# Patient Record
Sex: Female | Born: 1980 | Race: Black or African American | Hispanic: No | Marital: Single | Smoking: Never smoker
Health system: Southern US, Community
[De-identification: ages and names within clinical notes are randomized; demographics above are authoritative.]

## PROBLEM LIST (undated history)

## (undated) DIAGNOSIS — R569 Unspecified convulsions: Secondary | ICD-10-CM

## (undated) DIAGNOSIS — B20 Human immunodeficiency virus [HIV] disease: Secondary | ICD-10-CM

## (undated) DIAGNOSIS — K611 Rectal abscess: Secondary | ICD-10-CM

## (undated) DIAGNOSIS — N1330 Unspecified hydronephrosis: Secondary | ICD-10-CM

## (undated) DIAGNOSIS — Z5189 Encounter for other specified aftercare: Secondary | ICD-10-CM

## (undated) HISTORY — PX: OTHER SURGICAL HISTORY: SHX169

## (undated) HISTORY — DX: Human immunodeficiency virus (HIV) disease: B20

## (undated) HISTORY — DX: Encounter for other specified aftercare: Z51.89

## (undated) HISTORY — DX: Unspecified hydronephrosis: N13.30

## (undated) HISTORY — DX: Rectal abscess: K61.1

## (undated) HISTORY — DX: Unspecified convulsions: R56.9

---

## 2019-04-04 ENCOUNTER — Emergency Department (HOSPITAL_COMMUNITY)
Admission: EM | Admit: 2019-04-04 | Discharge: 2019-04-05 | Disposition: A | Discharge status: Home / Self Care | Payer: No Typology Code available for payment source | Attending: Emergency Medicine | Admitting: Emergency Medicine

## 2019-04-04 ENCOUNTER — Encounter (HOSPITAL_COMMUNITY): Payer: Self-pay

## 2019-04-04 ENCOUNTER — Other Ambulatory Visit: Payer: Self-pay

## 2019-04-04 DIAGNOSIS — Y9389 Activity, other specified: Secondary | ICD-10-CM | POA: Insufficient documentation

## 2019-04-04 DIAGNOSIS — Y9289 Other specified places as the place of occurrence of the external cause: Secondary | ICD-10-CM | POA: Insufficient documentation

## 2019-04-04 DIAGNOSIS — S0502XA Injury of conjunctiva and corneal abrasion without foreign body, left eye, initial encounter: Secondary | ICD-10-CM | POA: Diagnosis not present

## 2019-04-04 DIAGNOSIS — W208XXA Other cause of strike by thrown, projected or falling object, initial encounter: Secondary | ICD-10-CM | POA: Insufficient documentation

## 2019-04-04 DIAGNOSIS — Y990 Civilian activity done for income or pay: Secondary | ICD-10-CM | POA: Diagnosis not present

## 2019-04-04 DIAGNOSIS — S0592XA Unspecified injury of left eye and orbit, initial encounter: Secondary | ICD-10-CM | POA: Diagnosis present

## 2019-04-04 NOTE — ED Triage Notes (Signed)
Pt complains of an eye injury at work, this is a workers comp case Pt states a box fell on her left eye

## 2019-04-05 MED ORDER — ERYTHROMYCIN 5 MG/GM OP OINT: TOPICAL_OINTMENT | OPHTHALMIC | 0 refills | Status: DC

## 2019-04-05 MED ORDER — TETRACAINE HCL 0.5 % OP SOLN
1.0000 [drp] | Freq: Once | OPHTHALMIC | Status: AC
  Administered 2019-04-05: 1 [drp] via OPHTHALMIC
  Filled 2019-04-05: qty 4

## 2019-04-05 MED ORDER — FLUORESCEIN SODIUM 1 MG OP STRP
1.0000 | ORAL_STRIP | Freq: Once | OPHTHALMIC | Status: AC
  Administered 2019-04-05: 1 via OPHTHALMIC
  Filled 2019-04-05: qty 1

## 2019-04-05 NOTE — Discharge Instructions (Signed)
Use the drops as prescribed. Follow up with Opthalmology in 3 days for reevaluation. Return for new or worsening symptoms.

## 2019-04-05 NOTE — ED Provider Notes (Signed)
[facility] DEPT Provider Note   CSN: [id number] Arrival date & time: 04/04/19  2318    History   Chief Complaint Eye injury  HPI Candace Jones is a 38 y.o. female with no significant past medical record who presents to evaluation of eye injury. Reached up at work and a 5 lb box fell onto her left head/eye.  Patient states since then she has had a scratchy sensation to her left eye.  Injury occurred on Monday, 4 days PTA.  She denies any decreased vision.  She has had some eye tearing.  She does not wear contacts.  She does wear occasional corrective lenses.  Denies sensation of curtain in eye, erythema, pruritic discharge, double vision, HA, facial droop, facial pain, neck pain, neck stiffness.  Denies additional aggravating or alleviating factors. Tdap up to date.  History obtained from patient and past medical records. [employer] [place] interpretor was used.     HPI  History reviewed. No pertinent past medical history.  There are no active problems to display for this patient.   History reviewed. No pertinent surgical history.   OB History   No obstetric history on file.      Home Medications    Prior to Admission medications   Medication Sig Start Date End Date Taking? Authorizing Provider  erythromycin ophthalmic ointment Place a 1/2 inch ribbon of ointment into the LEFT lower eyelid. 04/05/19   Henderly, Britni A, PA-C    Family History History reviewed. No pertinent family history.  Social History Social History   Tobacco Use  . Smoking status: Never Smoker  . Smokeless tobacco: Never Used  Substance Use Topics  . Alcohol use: Never    Frequency: Never  . Drug use: Never     Allergies   Patient has no known allergies.   Review of Systems Review of Systems  Constitutional: Negative.   HENT: Negative.   Eyes: Positive for pain and discharge.  Respiratory: Negative.   Cardiovascular: Negative.   Gastrointestinal:  Negative.   Genitourinary: Negative.   Musculoskeletal: Negative.   Skin: Negative.   Neurological: Negative.   All other systems reviewed and are negative.    Physical Exam Updated Vital Signs BP (!) 138/97   Pulse 69   Temp 98.8 F (37.1 C) (Oral)   Resp 18   Ht 5' 8.11" (1.73 m)   Wt 104.3 kg   LMP 03/26/2019   SpO2 98%   BMI 34.86 kg/m   Physical Exam Vitals signs and nursing note reviewed.  Constitutional:      General: She is not in acute distress.    Appearance: She is well-developed. She is not ill-appearing or toxic-appearing.  HENT:     Head: Normocephalic and atraumatic. No raccoon eyes, Battle's sign, contusion, right periorbital erythema or left periorbital erythema.     Jaw: There is normal jaw occlusion.     Comments: Facial bones nontender to palpation.  No facial crepitus.    Mouth/Throat:     Lips: Pink.     Pharynx: Oropharynx is clear. Uvula midline.  Eyes:     General: Lids are normal. Lids are everted, no foreign bodies appreciated. Vision grossly intact. No allergic shiner, visual field deficit or scleral icterus.       Right eye: No foreign body, discharge or hordeolum.        Left eye: No foreign body, discharge or hordeolum.     Intraocular pressure: Right eye pressure is 14 mmHg.  Left eye pressure is 14 mmHg.     Extraocular Movements: Extraocular movements intact.     Conjunctiva/sclera:     Right eye: Right conjunctiva is not injected. No chemosis, exudate or hemorrhage.    Left eye: Left conjunctiva is not injected. No chemosis, exudate or hemorrhage.    Pupils: Pupils are equal, round, and reactive to light.     Comments: Small, 3 mm corneal abrasion to the 5 o'clock position on iris. No overlying skin changes to orbits/face. No pain with EOM.  Neck:     Musculoskeletal: Normal range of motion.  Cardiovascular:     Rate and Rhythm: Normal rate.  Pulmonary:     Effort: No respiratory distress.  Abdominal:     General: There is no  distension.  Musculoskeletal: Normal range of motion.  Skin:    General: Skin is warm and dry.  Neurological:     Mental Status: She is alert.    ED Treatments / Results  Labs (all labs ordered are listed, but only abnormal results are displayed) Labs Reviewed - No data to display  EKG None  Radiology No results found.  Procedures Procedures (including critical care time)  Medications Ordered in ED Medications  tetracaine (PONTOCAINE) 0.5 % ophthalmic solution 1 drop (has no administration in time range)  fluorescein ophthalmic strip 1 strip (has no administration in time range)   Initial Impression / Assessment and Plan / ED Course  I have reviewed the triage vital signs and the nursing notes.  Pertinent labs & imaging results that were available during my care of the patient were reviewed by me and considered in my medical decision making (see chart for details).  38 year old presents for evaluation of eye injury which occurred 4 days PTA.  Vision intact.  Exam consistent with detached retina.  She does have a small corneal abrasion on PE. No facial bony pain or stepoffs. Do not think patient needs CT max face at this time.  Pt with corneal abrasion on PE. Tdap up to date. Eye irrigated w NS, no evidence of FB.  No change in vision, acuity equal bilaterally.  Pt is not a contact lens wearer.  Exam non-concerning for orbital cellulitis, hyphema, corneal ulcers. Patient will be discharged home with erythromycin.   Patient understands to follow up with ophthalmology, & to return to ER if new symptoms develop including change in vision, purulent drainage, or entrapment.  The patient has been appropriately medically screened and/or stabilized in the ED. I have low suspicion for any other emergent medical condition which would require further screening, evaluation or treatment in the ED or require inpatient management.  Patient is hemodynamically stable and in no acute distress.   Patient able to ambulate in department prior to ED.  Evaluation does not show acute pathology that would require ongoing or additional emergent interventions while in the emergency department or further inpatient treatment.  I have discussed the diagnosis with the patient and answered all questions.  Pain is been managed while in the emergency department and patient has no further complaints prior to discharge.  Patient is comfortable with plan discussed in room and is stable for discharge at this time.  I have discussed strict return precautions for returning to the emergency department.  Patient was encouraged to follow-up with PCP/specialist refer to at discharge.      Final Clinical Impressions(s) / ED Diagnoses   Final diagnoses:  Abrasion of left cornea, initial encounter  ED Discharge Orders         Ordered    erythromycin ophthalmic ointment     04/05/19 0117           Henderly, Britni A, PA-C 04/05/19 0121    Palumbo, April, MD 04/05/19 0139

## 2019-09-17 ENCOUNTER — Emergency Department (HOSPITAL_COMMUNITY): Payer: 59

## 2019-09-17 ENCOUNTER — Emergency Department (HOSPITAL_COMMUNITY)
Admission: EM | Admit: 2019-09-17 | Discharge: 2019-09-17 | Disposition: A | Discharge status: Home / Self Care | Payer: 59 | Attending: Emergency Medicine | Admitting: Emergency Medicine

## 2019-09-17 ENCOUNTER — Encounter (HOSPITAL_COMMUNITY): Payer: Self-pay

## 2019-09-17 ENCOUNTER — Other Ambulatory Visit: Payer: Self-pay

## 2019-09-17 DIAGNOSIS — M25571 Pain in right ankle and joints of right foot: Secondary | ICD-10-CM

## 2019-09-17 DIAGNOSIS — M25511 Pain in right shoulder: Secondary | ICD-10-CM | POA: Diagnosis present

## 2019-09-17 MED ORDER — METHOCARBAMOL 500 MG PO TABS: 500.0000 mg | ORAL_TABLET | Freq: Two times a day (BID) | ORAL | 0 refills | Status: DC

## 2019-09-17 MED ORDER — NAPROXEN 500 MG PO TABS: 500.0000 mg | ORAL_TABLET | Freq: Two times a day (BID) | ORAL | 0 refills | Status: DC

## 2019-09-17 NOTE — Discharge Instructions (Addendum)
Your shoulder xray did not show any abnormalities.   Your ankle xray was normal.  Your foot xray does show some likely chronic changes however they cannot fully rule out a new small fracture. This is not specifically where you are having pain and may just be an incidental finding from an old injury. I would recommend following up with orthopedist Dr. Lucia Gaskins for further evaluation if you continue to have pain in 2-3 weeks.   I have prescribed medication for you to take. Please take to any pharmacy to have it filled. I would not recommend driving while taking the Methocarbamol (Robaxin) as it can make you drowsy. You should take the Naproxen during the day and the Robaxin at nighttime to help you sleep. Your pain should begin to improve over the next couple of days.   Return to the ED for any worsening symptoms including worsening pain, inability to walk on your foot, or any other concerning symptoms.
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
## 2019-09-17 NOTE — ED Notes (Signed)
Pt verbalizes understanding of DC instructions. Pt belongings returned and is ambulatory out of ED.   Interpreter used. Pt placed in room with her other daughter whose visit  is still in process.
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
## 2019-09-17 NOTE — ED Notes (Signed)
Pt placed back in process awaiting ortho consult

## 2020-09-29 ENCOUNTER — Inpatient Hospital Stay (HOSPITAL_COMMUNITY)
Admission: EM | Admit: 2020-09-29 | Discharge: 2020-10-10 | DRG: 969 | Disposition: A | Discharge status: Home / Self Care | Payer: Medicaid Other | Attending: Internal Medicine | Admitting: Internal Medicine

## 2020-09-29 ENCOUNTER — Emergency Department (HOSPITAL_COMMUNITY): Payer: Medicaid Other

## 2020-09-29 ENCOUNTER — Encounter (HOSPITAL_COMMUNITY): Payer: Self-pay | Admitting: Internal Medicine

## 2020-09-29 ENCOUNTER — Observation Stay (HOSPITAL_COMMUNITY): Payer: Medicaid Other

## 2020-09-29 DIAGNOSIS — B589 Toxoplasmosis, unspecified: Principal | ICD-10-CM | POA: Diagnosis present

## 2020-09-29 DIAGNOSIS — R21 Rash and other nonspecific skin eruption: Secondary | ICD-10-CM | POA: Diagnosis present

## 2020-09-29 DIAGNOSIS — K644 Residual hemorrhoidal skin tags: Secondary | ICD-10-CM | POA: Diagnosis present

## 2020-09-29 DIAGNOSIS — G9389 Other specified disorders of brain: Secondary | ICD-10-CM

## 2020-09-29 DIAGNOSIS — G8191 Hemiplegia, unspecified affecting right dominant side: Secondary | ICD-10-CM | POA: Diagnosis present

## 2020-09-29 DIAGNOSIS — L299 Pruritus, unspecified: Secondary | ICD-10-CM | POA: Diagnosis present

## 2020-09-29 DIAGNOSIS — Z6830 Body mass index (BMI) 30.0-30.9, adult: Secondary | ICD-10-CM

## 2020-09-29 DIAGNOSIS — R29810 Facial weakness: Secondary | ICD-10-CM | POA: Diagnosis present

## 2020-09-29 DIAGNOSIS — R569 Unspecified convulsions: Secondary | ICD-10-CM

## 2020-09-29 DIAGNOSIS — Z21 Asymptomatic human immunodeficiency virus [HIV] infection status: Secondary | ICD-10-CM

## 2020-09-29 DIAGNOSIS — R102 Pelvic and perineal pain: Secondary | ICD-10-CM

## 2020-09-29 DIAGNOSIS — N920 Excessive and frequent menstruation with regular cycle: Secondary | ICD-10-CM | POA: Diagnosis present

## 2020-09-29 DIAGNOSIS — B582 Toxoplasma meningoencephalitis: Secondary | ICD-10-CM | POA: Diagnosis present

## 2020-09-29 DIAGNOSIS — W19XXXA Unspecified fall, initial encounter: Secondary | ICD-10-CM | POA: Diagnosis present

## 2020-09-29 DIAGNOSIS — Z791 Long term (current) use of non-steroidal anti-inflammatories (NSAID): Secondary | ICD-10-CM

## 2020-09-29 DIAGNOSIS — D496 Neoplasm of unspecified behavior of brain: Secondary | ICD-10-CM | POA: Diagnosis present

## 2020-09-29 DIAGNOSIS — Z79899 Other long term (current) drug therapy: Secondary | ICD-10-CM

## 2020-09-29 DIAGNOSIS — G9341 Metabolic encephalopathy: Secondary | ICD-10-CM | POA: Diagnosis present

## 2020-09-29 DIAGNOSIS — E669 Obesity, unspecified: Secondary | ICD-10-CM | POA: Diagnosis present

## 2020-09-29 DIAGNOSIS — R900 Intracranial space-occupying lesion found on diagnostic imaging of central nervous system: Secondary | ICD-10-CM

## 2020-09-29 DIAGNOSIS — Z20822 Contact with and (suspected) exposure to covid-19: Secondary | ICD-10-CM | POA: Diagnosis present

## 2020-09-29 DIAGNOSIS — Z5309 Procedure and treatment not carried out because of other contraindication: Secondary | ICD-10-CM

## 2020-09-29 DIAGNOSIS — K6289 Other specified diseases of anus and rectum: Secondary | ICD-10-CM

## 2020-09-29 DIAGNOSIS — B20 Human immunodeficiency virus [HIV] disease: Secondary | ICD-10-CM | POA: Diagnosis present

## 2020-09-29 DIAGNOSIS — K611 Rectal abscess: Secondary | ICD-10-CM

## 2020-09-29 DIAGNOSIS — B370 Candidal stomatitis: Secondary | ICD-10-CM | POA: Diagnosis present

## 2020-09-29 DIAGNOSIS — M25579 Pain in unspecified ankle and joints of unspecified foot: Secondary | ICD-10-CM

## 2020-09-29 DIAGNOSIS — R001 Bradycardia, unspecified: Secondary | ICD-10-CM | POA: Diagnosis present

## 2020-09-29 DIAGNOSIS — E876 Hypokalemia: Secondary | ICD-10-CM | POA: Diagnosis present

## 2020-09-29 DIAGNOSIS — N858 Other specified noninflammatory disorders of uterus: Secondary | ICD-10-CM

## 2020-09-29 DIAGNOSIS — G936 Cerebral edema: Secondary | ICD-10-CM | POA: Diagnosis present

## 2020-09-29 DIAGNOSIS — M25571 Pain in right ankle and joints of right foot: Secondary | ICD-10-CM | POA: Diagnosis present

## 2020-09-29 DIAGNOSIS — D259 Leiomyoma of uterus, unspecified: Secondary | ICD-10-CM | POA: Diagnosis present

## 2020-09-29 DIAGNOSIS — R471 Dysarthria and anarthria: Secondary | ICD-10-CM | POA: Diagnosis present

## 2020-09-29 DIAGNOSIS — D509 Iron deficiency anemia, unspecified: Secondary | ICD-10-CM | POA: Diagnosis present

## 2020-09-29 DIAGNOSIS — R4701 Aphasia: Secondary | ICD-10-CM | POA: Diagnosis present

## 2020-09-29 LAB — CBC
HCT: 24.3 % — ABNORMAL LOW (ref 36.0–46.0)
Hemoglobin: 6.6 g/dL — CL (ref 12.0–15.0)
MCH: 16.7 pg — ABNORMAL LOW (ref 26.0–34.0)
MCHC: 27.2 g/dL — ABNORMAL LOW (ref 30.0–36.0)
MCV: 61.5 fL — ABNORMAL LOW (ref 80.0–100.0)
Platelets: 213 10*3/uL (ref 150–400)
RBC: 3.95 MIL/uL (ref 3.87–5.11)
RDW: 23.7 % — ABNORMAL HIGH (ref 11.5–15.5)
WBC: 4.1 10*3/uL (ref 4.0–10.5)
nRBC: 0 % (ref 0.0–0.2)

## 2020-09-29 LAB — COMPREHENSIVE METABOLIC PANEL
ALT: 27 U/L (ref 0–44)
AST: 41 U/L (ref 15–41)
Albumin: 3.6 g/dL (ref 3.5–5.0)
Alkaline Phosphatase: 66 U/L (ref 38–126)
Anion gap: 9 (ref 5–15)
BUN: 10 mg/dL (ref 6–20)
CO2: 19 mmol/L — ABNORMAL LOW (ref 22–32)
Calcium: 8.7 mg/dL — ABNORMAL LOW (ref 8.9–10.3)
Chloride: 105 mmol/L (ref 98–111)
Creatinine, Ser: 0.57 mg/dL (ref 0.44–1.00)
GFR, Estimated: >60 mL/min (ref >60)
Glucose, Bld: 114 mg/dL — ABNORMAL HIGH (ref 70–99)
Potassium: 3.5 mmol/L (ref 3.5–5.1)
Sodium: 133 mmol/L — ABNORMAL LOW (ref 135–145)
Total Bilirubin: 0.6 mg/dL (ref 0.3–1.2)
Total Protein: 9.7 g/dL — ABNORMAL HIGH (ref 6.5–8.1)

## 2020-09-29 LAB — DIFFERENTIAL
Abs Immature Granulocytes: 0.01 10*3/uL (ref 0.00–0.07)
Basophils Absolute: 0 10*3/uL (ref 0.0–0.1)
Basophils Relative: 1 %
Eosinophils Absolute: 0.3 10*3/uL (ref 0.0–0.5)
Eosinophils Relative: 6 %
Immature Granulocytes: 0 %
Lymphocytes Relative: 40 %
Lymphs Abs: 1.6 10*3/uL (ref 0.7–4.0)
Monocytes Absolute: 0.4 10*3/uL (ref 0.1–1.0)
Monocytes Relative: 10 %
Neutro Abs: 1.8 10*3/uL (ref 1.7–7.7)
Neutrophils Relative %: 43 %

## 2020-09-29 LAB — I-STAT CHEM 8, ED
BUN: 10 mg/dL (ref 6–20)
Calcium, Ion: 1.07 mmol/L — ABNORMAL LOW (ref 1.15–1.40)
Chloride: 106 mmol/L (ref 98–111)
Creatinine, Ser: 0.5 mg/dL (ref 0.44–1.00)
Glucose, Bld: 111 mg/dL — ABNORMAL HIGH (ref 70–99)
HCT: 25 % — ABNORMAL LOW (ref 36.0–46.0)
Hemoglobin: 8.5 g/dL — ABNORMAL LOW (ref 12.0–15.0)
Potassium: 3.5 mmol/L (ref 3.5–5.1)
Sodium: 139 mmol/L (ref 135–145)
TCO2: 21 mmol/L — ABNORMAL LOW (ref 22–32)

## 2020-09-29 LAB — CBG MONITORING, ED
Glucose-Capillary: 107 mg/dL — ABNORMAL HIGH (ref 70–99)
Glucose-Capillary: 90 mg/dL (ref 70–99)

## 2020-09-29 LAB — I-STAT BETA HCG BLOOD, ED (MC, WL, AP ONLY): I-stat hCG, quantitative: <5 m[IU]/mL (ref <5)

## 2020-09-29 LAB — APTT: aPTT: 23 seconds — ABNORMAL LOW (ref 24–36)

## 2020-09-29 LAB — PREPARE RBC (CROSSMATCH)

## 2020-09-29 LAB — ABO/RH: ABO/RH(D): A POS

## 2020-09-29 LAB — PROTIME-INR
INR: 1.1 (ref 0.8–1.2)
Prothrombin Time: 14.3 seconds (ref 11.4–15.2)

## 2020-09-29 MED ORDER — ACETAMINOPHEN 650 MG RE SUPP: 650.0000 mg | Freq: Four times a day (QID) | RECTAL | Status: DC | PRN

## 2020-09-29 MED ORDER — IOHEXOL 350 MG/ML SOLN
100.0000 mL | Freq: Once | INTRAVENOUS | Status: AC | PRN
  Administered 2020-09-29: 100 mL via INTRAVENOUS

## 2020-09-29 MED ORDER — DEXAMETHASONE SODIUM PHOSPHATE 4 MG/ML IJ SOLN: 4.0000 mg | Freq: Four times a day (QID) | INTRAMUSCULAR | Status: DC

## 2020-09-29 MED ORDER — ACETAMINOPHEN 325 MG PO TABS: 650.0000 mg | ORAL_TABLET | Freq: Four times a day (QID) | ORAL | Status: DC | PRN

## 2020-09-29 MED ORDER — IOHEXOL 300 MG/ML  SOLN
70.0000 mL | Freq: Once | INTRAMUSCULAR | Status: AC | PRN
  Administered 2020-09-29: 70 mL via INTRAVENOUS

## 2020-09-29 MED ORDER — LEVETIRACETAM IN NACL 1500 MG/100ML IV SOLN
1500.0000 mg | Freq: Once | INTRAVENOUS | Status: AC
  Administered 2020-09-29: 1500 mg via INTRAVENOUS
  Filled 2020-09-29: qty 100

## 2020-09-29 MED ORDER — SODIUM CHLORIDE 0.9% FLUSH
3.0000 mL | Freq: Once | INTRAVENOUS | Status: AC
Start: 2020-09-29 — End: 2020-10-06
  Administered 2020-10-06: 3 mL via INTRAVENOUS

## 2020-09-29 MED ORDER — DEXAMETHASONE SODIUM PHOSPHATE 10 MG/ML IJ SOLN
6.0000 mg | Freq: Four times a day (QID) | INTRAMUSCULAR | Status: AC
  Administered 2020-09-29 – 2020-09-30 (×4): 6 mg via INTRAVENOUS
  Filled 2020-09-29 (×2): qty 0.6
  Filled 2020-09-29: qty 1
  Filled 2020-09-29: qty 0.6

## 2020-09-29 MED ORDER — LORAZEPAM 2 MG/ML IJ SOLN
INTRAMUSCULAR | Status: AC
  Administered 2020-09-29: 2 mg
  Filled 2020-09-29: qty 1

## 2020-09-29 MED ORDER — SODIUM CHLORIDE 0.9% IV SOLUTION: Freq: Once | INTRAVENOUS | Status: AC

## 2020-09-29 MED ORDER — SODIUM CHLORIDE 0.9 % IV SOLN
750.0000 mg | Freq: Two times a day (BID) | INTRAVENOUS | Status: DC
  Administered 2020-09-30 – 2020-10-04 (×10): 750 mg via INTRAVENOUS
  Filled 2020-09-29 (×15): qty 7.5

## 2020-09-29 NOTE — ED Notes (Signed)
Dr. Kakrakandy at bedside. 

## 2020-09-29 NOTE — ED Notes (Addendum)
Pt is still disoriented and unable to verbalize answers. Per neuro assessment pt is unable to speak, tell me her name, year, etc. Pt daughter at bedside. Is able to speak some english but is not able to make health care decisions. No eligible person at bedside to help pt consent. POA is en route to hospital but is 4 hours away. Pt taken to xray.

## 2020-09-29 NOTE — Consult Note (Addendum)
Neurology Consultation  Reason for Consult: Aphasia Referring Physician: Dr. Dina Rich  CC: Aphasia  History is obtained from: EMS, Patient daughter  HPI: Candace Jones is a 40 y.o. French-speaking female with an unknown medical history who presents for evaluation of aphasia and right arm weakness. Per patient's daughter, Candace Jones went to [employer] at 16:45 today at baseline mental and functional status and arrived home at 17:05 with aphasia and unsteady, rightward leaning gait, and she was confused not able to state where she was. Daughter endorses that Candace Jones had complained of a headache approximately 2 weeks ago but she has not heard any complaints since. Candace Jones daughter also states that when her mother speaks, the words just do not make sense as she attempts to interpret for medical staff and states that her mother does have blood pressure problems but is unsure whether her blood pressures are normally high or low.  Daughter states that the patient is very private about her medical issues and although they live in the same home she is unsure if she takes any other medications or has any other medical problems.  Daughter does note that the patient occasionally takes some medication for headache  LKW: 16:45 tpa given?: no, CT head with concern for tumor IR Thrombectomy? No, low suspicion for LVO, mass / tumor identified on CTH Modified Rankin Scale: 0-Completely asymptomatic and back to baseline post- stroke  ROS: Unable to obtain due to altered mental status.   No past medical history on file.  No family history on file.  Social History:   reports that she has never smoked. She has never used smokeless tobacco. She reports that she does not drink alcohol and does not use drugs.  Medications  Current Facility-Administered Medications:  .  sodium chloride flush (NS) 0.9 % injection 3 mL, 3 mL, Intravenous, Once, Horton, Kristie M, DO  Current Outpatient Medications:  .   erythromycin ophthalmic ointment, Place a 1/2 inch ribbon of ointment into the LEFT lower eyelid., Disp: 1 g, Rfl: 0 .  methocarbamol (ROBAXIN) 500 MG tablet, Take 1 tablet (500 mg total) by mouth 2 (two) times daily., Disp: 20 tablet, Rfl: 0 .  naproxen (NAPROSYN) 500 MG tablet, Take 1 tablet (500 mg total) by mouth 2 (two) times daily., Disp: 30 tablet, Rfl: 0  Exam: Current vital signs: BP 128/82   Pulse 87   Temp 99.1 F (37.3 C) (Oral)   Resp 18   SpO2 100%  Vital signs in last 24 hours: Temp:  [99.1 F (37.3 C)] 99.1 F (37.3 C) (04/18 1852) Pulse Rate:  [87] 87 (04/18 1852) Resp:  [18] 18 (04/18 1852) BP: (128)/(82) 128/82 (04/18 1852) SpO2:  [100 %] 100 % (04/18 1852)  GENERAL: Awake, alert, in no acute distress Psych: Appears anxious during examination, cooperative with assessment Head: Normocephalic and atraumatic without obvious abnormality EENT: No OP obstruction, normal conjunctiv LUNGS: Normal respiratory effort. Non-labored breathing CV: Regular rate and rhythm on cardiac monitor ABDOMEN: Soft, non-tender, non-distended Ext: warm, well perfused, no obvious abnormality  NEURO:  Mental Status: Awake and alert. Does not answer orientation questions. Per patient's daughter and interpreter, patient does not provide any usable speech outside of "ouch, everything hurts" once. She does not answer name, place, time, age, month, or situation. Per interpreter, language is severely aphasic but is not described as dysarthric. She is unable to name objects. No neglect noted Cranial Nerves:  II: PERRL 6 mm/brisk. Attends to stimuli in all  visual fields III, IV, VI: EOMI. Lid elevation symmetric and full.  V: Sensation is intact to light touch and symmetrical to face. (Patient nods yes to symmetric feeling) VII: Right sided mouth droop present  VIII: Hearing is intact to voice IX, X: Palate elevation is symmetric. Hypophonic.  XI: Normal sternocleidomastoid and trapezius muscle  strength XII: Tongue protrudes midline without fasciculations.   Motor: 5/5 strength is all muscle groups with constant coaching. All extremities with antigravity movement and without vertical drift. Tone is normal. Bulk is normal.  Sensation: Withdrawals equally to noxious stimuli throughout. Does not provide information about sensation to light touch.  Coordination: FTN intact bilaterally. HKS intact bilaterally (assessment requires constant coaching) No pronator drift.  DTRs: 2+ and symmetric biceps, 3+ symmetric patellae Plantars: Left: up going Right: downgoing Gait: deferred  NIHSS: 1a Level of Conscious.: 0 1b LOC Questions: 2 1c LOC Commands: 0 2 Best Gaze: 0 3 Visual: 0 4 Facial Palsy: 1 5a Motor Arm - left: 0 5b Motor Arm - Right: 0 6a Motor Leg - Left: 0 6b Motor Leg - Right: 0 7 Limb Ataxia: 0 8 Sensory: 0 9 Best Language: 0 10 Dysarthria: 2 11 Extinct. and Inatten.: 0 TOTAL: 5  Labs I have reviewed labs in epic and the results pertinent to this consultation are: CBC    Component Value Date/Time   WBC 4.1 09/29/2020 1814   RBC 3.95 09/29/2020 1814   HGB 8.5 (L) 09/29/2020 1822   HCT 25.0 (L) 09/29/2020 1822   PLT 213 09/29/2020 1814   MCV 61.5 (L) 09/29/2020 1814   MCH 16.7 (L) 09/29/2020 1814   MCHC 27.2 (L) 09/29/2020 1814   RDW 23.7 (H) 09/29/2020 1814   CMP     Component Value Date/Time   NA 139 09/29/2020 1822   K 3.5 09/29/2020 1822   CL 106 09/29/2020 1822   CO2 19 (L) 09/29/2020 1814   GLUCOSE 111 (H) 09/29/2020 1822   BUN 10 09/29/2020 1822   CREATININE 0.50 09/29/2020 1822   CALCIUM 8.7 (L) 09/29/2020 1814   PROT 9.7 (H) 09/29/2020 1814   ALBUMIN 3.6 09/29/2020 1814   AST 41 09/29/2020 1814   ALT 27 09/29/2020 1814   ALKPHOS 66 09/29/2020 1814   BILITOT 0.6 09/29/2020 1814   GFRNONAA >60 09/29/2020 1814   Imaging I have reviewed the images obtained: CT-scan of the brain: 1. Moderate amount of white matter edema in the left frontal  and temporal lobe with sparing of cortex. Pattern is most likely due to tumor. Possible metastatic disease or primary brain tumor. Recommend MRI brain without and with contrast  Assessment: 40 year old female who presents via EMS for acute onset of confusion, right facial droop, and aphasia. Initial imaging concerning for mass / tumor with edema in the left frontal and temporal lobes.  - Examination reveals persistent aphasia and right-sided mouth droop.  - CT with concern for metastatic or primary brain tumor. MRI brain with and without contrast pending for further evaluation.  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 Will obtain EEG to rule out seizures.   Recommendations: - MRI brain with and without contrast - Frequent neuro checks  - Recommend neurosurgery consult for management of brain tumor - Routine EEG; consider Keppra with evidence of seizure activity  -Patient received 1500 mg loading dose of Keppra after seizure activity in the ED  -Attending additionally ordered 750 mg twice daily - Neurology will follow up on EEG  Anibal Henderson, AGAC-NP [facility] Pager: [phone]  Attending Neurologist's note:  I personally saw this patient, gathering history, performing a full neurologic examination, reviewing relevant labs, personally reviewing relevant imaging including Head CT, and formulated the assessment and plan, adding the note above for completeness and clarity to accurately reflect my thoughts

## 2020-09-29 NOTE — ED Notes (Signed)
Pt appeared to by having a focal seizure that generalized. Dr Metta Clines at bedside. 2mg  of Ativan ordered and given, appeared to to have broken seizure. Pt is postictal at this time, suction readied at bedside, pt protecting airway at this time, O2 100 on RA. Pt placed on 1.5 L Beatrice for comfort. Danae Chen, oncoming RN at bedside as well.

## 2020-09-29 NOTE — ED Provider Notes (Signed)
[facility] EMERGENCY DEPARTMENT Provider Note   CSN: [id number] Arrival date & time: 09/29/20  1810  An emergency department physician performed an initial assessment on this suspected stroke Candace Jones at 1815.  History No chief complaint on file.   Candace Jones is a 40 y.o. female.  HPI  Candace Jones is a 40 year old female with no known medical history presenting with a chief complaint of right-sided facial droop, aphasia, left-sided body weakness earlier today that seems to be improving upon arrival.  However Candace Jones treated as a tier 1 code stroke.  Candace Jones at bedside provides most of the history states Candace Jones has otherwise been doing well until sudden onset 1 hour prior to arrival.  Candace Jones is a [place] only speaker and is confused on arrival here.  Most of the history is gained by the Candace Jones's Candace Jones via a [place] interpreter via the phone who is in route. The Candace Jones states that she has been complaining of weakness and fatigue over the past 2 weeks with sudden onset today of confusion.  She is otherwise healthy does not take any medicines on a normal day.  Candace Jones's Candace Jones is currently out of the country and unreachable.  History reviewed. No pertinent past medical history.  Candace Jones Active Problem List   Diagnosis Date Noted  . 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 Seizure ([place]) 09/29/2020  . Microcytic hypochromic anemia 09/29/2020    History reviewed. No pertinent surgical history.   OB History   No obstetric history on file.     Family History  Family history unknown: Yes    Social History   Tobacco Use  . Smoking status: Never Smoker  . Smokeless tobacco: Never Used  Substance Use Topics  . Alcohol use: Never  . Drug use: Never    Home Medications Prior to Admission medications   Medication Sig Start Date End Date Taking? Authorizing Provider  erythromycin ophthalmic ointment Place a 1/2 inch ribbon of ointment into the LEFT lower eyelid.  04/05/19   Henderly, Britni A, PA-C  methocarbamol (ROBAXIN) 500 MG tablet Take 1 tablet (500 mg total) by mouth 2 (two) times daily. 09/17/19   Alroy Bailiff, Margaux, PA-C  naproxen (NAPROSYN) 500 MG tablet Take 1 tablet (500 mg total) by mouth 2 (two) times daily. 09/17/19   Eustaquio Maize, PA-C    Allergies    Candace Jones has no known allergies.  Review of Systems   Review of Systems  Constitutional: Positive for fatigue. Negative for chills and fever.  HENT: Negative for ear pain and sore throat.   Eyes: Negative for pain and visual disturbance.  Respiratory: Negative for cough and shortness of breath.   Cardiovascular: Negative for chest pain and palpitations.  Gastrointestinal: Negative for abdominal pain and vomiting.  Genitourinary: Negative for dysuria and hematuria.  Musculoskeletal: Negative for arthralgias and back pain.  Skin: Negative for color change and rash.  Neurological: Positive for weakness. Negative for seizures and syncope.  All other systems reviewed and are negative.   Physical Exam Updated Vital Signs BP 131/83   Pulse 79   Temp 99.1 F (37.3 C) (Oral)   Resp (!) 27   Wt 89.8 kg   SpO2 100%   BMI 30.00 kg/m   Physical Exam Vitals and nursing note reviewed.  Constitutional:      General: She is not in acute distress.    Appearance: She is well-developed.  HENT:     Head: Normocephalic and atraumatic.  Eyes:     Conjunctiva/sclera: Conjunctivae  normal.  Cardiovascular:     Rate and Rhythm: Normal rate and regular rhythm.     Heart sounds: No murmur heard.   Pulmonary:     Effort: Pulmonary effort is normal. No respiratory distress.     Breath sounds: Normal breath sounds.  Abdominal:     General: There is no distension.     Palpations: Abdomen is soft.     Tenderness: There is no abdominal tenderness. There is no right CVA tenderness or left CVA tenderness.  Musculoskeletal:        General: No swelling or tenderness. Normal range of motion.      Cervical back: Neck supple.  Skin:    General: Skin is warm and dry.  Neurological:     Mental Status: She is alert. She is disoriented.     Comments: Arrival GCS 14.  Candace Jones unable to speak clearly due to confusion but without gross aphasia or focal abnormalities.  Candace Jones able to follow commands initially.     ED Results / Procedures / Treatments   Labs (all labs ordered are listed, but only abnormal results are displayed) Labs Reviewed  APTT - Abnormal; Notable for the following components:      Result Value   aPTT 23 (*)    All other components within normal limits  CBC - Abnormal; Notable for the following components:   Hemoglobin 6.6 (*)    HCT 24.3 (*)    MCV 61.5 (*)    MCH 16.7 (*)    MCHC 27.2 (*)    RDW 23.7 (*)    All other components within normal limits  COMPREHENSIVE METABOLIC PANEL - Abnormal; Notable for the following components:   Sodium 133 (*)    CO2 19 (*)    Glucose, Bld 114 (*)    Calcium 8.7 (*)    Total Protein 9.7 (*)    All other components within normal limits  I-STAT CHEM 8, ED - Abnormal; Notable for the following components:   Glucose, Bld 111 (*)    Calcium, Ion 1.07 (*)    TCO2 21 (*)    Hemoglobin 8.5 (*)    HCT 25.0 (*)    All other components within normal limits  CBG MONITORING, ED - Abnormal; Notable for the following components:   Glucose-Capillary 107 (*)    All other components within normal limits  PROTIME-INR  DIFFERENTIAL  HIV ANTIBODY (ROUTINE TESTING W REFLEX)  COMPREHENSIVE METABOLIC PANEL  CBC WITH DIFFERENTIAL/PLATELET  I-STAT BETA HCG BLOOD, ED ([facility], [facility], [facility] ONLY)  CBG MONITORING, ED  TYPE AND SCREEN  PREPARE RBC (CROSSMATCH)  ABO/RH    EKG EKG Interpretation  Date/Time:  Monday September 29 2020 18:51:45 EDT Ventricular Rate:  83 PR Interval:  175 QRS Duration: 82 QT Interval:  358 QTC Calculation: 421 R Axis:   50 Text Interpretation: Sinus rhythm Normal sinus rhythm Confirmed by Lavenia Atlas [phone]) on  09/29/2020 7:06:25 PM   Radiology CT ANGIO HEAD W OR WO CONTRAST  Result Date: 09/29/2020 CLINICAL DATA:  Acute neuro deficit, aphasia.  Right-sided deficit EXAM: CT ANGIOGRAPHY HEAD AND NECK CT PERFUSION BRAIN TECHNIQUE: Multidetector CT imaging of the head and neck was performed using the standard protocol during bolus administration of intravenous contrast. Multiplanar CT image reconstructions and MIPs were obtained to evaluate the vascular anatomy. Carotid stenosis measurements (when applicable) are obtained utilizing NASCET criteria, using the distal internal carotid diameter as the denominator. Multiphase CT imaging of the brain was performed following  IV bolus contrast injection. Subsequent parametric perfusion maps were calculated using RAPID software. CONTRAST:  128mL OMNIPAQUE IOHEXOL 350 MG/ML SOLN COMPARISON:  CT head 09/29/2020 FINDINGS: CTA NECK FINDINGS Aortic arch: Standard branching. Imaged portion shows no evidence of aneurysm or dissection. No significant stenosis of the major arch vessel origins. Right carotid system: Normal right carotid. Negative for atherosclerotic disease. Left carotid system: Negative left carotid. Negative for atherosclerotic disease or stenosis. Vertebral arteries: Normal vertebral arteries bilaterally. Skeleton: No acute skeletal abnormality. Other neck: Negative for mass or enlarged lymph node. Upper chest: Mild ground-glass density in the right upper lobe without focal infiltrate or mass. Review of the MIP images confirms the above findings CTA HEAD FINDINGS Anterior circulation: Normal anterior circulation. Negative for stenosis or large vessel occlusion. Posterior circulation: Normal posterior circulation. Negative for stenosis or large vessel occlusion. Venous sinuses: Normal venous enhancement. Anatomic variants: None Review of the MIP images confirms the above findings CT Brain Perfusion Findings: ASPECTS: 10 CBF (<30%) Volume: 33mL Perfusion (Tmax>6.0s)  volume: 16mL Mismatch Volume: 46mL Infarction Location:Patchy decreased perfusion in the left frontal lobe and high right frontal lobe. IMPRESSION: Normal CT angiogram head neck. No significant atherosclerotic disease. No intracranial large vessel occlusion CT perfusion demonstrates small patchy areas of diminished perfusion left frontal and right frontal lobe of uncertain significance. No fixed infarct. Electronically Signed   By: Franchot Gallo M.D.   On: 09/29/2020 18:55   CT ANGIO NECK W OR WO CONTRAST  Result Date: 09/29/2020 CLINICAL DATA:  Acute neuro deficit, aphasia.  Right-sided deficit EXAM: CT ANGIOGRAPHY HEAD AND NECK CT PERFUSION BRAIN TECHNIQUE: Multidetector CT imaging of the head and neck was performed using the standard protocol during bolus administration of intravenous contrast. Multiplanar CT image reconstructions and MIPs were obtained to evaluate the vascular anatomy. Carotid stenosis measurements (when applicable) are obtained utilizing NASCET criteria, using the distal internal carotid diameter as the denominator. Multiphase CT imaging of the brain was performed following IV bolus contrast injection. Subsequent parametric perfusion maps were calculated using RAPID software. CONTRAST:  172mL OMNIPAQUE IOHEXOL 350 MG/ML SOLN COMPARISON:  CT head 09/29/2020 FINDINGS: CTA NECK FINDINGS Aortic arch: Standard branching. Imaged portion shows no evidence of aneurysm or dissection. No significant stenosis of the major arch vessel origins. Right carotid system: Normal right carotid. Negative for atherosclerotic disease. Left carotid system: Negative left carotid. Negative for atherosclerotic disease or stenosis. Vertebral arteries: Normal vertebral arteries bilaterally. Skeleton: No acute skeletal abnormality. Other neck: Negative for mass or enlarged lymph node. Upper chest: Mild ground-glass density in the right upper lobe without focal infiltrate or mass. Review of the MIP images confirms the  above findings CTA HEAD FINDINGS Anterior circulation: Normal anterior circulation. Negative for stenosis or large vessel occlusion. Posterior circulation: Normal posterior circulation. Negative for stenosis or large vessel occlusion. Venous sinuses: Normal venous enhancement. Anatomic variants: None Review of the MIP images confirms the above findings CT Brain Perfusion Findings: ASPECTS: 10 CBF (<30%) Volume: 31mL Perfusion (Tmax>6.0s) volume: 3mL Mismatch Volume: 45mL Infarction Location:Patchy decreased perfusion in the left frontal lobe and high right frontal lobe. IMPRESSION: Normal CT angiogram head neck. No significant atherosclerotic disease. No intracranial large vessel occlusion CT perfusion demonstrates small patchy areas of diminished perfusion left frontal and right frontal lobe of uncertain significance. No fixed infarct. Electronically Signed   By: Franchot Gallo M.D.   On: 09/29/2020 18:55   CT CHEST ABDOMEN PELVIS W CONTRAST  Result Date: 09/29/2020 CLINICAL DATA:  Intracranial mass, rule  out primary lesion EXAM: CT CHEST, ABDOMEN, AND PELVIS WITH CONTRAST TECHNIQUE: Multidetector CT imaging of the chest, abdomen and pelvis was performed following the standard protocol during bolus administration of intravenous contrast. CONTRAST:  66mL OMNIPAQUE IOHEXOL 300 MG/ML  SOLN COMPARISON:  None. FINDINGS: CT CHEST FINDINGS Cardiovascular: Thoracic aorta and its branches are within normal limits. No aneurysmal dilatation is seen. The pulmonary artery as visualized is within normal limits. Heart is not significantly enlarged. Mediastinum/Nodes: Thoracic inlet is within normal limits. No hilar or mediastinal adenopathy is noted. The esophagus as visualized is within normal limits. Lungs/Pleura: The lungs are well aerated bilaterally. Mild dependent atelectatic changes are seen. No focal parenchymal nodule is noted. No sizable effusion or pneumothorax is noted. Musculoskeletal: Degenerative changes of the  thoracic spine are noted. No rib abnormality is seen. No specific breast mass is noted. CT ABDOMEN PELVIS FINDINGS Hepatobiliary: No focal liver abnormality is seen. No gallstones, gallbladder wall thickening, or biliary dilatation. Pancreas: Unremarkable. No pancreatic ductal dilatation or surrounding inflammatory changes. Spleen: Normal in size without focal abnormality. Adrenals/Urinary Tract: Adrenal glands are within normal limits. Kidneys demonstrate a normal enhancement pattern with normal excretion bilaterally. No obstructive changes are seen. Bladder is well distended with opacified urine consistent with prior contrast administration. Stomach/Bowel: No obstructive or inflammatory changes of the colon are noted. The appendix is well visualized and within normal limits. Small bowel and stomach appear within normal limits. Vascular/Lymphatic: No significant vascular findings are present. No enlarged abdominal or pelvic lymph nodes. Reproductive: Uterus is well visualized and demonstrates bulky enlargement with multifocal lesions. The largest of these measures at least 10 cm in dimension. These likely represent uterine fibroids although the possibility of a more aggressive lesion could not be totally excluded. Other: Adjacent to the rectum on the right there is a somewhat multiloculated cystic structure which measures approximately 4.6 by 3.0 cm. This is of uncertain etiology. Possibility of small perirectal abscess would deserve consideration. Alternatively this could represent a cystic metastatic lesion. Musculoskeletal: No acute bony abnormality is noted. IMPRESSION: CT of the chest: No acute intrathoracic abnormality is noted. CT of the abdomen and pelvis: Significantly enlarged uterus with multiple uterine lesions within. These may simply represent uterine fibroids although the possibility of a more aggressive process could not be excluded given the changes in the head on recent CT. MRI would be helpful on  a nonemergent basis when the Candace Jones's condition has improved for further evaluation. Cystic structure adjacent to the rectum on the right of uncertain significance and etiology. This could also be evaluated with MRI of the pelvis. No other focal abnormality is noted. Electronically Signed   By: Inez Catalina M.D.   On: 09/29/2020 20:45   CT CEREBRAL PERFUSION W CONTRAST  Result Date: 09/29/2020 CLINICAL DATA:  Acute neuro deficit, aphasia.  Right-sided deficit EXAM: CT ANGIOGRAPHY HEAD AND NECK CT PERFUSION BRAIN TECHNIQUE: Multidetector CT imaging of the head and neck was performed using the standard protocol during bolus administration of intravenous contrast. Multiplanar CT image reconstructions and MIPs were obtained to evaluate the vascular anatomy. Carotid stenosis measurements (when applicable) are obtained utilizing NASCET criteria, using the distal internal carotid diameter as the denominator. Multiphase CT imaging of the brain was performed following IV bolus contrast injection. Subsequent parametric perfusion maps were calculated using RAPID software. CONTRAST:  131mL OMNIPAQUE IOHEXOL 350 MG/ML SOLN COMPARISON:  CT head 09/29/2020 FINDINGS: CTA NECK FINDINGS Aortic arch: Standard branching. Imaged portion shows no evidence of aneurysm or dissection.  No significant stenosis of the major arch vessel origins. Right carotid system: Normal right carotid. Negative for atherosclerotic disease. Left carotid system: Negative left carotid. Negative for atherosclerotic disease or stenosis. Vertebral arteries: Normal vertebral arteries bilaterally. Skeleton: No acute skeletal abnormality. Other neck: Negative for mass or enlarged lymph node. Upper chest: Mild ground-glass density in the right upper lobe without focal infiltrate or mass. Review of the MIP images confirms the above findings CTA HEAD FINDINGS Anterior circulation: Normal anterior circulation. Negative for stenosis or large vessel occlusion.  Posterior circulation: Normal posterior circulation. Negative for stenosis or large vessel occlusion. Venous sinuses: Normal venous enhancement. Anatomic variants: None Review of the MIP images confirms the above findings CT Brain Perfusion Findings: ASPECTS: 10 CBF (<30%) Volume: 80mL Perfusion (Tmax>6.0s) volume: 61mL Mismatch Volume: 6mL Infarction Location:Patchy decreased perfusion in the left frontal lobe and high right frontal lobe. IMPRESSION: Normal CT angiogram head neck. No significant atherosclerotic disease. No intracranial large vessel occlusion CT perfusion demonstrates small patchy areas of diminished perfusion left frontal and right frontal lobe of uncertain significance. No fixed infarct. Electronically Signed   By: Franchot Gallo M.D.   On: 09/29/2020 18:55   CT HEAD CODE STROKE WO CONTRAST  Addendum Date: 09/29/2020   ADDENDUM REPORT: 09/29/2020 18:36 ADDENDUM: These results were called by telephone at the time of interpretation on 09/29/2020 at 6:36 pm to provider [facility] , who verbally acknowledged these results. Code stroke imaging results were communicated on 09/29/2020 at 6:36 pm to provider Bhagat via text page Electronically Signed   By: Franchot Gallo M.D.   On: 09/29/2020 18:36   Result Date: 09/29/2020 CLINICAL DATA:  Code stroke. Acute neuro deficit. Right-sided deficit, aphasia EXAM: CT HEAD WITHOUT CONTRAST TECHNIQUE: Contiguous axial images were obtained from the base of the skull through the vertex without intravenous contrast. COMPARISON:  None. FINDINGS: Brain: Moderate amount of white matter edema is present in the left temporal lobe and left posterior frontal lobe with sparing of the cortex. There is mass-effect and 6 mm midline shift to the right. No edema on the right side. No hemorrhage. Ventricle size normal. Vascular: Negative for hyperdense vessel Skull: Negative Sinuses/Orbits: Mild mucosal edema paranasal sinuses. Negative orbit Other: None ASPECTS ([place]  Stroke Program Early CT Score) - Ganglionic level infarction (caudate, lentiform nuclei, internal capsule, insula, M1-M3 cortex): 7 - Supraganglionic infarction (M4-M6 cortex): 3 Total score (0-10 with 10 being normal): 10 IMPRESSION: 1. Moderate amount of white matter edema in the left frontal and temporal lobe with sparing of cortex. Pattern is most likely due to tumor. Possible metastatic disease or primary brain tumor. Recommend MRI brain without and with contrast 2. ASPECTS is 10 Electronically Signed: By: Franchot Gallo M.D. On: 09/29/2020 18:33    Procedures Procedures   Medications Ordered in ED Medications  sodium chloride flush (NS) 0.9 % injection 3 mL (has no administration in time range)  0.9 %  sodium chloride infusion (Manually program via Guardrails IV Fluids) (has no administration in time range)  dexamethasone (DECADRON) injection 6 mg (6 mg Intravenous Given 09/29/20 2213)  acetaminophen (TYLENOL) tablet 650 mg (has no administration in time range)    Or  acetaminophen (TYLENOL) suppository 650 mg (has no administration in time range)  iohexol (OMNIPAQUE) 350 MG/ML injection 100 mL (100 mLs Intravenous Contrast Given 09/29/20 1841)  LORazepam (ATIVAN) 2 MG/ML injection (2 mg  Given 09/29/20 1914)  levETIRAcetam (KEPPRA) IVPB 1500 mg/ 100 mL premix (0 mg Intravenous Stopped 09/29/20  2232)  iohexol (OMNIPAQUE) 300 MG/ML solution 70 mL (70 mLs Intravenous Contrast Given 09/29/20 2017)    ED Course  I have reviewed the triage vital signs and the nursing notes.  Pertinent labs & imaging results that were available during my care of the Candace Jones were reviewed by me and considered in my medical decision making (see chart for details).    MDM Rules/Calculators/A&P                          Candace Jones is a 40 year old female with no medical history brought in for confusion and altered mental status throughout the day today.  On arrival, Candace Jones treated as a code stroke received CT head  imaging that demonstrated intracranial masses with concern for malignancy.  Candace Jones's exam documented above.  Candace Jones's exam is changing.  After CT imaging, this provider was called to bedside due to concern for seizure.  Noted to have right upper arm shaking and right facial twitching initially that resolved and then 30 seconds later went into obvious generalized tonic-clonic seizures.  Appeared to be focal transitioning to grand mal seizure.  Entire episode lasted 3 minutes and aborted with Ativan 2 mg.  Candace Jones loaded with Keppra due to concern for intracranial mass and chest abdomen pelvis scan with contrast for malignancy evaluation was ordered as well as a Keppra load for seizure prevention.  Candace Jones's head of bed raised greater than 30 degrees and neurosurgery was emergently consulted.  They recommended initiation of steroids and their final plan pending at this time.  Additionally, Candace Jones with an anemia that appears unknown about per family history.  We will proceed with transfusion based on consent provided by Candace Jones's Candace Jones.  Candace Jones's Candace Jones is in route.  Candace Jones's adult Candace Jones deferring medical decisions to Candace Jones's Candace Jones at this time.  Candace Jones admitted ready to move to hospitalist with neurosurgery consultation. Final Clinical Impression(s) / ED Diagnoses Final diagnoses:  Intracranial mass    Rx / DC Orders ED Discharge Orders    None       Tretha Sciara, MD 09/29/20 2236    Lorelle Gibbs, DO 09/30/20 2326

## 2020-09-29 NOTE — H&P (Signed)
History and Physical    Candace Jones ZOX:[id number] DOB: [birth year] DOA: 09/29/2020  PCP: Patient, No Pcp Per (Inactive)  Patient coming from: Home.  History obtained from ER physician.  Patient's daughter.  Patient is encephalopathic and confused. Used [place] [employer].  Family and patient speaks [place].  Chief Complaint: Aphasia and right-sided weakness.  HPI: Candace Jones is a 40 y.o. female with unknown past medical history was brought to the ER after patient came back from possible center with aphasia and right facial droop and right-sided weakness.  Patient was found to be confused and was brought to the ER as a code stroke.  As per the report patient has been feeling weak for the last 2 weeks and complain of some mild headache prior.  Patient did states she has had severe menorrhagia.  ED Course: In the ER patient's weakness had improved and CT head was showing possibility of a left sided brain mass with edema.  Neurology and on-call neurosurgery were consulted.  While in the ER patient had a generalized tonic-clonic seizure which started off as a jerking movement of the right upper extremity and became generalized.  Patient was loaded with Keppra.  The whole seizure episode lasted for around 3 minutes.  At the time of my exam patient is mildly confused not able to provide proper history.  EKG shows normal sinus rhythm labs are significant for hemoglobin around 6 with microcytic hypochromic picture.  Neurosurgery requested getting CT chest abdomen pelvis to look for any primary malignant lesion.  CT abdomen was showing uterine lesion and cystic lesions in the rectum which will need further assessment with MRI.  Patient was started on Decadron.  COVID test was negative.  2 units of PRBC transfusion was ordered for the anemia.  Review of Systems: As per HPI, rest all negative.   History reviewed. No pertinent past medical history.  History reviewed. No pertinent surgical  history.   reports that she has never smoked. She has never used smokeless tobacco. She reports that she does not drink alcohol and does not use drugs.  No Known Allergies  Family History  Family history unknown: Yes    Prior to Admission medications   Medication Sig Start Date End Date Taking? Authorizing Provider  erythromycin ophthalmic ointment Place a 1/2 inch ribbon of ointment into the LEFT lower eyelid. 04/05/19   Henderly, Britni A, PA-C  methocarbamol (ROBAXIN) 500 MG tablet Take 1 tablet (500 mg total) by mouth 2 (two) times daily. 09/17/19   Alroy Bailiff, Margaux, PA-C  naproxen (NAPROSYN) 500 MG tablet Take 1 tablet (500 mg total) by mouth 2 (two) times daily. 09/17/19   Eustaquio Maize, PA-C    Physical Exam: Constitutional: Moderately built and nourished. Vitals:   09/29/20 1935 09/29/20 1945 09/29/20 1946 09/29/20 2000  BP:  134/80  (!) 139/92  Pulse:  94 100 (!) 101  Resp: (!) 28 (!) 31 (!) 31 (!) 35  Temp:      TempSrc:      SpO2:  (!) 85% 99% 98%  Weight:       Eyes: Anicteric mild pallor. ENMT: No discharge from the ears eyes nose or mouth. Neck: No mass felt.  No neck rigidity. Respiratory: No rhonchi or crepitations. Cardiovascular: S1-S2 heard. Abdomen: Soft nontender bowel sounds present. Musculoskeletal: No edema. Skin: No rash. Neurologic: Alert awake but appears confused.  Moving all extremities.  Generally weak with right facial droop. Psychiatric: Appears confused.   Labs on Admission:  I have personally reviewed following labs and imaging studies  CBC: Recent Labs  Lab 09/29/20 1814 09/29/20 1822  WBC 4.1  --   NEUTROABS 1.8  --   HGB 6.6* 8.5*  HCT 24.3* 25.0*  MCV 61.5*  --   PLT 213  --    Basic Metabolic Panel: Recent Labs  Lab 09/29/20 1814 09/29/20 1822  NA 133* 139  K 3.5 3.5  CL 105 106  CO2 19*  --   GLUCOSE 114* 111*  BUN 10 10  CREATININE 0.57 0.50  CALCIUM 8.7*  --    GFR: CrCl cannot be calculated (Unknown ideal  weight.). Liver Function Tests: Recent Labs  Lab 09/29/20 1814  AST 41  ALT 27  ALKPHOS 66  BILITOT 0.6  PROT 9.7*  ALBUMIN 3.6   No results for input(s): LIPASE, AMYLASE in the last 168 hours. No results for input(s): AMMONIA in the last 168 hours. Coagulation Profile: Recent Labs  Lab 09/29/20 1814  INR 1.1   Cardiac Enzymes: No results for input(s): CKTOTAL, CKMB, CKMBINDEX, TROPONINI in the last 168 hours. BNP (last 3 results) No results for input(s): PROBNP in the last 8760 hours. HbA1C: No results for input(s): HGBA1C in the last 72 hours. CBG: Recent Labs  Lab 09/29/20 1814  GLUCAP 107*   Lipid Profile: No results for input(s): CHOL, HDL, LDLCALC, TRIG, CHOLHDL, LDLDIRECT in the last 72 hours. Thyroid Function Tests: No results for input(s): TSH, T4TOTAL, FREET4, T3FREE, THYROIDAB in the last 72 hours. Anemia Panel: No results for input(s): VITAMINB12, FOLATE, FERRITIN, TIBC, IRON, RETICCTPCT in the last 72 hours. Urine analysis: No results found for: COLORURINE, APPEARANCEUR, LABSPEC, PHURINE, GLUCOSEU, HGBUR, BILIRUBINUR, KETONESUR, PROTEINUR, UROBILINOGEN, NITRITE, LEUKOCYTESUR Sepsis Labs: @LABRCNTIP (procalcitonin:4,lacticidven:4) )No results found for this or any previous visit (from the past 240 hour(s)).   Radiological Exams on Admission: CT ANGIO HEAD W OR WO CONTRAST  Result Date: 09/29/2020 CLINICAL DATA:  Acute neuro deficit, aphasia.  Right-sided deficit EXAM: CT ANGIOGRAPHY HEAD AND NECK CT PERFUSION BRAIN TECHNIQUE: Multidetector CT imaging of the head and neck was performed using the standard protocol during bolus administration of intravenous contrast. Multiplanar CT image reconstructions and MIPs were obtained to evaluate the vascular anatomy. Carotid stenosis measurements (when applicable) are obtained utilizing NASCET criteria, using the distal internal carotid diameter as the denominator. Multiphase CT imaging of the brain was performed  following IV bolus contrast injection. Subsequent parametric perfusion maps were calculated using RAPID software. CONTRAST:  174mL OMNIPAQUE IOHEXOL 350 MG/ML SOLN COMPARISON:  CT head 09/29/2020 FINDINGS: CTA NECK FINDINGS Aortic arch: Standard branching. Imaged portion shows no evidence of aneurysm or dissection. No significant stenosis of the major arch vessel origins. Right carotid system: Normal right carotid. Negative for atherosclerotic disease. Left carotid system: Negative left carotid. Negative for atherosclerotic disease or stenosis. Vertebral arteries: Normal vertebral arteries bilaterally. Skeleton: No acute skeletal abnormality. Other neck: Negative for mass or enlarged lymph node. Upper chest: Mild ground-glass density in the right upper lobe without focal infiltrate or mass. Review of the MIP images confirms the above findings CTA HEAD FINDINGS Anterior circulation: Normal anterior circulation. Negative for stenosis or large vessel occlusion. Posterior circulation: Normal posterior circulation. Negative for stenosis or large vessel occlusion. Venous sinuses: Normal venous enhancement. Anatomic variants: None Review of the MIP images confirms the above findings CT Brain Perfusion Findings: ASPECTS: 10 CBF (<30%) Volume: 63mL Perfusion (Tmax>6.0s) volume: 55mL Mismatch Volume: 63mL Infarction Location:Patchy decreased perfusion in the left frontal lobe and  high right frontal lobe. IMPRESSION: Normal CT angiogram head neck. No significant atherosclerotic disease. No intracranial large vessel occlusion CT perfusion demonstrates small patchy areas of diminished perfusion left frontal and right frontal lobe of uncertain significance. No fixed infarct. Electronically Signed   By: Franchot Gallo M.D.   On: 09/29/2020 18:55   CT ANGIO NECK W OR WO CONTRAST  Result Date: 09/29/2020 CLINICAL DATA:  Acute neuro deficit, aphasia.  Right-sided deficit EXAM: CT ANGIOGRAPHY HEAD AND NECK CT PERFUSION BRAIN  TECHNIQUE: Multidetector CT imaging of the head and neck was performed using the standard protocol during bolus administration of intravenous contrast. Multiplanar CT image reconstructions and MIPs were obtained to evaluate the vascular anatomy. Carotid stenosis measurements (when applicable) are obtained utilizing NASCET criteria, using the distal internal carotid diameter as the denominator. Multiphase CT imaging of the brain was performed following IV bolus contrast injection. Subsequent parametric perfusion maps were calculated using RAPID software. CONTRAST:  162mL OMNIPAQUE IOHEXOL 350 MG/ML SOLN COMPARISON:  CT head 09/29/2020 FINDINGS: CTA NECK FINDINGS Aortic arch: Standard branching. Imaged portion shows no evidence of aneurysm or dissection. No significant stenosis of the major arch vessel origins. Right carotid system: Normal right carotid. Negative for atherosclerotic disease. Left carotid system: Negative left carotid. Negative for atherosclerotic disease or stenosis. Vertebral arteries: Normal vertebral arteries bilaterally. Skeleton: No acute skeletal abnormality. Other neck: Negative for mass or enlarged lymph node. Upper chest: Mild ground-glass density in the right upper lobe without focal infiltrate or mass. Review of the MIP images confirms the above findings CTA HEAD FINDINGS Anterior circulation: Normal anterior circulation. Negative for stenosis or large vessel occlusion. Posterior circulation: Normal posterior circulation. Negative for stenosis or large vessel occlusion. Venous sinuses: Normal venous enhancement. Anatomic variants: None Review of the MIP images confirms the above findings CT Brain Perfusion Findings: ASPECTS: 10 CBF (<30%) Volume: 43mL Perfusion (Tmax>6.0s) volume: 1mL Mismatch Volume: 68mL Infarction Location:Patchy decreased perfusion in the left frontal lobe and high right frontal lobe. IMPRESSION: Normal CT angiogram head neck. No significant atherosclerotic disease. No  intracranial large vessel occlusion CT perfusion demonstrates small patchy areas of diminished perfusion left frontal and right frontal lobe of uncertain significance. No fixed infarct. Electronically Signed   By: Franchot Gallo M.D.   On: 09/29/2020 18:55   CT CHEST ABDOMEN PELVIS W CONTRAST  Result Date: 09/29/2020 CLINICAL DATA:  Intracranial mass, rule out primary lesion EXAM: CT CHEST, ABDOMEN, AND PELVIS WITH CONTRAST TECHNIQUE: Multidetector CT imaging of the chest, abdomen and pelvis was performed following the standard protocol during bolus administration of intravenous contrast. CONTRAST:  76mL OMNIPAQUE IOHEXOL 300 MG/ML  SOLN COMPARISON:  None. FINDINGS: CT CHEST FINDINGS Cardiovascular: Thoracic aorta and its branches are within normal limits. No aneurysmal dilatation is seen. The pulmonary artery as visualized is within normal limits. Heart is not significantly enlarged. Mediastinum/Nodes: Thoracic inlet is within normal limits. No hilar or mediastinal adenopathy is noted. The esophagus as visualized is within normal limits. Lungs/Pleura: The lungs are well aerated bilaterally. Mild dependent atelectatic changes are seen. No focal parenchymal nodule is noted. No sizable effusion or pneumothorax is noted. Musculoskeletal: Degenerative changes of the thoracic spine are noted. No rib abnormality is seen. No specific breast mass is noted. CT ABDOMEN PELVIS FINDINGS Hepatobiliary: No focal liver abnormality is seen. No gallstones, gallbladder wall thickening, or biliary dilatation. Pancreas: Unremarkable. No pancreatic ductal dilatation or surrounding inflammatory changes. Spleen: Normal in size without focal abnormality. Adrenals/Urinary Tract: Adrenal glands are within normal  limits. Kidneys demonstrate a normal enhancement pattern with normal excretion bilaterally. No obstructive changes are seen. Bladder is well distended with opacified urine consistent with prior contrast administration.  Stomach/Bowel: No obstructive or inflammatory changes of the colon are noted. The appendix is well visualized and within normal limits. Small bowel and stomach appear within normal limits. Vascular/Lymphatic: No significant vascular findings are present. No enlarged abdominal or pelvic lymph nodes. Reproductive: Uterus is well visualized and demonstrates bulky enlargement with multifocal lesions. The largest of these measures at least 10 cm in dimension. These likely represent uterine fibroids although the possibility of a more aggressive lesion could not be totally excluded. Other: Adjacent to the rectum on the right there is a somewhat multiloculated cystic structure which measures approximately 4.6 by 3.0 cm. This is of uncertain etiology. Possibility of small perirectal abscess would deserve consideration. Alternatively this could represent a cystic metastatic lesion. Musculoskeletal: No acute bony abnormality is noted. IMPRESSION: CT of the chest: No acute intrathoracic abnormality is noted. CT of the abdomen and pelvis: Significantly enlarged uterus with multiple uterine lesions within. These may simply represent uterine fibroids although the possibility of a more aggressive process could not be excluded given the changes in the head on recent CT. MRI would be helpful on a nonemergent basis when the patient's condition has improved for further evaluation. Cystic structure adjacent to the rectum on the right of uncertain significance and etiology. This could also be evaluated with MRI of the pelvis. No other focal abnormality is noted. Electronically Signed   By: Inez Catalina M.D.   On: 09/29/2020 20:45   CT CEREBRAL PERFUSION W CONTRAST  Result Date: 09/29/2020 CLINICAL DATA:  Acute neuro deficit, aphasia.  Right-sided deficit EXAM: CT ANGIOGRAPHY HEAD AND NECK CT PERFUSION BRAIN TECHNIQUE: Multidetector CT imaging of the head and neck was performed using the standard protocol during bolus administration  of intravenous contrast. Multiplanar CT image reconstructions and MIPs were obtained to evaluate the vascular anatomy. Carotid stenosis measurements (when applicable) are obtained utilizing NASCET criteria, using the distal internal carotid diameter as the denominator. Multiphase CT imaging of the brain was performed following IV bolus contrast injection. Subsequent parametric perfusion maps were calculated using RAPID software. CONTRAST:  163mL OMNIPAQUE IOHEXOL 350 MG/ML SOLN COMPARISON:  CT head 09/29/2020 FINDINGS: CTA NECK FINDINGS Aortic arch: Standard branching. Imaged portion shows no evidence of aneurysm or dissection. No significant stenosis of the major arch vessel origins. Right carotid system: Normal right carotid. Negative for atherosclerotic disease. Left carotid system: Negative left carotid. Negative for atherosclerotic disease or stenosis. Vertebral arteries: Normal vertebral arteries bilaterally. Skeleton: No acute skeletal abnormality. Other neck: Negative for mass or enlarged lymph node. Upper chest: Mild ground-glass density in the right upper lobe without focal infiltrate or mass. Review of the MIP images confirms the above findings CTA HEAD FINDINGS Anterior circulation: Normal anterior circulation. Negative for stenosis or large vessel occlusion. Posterior circulation: Normal posterior circulation. Negative for stenosis or large vessel occlusion. Venous sinuses: Normal venous enhancement. Anatomic variants: None Review of the MIP images confirms the above findings CT Brain Perfusion Findings: ASPECTS: 10 CBF (<30%) Volume: 49mL Perfusion (Tmax>6.0s) volume: 66mL Mismatch Volume: 55mL Infarction Location:Patchy decreased perfusion in the left frontal lobe and high right frontal lobe. IMPRESSION: Normal CT angiogram head neck. No significant atherosclerotic disease. No intracranial large vessel occlusion CT perfusion demonstrates small patchy areas of diminished perfusion left frontal and right  frontal lobe of uncertain significance. No fixed infarct. Electronically Signed  By: Franchot Gallo M.D.   On: 09/29/2020 18:55   CT HEAD CODE STROKE WO CONTRAST  Addendum Date: 09/29/2020   ADDENDUM REPORT: 09/29/2020 18:36 ADDENDUM: These results were called by telephone at the time of interpretation on 09/29/2020 at 6:36 pm to provider [facility] , who verbally acknowledged these results. Code stroke imaging results were communicated on 09/29/2020 at 6:36 pm to provider Bhagat via text page Electronically Signed   By: Franchot Gallo M.D.   On: 09/29/2020 18:36   Result Date: 09/29/2020 CLINICAL DATA:  Code stroke. Acute neuro deficit. Right-sided deficit, aphasia EXAM: CT HEAD WITHOUT CONTRAST TECHNIQUE: Contiguous axial images were obtained from the base of the skull through the vertex without intravenous contrast. COMPARISON:  None. FINDINGS: Brain: Moderate amount of white matter edema is present in the left temporal lobe and left posterior frontal lobe with sparing of the cortex. There is mass-effect and 6 mm midline shift to the right. No edema on the right side. No hemorrhage. Ventricle size normal. Vascular: Negative for hyperdense vessel Skull: Negative Sinuses/Orbits: Mild mucosal edema paranasal sinuses. Negative orbit Other: None ASPECTS ([place] Stroke Program Early CT Score) - Ganglionic level infarction (caudate, lentiform nuclei, internal capsule, insula, M1-M3 cortex): 7 - Supraganglionic infarction (M4-M6 cortex): 3 Total score (0-10 with 10 being normal): 10 IMPRESSION: 1. Moderate amount of white matter edema in the left frontal and temporal lobe with sparing of cortex. Pattern is most likely due to tumor. Possible metastatic disease or primary brain tumor. Recommend MRI brain without and with contrast 2. ASPECTS is 10 Electronically Signed: By: Franchot Gallo M.D. On: 09/29/2020 18:33    EKG: Independently reviewed.  Normal sinus rhythm.  Assessment/Plan Principal Problem:    Intracranial mass Active Problems:   Seizure (HCC)   Microcytic hypochromic anemia    1. Intracranial mass -appreciate neurology and neurosurgery input.  Patient is on Decadron and Keppra.  MRI brain with and without contrast has been ordered.  On neurochecks. 2. Seizures likely precipitated by intracranial mass.  On Keppra presently.  EEG has been ordered.  Appears postictal at this time. 3. Acute encephalopathy likely postictal secondary to seizures.  Closely monitor. 4. Microcytic hypochromic anemia no old labs to compare.  Likely from menorrhagia as patient states she has had heavy menstrual cycles.  2 units of PRBC transfusion has been ordered.  Check iron and ferritin levels.  Follow CBC. 5. Abnormal uterine lesions and cystic lesions in the rectum as seen in the CAT scan will need MRI to further assess.  Given that patient is still encephalopathic and has a brain mass with edema will need further work-up and close monitoring inpatient status.   DVT prophylaxis: SCDs for now in anticipation of procedure will avoid anticoagulation. Code Status: Full code. Family Communication: Family at the bedside. Disposition Plan: To be determined. Consults called: Neurology and neurosurgery. Admission status: Inpatient.   Rise Patience MD [facility] Pager [phone].  If 7PM-7AM, please contact night-coverage [email or web address] Password [facility]  09/29/2020, 10:27 PM

## 2020-09-29 NOTE — ED Notes (Signed)
Pt to CT

## 2020-09-29 NOTE — ED Notes (Addendum)
This RN talked to Dr. Oswald Hillock about pt neuro status and unable to obtain consent Per Dr. Oswald Hillock - Was able to obtain verbal consent for blood transfusion from POA - who is pt sister - via telephone with [place] interpreter.

## 2020-09-30 ENCOUNTER — Observation Stay (HOSPITAL_COMMUNITY): Payer: Medicaid Other

## 2020-09-30 ENCOUNTER — Inpatient Hospital Stay (HOSPITAL_COMMUNITY): Payer: Medicaid Other

## 2020-09-30 ENCOUNTER — Other Ambulatory Visit: Payer: Self-pay

## 2020-09-30 ENCOUNTER — Encounter (HOSPITAL_COMMUNITY): Payer: Self-pay | Admitting: Internal Medicine

## 2020-09-30 DIAGNOSIS — G9389 Other specified disorders of brain: Secondary | ICD-10-CM

## 2020-09-30 DIAGNOSIS — D259 Leiomyoma of uterus, unspecified: Secondary | ICD-10-CM | POA: Diagnosis present

## 2020-09-30 DIAGNOSIS — R4701 Aphasia: Secondary | ICD-10-CM | POA: Diagnosis present

## 2020-09-30 DIAGNOSIS — G8191 Hemiplegia, unspecified affecting right dominant side: Secondary | ICD-10-CM | POA: Diagnosis present

## 2020-09-30 DIAGNOSIS — R634 Abnormal weight loss: Secondary | ICD-10-CM

## 2020-09-30 DIAGNOSIS — W19XXXA Unspecified fall, initial encounter: Secondary | ICD-10-CM | POA: Diagnosis present

## 2020-09-30 DIAGNOSIS — D509 Iron deficiency anemia, unspecified: Secondary | ICD-10-CM

## 2020-09-30 DIAGNOSIS — B370 Candidal stomatitis: Secondary | ICD-10-CM | POA: Diagnosis present

## 2020-09-30 DIAGNOSIS — R569 Unspecified convulsions: Secondary | ICD-10-CM | POA: Diagnosis not present

## 2020-09-30 DIAGNOSIS — Z6830 Body mass index (BMI) 30.0-30.9, adult: Secondary | ICD-10-CM | POA: Diagnosis not present

## 2020-09-30 DIAGNOSIS — E876 Hypokalemia: Secondary | ICD-10-CM | POA: Diagnosis present

## 2020-09-30 DIAGNOSIS — R900 Intracranial space-occupying lesion found on diagnostic imaging of central nervous system: Secondary | ICD-10-CM

## 2020-09-30 DIAGNOSIS — R471 Dysarthria and anarthria: Secondary | ICD-10-CM | POA: Diagnosis present

## 2020-09-30 DIAGNOSIS — K611 Rectal abscess: Secondary | ICD-10-CM | POA: Diagnosis present

## 2020-09-30 DIAGNOSIS — L299 Pruritus, unspecified: Secondary | ICD-10-CM | POA: Diagnosis present

## 2020-09-30 DIAGNOSIS — R29810 Facial weakness: Secondary | ICD-10-CM | POA: Diagnosis present

## 2020-09-30 DIAGNOSIS — B589 Toxoplasmosis, unspecified: Secondary | ICD-10-CM | POA: Diagnosis present

## 2020-09-30 DIAGNOSIS — G9341 Metabolic encephalopathy: Secondary | ICD-10-CM | POA: Diagnosis present

## 2020-09-30 DIAGNOSIS — B20 Human immunodeficiency virus [HIV] disease: Secondary | ICD-10-CM | POA: Diagnosis present

## 2020-09-30 DIAGNOSIS — H538 Other visual disturbances: Secondary | ICD-10-CM

## 2020-09-30 DIAGNOSIS — N858 Other specified noninflammatory disorders of uterus: Secondary | ICD-10-CM

## 2020-09-30 DIAGNOSIS — R21 Rash and other nonspecific skin eruption: Secondary | ICD-10-CM | POA: Diagnosis present

## 2020-09-30 DIAGNOSIS — N920 Excessive and frequent menstruation with regular cycle: Secondary | ICD-10-CM | POA: Diagnosis present

## 2020-09-30 DIAGNOSIS — R001 Bradycardia, unspecified: Secondary | ICD-10-CM | POA: Diagnosis present

## 2020-09-30 DIAGNOSIS — K6289 Other specified diseases of anus and rectum: Secondary | ICD-10-CM

## 2020-09-30 DIAGNOSIS — Z20822 Contact with and (suspected) exposure to covid-19: Secondary | ICD-10-CM | POA: Diagnosis present

## 2020-09-30 DIAGNOSIS — M25571 Pain in right ankle and joints of right foot: Secondary | ICD-10-CM | POA: Diagnosis present

## 2020-09-30 DIAGNOSIS — E669 Obesity, unspecified: Secondary | ICD-10-CM | POA: Diagnosis present

## 2020-09-30 DIAGNOSIS — Z791 Long term (current) use of non-steroidal anti-inflammatories (NSAID): Secondary | ICD-10-CM | POA: Diagnosis not present

## 2020-09-30 DIAGNOSIS — D496 Neoplasm of unspecified behavior of brain: Secondary | ICD-10-CM | POA: Diagnosis not present

## 2020-09-30 DIAGNOSIS — K644 Residual hemorrhoidal skin tags: Secondary | ICD-10-CM | POA: Diagnosis present

## 2020-09-30 DIAGNOSIS — G936 Cerebral edema: Secondary | ICD-10-CM | POA: Diagnosis present

## 2020-09-30 LAB — COMPREHENSIVE METABOLIC PANEL
ALT: 24 U/L (ref 0–44)
AST: 33 U/L (ref 15–41)
Albumin: 3.2 g/dL — ABNORMAL LOW (ref 3.5–5.0)
Alkaline Phosphatase: 60 U/L (ref 38–126)
Anion gap: 7 (ref 5–15)
BUN: 6 mg/dL (ref 6–20)
CO2: 20 mmol/L — ABNORMAL LOW (ref 22–32)
Calcium: 8.5 mg/dL — ABNORMAL LOW (ref 8.9–10.3)
Chloride: 109 mmol/L (ref 98–111)
Creatinine, Ser: 0.48 mg/dL (ref 0.44–1.00)
GFR, Estimated: >60 mL/min (ref >60)
Glucose, Bld: 104 mg/dL — ABNORMAL HIGH (ref 70–99)
Potassium: 3.8 mmol/L (ref 3.5–5.1)
Sodium: 136 mmol/L (ref 135–145)
Total Bilirubin: 0.7 mg/dL (ref 0.3–1.2)
Total Protein: 9 g/dL — ABNORMAL HIGH (ref 6.5–8.1)

## 2020-09-30 LAB — CBC WITH DIFFERENTIAL/PLATELET
Abs Immature Granulocytes: 0.01 10*3/uL (ref 0.00–0.07)
Basophils Absolute: 0 10*3/uL (ref 0.0–0.1)
Basophils Relative: 0 %
Eosinophils Absolute: 0 10*3/uL (ref 0.0–0.5)
Eosinophils Relative: 0 %
HCT: 25.5 % — ABNORMAL LOW (ref 36.0–46.0)
Hemoglobin: 7.1 g/dL — ABNORMAL LOW (ref 12.0–15.0)
Immature Granulocytes: 0 %
Lymphocytes Relative: 28 %
Lymphs Abs: 0.9 10*3/uL (ref 0.7–4.0)
MCH: 17.6 pg — ABNORMAL LOW (ref 26.0–34.0)
MCHC: 27.8 g/dL — ABNORMAL LOW (ref 30.0–36.0)
MCV: 63.1 fL — ABNORMAL LOW (ref 80.0–100.0)
Monocytes Absolute: 0.1 10*3/uL (ref 0.1–1.0)
Monocytes Relative: 4 %
Neutro Abs: 2 10*3/uL (ref 1.7–7.7)
Neutrophils Relative %: 68 %
Platelets: 183 10*3/uL (ref 150–400)
RBC: 4.04 MIL/uL (ref 3.87–5.11)
RDW: 25.5 % — ABNORMAL HIGH (ref 11.5–15.5)
WBC: 3.1 10*3/uL — ABNORMAL LOW (ref 4.0–10.5)
nRBC: 0 % (ref 0.0–0.2)

## 2020-09-30 LAB — CRYPTOCOCCAL ANTIGEN: Crypto Ag: NEGATIVE

## 2020-09-30 LAB — MRSA PCR SCREENING: MRSA by PCR: NEGATIVE

## 2020-09-30 LAB — GLUCOSE, CAPILLARY
Glucose-Capillary: 110 mg/dL — ABNORMAL HIGH (ref 70–99)
Glucose-Capillary: 104 mg/dL — ABNORMAL HIGH (ref 70–99)
Glucose-Capillary: 121 mg/dL — ABNORMAL HIGH (ref 70–99)

## 2020-09-30 LAB — HIV ANTIBODY (ROUTINE TESTING W REFLEX): HIV Screen 4th Generation wRfx: REACTIVE — AB

## 2020-09-30 LAB — IRON AND TIBC
Iron: 18 ug/dL — ABNORMAL LOW (ref 28–170)
Saturation Ratios: 4 % — ABNORMAL LOW (ref 10.4–31.8)
TIBC: 498 ug/dL — ABNORMAL HIGH (ref 250–450)
UIBC: 480 ug/dL

## 2020-09-30 LAB — FERRITIN: Ferritin: 6 ng/mL — ABNORMAL LOW (ref 11–307)

## 2020-09-30 LAB — RESP PANEL BY RT-PCR (FLU A&B, COVID) ARPGX2
Influenza A by PCR: NEGATIVE
Influenza B by PCR: NEGATIVE
SARS Coronavirus 2 by RT PCR: NEGATIVE

## 2020-09-30 MED ORDER — LORAZEPAM 2 MG/ML IJ SOLN
0.5000 mg | Freq: Once | INTRAMUSCULAR | Status: AC | PRN
  Administered 2020-09-30: 0.5 mg via INTRAVENOUS
  Filled 2020-09-30: qty 1

## 2020-09-30 MED ORDER — NYSTATIN 100000 UNIT/ML MT SUSP
5.0000 mL | Freq: Four times a day (QID) | OROMUCOSAL | Status: DC
  Administered 2020-09-30 – 2020-10-10 (×30): 500000 [IU] via ORAL
  Filled 2020-09-30 (×33): qty 5

## 2020-09-30 MED ORDER — LORAZEPAM 2 MG/ML IJ SOLN: 2.0000 mg | INTRAMUSCULAR | Status: DC | PRN

## 2020-09-30 MED ORDER — GADOBUTROL 1 MMOL/ML IV SOLN
9.0000 mL | Freq: Once | INTRAVENOUS | Status: AC | PRN
  Administered 2020-09-30: 9 mL via INTRAVENOUS

## 2020-09-30 MED ORDER — FERROUS SULFATE 325 (65 FE) MG PO TABS
325.0000 mg | ORAL_TABLET | Freq: Every day | ORAL | Status: DC
  Administered 2020-09-30 – 2020-10-10 (×9): 325 mg via ORAL
  Filled 2020-09-30 (×10): qty 1

## 2020-09-30 MED ORDER — ENSURE ENLIVE PO LIQD
237.0000 mL | Freq: Two times a day (BID) | ORAL | Status: DC
  Administered 2020-09-30 – 2020-10-06 (×5): 237 mL via ORAL
  Filled 2020-09-30: qty 237

## 2020-09-30 MED ORDER — SODIUM CHLORIDE 0.9 % IV SOLN
510.0000 mg | Freq: Once | INTRAVENOUS | Status: AC
  Administered 2020-09-30: 510 mg via INTRAVENOUS
  Filled 2020-09-30: qty 17

## 2020-09-30 NOTE — Progress Notes (Signed)
LTM maint complete - Cz.

## 2020-09-30 NOTE — Progress Notes (Signed)
Initial Nutrition Assessment  DOCUMENTATION CODES:   Obesity unspecified  INTERVENTION:  Provide Ensure Enlive po BID, each supplement provides 350 kcal and 20 grams of protein.  Encourage adequate PO intake.   NUTRITION DIAGNOSIS:   Increased nutrient needs related to acute illness as evidenced by estimated needs.  GOAL:   Patient will meet greater than or equal to 90% of their needs  MONITOR:   PO intake,Supplement acceptance,Skin,Weight trends,Labs,I & O's,Diet advancement  REASON FOR ASSESSMENT:   Malnutrition Screening Tool    ASSESSMENT:   40 year old female from [place] with no significant past medical history presents with complaints of aphasia, right facial droop, right-sided weakness. CT head showed possible left-sided brain mass with edema. 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 Neurosurgery suspects this lesions might be from toxoplasmosis if HIV is positive.  Pt asleep during time of visit and did not awaken to RD assessment. Unable to obtain pt nutrition history at this time. Pt previously NPO for MRI of abdomen/pelvis. Diet has just been advanced to a dysphagia 3 diet with thin liquids. RD to order nutritional supplements to aid in caloric and protein needs.   Unable to complete Nutrition-Focused physical exam at this time. Labs and medications reviewed. HIV test revealed reactive results.   Diet Order:   Diet Order            Diet NPO time specified  Diet effective midnight           DIET DYS 3 Room service appropriate? Yes with Assist; Fluid consistency: Thin  Diet effective now                 EDUCATION NEEDS:   Not appropriate for education at this time  Skin:  Skin Assessment: Reviewed RN Assessment  Last BM:  Unknown  Height:   Ht Readings from Last 1 Encounters:  04/04/19 5' 8.11" (1.73 m)    Weight:   Wt Readings from Last 1  Encounters:  09/29/20 89.8 kg    BMI:  Body mass index is 30 kg/m.  Estimated Nutritional Needs:   Kcal:  2000-2200  Protein:  100-115 grams  Fluid:  >/= 2 L/day  Corrin Parker, MS, RD, LDN RD pager number/after hours weekend pager number on Amion.

## 2020-09-30 NOTE — Plan of Care (Signed)
  Problem: Education: Goal: Knowledge of General Education information will improve Description: Including pain rating scale, medication(s)/side effects and non-pharmacologic comfort measures Outcome: Progressing   Problem: Health Behavior/Discharge Planning: Goal: Ability to manage health-related needs will improve Outcome: Not Progressing   

## 2020-09-30 NOTE — Progress Notes (Signed)
Neurology Progress Note Subjective: Witnessed seizure last night in ER s/p Ativan and Keppra load Patient remains aphasic. Per family at bedside patient sounds dysarthric and aphasic this morning She endorses blurry vision as well  Patient endorses to family a possible fall and seizure last week with hospital visit for headache evaluation but without further information on chart review. Patient is a poor historian and unable to provide further details.   Exam: Vitals:   09/30/20 0241 09/30/20 0500  BP: 134/78 119/82  Pulse: 70 65  Resp: 20 20  Temp: 97.9 F (36.6 C)   SpO2: 100% 100%   Gen: In bed, in no acute distress Resp: non-labored breathing, no respiratory distress Abd: soft, non-tender, non-distended  Neuro: Mental Status: Patient is awake, alert, and oriented to self only with family interpreting at bedside. Patient does not cooperate with video or phone interpreter. She is unable to state the year but repeats "October" when asked the year. She is unable to communicate further orientation questions. Per family, patient is aphasic; replacing words within sentences and struggles with word finding and she sounds dysarthric. She is unable to name objects or repeat phrases. She is unable to provide a clear or coherent history of present illness. She does endorse falling at home with right-sided facial drooping after a fall and a possible seizure but is unable to elaborate on timeframe or further detail.  No neglect is noted on examination. CN: Pupils are 7 mm, equal, round, briskly reactive to light, visual fields are full, EOMI, patient will fixate and track examiner around room, eyelids elevate fully and symmetrically, face with right mouth drooping, sensation to light touch intact and symmetric on the face, hearing is intact to voice, symmetric palate rise, hypophonic on examination, shoulder shrug symmetric, tongue protrudes rightward without fasciculations.  Motor: Moves bilateral  upper extremities spontaneously and with antigravity movement without pronator drift. Left lower extremity with spontaneous and antigravity movement 5/5 strength. Right lower extremity with pain limited assessment (ankle brace on right ankle due to fall at home approximately one week ago). RLE 4/5 strength with some antigravity movement but patient attempts to lift the leg with her RUE on command to prevent vertical drift.  Sensory: Sensation is intact and symmetric to light touch in bilateral upper and lower extremities.  Coordination: FTN intact bilaterally.   DTRs: 2+ and symmetric biceps, 3+ symmetric patellae Plantars: Left: up going Right: up going Gait: deferred  Pertinent Labs: CBC    Component Value Date/Time   WBC 4.1 09/29/2020 1814   RBC 3.95 09/29/2020 1814   HGB 8.5 (L) 09/29/2020 1822   HCT 25.0 (L) 09/29/2020 1822   PLT 213 09/29/2020 1814   MCV 61.5 (L) 09/29/2020 1814   MCH 16.7 (L) 09/29/2020 1814   MCHC 27.2 (L) 09/29/2020 1814   RDW 23.7 (H) 09/29/2020 1814   LYMPHSABS 1.6 09/29/2020 1814   MONOABS 0.4 09/29/2020 1814   EOSABS 0.3 09/29/2020 1814   BASOSABS 0.0 09/29/2020 1814   CMP     Component Value Date/Time   NA 136 09/30/2020 0555   K 3.8 09/30/2020 0555   CL 109 09/30/2020 0555   CO2 20 (L) 09/30/2020 0555   GLUCOSE 104 (H) 09/30/2020 0555   BUN 6 09/30/2020 0555   CREATININE 0.48 09/30/2020 0555   CALCIUM 8.5 (L) 09/30/2020 0555   PROT 9.0 (H) 09/30/2020 0555   ALBUMIN 3.2 (L) 09/30/2020 0555   AST 33 09/30/2020 0555   ALT 24 09/30/2020 0555  ALKPHOS 60 09/30/2020 0555   BILITOT 0.7 09/30/2020 0555   GFRNONAA >60 09/30/2020 0555   Imaging I have reviewed the images obtained: CT-scan of the brain: 1. Moderate amount of white matter edema in the left frontal and temporal lobe with sparing of cortex. Pattern is most likely due to tumor. Possible metastatic disease or primary brain tumor. 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  Assessment: 40 year old female who presents via EMS for acute onset of confusion, right facial droop, and aphasia. Initial imaging concerning for mass / tumor with edema in the left frontal and temporal lobes.  - Examination reveals persistent aphasia and right-sided mouth droop.  - CT with concern for metastatic or primary brain tumor. 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  - Patient with witnessed focal seizure and post-ictal state in the ED on arrival s/p Ativan and Keppra load. Plan to continue maintenance Keppra and initiate EEG monitoring for further evaluation and treatment of seizure activity.   Recommendations: - LTM EEG ordered for persistent alteration from baseline mental status - Continue Keppra 750 mg BID - Frequent neuro checks - Inpatient seizure precautions - PRN 2mg  Ativan for seizure lasting > 5 minutes, notify neurology if PRN Ativan given - Tumor management per neurosurgery team  - Neurology will continue to follow  Patient seen independently by NP and discussed plan of care with attending provider. Attending provider in agreement.  Anibal Henderson, AGACNP-BC [facility] [phone]

## 2020-09-30 NOTE — Progress Notes (Signed)
vLTM EEG started. Notified Neuro

## 2020-09-30 NOTE — Consult Note (Signed)
Reason for Consult: Brain lesions Referring Physician: Dr. Rondel Oh Candace Jones is an 40 y.o. female.  HPI: The patient is a 40 year old black female immigrant from [place] who speaks [place] who by report began having a headache and generalized weakness about 2 weeks ago.  She presented to the ER yesterday right hemiparetic and aphasic.  She was worked up with a head CT which demonstrated left brain swelling and then a brain MRI which demonstrated 2 brain lesions.  She was admitted by the hospitalist for further work-up.  A neurosurgical consultation was requested.  By report she had a seizure.  Presently the patient is somnolent but easily arousable.  Her sister is at the bedside who translates for [place].  She admits to a mild headache.  He cannot provide me much more details.  There is no known history of HIV.  She denies risk factors.  History reviewed. No pertinent past medical history.  History reviewed. No pertinent surgical history.  Family History  Family history unknown: Yes    Social History:  reports that she has never smoked. She has never used smokeless tobacco. She reports that she does not drink alcohol and does not use drugs.  Allergies: No Known Allergies  Medications:  I have reviewed the patient's current medications. Prior to Admission:  Medications Prior to Admission  Medication Sig Dispense Refill Last Dose  . erythromycin ophthalmic ointment Place a 1/2 inch ribbon of ointment into the LEFT lower eyelid. 1 g 0   . methocarbamol (ROBAXIN) 500 MG tablet Take 1 tablet (500 mg total) by mouth 2 (two) times daily. 20 tablet 0   . naproxen (NAPROSYN) 500 MG tablet Take 1 tablet (500 mg total) by mouth 2 (two) times daily. 30 tablet 0    Scheduled: . dexamethasone (DECADRON) injection  6 mg Intravenous Q6H  . ferrous sulfate  325 mg Oral Q breakfast  . sodium chloride flush  3 mL Intravenous Once   Continuous: . levETIRAcetam     FWY:OVZCHYIFOYDXA **OR**  acetaminophen Anti-infectives (From admission, onward)   None       Results for orders placed or performed during the hospital encounter of 09/29/20 (from the past 48 hour(s))  Protime-INR     Status: None   Collection Time: 09/29/20  6:14 PM  Result Value Ref Range   Prothrombin Time 14.3 11.4 - 15.2 seconds   INR 1.1 0.8 - 1.2    Comment: (NOTE) INR goal varies based on device and disease states. Performed at [facility], [place] [address] [place], [place] [address]   APTT     Status: Abnormal   Collection Time: 09/29/20  6:14 PM  Result Value Ref Range   aPTT 23 (L) 24 - 36 seconds    Comment: Performed at [facility], [place] [address], [place], [place] [address]  CBC     Status: Abnormal   Collection Time: 09/29/20  6:14 PM  Result Value Ref Range   WBC 4.1 4.0 - 10.5 K/uL   RBC 3.95 3.87 - 5.11 MIL/uL   Hemoglobin 6.6 (LL) 12.0 - 15.0 g/dL    Comment: REPEATED TO VERIFY Reticulocyte Hemoglobin testing may be clinically indicated, consider ordering this additional test [id number] THIS CRITICAL RESULT HAS VERIFIED AND BEEN CALLED TO Z.CAGLE,RN BY BONNIE DAVIS ON 04 18 2022 AT 1851, AND HAS BEEN READ BACK.     HCT 24.3 (L) 36.0 - 46.0 %   MCV 61.5 (L) 80.0 -  100.0 fL   MCH 16.7 (L) 26.0 - 34.0 pg   MCHC 27.2 (L) 30.0 - 36.0 g/dL   RDW 23.7 (H) 11.5 - 15.5 %   Platelets 213 150 - 400 K/uL   nRBC 0.0 0.0 - 0.2 %    Comment: Performed at [facility] [address] [place], [place] [address]  Differential     Status: None   Collection Time: 09/29/20  6:14 PM  Result Value Ref Range   Neutrophils Relative % 43 %   Neutro Abs 1.8 1.7 - 7.7 K/uL   Lymphocytes Relative 40 %   Lymphs Abs 1.6 0.7 - 4.0 K/uL   Monocytes Relative 10 %   Monocytes Absolute 0.4 0.1 - 1.0 K/uL   Eosinophils Relative 6 %   Eosinophils Absolute 0.3 0.0 - 0.5 K/uL   Basophils Relative 1 %   Basophils Absolute 0.0 0.0 - 0.1 K/uL   Immature Granulocytes 0 %   Abs Immature  Granulocytes 0.01 0.00 - 0.07 K/uL   Acanthocytes PRESENT    Tear Drop Cells PRESENT    Polychromasia PRESENT    Target Cells PRESENT    Ovalocytes PRESENT    Giant PLTs PRESENT     Comment: Performed at [facility], [place] [address], [place], [place] [address]  Comprehensive metabolic panel     Status: Abnormal   Collection Time: 09/29/20  6:14 PM  Result Value Ref Range   Sodium 133 (L) 135 - 145 mmol/L   Potassium 3.5 3.5 - 5.1 mmol/L   Chloride 105 98 - 111 mmol/L   CO2 19 (L) 22 - 32 mmol/L   Glucose, Bld 114 (H) 70 - 99 mg/dL    Comment: Glucose reference range applies only to samples taken after fasting for at least 8 hours.   BUN 10 6 - 20 mg/dL   Creatinine, Ser 0.57 0.44 - 1.00 mg/dL   Calcium 8.7 (L) 8.9 - 10.3 mg/dL   Total Protein 9.7 (H) 6.5 - 8.1 g/dL   Albumin 3.6 3.5 - 5.0 g/dL   AST 41 15 - 41 U/L   ALT 27 0 - 44 U/L   Alkaline Phosphatase 66 38 - 126 U/L   Total Bilirubin 0.6 0.3 - 1.2 mg/dL   GFR, Estimated >60 >60 mL/min    Comment: (NOTE) Calculated using the CKD-EPI Creatinine Equation (2021)    Anion gap 9 5 - 15    Comment: Performed at [facility], [place] [address] [place], [place] [address]  CBG monitoring, ED     Status: Abnormal   Collection Time: 09/29/20  6:14 PM  Result Value Ref Range   Glucose-Capillary 107 (H) 70 - 99 mg/dL    Comment: Glucose reference range applies only to samples taken after fasting for at least 8 hours.  ABO/Rh     Status: None   Collection Time: 09/29/20  6:14 PM  Result Value Ref Range   ABO/RH(D)      A POS Performed at [facility] [address], [place], [place] [address]   I-stat chem 8, ED     Status: Abnormal   Collection Time: 09/29/20  6:22 PM  Result Value Ref Range   Sodium 139 135 - 145 mmol/L   Potassium 3.5 3.5 - 5.1 mmol/L   Chloride 106 98 - 111 mmol/L   BUN 10 6 - 20 mg/dL   Creatinine, Ser 0.50 0.44 - 1.00 mg/dL   Glucose,  Bld 111 (H) 70 - 99 mg/dL    Comment: Glucose  reference range applies only to samples taken after fasting for at least 8 hours.   Calcium, Ion 1.07 (L) 1.15 - 1.40 mmol/L   TCO2 21 (L) 22 - 32 mmol/L   Hemoglobin 8.5 (L) 12.0 - 15.0 g/dL   HCT 25.0 (L) 36.0 - 46.0 %  I-Stat beta hCG blood, ED     Status: None   Collection Time: 09/29/20  6:23 PM  Result Value Ref Range   I-stat hCG, quantitative <5.0 <5 mIU/mL   Comment 3            Comment:   GEST. AGE      CONC.  (mIU/mL)   <=1 WEEK        5 - 50     2 WEEKS       50 - 500     3 WEEKS       100 - 10,000     4 WEEKS     1,000 - 30,000        FEMALE AND NON-PREGNANT FEMALE:     LESS THAN 5 mIU/mL   Prepare RBC (crossmatch)     Status: None   Collection Time: 09/29/20  7:09 PM  Result Value Ref Range   Order Confirmation      ORDER PROCESSED BY BLOOD BANK Performed at [facility], [place] [address] [place], [place] [address]   Type and screen [place]     Status: None (Preliminary result)   Collection Time: 09/29/20  7:46 PM  Result Value Ref Range   ABO/RH(D) A POS    Antibody Screen NEG    Sample Expiration 10/02/2020,2359    Unit Number [id number]    Blood Component Type RED CELLS,LR    Unit division 00    Status of Unit ISSUED    Transfusion Status OK TO TRANSFUSE    Crossmatch Result      Compatible Performed at [facility], [place] [address], [place], [place] [address]   CBG monitoring, ED     Status: None   Collection Time: 09/29/20 10:35 PM  Result Value Ref Range   Glucose-Capillary 90 70 - 99 mg/dL    Comment: Glucose reference range applies only to samples taken after fasting for at least 8 hours.  Resp Panel by RT-PCR (Flu A&B, Covid) Nasopharyngeal Swab     Status: None   Collection Time: 09/29/20 10:45 PM   Specimen: Nasopharyngeal Swab; Nasopharyngeal(NP) swabs in vial transport medium  Result Value Ref Range   SARS Coronavirus 2 by RT PCR NEGATIVE NEGATIVE    Comment: (NOTE) SARS-CoV-2 target nucleic acids are NOT  DETECTED.  The SARS-CoV-2 RNA is generally detectable in upper respiratory specimens during the acute phase of infection. The lowest concentration of SARS-CoV-2 viral copies this assay can detect is 138 copies/mL. A negative result does not preclude SARS-Cov-2 infection and should not be used as the sole basis for treatment or other patient management decisions. A negative result may occur with  improper specimen collection/handling, submission of specimen other than nasopharyngeal swab, presence of viral mutation(s) within the areas targeted by this assay, and inadequate number of viral copies(<138 copies/mL). A negative result must be combined with clinical observations, patient history, and epidemiological information. The expected result is Negative.  Fact Sheet for Patients:  EntrepreneurPulse.com.au  Fact Sheet for Healthcare Providers:  IncredibleEmployment.be  This test is no t  yet approved or cleared by the [place] and  has been authorized for detection and/or diagnosis of SARS-CoV-2 by FDA under an Emergency Use Authorization (EUA). This EUA will remain  in effect (meaning this test can be used) for the duration of the COVID-19 declaration under Section 564(b)(1) of the Act, 21 U.S.C.section 360bbb-3(b)(1), unless the authorization is terminated  or revoked sooner.       Influenza A by PCR NEGATIVE NEGATIVE   Influenza B by PCR NEGATIVE NEGATIVE    Comment: (NOTE) The Xpert Xpress SARS-CoV-2/FLU/RSV plus assay is intended as an aid in the diagnosis of influenza from Nasopharyngeal swab specimens and should not be used as a sole basis for treatment. Nasal washings and aspirates are unacceptable for Xpert Xpress SARS-CoV-2/FLU/RSV testing.  Fact Sheet for Patients: EntrepreneurPulse.com.au  Fact Sheet for Healthcare Providers: IncredibleEmployment.be  This test is not yet approved or  cleared by the [place] FDA and has been authorized for detection and/or diagnosis of SARS-CoV-2 by FDA under an Emergency Use Authorization (EUA). This EUA will remain in effect (meaning this test can be used) for the duration of the COVID-19 declaration under Section 564(b)(1) of the Act, 21 U.S.C. section 360bbb-3(b)(1), unless the authorization is terminated or revoked.  Performed at [facility], [place] [address], [place], [place] [address]   MRSA PCR Screening     Status: None   Collection Time: 09/30/20  1:07 AM   Specimen: Nasal Mucosa; Nasopharyngeal  Result Value Ref Range   MRSA by PCR NEGATIVE NEGATIVE    Comment:        The GeneXpert MRSA Assay (FDA approved for NASAL specimens only), is one component of a comprehensive MRSA colonization surveillance program. It is not intended to diagnose MRSA infection nor to guide or monitor treatment for MRSA infections. Performed at [facility], [place] [address] [place],  [address]   Comprehensive metabolic panel     Status: Abnormal   Collection Time: 09/30/20  5:55 AM  Result Value Ref Range   Sodium 136 135 - 145 mmol/L   Potassium 3.8 3.5 - 5.1 mmol/L   Chloride 109 98 - 111 mmol/L   CO2 20 (L) 22 - 32 mmol/L   Glucose, Bld 104 (H) 70 - 99 mg/dL    Comment: Glucose reference range applies only to samples taken after fasting for at least 8 hours.   BUN 6 6 - 20 mg/dL   Creatinine, Ser 0.48 0.44 - 1.00 mg/dL   Calcium 8.5 (L) 8.9 - 10.3 mg/dL   Total Protein 9.0 (H) 6.5 - 8.1 g/dL   Albumin 3.2 (L) 3.5 - 5.0 g/dL   AST 33 15 - 41 U/L   ALT 24 0 - 44 U/L   Alkaline Phosphatase 60 38 - 126 U/L   Total Bilirubin 0.7 0.3 - 1.2 mg/dL   GFR, Estimated >60 >60 mL/min    Comment: (NOTE) Calculated using the CKD-EPI Creatinine Equation (2021)    Anion gap 7 5 - 15    Comment: Performed at [facility], [place] [address], [place], [place] [address]  Glucose, capillary     Status: Abnormal    Collection Time: 09/30/20  6:04 AM  Result Value Ref Range   Glucose-Capillary 110 (H) 70 - 99 mg/dL    Comment: Glucose reference range applies only to samples taken after fasting for at least 8 hours.    CT ANGIO HEAD W OR WO CONTRAST  Result  Date: 09/29/2020 CLINICAL DATA:  Acute neuro deficit, aphasia.  Right-sided deficit EXAM: CT ANGIOGRAPHY HEAD AND NECK CT PERFUSION BRAIN TECHNIQUE: Multidetector CT imaging of the head and neck was performed using the standard protocol during bolus administration of intravenous contrast. Multiplanar CT image reconstructions and MIPs were obtained to evaluate the vascular anatomy. Carotid stenosis measurements (when applicable) are obtained utilizing NASCET criteria, using the distal internal carotid diameter as the denominator. Multiphase CT imaging of the brain was performed following IV bolus contrast injection. Subsequent parametric perfusion maps were calculated using RAPID software. CONTRAST:  119mL OMNIPAQUE IOHEXOL 350 MG/ML SOLN COMPARISON:  CT head 09/29/2020 FINDINGS: CTA NECK FINDINGS Aortic arch: Standard branching. Imaged portion shows no evidence of aneurysm or dissection. No significant stenosis of the major arch vessel origins. Right carotid system: Normal right carotid. Negative for atherosclerotic disease. Left carotid system: Negative left carotid. Negative for atherosclerotic disease or stenosis. Vertebral arteries: Normal vertebral arteries bilaterally. Skeleton: No acute skeletal abnormality. Other neck: Negative for mass or enlarged lymph node. Upper chest: Mild ground-glass density in the right upper lobe without focal infiltrate or mass. Review of the MIP images confirms the above findings CTA HEAD FINDINGS Anterior circulation: Normal anterior circulation. Negative for stenosis or large vessel occlusion. Posterior circulation: Normal posterior circulation. Negative for stenosis or large vessel occlusion. Venous sinuses: Normal venous  enhancement. Anatomic variants: None Review of the MIP images confirms the above findings CT Brain Perfusion Findings: ASPECTS: 10 CBF (<30%) Volume: 60mL Perfusion (Tmax>6.0s) volume: 80mL Mismatch Volume: 77mL Infarction Location:Patchy decreased perfusion in the left frontal lobe and high right frontal lobe. IMPRESSION: Normal CT angiogram head neck. No significant atherosclerotic disease. No intracranial large vessel occlusion CT perfusion demonstrates small patchy areas of diminished perfusion left frontal and right frontal lobe of uncertain significance. No fixed infarct. Electronically Signed   By: Franchot Gallo M.D.   On: 09/29/2020 18:55   CT ANGIO NECK W OR WO CONTRAST  Result Date: 09/29/2020 CLINICAL DATA:  Acute neuro deficit, aphasia.  Right-sided deficit EXAM: CT ANGIOGRAPHY HEAD AND NECK CT PERFUSION BRAIN TECHNIQUE: Multidetector CT imaging of the head and neck was performed using the standard protocol during bolus administration of intravenous contrast. Multiplanar CT image reconstructions and MIPs were obtained to evaluate the vascular anatomy. Carotid stenosis measurements (when applicable) are obtained utilizing NASCET criteria, using the distal internal carotid diameter as the denominator. Multiphase CT imaging of the brain was performed following IV bolus contrast injection. Subsequent parametric perfusion maps were calculated using RAPID software. CONTRAST:  176mL OMNIPAQUE IOHEXOL 350 MG/ML SOLN COMPARISON:  CT head 09/29/2020 FINDINGS: CTA NECK FINDINGS Aortic arch: Standard branching. Imaged portion shows no evidence of aneurysm or dissection. No significant stenosis of the major arch vessel origins. Right carotid system: Normal right carotid. Negative for atherosclerotic disease. Left carotid system: Negative left carotid. Negative for atherosclerotic disease or stenosis. Vertebral arteries: Normal vertebral arteries bilaterally. Skeleton: No acute skeletal abnormality. Other neck:  Negative for mass or enlarged lymph node. Upper chest: Mild ground-glass density in the right upper lobe without focal infiltrate or mass. Review of the MIP images confirms the above findings CTA HEAD FINDINGS Anterior circulation: Normal anterior circulation. Negative for stenosis or large vessel occlusion. Posterior circulation: Normal posterior circulation. Negative for stenosis or large vessel occlusion. Venous sinuses: Normal venous enhancement. Anatomic variants: None Review of the MIP images confirms the above findings CT Brain Perfusion Findings: ASPECTS: 10 CBF (<30%) Volume: 46mL Perfusion (Tmax>6.0s) volume: 34mL Mismatch Volume:  6mL Infarction Location:Patchy decreased perfusion in the left frontal lobe and high right frontal lobe. IMPRESSION: Normal CT angiogram head neck. No significant atherosclerotic disease. No intracranial large vessel occlusion CT perfusion demonstrates small patchy areas of diminished perfusion left frontal and right frontal lobe of uncertain significance. No fixed infarct. Electronically Signed   By: Franchot Gallo M.D.   On: 09/29/2020 18:55   MR Brain W and Wo Contrast  Result Date: 09/30/2020 CLINICAL DATA:  Right-sided facial droop with aphasia. Left-sided body weakness EXAM: MRI HEAD WITHOUT AND WITH CONTRAST TECHNIQUE: Multiplanar, multiecho pulse sequences of the brain and surrounding structures were obtained without and with intravenous contrast. CONTRAST:  43mL GADAVIST GADOBUTROL 1 MMOL/ML IV SOLN COMPARISON:  CT and CTA from yesterday FINDINGS: Brain: 2 ring-enhancing comma shaped brain lesions located along the gray-white matter junction of the posterior left temporal lobe and lateral left frontal lobe, measuring up to 21 mm in maximal span at the posterior temporal lobe. No associated restricted diffusion to imply pyogenic abscess. No discontinuous enhancement to imply tumefactive demyelination. Both lesions have a very similar morphology with a fairly smoothly  contoured rim and adjacent nodular thickening with infolding of the wall. There is extensive adjacent vasogenic edema with local mass effect and mild rightward midline shift. No acute infarct, hydrocephalus, or acute hemorrhage. Small presumed arachnoid cyst is seen at the right vertex with mild local mass effect. Vascular: Preserved flow voids and vascular enhancement Skull and upper cervical spine: Normal marrow signal. Sinuses/Orbits: Opacified right frontal sinus with proteinaceous features. 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 Electronically Signed   By: Monte Fantasia M.D.   On: 09/30/2020 05:32   CT CHEST ABDOMEN PELVIS W CONTRAST  Result Date: 09/29/2020 CLINICAL DATA:  Intracranial mass, rule out primary lesion EXAM: CT CHEST, ABDOMEN, AND PELVIS WITH CONTRAST TECHNIQUE: Multidetector CT imaging of the chest, abdomen and pelvis was performed following the standard protocol during bolus administration of intravenous contrast. CONTRAST:  55mL OMNIPAQUE IOHEXOL 300 MG/ML  SOLN COMPARISON:  None. FINDINGS: CT CHEST FINDINGS Cardiovascular: Thoracic aorta and its branches are within normal limits. No aneurysmal dilatation is seen. The pulmonary artery as visualized is within normal limits. Heart is not significantly enlarged. Mediastinum/Nodes: Thoracic inlet is within normal limits. No hilar or mediastinal adenopathy is noted. The esophagus as visualized is within normal limits. Lungs/Pleura: The lungs are well aerated bilaterally. Mild dependent atelectatic changes are seen. No focal parenchymal nodule is noted. No sizable effusion or pneumothorax is noted. Musculoskeletal: Degenerative changes of the thoracic spine are noted. No  rib abnormality is seen. No specific breast mass is noted. CT ABDOMEN PELVIS FINDINGS Hepatobiliary: No focal liver abnormality is seen. No gallstones, gallbladder wall thickening, or biliary dilatation. Pancreas: Unremarkable. No pancreatic ductal dilatation or surrounding inflammatory changes. Spleen: Normal in size without focal abnormality. Adrenals/Urinary Tract: Adrenal glands are within normal limits. Kidneys demonstrate a normal enhancement pattern with normal excretion bilaterally. No obstructive changes are seen. Bladder is well distended with opacified urine consistent with prior contrast administration. Stomach/Bowel: No obstructive or inflammatory changes of the colon are noted. The appendix is well visualized and within normal limits. Small bowel and stomach appear within normal limits. Vascular/Lymphatic: No significant vascular findings are present. No enlarged abdominal or pelvic lymph nodes.  Reproductive: Uterus is well visualized and demonstrates bulky enlargement with multifocal lesions. The largest of these measures at least 10 cm in dimension. These likely represent uterine fibroids although the possibility of a more aggressive lesion could not be totally excluded. Other: Adjacent to the rectum on the right there is a somewhat multiloculated cystic structure which measures approximately 4.6 by 3.0 cm. This is of uncertain etiology. Possibility of small perirectal abscess would deserve consideration. Alternatively this could represent a cystic metastatic lesion. Musculoskeletal: No acute bony abnormality is noted. IMPRESSION: CT of the chest: No acute intrathoracic abnormality is noted. CT of the abdomen and pelvis: Significantly enlarged uterus with multiple uterine lesions within. These may simply represent uterine fibroids although the possibility of a more aggressive process could not be excluded given the changes in the head on recent CT. MRI would be helpful on a nonemergent basis when the  patient's condition has improved for further evaluation. Cystic structure adjacent to the rectum on the right of uncertain significance and etiology. This could also be evaluated with MRI of the pelvis. No other focal abnormality is noted. Electronically Signed   By: Inez Catalina M.D.   On: 09/29/2020 20:45   CT CEREBRAL PERFUSION W CONTRAST  Result Date: 09/29/2020 CLINICAL DATA:  Acute neuro deficit, aphasia.  Right-sided deficit EXAM: CT ANGIOGRAPHY HEAD AND NECK CT PERFUSION BRAIN TECHNIQUE: Multidetector CT imaging of the head and neck was performed using the standard protocol during bolus administration of intravenous contrast. Multiplanar CT image reconstructions and MIPs were obtained to evaluate the vascular anatomy. Carotid stenosis measurements (when applicable) are obtained utilizing NASCET criteria, using the distal internal carotid diameter as the denominator. Multiphase CT imaging of the brain was performed following IV bolus contrast injection. Subsequent parametric perfusion maps were calculated using RAPID software. CONTRAST:  181mL OMNIPAQUE IOHEXOL 350 MG/ML SOLN COMPARISON:  CT head 09/29/2020 FINDINGS: CTA NECK FINDINGS Aortic arch: Standard branching. Imaged portion shows no evidence of aneurysm or dissection. No significant stenosis of the major arch vessel origins. Right carotid system: Normal right carotid. Negative for atherosclerotic disease. Left carotid system: Negative left carotid. Negative for atherosclerotic disease or stenosis. Vertebral arteries: Normal vertebral arteries bilaterally. Skeleton: No acute skeletal abnormality. Other neck: Negative for mass or enlarged lymph node. Upper chest: Mild ground-glass density in the right upper lobe without focal infiltrate or mass. Review of the MIP images confirms the above findings CTA HEAD FINDINGS Anterior circulation: Normal anterior circulation. Negative for stenosis or large vessel occlusion. Posterior circulation: Normal  posterior circulation. Negative for stenosis or large vessel occlusion. Venous sinuses: Normal venous enhancement. Anatomic variants: None Review of the MIP images confirms the above findings CT Brain Perfusion Findings: ASPECTS: 10 CBF (<30%) Volume: 13mL Perfusion (Tmax>6.0s) volume: 47mL Mismatch Volume: 74mL Infarction Location:Patchy decreased perfusion in the left frontal lobe and high right frontal lobe. IMPRESSION: Normal CT angiogram head neck. No significant atherosclerotic disease. No intracranial large vessel occlusion CT perfusion demonstrates small patchy areas of diminished perfusion left frontal and right frontal lobe of uncertain significance. No fixed infarct. Electronically Signed   By: Franchot Gallo M.D.   On: 09/29/2020 18:55   CT HEAD CODE STROKE WO CONTRAST  Addendum Date: 09/29/2020   ADDENDUM REPORT: 09/29/2020 18:36 ADDENDUM: These results were called by telephone at the time of interpretation on 09/29/2020 at 6:36 pm to provider [facility] , who verbally acknowledged these results. Code stroke imaging results were communicated on 09/29/2020 at 6:36 pm to provider [facility]  via text page Electronically Signed   By: Franchot Gallo M.D.   On: 09/29/2020 18:36   Result Date: 09/29/2020 CLINICAL DATA:  Code stroke. Acute neuro deficit. Right-sided deficit, aphasia EXAM: CT HEAD WITHOUT CONTRAST TECHNIQUE: Contiguous axial images were obtained from the base of the skull through the vertex without intravenous contrast. COMPARISON:  None. FINDINGS: Brain: Moderate amount of white matter edema is present in the left temporal lobe and left posterior frontal lobe with sparing of the cortex. There is mass-effect and 6 mm midline shift to the right. No edema on the right side. No hemorrhage. Ventricle size normal. Vascular: Negative for hyperdense vessel Skull: Negative Sinuses/Orbits: Mild mucosal edema paranasal sinuses. Negative orbit Other: None ASPECTS ([place] Stroke Program Early CT Score)  - Ganglionic level infarction (caudate, lentiform nuclei, internal capsule, insula, M1-M3 cortex): 7 - Supraganglionic infarction (M4-M6 cortex): 3 Total score (0-10 with 10 being normal): 10 IMPRESSION: 1. Moderate amount of white matter edema in the left frontal and temporal lobe with sparing of cortex. Pattern is most likely due to tumor. Possible metastatic disease or primary brain tumor. Recommend MRI brain without and with contrast 2. ASPECTS is 10 Electronically Signed: By: Franchot Gallo M.D. On: 09/29/2020 18:33    ROS: As above Blood pressure 119/82, pulse 65, temperature 97.9 F (36.6 C), temperature source Oral, resp. rate 20, weight 89.8 kg, SpO2 100 %. Estimated body mass index is 30 kg/m as calculated from the following:   Height as of 04/04/19: 5' 8.11" (1.73 m).   Weight as of this encounter: 89.8 kg.  Physical Exam  General: A somnolent but easily arousable 40 year old black female in no apparent distress.  HEENT: Normocephalic, atraumatic, extraocular muscles are intact, her pupils are equal.  Neck: Unremarkable  Thorax: Unremarkable  Abdomen: Soft  Extremities: Unremarkable  Neurologic exam: The patient is somnolent but arousable.  Glasgow Coma Scale 12: E3M6V3.  She moves all 4 extremities.  The exam is somewhat limited by language barrier and her somnolence.  She is right hemiparetic.  I do not see any obvious cranial nerve deficits.  I have reviewed the patient's brain MRI.  She has a left posterior frontal and left posterior temporal enhancing lesion with surrounding edema and mass-effect.  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  Her HIV is pending.  If she is HIV positive I would recommend an ID consult and start empiric treatment for toxoplasmosis.  If she is HIV negative we may need to biopsy or remove one of the lesions  for diagnosis.  She has been started on Decadron and Keppra.  Ophelia Charter 09/30/2020, 8:13 AM

## 2020-09-30 NOTE — Progress Notes (Signed)
EEG completed, results pending. 
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
## 2020-09-30 NOTE — Plan of Care (Signed)

## 2020-09-30 NOTE — Progress Notes (Signed)
PROGRESS NOTE    Candace Jones  XBM:[id number] DOB: [birth year] DOA: 09/29/2020 PCP: Patient, No Pcp Per (Inactive)   Chief Complain: Aphasia, right-sided weakness  Brief Narrative: Patient is a 40 year old female from [place] with no significant past medical history was brought to the emergency department with complaints of aphasia, right facial droop, right-sided weakness.  She was also found to be confused and code stroke was called in the emergency department.  She was also complaining of mild headache since last few days, also complained of severe menorrhagia.  She also had a generalized tonic-clonic seizure in the emergency department.  CT head done in the emergency department showed possible left-sided brain mass with edema. 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   metastatic process.  Neurosurgery, neurology consulted.  Planning for MRI of the abdomen and pelvis today. She was loaded with Keppra on presentation.  Lab work also showed hemoglobin of 6, transfused with PRBC.  Assessment & Plan:   Principal Problem:   Intracranial mass Active Problems:   Seizure (HCC)   Microcytic hypochromic anemia   Brain mass   Intracranial mass:  CT head done in the emergency department showed possible left-sided brain mass with edema. 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  Possible neoplasm.  Neurosurgery, neurology consulted.  She was loaded with Keppra on presentation.  Also started on Decadron. Will check HIV.  Neurosurgery thinks that this lesions might be from toxoplasmosis if HIV is positive.  If HIV is negative, neurosurgery planning for biopsy of the lesions.  Uterine mass: CT abd/pelvis showed  significantly enlarged uterus with multiple uterine lesions within,cystic structure adjacent to the rectum on the right of uncertain  significance and etiology.  Will check  MRI of the abdomen/pelvis today. We will involve GYN oncology if needed.  Acute metabolic encephalopathy/seizures: Likely precipitated by intracranial mass.  Currently on Keppra.  She was postictal on presentation.  Microcytic hypochromic anemia: No previous labs to compare.  She complained of menorrhagia and had heavy menstrual cycles.  She was transfused 2 units of PRBC on presentation.  Monitor CBC         DVT prophylaxis:SCD Code Status: Full Family Communication: Sister at the bedside Status is: Inpatient  Remains inpatient appropriate because:Inpatient level of care appropriate due to severity of illness   Dispo: The patient is from: Home              Anticipated d/c is to: Home              Patient currently is not medically stable to d/c.   Difficult to place patient No     Consultants: Neurology, neurosurgery  Procedures:  Antimicrobials:  Anti-infectives (From admission, onward)   None      Subjective: Patient seen and examined the bedside this morning.  Hemodynamically stable during evaluation.  She was alert and awake and mostly oriented.  She speaks African language.  Sister was at the bedside who speaks excellent [place].  She denied any complaints during my evaluation.  States she is hungry  Objective: Vitals:   09/30/20 0111 09/30/20 0230 09/30/20 0241 09/30/20 0500  BP:   134/78 119/82  Pulse: 69 66 70 65  Resp: (!) 21 20 20 20   Temp:   97.9 F (36.6 C)   TempSrc:   Oral   SpO2: 100% 100% 100% 100%  Weight:        Intake/Output Summary (Last 24 hours) at 09/30/2020 0751 Last data filed at 09/30/2020 0230 Gross per 24 hour  Intake 315 ml  Output --  Net 315 ml   Filed Weights   09/29/20 1925  Weight: 89.8 kg    Examination:  General exam: Appears calm and comfortable ,Not in distress,average built HEENT:PERRL,Oral mucosa moist, Ear/Nose normal on gross exam Respiratory system: Bilateral equal  air entry, normal vesicular breath sounds, no wheezes or crackles  Cardiovascular system: S1 & S2 heard, RRR. No JVD, murmurs, rubs, gallops or clicks. No pedal edema. Gastrointestinal system: Abdomen is distended, soft and nontender.  Palpable mass in the pelvis/lower abdomen. Normal bowel sounds heard. Central nervous system: Alert and oriented. No focal neurological deficits. Extremities: No edema, no clubbing ,no cyanosis, distal peripheral pulses palpable. Skin: No rashes, lesions or ulcers,no icterus ,no pallor   Data Reviewed: I have personally reviewed following labs and imaging studies  CBC: Recent Labs  Lab 09/29/20 1814 09/29/20 1822  WBC 4.1  --   NEUTROABS 1.8  --   HGB 6.6* 8.5*  HCT 24.3* 25.0*  MCV 61.5*  --   PLT 213  --    Basic Metabolic Panel: Recent Labs  Lab 09/29/20 1814 09/29/20 1822 09/30/20 0555  NA 133* 139 136  K 3.5 3.5 3.8  CL 105 106 109  CO2 19*  --  20*  GLUCOSE 114* 111* 104*  BUN 10 10 6   CREATININE 0.57 0.50 0.48  CALCIUM 8.7*  --  8.5*   GFR: CrCl cannot be calculated (Unknown ideal weight.). Liver Function Tests: Recent Labs  Lab 09/29/20 1814 09/30/20 0555  AST 41 33  ALT 27 24  ALKPHOS 66 60  BILITOT 0.6 0.7  PROT 9.7* 9.0*  ALBUMIN 3.6 3.2*   No results for input(s): LIPASE, AMYLASE in the last 168 hours. No results for input(s): AMMONIA in the last 168 hours. Coagulation Profile: Recent Labs  Lab 09/29/20 1814  INR 1.1   Cardiac Enzymes: No results for input(s): CKTOTAL, CKMB, CKMBINDEX, TROPONINI in the last 168 hours. BNP (last 3 results) No results for input(s): PROBNP in the last 8760 hours. HbA1C: No results for input(s): HGBA1C in the last 72 hours. CBG: Recent Labs  Lab 09/29/20 1814 09/29/20 2235 09/30/20 0604  GLUCAP 107* 90 110*   Lipid Profile: No results for input(s): CHOL, HDL, LDLCALC, TRIG, CHOLHDL, LDLDIRECT in the last 72 hours. Thyroid Function Tests: No results for input(s): TSH,  T4TOTAL, FREET4, T3FREE, THYROIDAB in the last 72 hours. Anemia Panel: No results for input(s): VITAMINB12, FOLATE, FERRITIN, TIBC, IRON, RETICCTPCT in the last 72 hours. Sepsis Labs: No results for input(s): PROCALCITON, LATICACIDVEN in the last 168 hours.  Recent Results (from the past 240 hour(s))  Resp Panel by RT-PCR (Flu A&B, Covid) Nasopharyngeal Swab     Status: None   Collection Time: 09/29/20 10:45 PM   Specimen: Nasopharyngeal Swab; Nasopharyngeal(NP) swabs in vial transport medium  Result Value Ref Range Status   SARS Coronavirus 2 by RT PCR NEGATIVE NEGATIVE Final    Comment: (NOTE) SARS-CoV-2 target nucleic acids are NOT DETECTED.  The SARS-CoV-2 RNA is generally detectable in upper respiratory specimens during the acute phase of infection. The lowest concentration of SARS-CoV-2 viral copies this assay can detect is 138 copies/mL. A negative result does not preclude SARS-Cov-2 infection and should not be used as the sole basis for treatment or other patient management decisions. A negative  result may occur with  improper specimen collection/handling, submission of specimen other than nasopharyngeal swab, presence of viral mutation(s) within the areas targeted by this assay, and inadequate number of viral copies(<138 copies/mL). A negative result must be combined with clinical observations, patient history, and epidemiological information. The expected result is Negative.  Fact Sheet for Patients:  EntrepreneurPulse.com.au  Fact Sheet for Healthcare Providers:  IncredibleEmployment.be  This test is no t yet approved or cleared by the [place] FDA and  has been authorized for detection and/or diagnosis of SARS-CoV-2 by FDA under an Emergency Use Authorization (EUA). This EUA will remain  in effect (meaning this test can be used) for the duration of the COVID-19 declaration under Section 564(b)(1) of the Act, 21 U.S.C.section  360bbb-3(b)(1), unless the authorization is terminated  or revoked sooner.       Influenza A by PCR NEGATIVE NEGATIVE Final   Influenza B by PCR NEGATIVE NEGATIVE Final    Comment: (NOTE) The Xpert Xpress SARS-CoV-2/FLU/RSV plus assay is intended as an aid in the diagnosis of influenza from Nasopharyngeal swab specimens and should not be used as a sole basis for treatment. Nasal washings and aspirates are unacceptable for Xpert Xpress SARS-CoV-2/FLU/RSV testing.  Fact Sheet for Patients: EntrepreneurPulse.com.au  Fact Sheet for Healthcare Providers: IncredibleEmployment.be  This test is not yet approved or cleared by the [place] FDA and has been authorized for detection and/or diagnosis of SARS-CoV-2 by FDA under an Emergency Use Authorization (EUA). This EUA will remain in effect (meaning this test can be used) for the duration of the COVID-19 declaration under Section 564(b)(1) of the Act, 21 U.S.C. section 360bbb-3(b)(1), unless the authorization is terminated or revoked.  Performed at [facility], [place] [address] [place], [place] [address]   MRSA PCR Screening     Status: None   Collection Time: 09/30/20  1:07 AM   Specimen: Nasal Mucosa; Nasopharyngeal  Result Value Ref Range Status   MRSA by PCR NEGATIVE NEGATIVE Final    Comment:        The GeneXpert MRSA Assay (FDA approved for NASAL specimens only), is one component of a comprehensive MRSA colonization surveillance program. It is not intended to diagnose MRSA infection nor to guide or monitor treatment for MRSA infections. Performed at [facility], [place] [address], [place], [place] [address]          Radiology Studies: CT ANGIO HEAD W OR WO CONTRAST  Result Date: 09/29/2020 CLINICAL DATA:  Acute neuro deficit, aphasia.  Right-sided deficit EXAM: CT ANGIOGRAPHY HEAD AND NECK CT PERFUSION BRAIN TECHNIQUE: Multidetector CT imaging of the head and neck  was performed using the standard protocol during bolus administration of intravenous contrast. Multiplanar CT image reconstructions and MIPs were obtained to evaluate the vascular anatomy. Carotid stenosis measurements (when applicable) are obtained utilizing NASCET criteria, using the distal internal carotid diameter as the denominator. Multiphase CT imaging of the brain was performed following IV bolus contrast injection. Subsequent parametric perfusion maps were calculated using RAPID software. CONTRAST:  180mL OMNIPAQUE IOHEXOL 350 MG/ML SOLN COMPARISON:  CT head 09/29/2020 FINDINGS: CTA NECK FINDINGS Aortic arch: Standard branching. Imaged portion shows no evidence of aneurysm or dissection. No significant stenosis of the major arch vessel origins. Right carotid system: Normal right carotid. Negative for atherosclerotic disease. Left carotid system: Negative left carotid. Negative for atherosclerotic disease or stenosis. Vertebral arteries: Normal vertebral arteries bilaterally. Skeleton: No acute skeletal abnormality. Other neck: Negative for mass or enlarged  lymph node. Upper chest: Mild ground-glass density in the right upper lobe without focal infiltrate or mass. Review of the MIP images confirms the above findings CTA HEAD FINDINGS Anterior circulation: Normal anterior circulation. Negative for stenosis or large vessel occlusion. Posterior circulation: Normal posterior circulation. Negative for stenosis or large vessel occlusion. Venous sinuses: Normal venous enhancement. Anatomic variants: None Review of the MIP images confirms the above findings CT Brain Perfusion Findings: ASPECTS: 10 CBF (<30%) Volume: 52mL Perfusion (Tmax>6.0s) volume: 75mL Mismatch Volume: 65mL Infarction Location:Patchy decreased perfusion in the left frontal lobe and high right frontal lobe. IMPRESSION: Normal CT angiogram head neck. No significant atherosclerotic disease. No intracranial large vessel occlusion CT perfusion  demonstrates small patchy areas of diminished perfusion left frontal and right frontal lobe of uncertain significance. No fixed infarct. Electronically Signed   By: Franchot Gallo M.D.   On: 09/29/2020 18:55   CT ANGIO NECK W OR WO CONTRAST  Result Date: 09/29/2020 CLINICAL DATA:  Acute neuro deficit, aphasia.  Right-sided deficit EXAM: CT ANGIOGRAPHY HEAD AND NECK CT PERFUSION BRAIN TECHNIQUE: Multidetector CT imaging of the head and neck was performed using the standard protocol during bolus administration of intravenous contrast. Multiplanar CT image reconstructions and MIPs were obtained to evaluate the vascular anatomy. Carotid stenosis measurements (when applicable) are obtained utilizing NASCET criteria, using the distal internal carotid diameter as the denominator. Multiphase CT imaging of the brain was performed following IV bolus contrast injection. Subsequent parametric perfusion maps were calculated using RAPID software. CONTRAST:  12mL OMNIPAQUE IOHEXOL 350 MG/ML SOLN COMPARISON:  CT head 09/29/2020 FINDINGS: CTA NECK FINDINGS Aortic arch: Standard branching. Imaged portion shows no evidence of aneurysm or dissection. No significant stenosis of the major arch vessel origins. Right carotid system: Normal right carotid. Negative for atherosclerotic disease. Left carotid system: Negative left carotid. Negative for atherosclerotic disease or stenosis. Vertebral arteries: Normal vertebral arteries bilaterally. Skeleton: No acute skeletal abnormality. Other neck: Negative for mass or enlarged lymph node. Upper chest: Mild ground-glass density in the right upper lobe without focal infiltrate or mass. Review of the MIP images confirms the above findings CTA HEAD FINDINGS Anterior circulation: Normal anterior circulation. Negative for stenosis or large vessel occlusion. Posterior circulation: Normal posterior circulation. Negative for stenosis or large vessel occlusion. Venous sinuses: Normal venous  enhancement. Anatomic variants: None Review of the MIP images confirms the above findings CT Brain Perfusion Findings: ASPECTS: 10 CBF (<30%) Volume: 66mL Perfusion (Tmax>6.0s) volume: 69mL Mismatch Volume: 83mL Infarction Location:Patchy decreased perfusion in the left frontal lobe and high right frontal lobe. IMPRESSION: Normal CT angiogram head neck. No significant atherosclerotic disease. No intracranial large vessel occlusion CT perfusion demonstrates small patchy areas of diminished perfusion left frontal and right frontal lobe of uncertain significance. No fixed infarct. Electronically Signed   By: Franchot Gallo M.D.   On: 09/29/2020 18:55   MR Brain W and Wo Contrast  Result Date: 09/30/2020 CLINICAL DATA:  Right-sided facial droop with aphasia. Left-sided body weakness EXAM: MRI HEAD WITHOUT AND WITH CONTRAST TECHNIQUE: Multiplanar, multiecho pulse sequences of the brain and surrounding structures were obtained without and with intravenous contrast. CONTRAST:  6mL GADAVIST GADOBUTROL 1 MMOL/ML IV SOLN COMPARISON:  CT and CTA from yesterday FINDINGS: Brain: 2 ring-enhancing comma shaped brain lesions located along the gray-white matter junction of the posterior left temporal lobe and lateral left frontal lobe, measuring up to 21 mm in maximal span at the posterior temporal lobe. No associated restricted diffusion to imply pyogenic abscess.  No discontinuous enhancement to imply tumefactive demyelination. Both lesions have a very similar morphology with a fairly smoothly contoured rim and adjacent nodular thickening with infolding of the wall. There is extensive adjacent vasogenic edema with local mass effect and mild rightward midline shift. No acute infarct, hydrocephalus, or acute hemorrhage. Small presumed arachnoid cyst is seen at the right vertex with mild local mass effect. Vascular: Preserved flow voids and vascular enhancement Skull and upper cervical spine: Normal marrow signal. Sinuses/Orbits:  Opacified right frontal sinus with proteinaceous features. 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 Electronically Signed   By: Monte Fantasia M.D.   On: 09/30/2020 05:32   CT CHEST ABDOMEN PELVIS W CONTRAST  Result Date: 09/29/2020 CLINICAL DATA:  Intracranial mass, rule out primary lesion EXAM: CT CHEST, ABDOMEN, AND PELVIS WITH CONTRAST TECHNIQUE: Multidetector CT imaging of the chest, abdomen and pelvis was performed following the standard protocol during bolus administration of intravenous contrast. CONTRAST:  48mL OMNIPAQUE IOHEXOL 300 MG/ML  SOLN COMPARISON:  None. FINDINGS: CT CHEST FINDINGS Cardiovascular: Thoracic aorta and its branches are within normal limits. No aneurysmal dilatation is seen. The pulmonary artery as visualized is within normal limits. Heart is not significantly enlarged. Mediastinum/Nodes: Thoracic inlet is within normal limits. No hilar or mediastinal adenopathy is noted. The esophagus as visualized is within normal limits. Lungs/Pleura: The lungs are well aerated bilaterally. Mild dependent atelectatic changes are seen. No focal parenchymal nodule is noted. No sizable effusion or pneumothorax is noted. Musculoskeletal: Degenerative changes of the thoracic spine are noted. No rib abnormality is seen. No specific breast mass is noted. CT ABDOMEN PELVIS FINDINGS Hepatobiliary: No focal liver abnormality is seen. No gallstones, gallbladder wall thickening, or biliary dilatation. Pancreas: Unremarkable. No pancreatic ductal dilatation or surrounding inflammatory changes. Spleen: Normal in size without focal abnormality. Adrenals/Urinary Tract: Adrenal glands are within normal limits. Kidneys demonstrate a normal  enhancement pattern with normal excretion bilaterally. No obstructive changes are seen. Bladder is well distended with opacified urine consistent with prior contrast administration. Stomach/Bowel: No obstructive or inflammatory changes of the colon are noted. The appendix is well visualized and within normal limits. Small bowel and stomach appear within normal limits. Vascular/Lymphatic: No significant vascular findings are present. No enlarged abdominal or pelvic lymph nodes. Reproductive: Uterus is well visualized and demonstrates bulky enlargement with multifocal lesions. The largest of these measures at least 10 cm in dimension. These likely represent uterine fibroids although the possibility of a more aggressive lesion could not be totally excluded. Other: Adjacent to the rectum on the right there is a somewhat multiloculated cystic structure which measures approximately 4.6 by 3.0 cm. This is of uncertain etiology. Possibility of small perirectal abscess would deserve consideration. Alternatively this could represent a cystic metastatic lesion. Musculoskeletal: No acute bony abnormality is noted. IMPRESSION: CT of the chest: No acute intrathoracic abnormality is noted. CT of the abdomen and pelvis: Significantly enlarged uterus with multiple uterine lesions within. These may simply represent uterine fibroids although the possibility of a more aggressive process could not be excluded given the changes in the head on recent CT. MRI would be helpful on a nonemergent basis when the patient's condition has improved for further evaluation. Cystic structure adjacent to the rectum on the right of uncertain significance and etiology. This  could also be evaluated with MRI of the pelvis. No other focal abnormality is noted. Electronically Signed   By: Inez Catalina M.D.   On: 09/29/2020 20:45   CT CEREBRAL PERFUSION W CONTRAST  Result Date: 09/29/2020 CLINICAL DATA:  Acute neuro deficit, aphasia.  Right-sided deficit  EXAM: CT ANGIOGRAPHY HEAD AND NECK CT PERFUSION BRAIN TECHNIQUE: Multidetector CT imaging of the head and neck was performed using the standard protocol during bolus administration of intravenous contrast. Multiplanar CT image reconstructions and MIPs were obtained to evaluate the vascular anatomy. Carotid stenosis measurements (when applicable) are obtained utilizing NASCET criteria, using the distal internal carotid diameter as the denominator. Multiphase CT imaging of the brain was performed following IV bolus contrast injection. Subsequent parametric perfusion maps were calculated using RAPID software. CONTRAST:  111mL OMNIPAQUE IOHEXOL 350 MG/ML SOLN COMPARISON:  CT head 09/29/2020 FINDINGS: CTA NECK FINDINGS Aortic arch: Standard branching. Imaged portion shows no evidence of aneurysm or dissection. No significant stenosis of the major arch vessel origins. Right carotid system: Normal right carotid. Negative for atherosclerotic disease. Left carotid system: Negative left carotid. Negative for atherosclerotic disease or stenosis. Vertebral arteries: Normal vertebral arteries bilaterally. Skeleton: No acute skeletal abnormality. Other neck: Negative for mass or enlarged lymph node. Upper chest: Mild ground-glass density in the right upper lobe without focal infiltrate or mass. Review of the MIP images confirms the above findings CTA HEAD FINDINGS Anterior circulation: Normal anterior circulation. Negative for stenosis or large vessel occlusion. Posterior circulation: Normal posterior circulation. Negative for stenosis or large vessel occlusion. Venous sinuses: Normal venous enhancement. Anatomic variants: None Review of the MIP images confirms the above findings CT Brain Perfusion Findings: ASPECTS: 10 CBF (<30%) Volume: 52mL Perfusion (Tmax>6.0s) volume: 72mL Mismatch Volume: 77mL Infarction Location:Patchy decreased perfusion in the left frontal lobe and high right frontal lobe. IMPRESSION: Normal CT angiogram  head neck. No significant atherosclerotic disease. No intracranial large vessel occlusion CT perfusion demonstrates small patchy areas of diminished perfusion left frontal and right frontal lobe of uncertain significance. No fixed infarct. Electronically Signed   By: Franchot Gallo M.D.   On: 09/29/2020 18:55   CT HEAD CODE STROKE WO CONTRAST  Addendum Date: 09/29/2020   ADDENDUM REPORT: 09/29/2020 18:36 ADDENDUM: These results were called by telephone at the time of interpretation on 09/29/2020 at 6:36 pm to provider [facility] , who verbally acknowledged these results. Code stroke imaging results were communicated on 09/29/2020 at 6:36 pm to provider Bhagat via text page Electronically Signed   By: Franchot Gallo M.D.   On: 09/29/2020 18:36   Result Date: 09/29/2020 CLINICAL DATA:  Code stroke. Acute neuro deficit. Right-sided deficit, aphasia EXAM: CT HEAD WITHOUT CONTRAST TECHNIQUE: Contiguous axial images were obtained from the base of the skull through the vertex without intravenous contrast. COMPARISON:  None. FINDINGS: Brain: Moderate amount of white matter edema is present in the left temporal lobe and left posterior frontal lobe with sparing of the cortex. There is mass-effect and 6 mm midline shift to the right. No edema on the right side. No hemorrhage. Ventricle size normal. Vascular: Negative for hyperdense vessel Skull: Negative Sinuses/Orbits: Mild mucosal edema paranasal sinuses. Negative orbit Other: None ASPECTS (Sharon Stroke Program Early CT Score) - Ganglionic level infarction (caudate, lentiform nuclei, internal capsule, insula, M1-M3 cortex): 7 - Supraganglionic infarction (M4-M6 cortex): 3 Total score (0-10 with 10 being normal): 10 IMPRESSION: 1. Moderate amount of white matter edema in the left frontal and temporal lobe with sparing of  cortex. Pattern is most likely due to tumor. Possible metastatic disease or primary brain tumor. Recommend MRI brain without and with contrast 2.  ASPECTS is 10 Electronically Signed: By: Franchot Gallo M.D. On: 09/29/2020 18:33        Scheduled Meds: . dexamethasone (DECADRON) injection  6 mg Intravenous Q6H  . sodium chloride flush  3 mL Intravenous Once   Continuous Infusions: . levETIRAcetam       LOS: 0 days    Time spent: More than 50% of that time was spent in counseling and/or coordination of care.      Shelly Coss, MD [facility] P4/19/2022, 7:51 AM

## 2020-09-30 NOTE — Procedures (Signed)
Patient Name: Sylena Lotter  MRN: [id number]  Epilepsy Attending: Lora Havens  Referring Physician/Provider: Dr. Gean Birchwood Date: 09/30/2020  Duration: 23.02 mins  Patient history: 40 year old female presented with sudden onset of aphasia right facial droop and right-sided weakness.  CT head showed possible left-sided brain mass with edema.  EEG to evaluate for seizures.  Level of alertness: Awake  AEDs during EEG study: Keppra  Technical aspects: This EEG study was done with scalp electrodes positioned according to the 10-20 International system of electrode placement. Electrical activity was acquired at a sampling rate of 500Hz  and reviewed with a high frequency filter of 70Hz  and a low frequency filter of 1Hz . EEG data were recorded continuously and digitally stored.   Description:  The posterior dominant rhythm consists of 9 Hz activity of moderate voltage (25-35 uV) seen predominantly in posterior head regions, symmetric and reactive to eye opening and eye closing. EEG also showed continuous rhythmic amplitude 2 to 3 Hz delta slowing in left temporal region. Additionally there is also continuous generalized polymorphic mixed frequencies with predominantly 5 to 8 Hz theta-alpha activity. Hyperventilation and photic stimulation were not performed.     ABNORMALITY - Lateralized rhythmic delta activity, left temporal region - Continuous slow, generalized  IMPRESSION: This study is suggestive of cortical dysfunction in left temporal region likely secondary underlying structural abnormality, ictal, postictal state.  Additionally there is evidence of moderate diffuse encephalopathy, nonspecific etiology.  No seizures or epileptiform discharges were seen throughout the recording.  Genia Perin Barbra Sarks

## 2020-09-30 NOTE — Plan of Care (Signed)
  Problem: Clinical Measurements: Goal: Will remain free from infection Outcome: Progressing Goal: Cardiovascular complication will be avoided Outcome: Progressing   

## 2020-09-30 NOTE — Evaluation (Signed)
Clinical/Bedside Swallow Evaluation Patient Details  Name: Candace Jones MRN: [id number] Date of Birth: 1981/05/16  Today's Date: 09/30/2020 Time: SLP Start Time (ACUTE ONLY): 1045 SLP Stop Time (ACUTE ONLY): 1105 SLP Time Calculation (min) (ACUTE ONLY): 20 min  Past Medical History: History reviewed. No pertinent past medical history. Past Surgical History: History reviewed. No pertinent surgical history. HPI:  Pt is a 40 yo female presenting with two weeks of headache and generalized weakness, with more acute onset of R hemiparesis and aphasia. 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 No known PMH.   Assessment / Plan / Recommendation Clinical Impression  Swallowing evaluation was completed with limited PO trials as RN reports that pt is not to have solids pending MRI of the abdomen/pelvis planned for later today. Pt speaks [place] but has not been acknowledging the video interpreter per RN, so pt's sister offered to assist. Even when using her primary language, pt had some difficulty following commands throughout the exam, including the oral motor exam. She had reduced awareness of the straw, bringing it toward her mouth but never fully putting it between her lips or attempting any sucking. Anterior loss of thin liquids is observed with cup sips, with concern for reduced labial seal and sensation. She does not have any overt signs of aspiration though. When medically able to resume POs, would continue with thin liquids. A solid recommendation is more challenging in light of overt signs of oral dysphagia without direct clinical observation. However, RN reported that pt was been doing well and managing pocketing, but needs a lot of time to do so. If wanting to resume a solid diet, would considering softening to at least Dys 2 (chopped) until additional SLP assessment can be  completed. Would provide careful assistance in monitoring and SLP will plan to f/u as able. SLP Visit Diagnosis: Dysphagia, unspecified (R13.10)    Aspiration Risk  Mild aspiration risk    Diet Recommendation  (up to Dys 2 diet/thin liquids per MD once able to resume PO diet)   Liquid Administration via: Cup Medication Administration: Crushed with puree Supervision: Staff to assist with self feeding;Full supervision/cueing for compensatory strategies Compensations: Minimize environmental distractions;Slow rate;Small sips/bites;Monitor for anterior loss;Other (Comment) (monitor for pocketing) Postural Changes: Seated upright at 90 degrees    Other  Recommendations Oral Care Recommendations: Oral care BID   Follow up Recommendations  (tba)      Frequency and Duration min 2x/week  2 weeks       Prognosis Prognosis for Safe Diet Advancement: Good Barriers to Reach Goals: Language deficits;Cognitive deficits      Swallow Study   General HPI: Pt is a 40 yo female presenting with two weeks of headache and generalized weakness, with more acute onset of R hemiparesis and aphasia. 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 No known PMH. Type of Study: Bedside Swallow Evaluation Previous Swallow Assessment: none in chart Diet Prior to this Study: Regular;Thin liquids Temperature Spikes Noted: No Respiratory Status: Room air History of Recent Intubation: No Behavior/Cognition: Alert;Cooperative;Confused;Requires cueing Oral Cavity Assessment: Within Functional Limits Oral Care Completed by SLP: No Oral Cavity - Dentition: Adequate natural dentition Vision: Functional for self-feeding Self-Feeding Abilities: Able to feed self;Needs assist Patient Positioning: Upright in bed Baseline Vocal Quality: Low vocal intensity Volitional Swallow: Unable  to elicit     Oral/Motor/Sensory Function Overall Oral Motor/Sensory Function:  (limited assessment given language barrier and confusion, but suspect R sided sensory > motor impairment)   Ice Chips Ice chips: Not tested   Thin Liquid Thin Liquid: Impaired Presentation: Cup;Self Fed;Straw Oral Phase Impairments: Reduced labial seal;Poor awareness of bolus Oral Phase Functional Implications: Right anterior spillage    Nectar Thick Nectar Thick Liquid: Not tested   Honey Thick Honey Thick Liquid: Not tested   Puree Puree: Not tested   Solid     Solid: Not tested        Osie Bond., M.A. [place] Pager [phone] Office [phone]  09/30/2020,12:34 PM

## 2020-10-01 ENCOUNTER — Other Ambulatory Visit (HOSPITAL_COMMUNITY): Payer: Self-pay

## 2020-10-01 ENCOUNTER — Inpatient Hospital Stay (HOSPITAL_COMMUNITY): Payer: Medicaid Other

## 2020-10-01 DIAGNOSIS — M25571 Pain in right ankle and joints of right foot: Secondary | ICD-10-CM | POA: Diagnosis not present

## 2020-10-01 DIAGNOSIS — R102 Pelvic and perineal pain: Secondary | ICD-10-CM

## 2020-10-01 DIAGNOSIS — M25579 Pain in unspecified ankle and joints of unspecified foot: Secondary | ICD-10-CM

## 2020-10-01 DIAGNOSIS — D259 Leiomyoma of uterus, unspecified: Secondary | ICD-10-CM

## 2020-10-01 DIAGNOSIS — G9389 Other specified disorders of brain: Secondary | ICD-10-CM | POA: Diagnosis not present

## 2020-10-01 DIAGNOSIS — R900 Intracranial space-occupying lesion found on diagnostic imaging of central nervous system: Secondary | ICD-10-CM | POA: Diagnosis not present

## 2020-10-01 DIAGNOSIS — Z21 Asymptomatic human immunodeficiency virus [HIV] infection status: Secondary | ICD-10-CM

## 2020-10-01 DIAGNOSIS — K611 Rectal abscess: Secondary | ICD-10-CM

## 2020-10-01 DIAGNOSIS — B20 Human immunodeficiency virus [HIV] disease: Secondary | ICD-10-CM

## 2020-10-01 DIAGNOSIS — R569 Unspecified convulsions: Secondary | ICD-10-CM | POA: Diagnosis not present

## 2020-10-01 DIAGNOSIS — N858 Other specified noninflammatory disorders of uterus: Secondary | ICD-10-CM

## 2020-10-01 LAB — CBC WITH DIFFERENTIAL/PLATELET
Abs Immature Granulocytes: 0.05 10*3/uL (ref 0.00–0.07)
Basophils Absolute: 0 10*3/uL (ref 0.0–0.1)
Basophils Relative: 0 %
Eosinophils Absolute: 0 10*3/uL (ref 0.0–0.5)
Eosinophils Relative: 0 %
HCT: 25.7 % — ABNORMAL LOW (ref 36.0–46.0)
Hemoglobin: 7.3 g/dL — ABNORMAL LOW (ref 12.0–15.0)
Immature Granulocytes: 1 %
Lymphocytes Relative: 24 %
Lymphs Abs: 1.2 10*3/uL (ref 0.7–4.0)
MCH: 17.8 pg — ABNORMAL LOW (ref 26.0–34.0)
MCHC: 28.4 g/dL — ABNORMAL LOW (ref 30.0–36.0)
MCV: 62.5 fL — ABNORMAL LOW (ref 80.0–100.0)
Monocytes Absolute: 0.6 10*3/uL (ref 0.1–1.0)
Monocytes Relative: 12 %
Neutro Abs: 3.1 10*3/uL (ref 1.7–7.7)
Neutrophils Relative %: 63 %
Platelets: 219 10*3/uL (ref 150–400)
RBC: 4.11 MIL/uL (ref 3.87–5.11)
RDW: 26 % — ABNORMAL HIGH (ref 11.5–15.5)
WBC: 4.9 10*3/uL (ref 4.0–10.5)
nRBC: 0 % (ref 0.0–0.2)

## 2020-10-01 LAB — CD4/CD8 (T-HELPER/T-SUPPRESSOR CELL)
CD4 absolute: <35 /uL — ABNORMAL LOW (ref 400–1790)
CD4%: 2 % — ABNORMAL LOW (ref 33–65)
CD8 T Cell Abs: 316 /uL (ref 190–1000)
CD8tox: 59 % — ABNORMAL HIGH (ref 12–40)
Ratio: 0.04 — ABNORMAL LOW (ref 1.0–3.0)
Total lymphocyte count: 535 /uL — ABNORMAL LOW (ref 1000–4000)

## 2020-10-01 LAB — HIV-1/2 AB - DIFFERENTIATION
HIV 1 Ab: REACTIVE — AB
HIV 2 Ab: NONREACTIVE

## 2020-10-01 LAB — BASIC METABOLIC PANEL
Anion gap: 5 (ref 5–15)
BUN: 11 mg/dL (ref 6–20)
CO2: 24 mmol/L (ref 22–32)
Calcium: 8.8 mg/dL — ABNORMAL LOW (ref 8.9–10.3)
Chloride: 108 mmol/L (ref 98–111)
Creatinine, Ser: 0.58 mg/dL (ref 0.44–1.00)
GFR, Estimated: >60 mL/min (ref >60)
Glucose, Bld: 99 mg/dL (ref 70–99)
Potassium: 3.3 mmol/L — ABNORMAL LOW (ref 3.5–5.1)
Sodium: 137 mmol/L (ref 135–145)

## 2020-10-01 LAB — TOXOPLASMA ANTIBODIES- IGG AND  IGM
Toxoplasma Antibody- IgM: <3 AU/mL (ref 0.0–7.9)
Toxoplasma IgG Ratio: 267 IU/mL — ABNORMAL HIGH (ref 0.0–7.1)

## 2020-10-01 LAB — PHOSPHORUS: Phosphorus: 3.6 mg/dL (ref 2.5–4.6)

## 2020-10-01 LAB — MISC LABCORP TEST (SEND OUT): Labcorp test code: 55285

## 2020-10-01 LAB — MAGNESIUM: Magnesium: 2.2 mg/dL (ref 1.7–2.4)

## 2020-10-01 LAB — HIV-1 RNA QUANT-NO REFLEX-BLD
HIV 1 RNA Quant: 28100 copies/mL
LOG10 HIV-1 RNA: 4.449 log10copy/mL

## 2020-10-01 MED ORDER — DEXAMETHASONE SODIUM PHOSPHATE 10 MG/ML IJ SOLN
6.0000 mg | Freq: Four times a day (QID) | INTRAMUSCULAR | Status: DC
  Administered 2020-10-01 – 2020-10-03 (×8): 6 mg via INTRAVENOUS
  Filled 2020-10-01: qty 1
  Filled 2020-10-01: qty 0.6
  Filled 2020-10-01 (×2): qty 1
  Filled 2020-10-01 (×5): qty 0.6

## 2020-10-01 MED ORDER — POTASSIUM CHLORIDE 10 MEQ/100ML IV SOLN
10.0000 meq | INTRAVENOUS | Status: AC
  Administered 2020-10-01 (×3): 10 meq via INTRAVENOUS
  Filled 2020-10-01 (×4): qty 100

## 2020-10-01 MED ORDER — DEXAMETHASONE 4 MG PO TABS: 4.0000 mg | ORAL_TABLET | Freq: Four times a day (QID) | ORAL | Status: DC

## 2020-10-01 MED ORDER — POTASSIUM CHLORIDE 10 MEQ/100ML IV SOLN
10.0000 meq | INTRAVENOUS | Status: AC
  Administered 2020-10-01 – 2020-10-02 (×5): 10 meq via INTRAVENOUS
  Filled 2020-10-01 (×4): qty 100

## 2020-10-01 NOTE — Progress Notes (Addendum)
Subjective:  No new complaints   Antibiotics:  Anti-infectives (From admission, onward)   None      Medications: Scheduled Meds: . dexamethasone (DECADRON) injection  6 mg Intravenous Q6H  . feeding supplement  237 mL Oral BID BM  . ferrous sulfate  325 mg Oral Q breakfast  . nystatin  5 mL Oral QID  . sodium chloride flush  3 mL Intravenous Once   Continuous Infusions: . levETIRAcetam 750 mg (10/01/20 1025)   PRN Meds:.acetaminophen **OR** acetaminophen, LORazepam    Objective: Weight change:   Intake/Output Summary (Last 24 hours) at 10/01/2020 1107 Last data filed at 09/30/2020 2100 Gross per 24 hour  Intake 333 ml  Output --  Net 333 ml   Blood pressure 116/79, pulse (!) 53, temperature 97.9 F (36.6 C), temperature source Oral, resp. rate 15, weight 89.8 kg, SpO2 100 %. Temp:  [97.6 F (36.4 C)-98.1 F (36.7 C)] 97.9 F (36.6 C) (04/20 0700) Pulse Rate:  [53-83] 53 (04/20 0400) Resp:  [15-23] 15 (04/20 0400) BP: (103-130)/(70-99) 116/79 (04/20 0700) SpO2:  [100 %] 100 % (04/20 0400)  Physical Exam: Physical Exam Constitutional:      Appearance: She is underweight.  HENT:     Head: Normocephalic and atraumatic.     Nose: Nose normal.  Eyes:     Extraocular Movements: Extraocular movements intact.  Cardiovascular:     Rate and Rhythm: Normal rate.  Pulmonary:     Effort: Pulmonary effort is normal. No respiratory distress.     Breath sounds: Rhonchi present. No wheezing.  Abdominal:     General: Bowel sounds are normal. There is distension.     Tenderness: There is abdominal tenderness.  Skin:    General: Skin is warm.     Findings: Rash present.  Psychiatric:        Attention and Perception: Attention normal.        Mood and Affect: Mood is depressed.        Speech: Speech is delayed.        Behavior: Behavior normal.        Thought Content: Thought content normal.        Cognition and Memory: Cognition and memory normal.       CBC:    BMET Recent Labs    09/30/20 0555 10/01/20 0744  NA 136 137  K 3.8 3.3*  CL 109 108  CO2 20* 24  GLUCOSE 104* 99  BUN 6 11  CREATININE 0.48 0.58  CALCIUM 8.5* 8.8*     Liver Panel  Recent Labs    09/29/20 1814 09/30/20 0555  PROT 9.7* 9.0*  ALBUMIN 3.6 3.2*  AST 41 33  ALT 27 24  ALKPHOS 66 60  BILITOT 0.6 0.7       Sedimentation Rate No results for input(s): ESRSEDRATE in the last 72 hours. C-Reactive Protein No results for input(s): CRP in the last 72 hours.  Micro Results: Recent Results (from the past 720 hour(s))  Resp Panel by RT-PCR (Flu A&B, Covid) Nasopharyngeal Swab     Status: None   Collection Time: 09/29/20 10:45 PM   Specimen: Nasopharyngeal Swab; Nasopharyngeal(NP) swabs in vial transport medium  Result Value Ref Range Status   SARS Coronavirus 2 by RT PCR NEGATIVE NEGATIVE Final    Comment: (NOTE) SARS-CoV-2 target nucleic acids are NOT DETECTED.  The SARS-CoV-2 RNA is generally detectable in upper respiratory specimens during the acute phase of infection.  The lowest concentration of SARS-CoV-2 viral copies this assay can detect is 138 copies/mL. A negative result does not preclude SARS-Cov-2 infection and should not be used as the sole basis for treatment or other patient management decisions. A negative result may occur with  improper specimen collection/handling, submission of specimen other than nasopharyngeal swab, presence of viral mutation(s) within the areas targeted by this assay, and inadequate number of viral copies(<138 copies/mL). A negative result must be combined with clinical observations, patient history, and epidemiological information. The expected result is Negative.  Fact Sheet for Patients:  EntrepreneurPulse.com.au  Fact Sheet for Healthcare Providers:  IncredibleEmployment.be  This test is no t yet approved or cleared by the [place] FDA and  has been  authorized for detection and/or diagnosis of SARS-CoV-2 by FDA under an Emergency Use Authorization (EUA). This EUA will remain  in effect (meaning this test can be used) for the duration of the COVID-19 declaration under Section 564(b)(1) of the Act, 21 U.S.C.section 360bbb-3(b)(1), unless the authorization is terminated  or revoked sooner.       Influenza A by PCR NEGATIVE NEGATIVE Final   Influenza B by PCR NEGATIVE NEGATIVE Final    Comment: (NOTE) The Xpert Xpress SARS-CoV-2/FLU/RSV plus assay is intended as an aid in the diagnosis of influenza from Nasopharyngeal swab specimens and should not be used as a sole basis for treatment. Nasal washings and aspirates are unacceptable for Xpert Xpress SARS-CoV-2/FLU/RSV testing.  Fact Sheet for Patients: EntrepreneurPulse.com.au  Fact Sheet for Healthcare Providers: IncredibleEmployment.be  This test is not yet approved or cleared by the [place] FDA and has been authorized for detection and/or diagnosis of SARS-CoV-2 by FDA under an Emergency Use Authorization (EUA). This EUA will remain in effect (meaning this test can be used) for the duration of the COVID-19 declaration under Section 564(b)(1) of the Act, 21 U.S.C. section 360bbb-3(b)(1), unless the authorization is terminated or revoked.  Performed at [facility], [place] [address], [place], [place] [address]   MRSA PCR Screening     Status: None   Collection Time: 09/30/20  1:07 AM   Specimen: Nasal Mucosa; Nasopharyngeal  Result Value Ref Range Status   MRSA by PCR NEGATIVE NEGATIVE Final    Comment:        The GeneXpert MRSA Assay (FDA approved for NASAL specimens only), is one component of a comprehensive MRSA colonization surveillance program. It is not intended to diagnose MRSA infection nor to guide or monitor treatment for MRSA infections. Performed at [facility], [place] [address] [place], [place]  [address]     Studies/Results: CT ANGIO HEAD W OR WO CONTRAST  Result Date: 09/29/2020 CLINICAL DATA:  Acute neuro deficit, aphasia.  Right-sided deficit EXAM: CT ANGIOGRAPHY HEAD AND NECK CT PERFUSION BRAIN TECHNIQUE: Multidetector CT imaging of the head and neck was performed using the standard protocol during bolus administration of intravenous contrast. Multiplanar CT image reconstructions and MIPs were obtained to evaluate the vascular anatomy. Carotid stenosis measurements (when applicable) are obtained utilizing NASCET criteria, using the distal internal carotid diameter as the denominator. Multiphase CT imaging of the brain was performed following IV bolus contrast injection. Subsequent parametric perfusion maps were calculated using RAPID software. CONTRAST:  138mL OMNIPAQUE IOHEXOL 350 MG/ML SOLN COMPARISON:  CT head 09/29/2020 FINDINGS: CTA NECK FINDINGS Aortic arch: Standard branching. Imaged portion shows no evidence of aneurysm or dissection. No significant stenosis of the major arch vessel origins. Right carotid system: Normal right carotid.  Negative for atherosclerotic disease. Left carotid system: Negative left carotid. Negative for atherosclerotic disease or stenosis. Vertebral arteries: Normal vertebral arteries bilaterally. Skeleton: No acute skeletal abnormality. Other neck: Negative for mass or enlarged lymph node. Upper chest: Mild ground-glass density in the right upper lobe without focal infiltrate or mass. Review of the MIP images confirms the above findings CTA HEAD FINDINGS Anterior circulation: Normal anterior circulation. Negative for stenosis or large vessel occlusion. Posterior circulation: Normal posterior circulation. Negative for stenosis or large vessel occlusion. Venous sinuses: Normal venous enhancement. Anatomic variants: None Review of the MIP images confirms the above findings CT Brain Perfusion Findings: ASPECTS: 10 CBF (<30%) Volume: 36mL Perfusion (Tmax>6.0s) volume:  62mL Mismatch Volume: 64mL Infarction Location:Patchy decreased perfusion in the left frontal lobe and high right frontal lobe. IMPRESSION: Normal CT angiogram head neck. No significant atherosclerotic disease. No intracranial large vessel occlusion CT perfusion demonstrates small patchy areas of diminished perfusion left frontal and right frontal lobe of uncertain significance. No fixed infarct. Electronically Signed   By: Franchot Gallo M.D.   On: 09/29/2020 18:55   DG Ankle 2 Views Right  Result Date: 09/30/2020 CLINICAL DATA:  Ankle pain EXAM: RIGHT ANKLE - 2 VIEW COMPARISON:  X-ray ankle 09/17/2019 FINDINGS: No evidence of fracture, dislocation, or joint effusion. Redemonstration of a sclerotic appearance of the navicular bone. Soft tissues are unremarkable. IMPRESSION: No acute displaced fracture or dislocation with redemonstration of a chronic sclerotic appearing navicular. As per previous x-ray, noncontrast CT or MRI of the right focal may be valuable for further evaluation if clinically indicated. Electronically Signed   By: Iven Finn M.D.   On: 09/30/2020 21:01   CT ANGIO NECK W OR WO CONTRAST  Result Date: 09/29/2020 CLINICAL DATA:  Acute neuro deficit, aphasia.  Right-sided deficit EXAM: CT ANGIOGRAPHY HEAD AND NECK CT PERFUSION BRAIN TECHNIQUE: Multidetector CT imaging of the head and neck was performed using the standard protocol during bolus administration of intravenous contrast. Multiplanar CT image reconstructions and MIPs were obtained to evaluate the vascular anatomy. Carotid stenosis measurements (when applicable) are obtained utilizing NASCET criteria, using the distal internal carotid diameter as the denominator. Multiphase CT imaging of the brain was performed following IV bolus contrast injection. Subsequent parametric perfusion maps were calculated using RAPID software. CONTRAST:  171mL OMNIPAQUE IOHEXOL 350 MG/ML SOLN COMPARISON:  CT head 09/29/2020 FINDINGS: CTA NECK  FINDINGS Aortic arch: Standard branching. Imaged portion shows no evidence of aneurysm or dissection. No significant stenosis of the major arch vessel origins. Right carotid system: Normal right carotid. Negative for atherosclerotic disease. Left carotid system: Negative left carotid. Negative for atherosclerotic disease or stenosis. Vertebral arteries: Normal vertebral arteries bilaterally. Skeleton: No acute skeletal abnormality. Other neck: Negative for mass or enlarged lymph node. Upper chest: Mild ground-glass density in the right upper lobe without focal infiltrate or mass. Review of the MIP images confirms the above findings CTA HEAD FINDINGS Anterior circulation: Normal anterior circulation. Negative for stenosis or large vessel occlusion. Posterior circulation: Normal posterior circulation. Negative for stenosis or large vessel occlusion. Venous sinuses: Normal venous enhancement. Anatomic variants: None Review of the MIP images confirms the above findings CT Brain Perfusion Findings: ASPECTS: 10 CBF (<30%) Volume: 28mL Perfusion (Tmax>6.0s) volume: 71mL Mismatch Volume: 1mL Infarction Location:Patchy decreased perfusion in the left frontal lobe and high right frontal lobe. IMPRESSION: Normal CT angiogram head neck. No significant atherosclerotic disease. No intracranial large vessel occlusion CT perfusion demonstrates small patchy areas of diminished perfusion left frontal and  right frontal lobe of uncertain significance. No fixed infarct. Electronically Signed   By: Franchot Gallo M.D.   On: 09/29/2020 18:55   MR Brain W and Wo Contrast  Result Date: 09/30/2020 CLINICAL DATA:  Right-sided facial droop with aphasia. Left-sided body weakness EXAM: MRI HEAD WITHOUT AND WITH CONTRAST TECHNIQUE: Multiplanar, multiecho pulse sequences of the brain and surrounding structures were obtained without and with intravenous contrast. CONTRAST:  34mL GADAVIST GADOBUTROL 1 MMOL/ML IV SOLN COMPARISON:  CT and CTA from  yesterday FINDINGS: Brain: 2 ring-enhancing comma shaped brain lesions located along the gray-white matter junction of the posterior left temporal lobe and lateral left frontal lobe, measuring up to 21 mm in maximal span at the posterior temporal lobe. No associated restricted diffusion to imply pyogenic abscess. No discontinuous enhancement to imply tumefactive demyelination. Both lesions have a very similar morphology with a fairly smoothly contoured rim and adjacent nodular thickening with infolding of the wall. There is extensive adjacent vasogenic edema with local mass effect and mild rightward midline shift. No acute infarct, hydrocephalus, or acute hemorrhage. Small presumed arachnoid cyst is seen at the right vertex with mild local mass effect. Vascular: Preserved flow voids and vascular enhancement Skull and upper cervical spine: Normal marrow signal. Sinuses/Orbits: Opacified right frontal sinus with proteinaceous features. 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 Electronically Signed   By: Monte Fantasia M.D.   On: 09/30/2020 05:32   CT CHEST ABDOMEN PELVIS W CONTRAST  Result Date: 09/29/2020 CLINICAL DATA:  Intracranial mass, rule out primary lesion EXAM: CT CHEST, ABDOMEN, AND PELVIS WITH CONTRAST TECHNIQUE: Multidetector CT imaging of the chest, abdomen and pelvis was performed following the standard protocol during bolus administration of intravenous contrast. CONTRAST:  19mL OMNIPAQUE IOHEXOL 300 MG/ML  SOLN COMPARISON:  None. FINDINGS: CT CHEST FINDINGS Cardiovascular: Thoracic aorta and its branches are within normal limits. No aneurysmal dilatation is seen. The pulmonary artery as visualized is within normal limits. Heart  is not significantly enlarged. Mediastinum/Nodes: Thoracic inlet is within normal limits. No hilar or mediastinal adenopathy is noted. The esophagus as visualized is within normal limits. Lungs/Pleura: The lungs are well aerated bilaterally. Mild dependent atelectatic changes are seen. No focal parenchymal nodule is noted. No sizable effusion or pneumothorax is noted. Musculoskeletal: Degenerative changes of the thoracic spine are noted. No rib abnormality is seen. No specific breast mass is noted. CT ABDOMEN PELVIS FINDINGS Hepatobiliary: No focal liver abnormality is seen. No gallstones, gallbladder wall thickening, or biliary dilatation. Pancreas: Unremarkable. No pancreatic ductal dilatation or surrounding inflammatory changes. Spleen: Normal in size without focal abnormality. Adrenals/Urinary Tract: Adrenal glands are within normal limits. Kidneys demonstrate a normal enhancement pattern with normal excretion bilaterally. No obstructive changes are seen. Bladder is well distended with opacified urine consistent with prior contrast administration. Stomach/Bowel: No obstructive or inflammatory changes of the colon are noted. The appendix is well visualized and within normal limits. Small bowel and stomach appear within normal limits. Vascular/Lymphatic: No significant vascular findings are present. No enlarged abdominal or pelvic lymph nodes. Reproductive: Uterus is well visualized and demonstrates bulky enlargement with multifocal lesions. The largest of these measures at least 10 cm in dimension. These likely represent uterine fibroids although the possibility of a more aggressive lesion could not be totally excluded. Other:  Adjacent to the rectum on the right there is a somewhat multiloculated cystic structure which measures approximately 4.6 by 3.0 cm. This is of uncertain etiology. Possibility of small perirectal abscess would deserve consideration. Alternatively this could represent a cystic metastatic  lesion. Musculoskeletal: No acute bony abnormality is noted. IMPRESSION: CT of the chest: No acute intrathoracic abnormality is noted. CT of the abdomen and pelvis: Significantly enlarged uterus with multiple uterine lesions within. These may simply represent uterine fibroids although the possibility of a more aggressive process could not be excluded given the changes in the head on recent CT. MRI would be helpful on a nonemergent basis when the patient's condition has improved for further evaluation. Cystic structure adjacent to the rectum on the right of uncertain significance and etiology. This could also be evaluated with MRI of the pelvis. No other focal abnormality is noted. Electronically Signed   By: Inez Catalina M.D.   On: 09/29/2020 20:45   CT CEREBRAL PERFUSION W CONTRAST  Result Date: 09/29/2020 CLINICAL DATA:  Acute neuro deficit, aphasia.  Right-sided deficit EXAM: CT ANGIOGRAPHY HEAD AND NECK CT PERFUSION BRAIN TECHNIQUE: Multidetector CT imaging of the head and neck was performed using the standard protocol during bolus administration of intravenous contrast. Multiplanar CT image reconstructions and MIPs were obtained to evaluate the vascular anatomy. Carotid stenosis measurements (when applicable) are obtained utilizing NASCET criteria, using the distal internal carotid diameter as the denominator. Multiphase CT imaging of the brain was performed following IV bolus contrast injection. Subsequent parametric perfusion maps were calculated using RAPID software. CONTRAST:  130mL OMNIPAQUE IOHEXOL 350 MG/ML SOLN COMPARISON:  CT head 09/29/2020 FINDINGS: CTA NECK FINDINGS Aortic arch: Standard branching. Imaged portion shows no evidence of aneurysm or dissection. No significant stenosis of the major arch vessel origins. Right carotid system: Normal right carotid. Negative for atherosclerotic disease. Left carotid system: Negative left carotid. Negative for atherosclerotic disease or stenosis.  Vertebral arteries: Normal vertebral arteries bilaterally. Skeleton: No acute skeletal abnormality. Other neck: Negative for mass or enlarged lymph node. Upper chest: Mild ground-glass density in the right upper lobe without focal infiltrate or mass. Review of the MIP images confirms the above findings CTA HEAD FINDINGS Anterior circulation: Normal anterior circulation. Negative for stenosis or large vessel occlusion. Posterior circulation: Normal posterior circulation. Negative for stenosis or large vessel occlusion. Venous sinuses: Normal venous enhancement. Anatomic variants: None Review of the MIP images confirms the above findings CT Brain Perfusion Findings: ASPECTS: 10 CBF (<30%) Volume: 61mL Perfusion (Tmax>6.0s) volume: 51mL Mismatch Volume: 53mL Infarction Location:Patchy decreased perfusion in the left frontal lobe and high right frontal lobe. IMPRESSION: Normal CT angiogram head neck. No significant atherosclerotic disease. No intracranial large vessel occlusion CT perfusion demonstrates small patchy areas of diminished perfusion left frontal and right frontal lobe of uncertain significance. No fixed infarct. Electronically Signed   By: Franchot Gallo M.D.   On: 09/29/2020 18:55   EEG adult  Result Date: 09/30/2020 Lora Havens, MD     09/30/2020  2:07 PM Patient Name: Candace Jones MRN: [id number] Epilepsy Attending: Lora Havens Referring Physician/Provider: Dr. Gean Birchwood Date: 09/30/2020 Duration: 23.02 mins Patient history: 40 year old female presented with sudden onset of aphasia right facial droop and right-sided weakness.  CT head showed possible left-sided brain mass with edema.  EEG to evaluate for seizures. Level of alertness: Awake AEDs during EEG study: Keppra Technical aspects: This EEG study was done with scalp electrodes positioned according to the 10-20 International system of  electrode placement. Electrical activity was acquired at a sampling rate of 500Hz  and  reviewed with a high frequency filter of 70Hz  and a low frequency filter of 1Hz . EEG data were recorded continuously and digitally stored. Description:  The posterior dominant rhythm consists of 9 Hz activity of moderate voltage (25-35 uV) seen predominantly in posterior head regions, symmetric and reactive to eye opening and eye closing. EEG also showed continuous rhythmic amplitude 2 to 3 Hz delta slowing in left temporal region. Additionally there is also continuous generalized polymorphic mixed frequencies with predominantly 5 to 8 Hz theta-alpha activity. Hyperventilation and photic stimulation were not performed.   ABNORMALITY - Lateralized rhythmic delta activity, left temporal region - Continuous slow, generalized IMPRESSION: This study is suggestive of cortical dysfunction in left temporal region likely secondary underlying structural abnormality, ictal, postictal state.  Additionally there is evidence of moderate diffuse encephalopathy, nonspecific etiology.  No seizures or epileptiform discharges were seen throughout the recording. Priyanka Barbra Sarks    Overnight EEG with video  Result Date: 10/01/2020 Lora Havens, MD     10/01/2020 10:04 AM Patient Name: Candace Jones MRN: [id number] Epilepsy Attending: Lora Havens Referring Physician/Provider:  Myra Rude, NP Duration: 09/30/2020 1406 to 10/01/2020 1000  Patient history: 40 year old female presented with sudden onset of aphasia right facial droop and right-sided weakness.  CT head showed possible left-sided brain mass with edema.  EEG to evaluate for seizures.  Level of alertness: Awake, asleep  AEDs during EEG study: Keppra  Technical aspects: This EEG study was done with scalp electrodes positioned according to the 10-20 International system of electrode placement. Electrical activity was acquired at a sampling rate of 500Hz  and reviewed with a high frequency filter of 70Hz  and a low frequency filter of 1Hz . EEG data were recorded  continuously and digitally stored.  Description:  The posterior dominant rhythm consists of 9 Hz activity of moderate voltage (25-35 uV) seen predominantly in posterior head regions, symmetric and reactive to eye opening and eye closing.   Sleep was characterized by vertex waves, sleep spindles (12 to 14 Hz), maximal frontocentral region.  EEG also showed near continuous rhythmic amplitude 2 to 3 Hz delta slowing in left temporal region. Additionally there is also continuous generalized polymorphic mixed frequencies with predominantly 5 to 8 Hz theta-alpha activity. Hyperventilation and photic stimulation were not performed.    ABNORMALITY - Lateralized rhythmic delta activity, left temporal region - Continuous slow, generalized  IMPRESSION: This study is suggestive of cortical dysfunction in left temporal region likely secondary underlying structural abnormality, ictal, postictal state.  Additionally there is evidence of moderate diffuse encephalopathy, nonspecific etiology.  No seizures or epileptiform discharges were seen throughout the recording.  Lora Havens   CT HEAD CODE STROKE WO CONTRAST  Addendum Date: 09/29/2020   ADDENDUM REPORT: 09/29/2020 18:36 ADDENDUM: These results were called by telephone at the time of interpretation on 09/29/2020 at 6:36 pm to provider [facility] , who verbally acknowledged these results. Code stroke imaging results were communicated on 09/29/2020 at 6:36 pm to provider Bhagat via text page Electronically Signed   By: Franchot Gallo M.D.   On: 09/29/2020 18:36   Result Date: 09/29/2020 CLINICAL DATA:  Code stroke. Acute neuro deficit. Right-sided deficit, aphasia EXAM: CT HEAD WITHOUT CONTRAST TECHNIQUE: Contiguous axial images were obtained from the base of the skull through the vertex without intravenous contrast. COMPARISON:  None. FINDINGS: Brain: Moderate amount of white matter edema is present in the left  temporal lobe and left posterior frontal lobe with  sparing of the cortex. There is mass-effect and 6 mm midline shift to the right. No edema on the right side. No hemorrhage. Ventricle size normal. Vascular: Negative for hyperdense vessel Skull: Negative Sinuses/Orbits: Mild mucosal edema paranasal sinuses. Negative orbit Other: None ASPECTS (Rison Stroke Program Early CT Score) - Ganglionic level infarction (caudate, lentiform nuclei, internal capsule, insula, M1-M3 cortex): 7 - Supraganglionic infarction (M4-M6 cortex): 3 Total score (0-10 with 10 being normal): 10 IMPRESSION: 1. Moderate amount of white matter edema in the left frontal and temporal lobe with sparing of cortex. Pattern is most likely due to tumor. Possible metastatic disease or primary brain tumor. Recommend MRI brain without and with contrast 2. ASPECTS is 10 Electronically Signed: By: Franchot Gallo M.D. On: 09/29/2020 18:33      Assessment/Plan:  INTERVAL HISTORY:   Dr Arnoldo Morale came by to see the patient while we were doing her and sister using the iPad translating device for [place].  I discussed the case with Dr. Arnoldo Morale and would favor a biopsy of one of the lesions to help guide therapy.  The patient is also in agreement and I think this will be quite critical to timely diagnosis and treatment   Principal Problem:   Intracranial mass Active Problems:   Seizure (HCC)   Microcytic hypochromic anemia   Brain mass    Candace Jones is a 40 y.o. female admitted with right-sided weakness and aphasia found to have 2 lesions along the white-gray matter in the posterior left temporal left temporal lobe and frontal lobe.  She is also had abdominal pain and distention and was found on CT scan to have enlarged uterus with multiple uterine masses and a complex fluid collection near the rectum.  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  #1 Intracranial masses:  Differential includes malignancy and from ID standpoint clearly Toxoplasmosis, but also Cryptococcus, Histoplasmosis, TB  (does not seem typical for Neurocystercosis)  If biopsy is done  Please send  One sample for pathology so they can look for malignancy and also apply stains for Toxo, Fungi, esp crypto and TB and  A SECOND sample in sterile saline that can be used to obtain fungal cultures and AFB cultures  We cam also try to send tissue from path (or from micro for PCR's) but I expect the pathology itself will be definitive  #2  Uterine masses and cystic area near the rectum:  Weight MRI imaging to further delineate what is going on here.  Suspect she will also need biopsies taken from these sites.  Certainly she has an abscess it should be drained and sent for culture   #3  Rash: Syphilis titers pending.  Differential is also broad and could be due to Kaposi's sarcoma dimorphic fungi less likely Bartonella and also HIV infection itself.  Work-up of the rash and biopsy are of lower concern to me although the patient seems preoccupied with this it really is the least of her problems at this point in time   #4 thrush using nystatin swish and swallow and avoid systemic antifungal agents until we have delineated what is going on in her brain and her abdomen  #5 HIV likely AIDS confirmatory testing still pending.  Will likely need  to delay treatment given her in high intracranial pressures and certainly should she have cryptococcal disease she would need delay by minimum of 5 weeks prior to initiating antiretroviral therapy  I spent greater than 35 minutes with the patient including greater than 50% of time in face to face counsel of the patient and her sister using the [place] iPad remote translation system and in coordination of her care with Dr. Arnoldo Morale and Dr. Curly Shores.       LOS: 1 day   Alcide Evener 10/01/2020, 11:07 AM

## 2020-10-01 NOTE — Progress Notes (Signed)
Paged on call, Rolanda Lundborg, about pt HR sustaining 39-45, which is different from today.

## 2020-10-01 NOTE — Progress Notes (Signed)
Subjective: The patient is much more alert and conversant.  Her sister is at the bedside.  We have a video translator presently.  Objective: Vital signs in last 24 hours: Temp:  [97.6 F (36.4 C)-98.1 F (36.7 C)] 97.9 F (36.6 C) (04/20 0700) Pulse Rate:  [53-83] 53 (04/20 0400) Resp:  [15-23] 15 (04/20 0400) BP: (103-130)/(70-99) 116/79 (04/20 0700) SpO2:  [100 %] 100 % (04/20 0400) Estimated body mass index is 30 kg/m as calculated from the following:   Height as of 04/04/19: 5' 8.11" (1.73 m).   Weight as of this encounter: 89.8 kg.   Intake/Output from previous day: 04/19 0701 - 04/20 0700 In: 333 [IV Piggyback:333] Out: -  Intake/Output this shift: No intake/output data recorded.  Physical exam the patient is alert and oriented x3.  The best I can sell her speech is fairly normal although she speaks in [place].  She is moving all 4 extremities well.  Lab Results: Recent Labs    09/30/20 0555 10/01/20 0744  WBC 3.1* 4.9  HGB 7.1* 7.3*  HCT 25.5* 25.7*  PLT 183 219   BMET Recent Labs    09/30/20 0555 10/01/20 0744  NA 136 137  K 3.8 3.3*  CL 109 108  CO2 20* 24  GLUCOSE 104* 99  BUN 6 11  CREATININE 0.48 0.58  CALCIUM 8.5* 8.8*    Studies/Results: CT ANGIO HEAD W OR WO CONTRAST  Result Date: 09/29/2020 CLINICAL DATA:  Acute neuro deficit, aphasia.  Right-sided deficit EXAM: CT ANGIOGRAPHY HEAD AND NECK CT PERFUSION BRAIN TECHNIQUE: Multidetector CT imaging of the head and neck was performed using the standard protocol during bolus administration of intravenous contrast. Multiplanar CT image reconstructions and MIPs were obtained to evaluate the vascular anatomy. Carotid stenosis measurements (when applicable) are obtained utilizing NASCET criteria, using the distal internal carotid diameter as the denominator. Multiphase CT imaging of the brain was performed following IV bolus contrast injection. Subsequent parametric perfusion maps were calculated using  RAPID software. CONTRAST:  127mL OMNIPAQUE IOHEXOL 350 MG/ML SOLN COMPARISON:  CT head 09/29/2020 FINDINGS: CTA NECK FINDINGS Aortic arch: Standard branching. Imaged portion shows no evidence of aneurysm or dissection. No significant stenosis of the major arch vessel origins. Right carotid system: Normal right carotid. Negative for atherosclerotic disease. Left carotid system: Negative left carotid. Negative for atherosclerotic disease or stenosis. Vertebral arteries: Normal vertebral arteries bilaterally. Skeleton: No acute skeletal abnormality. Other neck: Negative for mass or enlarged lymph node. Upper chest: Mild ground-glass density in the right upper lobe without focal infiltrate or mass. Review of the MIP images confirms the above findings CTA HEAD FINDINGS Anterior circulation: Normal anterior circulation. Negative for stenosis or large vessel occlusion. Posterior circulation: Normal posterior circulation. Negative for stenosis or large vessel occlusion. Venous sinuses: Normal venous enhancement. Anatomic variants: None Review of the MIP images confirms the above findings CT Brain Perfusion Findings: ASPECTS: 10 CBF (<30%) Volume: 25mL Perfusion (Tmax>6.0s) volume: 62mL Mismatch Volume: 28mL Infarction Location:Patchy decreased perfusion in the left frontal lobe and high right frontal lobe. IMPRESSION: Normal CT angiogram head neck. No significant atherosclerotic disease. No intracranial large vessel occlusion CT perfusion demonstrates small patchy areas of diminished perfusion left frontal and right frontal lobe of uncertain significance. No fixed infarct. Electronically Signed   By: Franchot Gallo M.D.   On: 09/29/2020 18:55   DG Ankle 2 Views Right  Result Date: 09/30/2020 CLINICAL DATA:  Ankle pain EXAM: RIGHT ANKLE - 2 VIEW COMPARISON:  X-ray ankle  09/17/2019 FINDINGS: No evidence of fracture, dislocation, or joint effusion. Redemonstration of a sclerotic appearance of the navicular bone. Soft  tissues are unremarkable. IMPRESSION: No acute displaced fracture or dislocation with redemonstration of a chronic sclerotic appearing navicular. As per previous x-ray, noncontrast CT or MRI of the right focal may be valuable for further evaluation if clinically indicated. Electronically Signed   By: Iven Finn M.D.   On: 09/30/2020 21:01   CT ANGIO NECK W OR WO CONTRAST  Result Date: 09/29/2020 CLINICAL DATA:  Acute neuro deficit, aphasia.  Right-sided deficit EXAM: CT ANGIOGRAPHY HEAD AND NECK CT PERFUSION BRAIN TECHNIQUE: Multidetector CT imaging of the head and neck was performed using the standard protocol during bolus administration of intravenous contrast. Multiplanar CT image reconstructions and MIPs were obtained to evaluate the vascular anatomy. Carotid stenosis measurements (when applicable) are obtained utilizing NASCET criteria, using the distal internal carotid diameter as the denominator. Multiphase CT imaging of the brain was performed following IV bolus contrast injection. Subsequent parametric perfusion maps were calculated using RAPID software. CONTRAST:  130mL OMNIPAQUE IOHEXOL 350 MG/ML SOLN COMPARISON:  CT head 09/29/2020 FINDINGS: CTA NECK FINDINGS Aortic arch: Standard branching. Imaged portion shows no evidence of aneurysm or dissection. No significant stenosis of the major arch vessel origins. Right carotid system: Normal right carotid. Negative for atherosclerotic disease. Left carotid system: Negative left carotid. Negative for atherosclerotic disease or stenosis. Vertebral arteries: Normal vertebral arteries bilaterally. Skeleton: No acute skeletal abnormality. Other neck: Negative for mass or enlarged lymph node. Upper chest: Mild ground-glass density in the right upper lobe without focal infiltrate or mass. Review of the MIP images confirms the above findings CTA HEAD FINDINGS Anterior circulation: Normal anterior circulation. Negative for stenosis or large vessel occlusion.  Posterior circulation: Normal posterior circulation. Negative for stenosis or large vessel occlusion. Venous sinuses: Normal venous enhancement. Anatomic variants: None Review of the MIP images confirms the above findings CT Brain Perfusion Findings: ASPECTS: 10 CBF (<30%) Volume: 24mL Perfusion (Tmax>6.0s) volume: 54mL Mismatch Volume: 11mL Infarction Location:Patchy decreased perfusion in the left frontal lobe and high right frontal lobe. IMPRESSION: Normal CT angiogram head neck. No significant atherosclerotic disease. No intracranial large vessel occlusion CT perfusion demonstrates small patchy areas of diminished perfusion left frontal and right frontal lobe of uncertain significance. No fixed infarct. Electronically Signed   By: Franchot Gallo M.D.   On: 09/29/2020 18:55   MR Brain W and Wo Contrast  Result Date: 09/30/2020 CLINICAL DATA:  Right-sided facial droop with aphasia. Left-sided body weakness EXAM: MRI HEAD WITHOUT AND WITH CONTRAST TECHNIQUE: Multiplanar, multiecho pulse sequences of the brain and surrounding structures were obtained without and with intravenous contrast. CONTRAST:  42mL GADAVIST GADOBUTROL 1 MMOL/ML IV SOLN COMPARISON:  CT and CTA from yesterday FINDINGS: Brain: 2 ring-enhancing comma shaped brain lesions located along the gray-white matter junction of the posterior left temporal lobe and lateral left frontal lobe, measuring up to 21 mm in maximal span at the posterior temporal lobe. No associated restricted diffusion to imply pyogenic abscess. No discontinuous enhancement to imply tumefactive demyelination. Both lesions have a very similar morphology with a fairly smoothly contoured rim and adjacent nodular thickening with infolding of the wall. There is extensive adjacent vasogenic edema with local mass effect and mild rightward midline shift. No acute infarct, hydrocephalus, or acute hemorrhage. Small presumed arachnoid cyst is seen at the right vertex with mild local mass  effect. Vascular: Preserved flow voids and vascular enhancement Skull and upper cervical  spine: Normal marrow signal. Sinuses/Orbits: Opacified right frontal sinus with proteinaceous features. 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 Electronically Signed   By: Monte Fantasia M.D.   On: 09/30/2020 05:32   CT CHEST ABDOMEN PELVIS W CONTRAST  Result Date: 09/29/2020 CLINICAL DATA:  Intracranial mass, rule out primary lesion EXAM: CT CHEST, ABDOMEN, AND PELVIS WITH CONTRAST TECHNIQUE: Multidetector CT imaging of the chest, abdomen and pelvis was performed following the standard protocol during bolus administration of intravenous contrast. CONTRAST:  44mL OMNIPAQUE IOHEXOL 300 MG/ML  SOLN COMPARISON:  None. FINDINGS: CT CHEST FINDINGS Cardiovascular: Thoracic aorta and its branches are within normal limits. No aneurysmal dilatation is seen. The pulmonary artery as visualized is within normal limits. Heart is not significantly enlarged. Mediastinum/Nodes: Thoracic inlet is within normal limits. No hilar or mediastinal adenopathy is noted. The esophagus as visualized is within normal limits. Lungs/Pleura: The lungs are well aerated bilaterally. Mild dependent atelectatic changes are seen. No focal parenchymal nodule is noted. No sizable effusion or pneumothorax is noted. Musculoskeletal: Degenerative changes of the thoracic spine are noted. No rib abnormality is seen. No specific breast mass is noted. CT ABDOMEN PELVIS FINDINGS Hepatobiliary: No focal liver abnormality is seen. No gallstones, gallbladder wall thickening, or biliary dilatation. Pancreas: Unremarkable. No pancreatic ductal dilatation or surrounding inflammatory changes. Spleen:  Normal in size without focal abnormality. Adrenals/Urinary Tract: Adrenal glands are within normal limits. Kidneys demonstrate a normal enhancement pattern with normal excretion bilaterally. No obstructive changes are seen. Bladder is well distended with opacified urine consistent with prior contrast administration. Stomach/Bowel: No obstructive or inflammatory changes of the colon are noted. The appendix is well visualized and within normal limits. Small bowel and stomach appear within normal limits. Vascular/Lymphatic: No significant vascular findings are present. No enlarged abdominal or pelvic lymph nodes. Reproductive: Uterus is well visualized and demonstrates bulky enlargement with multifocal lesions. The largest of these measures at least 10 cm in dimension. These likely represent uterine fibroids although the possibility of a more aggressive lesion could not be totally excluded. Other: Adjacent to the rectum on the right there is a somewhat multiloculated cystic structure which measures approximately 4.6 by 3.0 cm. This is of uncertain etiology. Possibility of small perirectal abscess would deserve consideration. Alternatively this could represent a cystic metastatic lesion. Musculoskeletal: No acute bony abnormality is noted. IMPRESSION: CT of the chest: No acute intrathoracic abnormality is noted. CT of the abdomen and pelvis: Significantly enlarged uterus with multiple uterine lesions within. These may simply represent uterine fibroids although the possibility of a more aggressive process could not be excluded given the changes in the head on recent CT. MRI would be helpful on a nonemergent basis when the patient's condition has improved for further evaluation. Cystic structure adjacent to the rectum on the right of uncertain significance and etiology. This could also be evaluated with MRI of the pelvis. No other focal abnormality is noted. Electronically Signed   By: Inez Catalina M.D.   On: 09/29/2020  20:45   CT CEREBRAL PERFUSION W CONTRAST  Result Date: 09/29/2020 CLINICAL DATA:  Acute neuro deficit, aphasia.  Right-sided deficit EXAM: CT ANGIOGRAPHY HEAD AND NECK CT PERFUSION BRAIN TECHNIQUE: Multidetector CT imaging of the head and neck was performed using the standard protocol during bolus administration  of intravenous contrast. Multiplanar CT image reconstructions and MIPs were obtained to evaluate the vascular anatomy. Carotid stenosis measurements (when applicable) are obtained utilizing NASCET criteria, using the distal internal carotid diameter as the denominator. Multiphase CT imaging of the brain was performed following IV bolus contrast injection. Subsequent parametric perfusion maps were calculated using RAPID software. CONTRAST:  152mL OMNIPAQUE IOHEXOL 350 MG/ML SOLN COMPARISON:  CT head 09/29/2020 FINDINGS: CTA NECK FINDINGS Aortic arch: Standard branching. Imaged portion shows no evidence of aneurysm or dissection. No significant stenosis of the major arch vessel origins. Right carotid system: Normal right carotid. Negative for atherosclerotic disease. Left carotid system: Negative left carotid. Negative for atherosclerotic disease or stenosis. Vertebral arteries: Normal vertebral arteries bilaterally. Skeleton: No acute skeletal abnormality. Other neck: Negative for mass or enlarged lymph node. Upper chest: Mild ground-glass density in the right upper lobe without focal infiltrate or mass. Review of the MIP images confirms the above findings CTA HEAD FINDINGS Anterior circulation: Normal anterior circulation. Negative for stenosis or large vessel occlusion. Posterior circulation: Normal posterior circulation. Negative for stenosis or large vessel occlusion. Venous sinuses: Normal venous enhancement. Anatomic variants: None Review of the MIP images confirms the above findings CT Brain Perfusion Findings: ASPECTS: 10 CBF (<30%) Volume: 28mL Perfusion (Tmax>6.0s) volume: 57mL Mismatch Volume:  71mL Infarction Location:Patchy decreased perfusion in the left frontal lobe and high right frontal lobe. IMPRESSION: Normal CT angiogram head neck. No significant atherosclerotic disease. No intracranial large vessel occlusion CT perfusion demonstrates small patchy areas of diminished perfusion left frontal and right frontal lobe of uncertain significance. No fixed infarct. Electronically Signed   By: Franchot Gallo M.D.   On: 09/29/2020 18:55   EEG adult  Result Date: 09/30/2020 Lora Havens, MD     09/30/2020  2:07 PM Patient Name: Laverna Dossett MRN: [id number] Epilepsy Attending: Lora Havens Referring Physician/Provider: Dr. Gean Birchwood Date: 09/30/2020 Duration: 23.02 mins Patient history: 40 year old female presented with sudden onset of aphasia right facial droop and right-sided weakness.  CT head showed possible left-sided brain mass with edema.  EEG to evaluate for seizures. Level of alertness: Awake AEDs during EEG study: Keppra Technical aspects: This EEG study was done with scalp electrodes positioned according to the 10-20 International system of electrode placement. Electrical activity was acquired at a sampling rate of 500Hz  and reviewed with a high frequency filter of 70Hz  and a low frequency filter of 1Hz . EEG data were recorded continuously and digitally stored. Description:  The posterior dominant rhythm consists of 9 Hz activity of moderate voltage (25-35 uV) seen predominantly in posterior head regions, symmetric and reactive to eye opening and eye closing. EEG also showed continuous rhythmic amplitude 2 to 3 Hz delta slowing in left temporal region. Additionally there is also continuous generalized polymorphic mixed frequencies with predominantly 5 to 8 Hz theta-alpha activity. Hyperventilation and photic stimulation were not performed.   ABNORMALITY - Lateralized rhythmic delta activity, left temporal region - Continuous slow, generalized IMPRESSION: This study is  suggestive of cortical dysfunction in left temporal region likely secondary underlying structural abnormality, ictal, postictal state.  Additionally there is evidence of moderate diffuse encephalopathy, nonspecific etiology.  No seizures or epileptiform discharges were seen throughout the recording. Lora Havens    CT HEAD CODE STROKE WO CONTRAST  Addendum Date: 09/29/2020   ADDENDUM REPORT: 09/29/2020 18:36 ADDENDUM: These results were called by telephone at the time of interpretation on 09/29/2020 at 6:36 pm to provider [facility] , who verbally  acknowledged these results. Code stroke imaging results were communicated on 09/29/2020 at 6:36 pm to provider Bhagat via text page Electronically Signed   By: Franchot Gallo M.D.   On: 09/29/2020 18:36   Result Date: 09/29/2020 CLINICAL DATA:  Code stroke. Acute neuro deficit. Right-sided deficit, aphasia EXAM: CT HEAD WITHOUT CONTRAST TECHNIQUE: Contiguous axial images were obtained from the base of the skull through the vertex without intravenous contrast. COMPARISON:  None. FINDINGS: Brain: Moderate amount of white matter edema is present in the left temporal lobe and left posterior frontal lobe with sparing of the cortex. There is mass-effect and 6 mm midline shift to the right. No edema on the right side. No hemorrhage. Ventricle size normal. Vascular: Negative for hyperdense vessel Skull: Negative Sinuses/Orbits: Mild mucosal edema paranasal sinuses. Negative orbit Other: None ASPECTS (Mitchell Stroke Program Early CT Score) - Ganglionic level infarction (caudate, lentiform nuclei, internal capsule, insula, M1-M3 cortex): 7 - Supraganglionic infarction (M4-M6 cortex): 3 Total score (0-10 with 10 being normal): 10 IMPRESSION: 1. Moderate amount of white matter edema in the left frontal and temporal lobe with sparing of cortex. Pattern is most likely due to tumor. Possible metastatic disease or primary brain tumor. Recommend MRI brain without and with  contrast 2. ASPECTS is 10 Electronically Signed: By: Franchot Gallo M.D. On: 09/29/2020 18:33    Assessment/Plan: Left brain tumors: I have discussed the situation with the patient via the translator.  Dr. Tommy Medal, infectious disease, feels that a biopsy/resection of 1 of these lesions would be very helpful in determining the treatment plan.  I discussed a left temporal craniotomy for tumor biopsy/resection.  I have described that surgery to the patient and her sister.  We have discussed the risks including risks of anesthesia, hemorrhage, infection, seizure, failure to make the diagnosis, medical risk, the likelihood of achieving our goals with surgery, etc.  We have also discussed the alternative of empiric treatment for her presumed infections.  I have answered all her questions.  She has decided to proceed with surgery.  LOS: 1 day     Ophelia Charter 10/01/2020, 9:47 AM

## 2020-10-01 NOTE — Progress Notes (Signed)
I have been informed that the patient's brain MRI done yesterday was not done with the BrainLAB protocol.  This will necessitate getting a BrainLAB protocol head CT today in anticipation for surgery tomorrow.

## 2020-10-01 NOTE — Progress Notes (Signed)
Pt has remained NPO today, however has continued to await procedures.  8144 Discontinue EEG order placed.  1319 EEG was called by RN and questioned when EEG would be taken off on patient since pt was awaiting MRI. Response was that they were behind and were aware she was on their list to discontinue EEG.  1513 EEG taken off by EEG lab tech.  28 RN called MRI and made them aware patient was ready for MRI ab/pelvis. MRI tech's response was that pt was on their list, but that they had several patients ahead of Candace Jones  Order for CT of head placed.  1844 CT called by RN. Response by CT tech that they were behind but were aware that pt was on list.  1919 MRI called again to question estimated time of procedure, tech responded that there "were 9 stat cases ahead of Ms Jones, and that they would not be able to get to her tonight".  Have updated night RN of today's events. Will hand off care.

## 2020-10-01 NOTE — Procedures (Addendum)
Patient Name: Candace Jones  MRN: [id number]  Epilepsy Attending: Lora Havens  Referring Physician/Provider:  Myra Rude, NP Duration: 09/30/2020 1406 to 10/01/2020 1448  Patient history: 40 year old female presented with sudden onset of aphasia right facial droop and right-sided weakness.  CT head showed possible left-sided brain mass with edema.  EEG to evaluate for seizures.  Level of alertness: Awake, asleep  AEDs during EEG study: Keppra  Technical aspects: This EEG study was done with scalp electrodes positioned according to the 10-20 International system of electrode placement. Electrical activity was acquired at a sampling rate of 500Hz  and reviewed with a high frequency filter of 70Hz  and a low frequency filter of 1Hz . EEG data were recorded continuously and digitally stored.   Description:  The posterior dominant rhythm consists of 9 Hz activity of moderate voltage (25-35 uV) seen predominantly in posterior head regions, symmetric and reactive to eye opening and eye closing.   Sleep was characterized by vertex waves, sleep spindles (12 to 14 Hz), maximal frontocentral region.  EEG also showed near continuous rhythmic amplitude 2 to 3 Hz delta slowing in left temporal region. Additionally there is also continuous generalized polymorphic mixed frequencies with predominantly 5 to 8 Hz theta-alpha activity. Hyperventilation and photic stimulation were not performed.     ABNORMALITY - Lateralized rhythmic delta activity, left temporal region - Continuous slow, generalized  IMPRESSION: This study is suggestive of cortical dysfunction in left temporal region likely secondary underlying structural abnormality, ictal, postictal state.  Additionally there is evidence of moderate diffuse encephalopathy, nonspecific etiology.  No seizures or epileptiform discharges were seen throughout the recording.  Aneka Fagerstrom Barbra Sarks

## 2020-10-01 NOTE — Progress Notes (Signed)
PROGRESS NOTE    Candace Jones  WFU:[id number] DOB: 08-13-1980 DOA: 09/29/2020 PCP: Patient, No Pcp Per (Inactive)   Chief Complain: Aphasia, right-sided weakness  Brief Narrative: Patient is a 40 year old female from [place] with no significant past medical history was brought to the emergency department with complaints of aphasia, right facial droop, right-sided weakness.  She was also found to be confused and code stroke was called in the emergency department.  She was also complaining of mild headache since last few days, also complained of severe menorrhagia.  She also had a generalized tonic-clonic seizure in the emergency department.  CT head done in the emergency department showed possible left-sided brain mass with edema. 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   metastatic process.  Neurosurgery, neurology consulted.  Planning for MRI of the abdomen and pelvis today. She was loaded with Keppra on presentation.  Lab work also showed hemoglobin of 6, transfused with PRBC.  Assessment & Plan:   Principal Problem:   Intracranial mass Active Problems:   Seizure (HCC)   Microcytic hypochromic anemia   Brain mass   Intracranial mass:  CT head done in the emergency department showed possible left-sided brain mass with edema. 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    Neurosurgery, neurology consulted.  She was loaded with Keppra on presentation.  Also started on Decadron. Neurosurgery thinks that this lesions might be from toxoplasmosis if HIV is positive.  Lumbar puncture not possible due to possible increased intracranial pressure/midline shift. 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  Follow-up CD4.  Follow-up confirmatory assays.  Toxoplasma IgG  Positive,IgM  negative. also checking QuantiFERON, histoplasma, RPR, antibody, cryptococcus, syphilis, neurocysticercosis.  ID following. If HIV is confirmed, we will also involve ophthalmology for funduscopic exam, she is complaining of blurry vision.  She also has a diffuse maculopapular skin lesions, but these lesions are healing, do not look active, patient complains of some itching.  Seizure: Had an episode of tonic-clonic seizure in the emergency department most likely acid with the brain mass/cystic lesion.  Neurology was following.  EEG showed cortical dysfunction in the left temporal region, moderate diffuse encephalopathy, no seizures or epileptiform discharges.  Continue Keppra 750 mg twice a day.  Uterine mass: CT abd/pelvis showed  significantly enlarged uterus with multiple uterine lesions within,cystic structure adjacent to the rectum on the right of uncertain significance and etiology.  Will check  MRI of the abdomen/pelvis . We will involve GYN oncology if needed.  Acute metabolic encephalopathy: Likely precipitated by seizure.  Currently much more alert and oriented   microcytic hypochromic anemia: No previous labs to compare.  Was having heavy menstrual cycles.  She was transfused 2 units of PRBC on presentation.  Monitor CBC.  Hemoglobin currently stable at 7.3.  Iron studies showed low iron, given a dose of iron infusion.  Continue oral supplementation.  Hypokalemia: Supplemented with potassium.  Nutrition Problem: Increased nutrient needs Etiology: acute illness      DVT prophylaxis:SCD Code Status: Full Family Communication: Sister at the bedside Status is: Inpatient  Remains inpatient appropriate because:Inpatient level of care appropriate due to severity of illness   Dispo: The patient is from: Home              Anticipated d/c is to: Home  Patient currently is not medically stable to d/c.   Difficult to place patient No     Consultants: Neurology,  neurosurgery  Procedures:  Antimicrobials:  Anti-infectives (From admission, onward)   None      Subjective: Patient seen and examined the bedside this morning.  Hemodynamically stable during my evaluation.  She looks much better today.  Alert, awake and mostly oriented.  Sister helped with translation.  Denies any new complaints, has blurry vision  Objective: Vitals:   09/30/20 1937 09/30/20 2348 10/01/20 0400 10/01/20 0700  BP: 103/70 (!) 130/99 130/80 116/79  Pulse: 69 83 (!) 53   Resp: (!) 21 (!) 23 15   Temp: 98.1 F (36.7 C) 97.9 F (36.6 C) 97.6 F (36.4 C) 97.9 F (36.6 C)  TempSrc: Oral Oral Oral Oral  SpO2: 100% 100% 100%   Weight:        Intake/Output Summary (Last 24 hours) at 10/01/2020 0752 Last data filed at 09/30/2020 2100 Gross per 24 hour  Intake 333 ml  Output --  Net 333 ml   Filed Weights   09/29/20 1925  Weight: 89.8 kg    Examination:  General exam: Overall comfortable, not in distress HEENT: Complains of blurry vision Respiratory system:  no wheezes or crackles  Cardiovascular system: S1 & S2 heard, RRR.  Gastrointestinal system: Abdomen is distended, soft and nontender.  Palpable mass in the lower abdomen consistent with enlarged uterus Central nervous system: Alert and oriented Extremities: No edema, no clubbing ,no cyanosis Skin: Scattered diffuse dry maculopapular rash on wrists, bilateral upper extremities and trunk   Data Reviewed: I have personally reviewed following labs and imaging studies  CBC: Recent Labs  Lab 09/29/20 1814 09/29/20 1822 09/30/20 0555  WBC 4.1  --  3.1*  NEUTROABS 1.8  --  2.0  HGB 6.6* 8.5* 7.1*  HCT 24.3* 25.0* 25.5*  MCV 61.5*  --  63.1*  PLT 213  --  591   Basic Metabolic Panel: Recent Labs  Lab 09/29/20 1814 09/29/20 1822 09/30/20 0555  NA 133* 139 136  K 3.5 3.5 3.8  CL 105 106 109  CO2 19*  --  20*  GLUCOSE 114* 111* 104*  BUN 10 10 6   CREATININE 0.57 0.50 0.48  CALCIUM 8.7*  --   8.5*   GFR: CrCl cannot be calculated (Unknown ideal weight.). Liver Function Tests: Recent Labs  Lab 09/29/20 1814 09/30/20 0555  AST 41 33  ALT 27 24  ALKPHOS 66 60  BILITOT 0.6 0.7  PROT 9.7* 9.0*  ALBUMIN 3.6 3.2*   No results for input(s): LIPASE, AMYLASE in the last 168 hours. No results for input(s): AMMONIA in the last 168 hours. Coagulation Profile: Recent Labs  Lab 09/29/20 1814  INR 1.1   Cardiac Enzymes: No results for input(s): CKTOTAL, CKMB, CKMBINDEX, TROPONINI in the last 168 hours. BNP (last 3 results) No results for input(s): PROBNP in the last 8760 hours. HbA1C: No results for input(s): HGBA1C in the last 72 hours. CBG: Recent Labs  Lab 09/29/20 1814 09/29/20 2235 09/30/20 0604 09/30/20 1227 09/30/20 1634  GLUCAP 107* 90 110* 104* 121*   Lipid Profile: No results for input(s): CHOL, HDL, LDLCALC, TRIG, CHOLHDL, LDLDIRECT in the last 72 hours. Thyroid Function Tests: No results for input(s): TSH, T4TOTAL, FREET4, T3FREE, THYROIDAB in the last 72 hours. Anemia Panel: Recent Labs    09/30/20 1109  FERRITIN 6*  TIBC 498*  IRON 18*   Sepsis Labs: No results for  input(s): PROCALCITON, LATICACIDVEN in the last 168 hours.  Recent Results (from the past 240 hour(s))  Resp Panel by RT-PCR (Flu A&B, Covid) Nasopharyngeal Swab     Status: None   Collection Time: 09/29/20 10:45 PM   Specimen: Nasopharyngeal Swab; Nasopharyngeal(NP) swabs in vial transport medium  Result Value Ref Range Status   SARS Coronavirus 2 by RT PCR NEGATIVE NEGATIVE Final    Comment: (NOTE) SARS-CoV-2 target nucleic acids are NOT DETECTED.  The SARS-CoV-2 RNA is generally detectable in upper respiratory specimens during the acute phase of infection. The lowest concentration of SARS-CoV-2 viral copies this assay can detect is 138 copies/mL. A negative result does not preclude SARS-Cov-2 infection and should not be used as the sole basis for treatment or other patient  management decisions. A negative result may occur with  improper specimen collection/handling, submission of specimen other than nasopharyngeal swab, presence of viral mutation(s) within the areas targeted by this assay, and inadequate number of viral copies(<138 copies/mL). A negative result must be combined with clinical observations, patient history, and epidemiological information. The expected result is Negative.  Fact Sheet for Patients:  EntrepreneurPulse.com.au  Fact Sheet for Healthcare Providers:  IncredibleEmployment.be  This test is no t yet approved or cleared by the [place] FDA and  has been authorized for detection and/or diagnosis of SARS-CoV-2 by FDA under an Emergency Use Authorization (EUA). This EUA will remain  in effect (meaning this test can be used) for the duration of the COVID-19 declaration under Section 564(b)(1) of the Act, 21 U.S.C.section 360bbb-3(b)(1), unless the authorization is terminated  or revoked sooner.       Influenza A by PCR NEGATIVE NEGATIVE Final   Influenza B by PCR NEGATIVE NEGATIVE Final    Comment: (NOTE) The Xpert Xpress SARS-CoV-2/FLU/RSV plus assay is intended as an aid in the diagnosis of influenza from Nasopharyngeal swab specimens and should not be used as a sole basis for treatment. Nasal washings and aspirates are unacceptable for Xpert Xpress SARS-CoV-2/FLU/RSV testing.  Fact Sheet for Patients: EntrepreneurPulse.com.au  Fact Sheet for Healthcare Providers: IncredibleEmployment.be  This test is not yet approved or cleared by the [place] FDA and has been authorized for detection and/or diagnosis of SARS-CoV-2 by FDA under an Emergency Use Authorization (EUA). This EUA will remain in effect (meaning this test can be used) for the duration of the COVID-19 declaration under Section 564(b)(1) of the Act, 21 U.S.C. section 360bbb-3(b)(1),  unless the authorization is terminated or revoked.  Performed at [facility], [place] [address] [place], [place] [address]   MRSA PCR Screening     Status: None   Collection Time: 09/30/20  1:07 AM   Specimen: Nasal Mucosa; Nasopharyngeal  Result Value Ref Range Status   MRSA by PCR NEGATIVE NEGATIVE Final    Comment:        The GeneXpert MRSA Assay (FDA approved for NASAL specimens only), is one component of a comprehensive MRSA colonization surveillance program. It is not intended to diagnose MRSA infection nor to guide or monitor treatment for MRSA infections. Performed at [facility], [place] [address], [place], [place] [address]          Radiology Studies: CT ANGIO HEAD W OR WO CONTRAST  Result Date: 09/29/2020 CLINICAL DATA:  Acute neuro deficit, aphasia.  Right-sided deficit EXAM: CT ANGIOGRAPHY HEAD AND NECK CT PERFUSION BRAIN TECHNIQUE: Multidetector CT imaging of the head and neck was performed using the standard protocol during bolus administration  of intravenous contrast. Multiplanar CT image reconstructions and MIPs were obtained to evaluate the vascular anatomy. Carotid stenosis measurements (when applicable) are obtained utilizing NASCET criteria, using the distal internal carotid diameter as the denominator. Multiphase CT imaging of the brain was performed following IV bolus contrast injection. Subsequent parametric perfusion maps were calculated using RAPID software. CONTRAST:  145mL OMNIPAQUE IOHEXOL 350 MG/ML SOLN COMPARISON:  CT head 09/29/2020 FINDINGS: CTA NECK FINDINGS Aortic arch: Standard branching. Imaged portion shows no evidence of aneurysm or dissection. No significant stenosis of the major arch vessel origins. Right carotid system: Normal right carotid. Negative for atherosclerotic disease. Left carotid system: Negative left carotid. Negative for atherosclerotic disease or stenosis. Vertebral arteries: Normal vertebral arteries bilaterally.  Skeleton: No acute skeletal abnormality. Other neck: Negative for mass or enlarged lymph node. Upper chest: Mild ground-glass density in the right upper lobe without focal infiltrate or mass. Review of the MIP images confirms the above findings CTA HEAD FINDINGS Anterior circulation: Normal anterior circulation. Negative for stenosis or large vessel occlusion. Posterior circulation: Normal posterior circulation. Negative for stenosis or large vessel occlusion. Venous sinuses: Normal venous enhancement. Anatomic variants: None Review of the MIP images confirms the above findings CT Brain Perfusion Findings: ASPECTS: 10 CBF (<30%) Volume: 30mL Perfusion (Tmax>6.0s) volume: 70mL Mismatch Volume: 95mL Infarction Location:Patchy decreased perfusion in the left frontal lobe and high right frontal lobe. IMPRESSION: Normal CT angiogram head neck. No significant atherosclerotic disease. No intracranial large vessel occlusion CT perfusion demonstrates small patchy areas of diminished perfusion left frontal and right frontal lobe of uncertain significance. No fixed infarct. Electronically Signed   By: Franchot Gallo M.D.   On: 09/29/2020 18:55   DG Ankle 2 Views Right  Result Date: 09/30/2020 CLINICAL DATA:  Ankle pain EXAM: RIGHT ANKLE - 2 VIEW COMPARISON:  X-ray ankle 09/17/2019 FINDINGS: No evidence of fracture, dislocation, or joint effusion. Redemonstration of a sclerotic appearance of the navicular bone. Soft tissues are unremarkable. IMPRESSION: No acute displaced fracture or dislocation with redemonstration of a chronic sclerotic appearing navicular. As per previous x-ray, noncontrast CT or MRI of the right focal may be valuable for further evaluation if clinically indicated. Electronically Signed   By: Iven Finn M.D.   On: 09/30/2020 21:01   CT ANGIO NECK W OR WO CONTRAST  Result Date: 09/29/2020 CLINICAL DATA:  Acute neuro deficit, aphasia.  Right-sided deficit EXAM: CT ANGIOGRAPHY HEAD AND NECK CT  PERFUSION BRAIN TECHNIQUE: Multidetector CT imaging of the head and neck was performed using the standard protocol during bolus administration of intravenous contrast. Multiplanar CT image reconstructions and MIPs were obtained to evaluate the vascular anatomy. Carotid stenosis measurements (when applicable) are obtained utilizing NASCET criteria, using the distal internal carotid diameter as the denominator. Multiphase CT imaging of the brain was performed following IV bolus contrast injection. Subsequent parametric perfusion maps were calculated using RAPID software. CONTRAST:  169mL OMNIPAQUE IOHEXOL 350 MG/ML SOLN COMPARISON:  CT head 09/29/2020 FINDINGS: CTA NECK FINDINGS Aortic arch: Standard branching. Imaged portion shows no evidence of aneurysm or dissection. No significant stenosis of the major arch vessel origins. Right carotid system: Normal right carotid. Negative for atherosclerotic disease. Left carotid system: Negative left carotid. Negative for atherosclerotic disease or stenosis. Vertebral arteries: Normal vertebral arteries bilaterally. Skeleton: No acute skeletal abnormality. Other neck: Negative for mass or enlarged lymph node. Upper chest: Mild ground-glass density in the right upper lobe without focal infiltrate or mass. Review of the MIP images confirms the above findings  CTA HEAD FINDINGS Anterior circulation: Normal anterior circulation. Negative for stenosis or large vessel occlusion. Posterior circulation: Normal posterior circulation. Negative for stenosis or large vessel occlusion. Venous sinuses: Normal venous enhancement. Anatomic variants: None Review of the MIP images confirms the above findings CT Brain Perfusion Findings: ASPECTS: 10 CBF (<30%) Volume: 18mL Perfusion (Tmax>6.0s) volume: 31mL Mismatch Volume: 58mL Infarction Location:Patchy decreased perfusion in the left frontal lobe and high right frontal lobe. IMPRESSION: Normal CT angiogram head neck. No significant  atherosclerotic disease. No intracranial large vessel occlusion CT perfusion demonstrates small patchy areas of diminished perfusion left frontal and right frontal lobe of uncertain significance. No fixed infarct. Electronically Signed   By: Franchot Gallo M.D.   On: 09/29/2020 18:55   MR Brain W and Wo Contrast  Result Date: 09/30/2020 CLINICAL DATA:  Right-sided facial droop with aphasia. Left-sided body weakness EXAM: MRI HEAD WITHOUT AND WITH CONTRAST TECHNIQUE: Multiplanar, multiecho pulse sequences of the brain and surrounding structures were obtained without and with intravenous contrast. CONTRAST:  26mL GADAVIST GADOBUTROL 1 MMOL/ML IV SOLN COMPARISON:  CT and CTA from yesterday FINDINGS: Brain: 2 ring-enhancing comma shaped brain lesions located along the gray-white matter junction of the posterior left temporal lobe and lateral left frontal lobe, measuring up to 21 mm in maximal span at the posterior temporal lobe. No associated restricted diffusion to imply pyogenic abscess. No discontinuous enhancement to imply tumefactive demyelination. Both lesions have a very similar morphology with a fairly smoothly contoured rim and adjacent nodular thickening with infolding of the wall. There is extensive adjacent vasogenic edema with local mass effect and mild rightward midline shift. No acute infarct, hydrocephalus, or acute hemorrhage. Small presumed arachnoid cyst is seen at the right vertex with mild local mass effect. Vascular: Preserved flow voids and vascular enhancement Skull and upper cervical spine: Normal marrow signal. Sinuses/Orbits: Opacified right frontal sinus with proteinaceous features. 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 Metastatic disease is the leading diagnostic consideration but the mass morphology is notable for a discrete nodular component, colloid vesicular neurocysticercosis should also be  considered. Electronically Signed   By: Monte Fantasia M.D.   On: 09/30/2020 05:32   CT CHEST ABDOMEN PELVIS W CONTRAST  Result Date: 09/29/2020 CLINICAL DATA:  Intracranial mass, rule out primary lesion EXAM: CT CHEST, ABDOMEN, AND PELVIS WITH CONTRAST TECHNIQUE: Multidetector CT imaging of the chest, abdomen and pelvis was performed following the standard protocol during bolus administration of intravenous contrast. CONTRAST:  87mL OMNIPAQUE IOHEXOL 300 MG/ML  SOLN COMPARISON:  None. FINDINGS: CT CHEST FINDINGS Cardiovascular: Thoracic aorta and its branches are within normal limits. No aneurysmal dilatation is seen. The pulmonary artery as visualized is within normal limits. Heart is not significantly enlarged. Mediastinum/Nodes: Thoracic inlet is within normal limits. No hilar or mediastinal adenopathy is noted. The esophagus as visualized is within normal limits. Lungs/Pleura: The lungs are well aerated bilaterally. Mild dependent atelectatic changes are seen. No focal parenchymal nodule is noted. No sizable effusion or pneumothorax is noted. Musculoskeletal: Degenerative changes of the thoracic spine are noted. No rib abnormality is seen. No specific breast mass is noted. CT ABDOMEN PELVIS FINDINGS Hepatobiliary: No focal liver abnormality is seen. No gallstones, gallbladder wall thickening, or biliary dilatation. Pancreas: Unremarkable. No pancreatic ductal dilatation or surrounding inflammatory changes. Spleen: Normal in size without focal abnormality. Adrenals/Urinary Tract: Adrenal glands are within normal limits. Kidneys demonstrate a normal enhancement pattern with normal excretion  bilaterally. No obstructive changes are seen. Bladder is well distended with opacified urine consistent with prior contrast administration. Stomach/Bowel: No obstructive or inflammatory changes of the colon are noted. The appendix is well visualized and within normal limits. Small bowel and stomach appear within normal  limits. Vascular/Lymphatic: No significant vascular findings are present. No enlarged abdominal or pelvic lymph nodes. Reproductive: Uterus is well visualized and demonstrates bulky enlargement with multifocal lesions. The largest of these measures at least 10 cm in dimension. These likely represent uterine fibroids although the possibility of a more aggressive lesion could not be totally excluded. Other: Adjacent to the rectum on the right there is a somewhat multiloculated cystic structure which measures approximately 4.6 by 3.0 cm. This is of uncertain etiology. Possibility of small perirectal abscess would deserve consideration. Alternatively this could represent a cystic metastatic lesion. Musculoskeletal: No acute bony abnormality is noted. IMPRESSION: CT of the chest: No acute intrathoracic abnormality is noted. CT of the abdomen and pelvis: Significantly enlarged uterus with multiple uterine lesions within. These may simply represent uterine fibroids although the possibility of a more aggressive process could not be excluded given the changes in the head on recent CT. MRI would be helpful on a nonemergent basis when the patient's condition has improved for further evaluation. Cystic structure adjacent to the rectum on the right of uncertain significance and etiology. This could also be evaluated with MRI of the pelvis. No other focal abnormality is noted. Electronically Signed   By: Inez Catalina M.D.   On: 09/29/2020 20:45   CT CEREBRAL PERFUSION W CONTRAST  Result Date: 09/29/2020 CLINICAL DATA:  Acute neuro deficit, aphasia.  Right-sided deficit EXAM: CT ANGIOGRAPHY HEAD AND NECK CT PERFUSION BRAIN TECHNIQUE: Multidetector CT imaging of the head and neck was performed using the standard protocol during bolus administration of intravenous contrast. Multiplanar CT image reconstructions and MIPs were obtained to evaluate the vascular anatomy. Carotid stenosis measurements (when applicable) are obtained  utilizing NASCET criteria, using the distal internal carotid diameter as the denominator. Multiphase CT imaging of the brain was performed following IV bolus contrast injection. Subsequent parametric perfusion maps were calculated using RAPID software. CONTRAST:  138mL OMNIPAQUE IOHEXOL 350 MG/ML SOLN COMPARISON:  CT head 09/29/2020 FINDINGS: CTA NECK FINDINGS Aortic arch: Standard branching. Imaged portion shows no evidence of aneurysm or dissection. No significant stenosis of the major arch vessel origins. Right carotid system: Normal right carotid. Negative for atherosclerotic disease. Left carotid system: Negative left carotid. Negative for atherosclerotic disease or stenosis. Vertebral arteries: Normal vertebral arteries bilaterally. Skeleton: No acute skeletal abnormality. Other neck: Negative for mass or enlarged lymph node. Upper chest: Mild ground-glass density in the right upper lobe without focal infiltrate or mass. Review of the MIP images confirms the above findings CTA HEAD FINDINGS Anterior circulation: Normal anterior circulation. Negative for stenosis or large vessel occlusion. Posterior circulation: Normal posterior circulation. Negative for stenosis or large vessel occlusion. Venous sinuses: Normal venous enhancement. Anatomic variants: None Review of the MIP images confirms the above findings CT Brain Perfusion Findings: ASPECTS: 10 CBF (<30%) Volume: 53mL Perfusion (Tmax>6.0s) volume: 46mL Mismatch Volume: 35mL Infarction Location:Patchy decreased perfusion in the left frontal lobe and high right frontal lobe. IMPRESSION: Normal CT angiogram head neck. No significant atherosclerotic disease. No intracranial large vessel occlusion CT perfusion demonstrates small patchy areas of diminished perfusion left frontal and right frontal lobe of uncertain significance. No fixed infarct. Electronically Signed   By: Franchot Gallo M.D.   On: 09/29/2020  18:55   EEG adult  Result Date: 09/30/2020 Lora Havens, MD     09/30/2020  2:07 PM Patient Name: Candace Jones MRN: [id number] Epilepsy Attending: Lora Havens Referring Physician/Provider: Dr. Gean Birchwood Date: 09/30/2020 Duration: 23.02 mins Patient history: 40 year old female presented with sudden onset of aphasia right facial droop and right-sided weakness.  CT head showed possible left-sided brain mass with edema.  EEG to evaluate for seizures. Level of alertness: Awake AEDs during EEG study: Keppra Technical aspects: This EEG study was done with scalp electrodes positioned according to the 10-20 International system of electrode placement. Electrical activity was acquired at a sampling rate of 500Hz  and reviewed with a high frequency filter of 70Hz  and a low frequency filter of 1Hz . EEG data were recorded continuously and digitally stored. Description:  The posterior dominant rhythm consists of 9 Hz activity of moderate voltage (25-35 uV) seen predominantly in posterior head regions, symmetric and reactive to eye opening and eye closing. EEG also showed continuous rhythmic amplitude 2 to 3 Hz delta slowing in left temporal region. Additionally there is also continuous generalized polymorphic mixed frequencies with predominantly 5 to 8 Hz theta-alpha activity. Hyperventilation and photic stimulation were not performed.   ABNORMALITY - Lateralized rhythmic delta activity, left temporal region - Continuous slow, generalized IMPRESSION: This study is suggestive of cortical dysfunction in left temporal region likely secondary underlying structural abnormality, ictal, postictal state.  Additionally there is evidence of moderate diffuse encephalopathy, nonspecific etiology.  No seizures or epileptiform discharges were seen throughout the recording. Lora Havens    CT HEAD CODE STROKE WO CONTRAST  Addendum Date: 09/29/2020   ADDENDUM REPORT: 09/29/2020 18:36 ADDENDUM: These results were called by telephone at the time of interpretation on  09/29/2020 at 6:36 pm to provider [facility] , who verbally acknowledged these results. Code stroke imaging results were communicated on 09/29/2020 at 6:36 pm to provider Bhagat via text page Electronically Signed   By: Franchot Gallo M.D.   On: 09/29/2020 18:36   Result Date: 09/29/2020 CLINICAL DATA:  Code stroke. Acute neuro deficit. Right-sided deficit, aphasia EXAM: CT HEAD WITHOUT CONTRAST TECHNIQUE: Contiguous axial images were obtained from the base of the skull through the vertex without intravenous contrast. COMPARISON:  None. FINDINGS: Brain: Moderate amount of white matter edema is present in the left temporal lobe and left posterior frontal lobe with sparing of the cortex. There is mass-effect and 6 mm midline shift to the right. No edema on the right side. No hemorrhage. Ventricle size normal. Vascular: Negative for hyperdense vessel Skull: Negative Sinuses/Orbits: Mild mucosal edema paranasal sinuses. Negative orbit Other: None ASPECTS ([place] Stroke Program Early CT Score) - Ganglionic level infarction (caudate, lentiform nuclei, internal capsule, insula, M1-M3 cortex): 7 - Supraganglionic infarction (M4-M6 cortex): 3 Total score (0-10 with 10 being normal): 10 IMPRESSION: 1. Moderate amount of white matter edema in the left frontal and temporal lobe with sparing of cortex. Pattern is most likely due to tumor. Possible metastatic disease or primary brain tumor. Recommend MRI brain without and with contrast 2. ASPECTS is 10 Electronically Signed: By: Franchot Gallo M.D. On: 09/29/2020 18:33        Scheduled Meds: . dexamethasone (DECADRON) injection  6 mg Intravenous Q6H  . feeding supplement  237 mL Oral BID BM  . ferrous sulfate  325 mg Oral Q breakfast  . nystatin  5 mL Oral QID  . sodium chloride flush  3 mL Intravenous Once  Continuous Infusions: . levETIRAcetam 750 mg (09/30/20 2019)     LOS: 1 day    Time spent: 35 mins,More than 50% of that time was spent in  counseling and/or coordination of care.      Shelly Coss, MD [facility] P4/20/2022, 7:52 AM

## 2020-10-01 NOTE — Progress Notes (Addendum)
Neurology Progress Note  S: Sister acts as Veterinary surgeon. Patient gives permission to discuss medical issues in the presence of her sister.  No seizure like activity overnight. States she is on her period. Has no itching, numbness, or tingling to extremities. Per sister (acting as interpretor) she is less confused and her speech is normal now. No HA, n/v.  + blurred vision which makes it difficult to read.   Since yesterday, ID has seen patient and ordered several tests, some of which are pending. 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 Toxoplasmosis IgG ratio 267 and toxoplasmosis Ab IgM < 3. 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 Cryptococcal is negative.   O: Current vital signs: BP 116/79 (BP Location: Right Arm)   Pulse (!) 53   Temp 97.9 F (36.6 C) (Oral)   Resp 15   Wt 89.8 kg   SpO2 100%   BMI 30.00 kg/m  Vital signs in last 24 hours: Temp:  [97.6 F (36.4 C)-98.1 F (36.7 C)] 97.9 F (36.6 C) (04/20 0700) Pulse Rate:  [53-83] 53 (04/20 0400) Resp:  [15-23] 15 (04/20 0400) BP: (103-130)/(70-99) 116/79 (04/20 0700) SpO2:  [100 %] 100 % (04/20 0400)  GENERAL: Awake, alert in NAD HEENT: Normocephalic and atraumatic LUNGS: Normal respiratory effort.  CV: RRR  Skin: warm and dry. Multiple dark colored lesions to face, arms and legs.  Ext: warm  NEURO:  Mental Status: AA&O to self, sister. When asked where she is, she points to the [facility] logo in the room. She is unaware of month, day, date, or year ("192020"). Speech/Language: speech is without aphasia or dysarthria.  Naming intact to phone, pen, and watch. Comprehension intact to [place] speech by sister. She understands simple questions in [place]. She perseverates at times, example: when asked to name letters, she will say numbers at times.   Cranial Nerves:  II: PERRL. Visual fields full.  III, IV, VI: EOMI. Eyelids elevate symmetrically.  V: Sensation is intact to light touch and symmetrical to face.   VII: Smile is symmetrical. VIII: hearing intact to voice. IX, X: Palate elevates symmetrically. Phonation is normal.  IR:SWNIOEVO shrug 5/5. XII: tongue is midline without fasciculations. Motor: 5/5 strength to all muscle groups tested.  Tone: is normal and bulk is normal Sensation- Intact to light touch bilaterally. Extinction abseht to light touch with DSS.    Coordination: FNF intact.  Gait- deferred  Medications  Current Facility-Administered Medications:  .  acetaminophen (TYLENOL) tablet 650 mg, 650 mg, Oral, Q6H PRN **OR** acetaminophen (TYLENOL) suppository 650 mg, 650 mg, Rectal, Q6H PRN, Rise Patience, MD .  dexamethasone (DECADRON) injection 6 mg, 6 mg, Intravenous, Q6H, Adhikari, Amrit, MD .  feeding supplement (ENSURE ENLIVE / ENSURE PLUS) liquid 237 mL, 237 mL, Oral, BID BM, Adhikari, Amrit, MD, 237 mL at 09/30/20 1815 .  ferrous sulfate tablet 325 mg, 325 mg, Oral, Q breakfast, Adhikari, Amrit, MD, 325 mg at 09/30/20 1811 .  levETIRAcetam (KEPPRA) 750 mg in sodium chloride 0.9 % 100 mL IVPB, 750 mg, Intravenous, Q12H, Briant Angelillo L, MD, Last Rate: 430 mL/hr at 09/30/20 2019, 750 mg at 09/30/20 2019 .  LORazepam (ATIVAN) injection 2 mg, 2 mg, Intravenous, PRN, Ebbie Latus, Stevi W, NP .  nystatin (MYCOSTATIN) 100000 UNIT/ML suspension 500,000 Units, 5 mL, Oral, QID, Tommy Medal, Lavell Islam, MD, 500,000 Units at 09/30/20 2117 .  sodium chloride flush (NS) 0.9 % injection 3 mL, 3 mL, Intravenous, Once, [place], [place]  N, MD  Pertinent Labs Hgb 7.3     Glucose 99    Cryptococcal Ag negative    HIV reactive  Ref. Range   Toxoplasma Antibody- IgM Latest Ref Range: 0.0 - 7.9 AU/mL   Toxoplasma gondii Ab,IgG,Qn Latest Ref Range: 0.0 - 7.1 IU/mL       Imaging MD has reviewed images in epic and the results pertinent to this consultation are:  CT Head 1. Moderate amount of white matter edema in the left frontal and temporal lobe with sparing of cortex. Pattern is most  likely due to tumor. Possible metastatic disease or primary brain tumor. Recommend MRI brain without and with contrast  CTA head and neck-normal  LTM EEG 4/19 - 4/20:  This studyis suggestive of cortical dysfunction in left temporal region likely secondary underlying structural abnormality, ictal, postictal state. Additionally there is evidence of moderate diffuse encephalopathy, nonspecific etiology. No seizures or epileptiform discharges were seen throughout the recording.  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  Assessment: 40 yo [place] speaking female who presented on 09/30/20 with confusion, right facial droop, and aphasia. Initial imaging concerning for mass/tumor with vasogenic edema in left frontal and temporal lobes with midline shift. Witnessed seizure with fall in the ED and patient was loaded with Keppra and placed on Keppra 750mg  po bid. There has been no noted seizure activity since. Her EEG showed slowing with cortical dysfunction of left temporal region secondary to structural abnormality, ictal, or post ictal state. LTM is in place and will discontinue that based on her clinical improvement. She remains somewhat confused today with questions but compared to yesterday's visit, she is improved.   Labs per ID pending: urine histoplasma, toxoplasma Ab, syphilis, TB, confirmatory HIV assays, HIV I RNA, and HIV genotype, Cd4 count, are pending.   MRI abdomen and pelvis for uterine and rectal lesions found on metastatic workup.   Impression: 1. Seizure-no further seizure activity. Will d/c LTM. Should patient be noted to have seizure activity, we can increase the Keppra.  2. Continue Keppra 750mg  po/IV q12 hours.   2. Ativan 2mg  prn seizure lasting > 5 minutes.  3. Tumor management per NSU team. She is on Decadron. 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 Test pending.   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  5. Metastatic workup-per medicine team.   6. Agree with ophthalmology eval 7. Neurology will be available on an as needed basis going forward, please reach out if new questions arise  Standard seizure precautions discussed with patient (though she perseverated on potential neurosurgery) and sister at bedside, please include in discharge instructions:  Per [facility] statutes, patients with seizures are not allowed to drive until  they have been seizure-free for six months. Use caution when using heavy equipment or power tools. Avoid working on ladders or at heights. Take showers instead of baths. Ensure the water temperature is not too high on the home water heater. Do not go swimming alone. When caring for infants or small children, sit down when holding, feeding, or changing them to minimize risk of injury to the child in the event you have a seizure.  To reduce risk of seizures, maintain good sleep hygiene avoid alcohol and illicit drug use, take all anti-seizure medications as  prescribed.   Pt seen by Clance Boll, MSN, APN-BC/Nurse Practitioner/Neuro and later by MD. Note and plan to be edited as needed by MD.  Pager: [phone]

## 2020-10-01 NOTE — Progress Notes (Signed)
SLP Cancellation Note  Patient Details Name: Candace Jones MRN: [id number] DOB: 1981-04-18   Cancelled treatment:       Reason Eval/Treat Not Completed: Medical issues which prohibited therapy. Per RN, pt is NPO pending MRI. Will f/u as able once able to resume POs.     Osie Bond., M.A. [place] [facility] Pager [phone] Office [phone]  10/01/2020, 8:34 AM

## 2020-10-01 NOTE — Progress Notes (Signed)
vLTM EEG complete. No skin breakdown 

## 2020-10-02 ENCOUNTER — Inpatient Hospital Stay (HOSPITAL_COMMUNITY)
Admission: EM | Disposition: A | Discharge status: Home / Self Care | Payer: Self-pay | Source: Home / Self Care | Attending: Internal Medicine

## 2020-10-02 ENCOUNTER — Encounter (HOSPITAL_COMMUNITY): Payer: Self-pay | Admitting: Internal Medicine

## 2020-10-02 ENCOUNTER — Inpatient Hospital Stay (HOSPITAL_COMMUNITY): Payer: Medicaid Other

## 2020-10-02 DIAGNOSIS — D496 Neoplasm of unspecified behavior of brain: Secondary | ICD-10-CM | POA: Diagnosis not present

## 2020-10-02 DIAGNOSIS — B582 Toxoplasma meningoencephalitis: Secondary | ICD-10-CM

## 2020-10-02 DIAGNOSIS — B20 Human immunodeficiency virus [HIV] disease: Principal | ICD-10-CM | POA: Diagnosis present

## 2020-10-02 DIAGNOSIS — R900 Intracranial space-occupying lesion found on diagnostic imaging of central nervous system: Secondary | ICD-10-CM | POA: Diagnosis not present

## 2020-10-02 DIAGNOSIS — M25571 Pain in right ankle and joints of right foot: Secondary | ICD-10-CM | POA: Diagnosis not present

## 2020-10-02 HISTORY — DX: Toxoplasma meningoencephalitis: B58.2

## 2020-10-02 HISTORY — PX: CRANIOTOMY: SHX93

## 2020-10-02 HISTORY — PX: APPLICATION OF CRANIAL NAVIGATION: SHX6578

## 2020-10-02 LAB — CBC WITH DIFFERENTIAL/PLATELET
Abs Immature Granulocytes: 0.05 10*3/uL (ref 0.00–0.07)
Basophils Absolute: 0 10*3/uL (ref 0.0–0.1)
Basophils Relative: 0 %
Eosinophils Absolute: 0 10*3/uL (ref 0.0–0.5)
Eosinophils Relative: 0 %
HCT: 25.5 % — ABNORMAL LOW (ref 36.0–46.0)
Hemoglobin: 7.1 g/dL — ABNORMAL LOW (ref 12.0–15.0)
Immature Granulocytes: 1 %
Lymphocytes Relative: 19 %
Lymphs Abs: 0.8 10*3/uL (ref 0.7–4.0)
MCH: 18 pg — ABNORMAL LOW (ref 26.0–34.0)
MCHC: 27.8 g/dL — ABNORMAL LOW (ref 30.0–36.0)
MCV: 64.6 fL — ABNORMAL LOW (ref 80.0–100.0)
Monocytes Absolute: 0.1 10*3/uL (ref 0.1–1.0)
Monocytes Relative: 3 %
Neutro Abs: 3.4 10*3/uL (ref 1.7–7.7)
Neutrophils Relative %: 77 %
Platelets: 196 10*3/uL (ref 150–400)
RBC: 3.95 MIL/uL (ref 3.87–5.11)
RDW: 26.5 % — ABNORMAL HIGH (ref 11.5–15.5)
WBC: 4.4 10*3/uL (ref 4.0–10.5)
nRBC: 0 % (ref 0.0–0.2)

## 2020-10-02 LAB — BASIC METABOLIC PANEL
Anion gap: 6 (ref 5–15)
BUN: 12 mg/dL (ref 6–20)
CO2: 21 mmol/L — ABNORMAL LOW (ref 22–32)
Calcium: 8.8 mg/dL — ABNORMAL LOW (ref 8.9–10.3)
Chloride: 109 mmol/L (ref 98–111)
Creatinine, Ser: 0.49 mg/dL (ref 0.44–1.00)
GFR, Estimated: >60 mL/min (ref >60)
Glucose, Bld: 115 mg/dL — ABNORMAL HIGH (ref 70–99)
Potassium: 4.4 mmol/L (ref 3.5–5.1)
Sodium: 136 mmol/L (ref 135–145)

## 2020-10-02 LAB — PREPARE RBC (CROSSMATCH)

## 2020-10-02 LAB — POCT I-STAT 7, (LYTES, BLD GAS, ICA,H+H)
Acid-base deficit: 2 mmol/L (ref 0.0–2.0)
Bicarbonate: 22.5 mmol/L (ref 20.0–28.0)
Calcium, Ion: 1.21 mmol/L (ref 1.15–1.40)
HCT: 27 % — ABNORMAL LOW (ref 36.0–46.0)
Hemoglobin: 9.2 g/dL — ABNORMAL LOW (ref 12.0–15.0)
O2 Saturation: 100 %
Patient temperature: 35.5
Potassium: 3.9 mmol/L (ref 3.5–5.1)
Sodium: 140 mmol/L (ref 135–145)
TCO2: 24 mmol/L (ref 22–32)
pCO2 arterial: 33.8 mmHg (ref 32.0–48.0)
pH, Arterial: 7.425 (ref 7.350–7.450)
pO2, Arterial: 279 mmHg — ABNORMAL HIGH (ref 83.0–108.0)

## 2020-10-02 LAB — GLUCOSE, CAPILLARY
Glucose-Capillary: 114 mg/dL — ABNORMAL HIGH (ref 70–99)
Glucose-Capillary: 115 mg/dL — ABNORMAL HIGH (ref 70–99)
Glucose-Capillary: 122 mg/dL — ABNORMAL HIGH (ref 70–99)
Glucose-Capillary: 127 mg/dL — ABNORMAL HIGH (ref 70–99)

## 2020-10-02 LAB — T.PALLIDUM AB, TOTAL: T Pallidum Abs: NONREACTIVE

## 2020-10-02 LAB — RPR
RPR Ser Ql: REACTIVE — AB
RPR Titer: 1:1 {titer}

## 2020-10-02 SURGERY — CRANIOTOMY TUMOR EXCISION
Anesthesia: General | Site: Head | Laterality: Left

## 2020-10-02 MED ORDER — SUGAMMADEX SODIUM 500 MG/5ML IV SOLN
INTRAVENOUS | Status: AC
  Filled 2020-10-02: qty 5

## 2020-10-02 MED ORDER — DEXAMETHASONE SODIUM PHOSPHATE 10 MG/ML IJ SOLN
INTRAMUSCULAR | Status: DC | PRN
  Administered 2020-10-02: 10 mg via INTRAVENOUS

## 2020-10-02 MED ORDER — LABETALOL HCL 5 MG/ML IV SOLN
10.0000 mg | INTRAVENOUS | Status: DC | PRN
Start: 2020-10-02 — End: 2020-10-03
  Filled 2020-10-02: qty 4

## 2020-10-02 MED ORDER — BACITRACIN ZINC 500 UNIT/GM EX OINT
TOPICAL_OINTMENT | CUTANEOUS | Status: AC
  Filled 2020-10-02: qty 28.35

## 2020-10-02 MED ORDER — SODIUM CHLORIDE 0.9 % IV SOLN: INTRAVENOUS | Status: DC | PRN

## 2020-10-02 MED ORDER — DEXAMETHASONE SODIUM PHOSPHATE 4 MG/ML IJ SOLN
4.0000 mg | Freq: Four times a day (QID) | INTRAMUSCULAR | Status: AC
  Administered 2020-10-03 – 2020-10-04 (×4): 4 mg via INTRAVENOUS
  Filled 2020-10-02 (×4): qty 1

## 2020-10-02 MED ORDER — BACITRACIN ZINC 500 UNIT/GM EX OINT
TOPICAL_OINTMENT | CUTANEOUS | Status: DC | PRN
  Administered 2020-10-02: 1 via TOPICAL

## 2020-10-02 MED ORDER — ACETAMINOPHEN 10 MG/ML IV SOLN
INTRAVENOUS | Status: AC
  Filled 2020-10-02: qty 100

## 2020-10-02 MED ORDER — PROPOFOL 10 MG/ML IV BOLUS
INTRAVENOUS | Status: DC | PRN
  Administered 2020-10-02: 60 mg via INTRAVENOUS
  Administered 2020-10-02: 200 mg via INTRAVENOUS

## 2020-10-02 MED ORDER — HYDROCODONE-ACETAMINOPHEN 5-325 MG PO TABS
1.0000 | ORAL_TABLET | ORAL | Status: DC | PRN
Start: 2020-10-02 — End: 2020-10-10
  Filled 2020-10-02: qty 1

## 2020-10-02 MED ORDER — FENTANYL CITRATE (PF) 250 MCG/5ML IJ SOLN
INTRAMUSCULAR | Status: AC
  Filled 2020-10-02: qty 5

## 2020-10-02 MED ORDER — ORAL CARE MOUTH RINSE: 15.0000 mL | Freq: Once | OROMUCOSAL | Status: AC

## 2020-10-02 MED ORDER — CHLORHEXIDINE GLUCONATE CLOTH 2 % EX PADS
6.0000 | MEDICATED_PAD | Freq: Every day | CUTANEOUS | Status: DC
  Administered 2020-10-02 – 2020-10-09 (×4): 6 via TOPICAL

## 2020-10-02 MED ORDER — MORPHINE SULFATE (PF) 2 MG/ML IV SOLN
2.0000 mg | INTRAVENOUS | Status: DC | PRN
  Administered 2020-10-03 – 2020-10-09 (×2): 2 mg via INTRAVENOUS
  Filled 2020-10-02 (×2): qty 1

## 2020-10-02 MED ORDER — CHLORHEXIDINE GLUCONATE 0.12 % MT SOLN
15.0000 mL | Freq: Once | OROMUCOSAL | Status: AC
Start: 2020-10-02 — End: 2020-10-02

## 2020-10-02 MED ORDER — THROMBIN 5000 UNITS EX SOLR
CUTANEOUS | Status: AC
  Filled 2020-10-02: qty 10000

## 2020-10-02 MED ORDER — ESMOLOL HCL 100 MG/10ML IV SOLN
INTRAVENOUS | Status: DC | PRN
  Administered 2020-10-02: 10 mg via INTRAVENOUS
  Administered 2020-10-02: 30 mg via INTRAVENOUS

## 2020-10-02 MED ORDER — GADOBUTROL 1 MMOL/ML IV SOLN
9.0000 mL | Freq: Once | INTRAVENOUS | Status: AC | PRN
  Administered 2020-10-02: 9 mL via INTRAVENOUS

## 2020-10-02 MED ORDER — THROMBIN 5000 UNITS EX SOLR: OROMUCOSAL | Status: DC | PRN

## 2020-10-02 MED ORDER — SODIUM CHLORIDE 0.9 % IV SOLN: INTRAVENOUS | Status: DC

## 2020-10-02 MED ORDER — ACETAMINOPHEN 325 MG PO TABS
650.0000 mg | ORAL_TABLET | ORAL | Status: DC | PRN
Start: 2020-10-02 — End: 2020-10-10

## 2020-10-02 MED ORDER — DEXAMETHASONE SODIUM PHOSPHATE 4 MG/ML IJ SOLN
4.0000 mg | Freq: Three times a day (TID) | INTRAMUSCULAR | Status: DC
  Administered 2020-10-05 – 2020-10-08 (×10): 4 mg via INTRAVENOUS
  Filled 2020-10-02 (×11): qty 1

## 2020-10-02 MED ORDER — CHLORHEXIDINE GLUCONATE 0.12 % MT SOLN
OROMUCOSAL | Status: AC
  Administered 2020-10-02: 15 mL via OROMUCOSAL
  Filled 2020-10-02: qty 15

## 2020-10-02 MED ORDER — THROMBIN 5000 UNITS EX SOLR
CUTANEOUS | Status: AC
  Filled 2020-10-02: qty 5000

## 2020-10-02 MED ORDER — ROCURONIUM BROMIDE 100 MG/10ML IV SOLN
INTRAVENOUS | Status: DC | PRN
  Administered 2020-10-02: 60 mg via INTRAVENOUS
  Administered 2020-10-02 (×2): 20 mg via INTRAVENOUS

## 2020-10-02 MED ORDER — ONDANSETRON HCL 4 MG/2ML IJ SOLN
INTRAMUSCULAR | Status: DC | PRN
  Administered 2020-10-02: 4 mg via INTRAVENOUS

## 2020-10-02 MED ORDER — CEFAZOLIN SODIUM-DEXTROSE 2-3 GM-%(50ML) IV SOLR
INTRAVENOUS | Status: DC | PRN
  Administered 2020-10-02: 2 g via INTRAVENOUS

## 2020-10-02 MED ORDER — BUPIVACAINE-EPINEPHRINE 0.5% -1:200000 IJ SOLN
INTRAMUSCULAR | Status: DC | PRN
  Administered 2020-10-02: 10 mL

## 2020-10-02 MED ORDER — DOCUSATE SODIUM 100 MG PO CAPS
100.0000 mg | ORAL_CAPSULE | Freq: Two times a day (BID) | ORAL | Status: DC
  Administered 2020-10-02 – 2020-10-10 (×16): 100 mg via ORAL
  Filled 2020-10-02 (×16): qty 1

## 2020-10-02 MED ORDER — BUPIVACAINE-EPINEPHRINE (PF) 0.25% -1:200000 IJ SOLN
INTRAMUSCULAR | Status: AC
  Filled 2020-10-02: qty 30

## 2020-10-02 MED ORDER — SUGAMMADEX SODIUM 200 MG/2ML IV SOLN
INTRAVENOUS | Status: DC | PRN
  Administered 2020-10-02: 200 mg via INTRAVENOUS
  Administered 2020-10-02: 100 mg via INTRAVENOUS
  Administered 2020-10-02: 200 mg via INTRAVENOUS

## 2020-10-02 MED ORDER — PHENYLEPHRINE HCL-NACL 10-0.9 MG/250ML-% IV SOLN
INTRAVENOUS | Status: DC | PRN
  Administered 2020-10-02: 10 ug/min via INTRAVENOUS

## 2020-10-02 MED ORDER — ONDANSETRON HCL 4 MG PO TABS: 4.0000 mg | ORAL_TABLET | ORAL | Status: DC | PRN

## 2020-10-02 MED ORDER — ACETAMINOPHEN 10 MG/ML IV SOLN
INTRAVENOUS | Status: DC | PRN
  Administered 2020-10-02: 1000 mg via INTRAVENOUS

## 2020-10-02 MED ORDER — 0.9 % SODIUM CHLORIDE (POUR BTL) OPTIME
TOPICAL | Status: DC | PRN
  Administered 2020-10-02: 1000 mL

## 2020-10-02 MED ORDER — ONDANSETRON HCL 4 MG/2ML IJ SOLN: 4.0000 mg | INTRAMUSCULAR | Status: DC | PRN

## 2020-10-02 MED ORDER — FENTANYL CITRATE (PF) 250 MCG/5ML IJ SOLN
INTRAMUSCULAR | Status: DC | PRN
  Administered 2020-10-02: 25 ug via INTRAVENOUS
  Administered 2020-10-02: 100 ug via INTRAVENOUS

## 2020-10-02 MED ORDER — CEFAZOLIN SODIUM-DEXTROSE 2-4 GM/100ML-% IV SOLN
INTRAVENOUS | Status: AC
  Filled 2020-10-02: qty 100

## 2020-10-02 MED ORDER — PANTOPRAZOLE SODIUM 40 MG IV SOLR
40.0000 mg | Freq: Every day | INTRAVENOUS | Status: DC
  Administered 2020-10-02 – 2020-10-05 (×4): 40 mg via INTRAVENOUS
  Filled 2020-10-02 (×4): qty 40

## 2020-10-02 MED ORDER — PROMETHAZINE HCL 25 MG PO TABS: 12.5000 mg | ORAL_TABLET | ORAL | Status: DC | PRN

## 2020-10-02 MED ORDER — LEVETIRACETAM IN NACL 500 MG/100ML IV SOLN
500.0000 mg | Freq: Two times a day (BID) | INTRAVENOUS | Status: DC
  Administered 2020-10-02 – 2020-10-03 (×2): 500 mg via INTRAVENOUS
  Filled 2020-10-02 (×3): qty 100

## 2020-10-02 MED ORDER — DEXAMETHASONE SODIUM PHOSPHATE 10 MG/ML IJ SOLN
6.0000 mg | Freq: Four times a day (QID) | INTRAMUSCULAR | Status: AC
  Filled 2020-10-02: qty 1

## 2020-10-02 MED ORDER — HEMOSTATIC AGENTS (NO CHARGE) OPTIME
TOPICAL | Status: DC | PRN
  Administered 2020-10-02 (×2): 1 via TOPICAL

## 2020-10-02 MED ORDER — ACETAMINOPHEN 650 MG RE SUPP: 650.0000 mg | RECTAL | Status: DC | PRN

## 2020-10-02 MED ORDER — SODIUM CHLORIDE 0.9% IV SOLUTION: Freq: Once | INTRAVENOUS | Status: AC

## 2020-10-02 MED ORDER — SODIUM CHLORIDE 0.9% IV SOLUTION: Freq: Once | INTRAVENOUS | Status: DC

## 2020-10-02 MED ORDER — HYDROMORPHONE HCL 1 MG/ML IJ SOLN: 0.2500 mg | INTRAMUSCULAR | Status: DC | PRN

## 2020-10-02 MED ORDER — LIDOCAINE 2% (20 MG/ML) 5 ML SYRINGE
INTRAMUSCULAR | Status: DC | PRN
  Administered 2020-10-02: 80 mg via INTRAVENOUS

## 2020-10-02 MED ORDER — CEFAZOLIN SODIUM-DEXTROSE 2-4 GM/100ML-% IV SOLN
2.0000 g | Freq: Three times a day (TID) | INTRAVENOUS | Status: AC
Start: 2020-10-02 — End: 2020-10-03
  Administered 2020-10-02 – 2020-10-03 (×2): 2 g via INTRAVENOUS
  Filled 2020-10-02 (×2): qty 100

## 2020-10-02 MED ORDER — POTASSIUM CHLORIDE IN NACL 20-0.9 MEQ/L-% IV SOLN
INTRAVENOUS | Status: DC
  Filled 2020-10-02 (×2): qty 1000

## 2020-10-02 MED ORDER — CLEVIDIPINE BUTYRATE 0.5 MG/ML IV EMUL
INTRAVENOUS | Status: DC | PRN
  Administered 2020-10-02: 2 mg/h via INTRAVENOUS

## 2020-10-02 SURGICAL SUPPLY — 82 items
BAND RUBBER #18 3X1/16 STRL (MISCELLANEOUS) ×6 IMPLANT
BATTERY IQ STERILE (MISCELLANEOUS) ×3 IMPLANT
BIT DRILL WIRE PASS 1.3MM (BIT) IMPLANT
BLADE CLIPPER SPEC (BLADE) IMPLANT
BLADE ULTRA TIP 2M (BLADE) IMPLANT
BUR CARBIDE MATCH 3.0 (BURR) ×3 IMPLANT
BUR PRECISION FLUTE 6.0 (BURR) ×3 IMPLANT
BUR SPIRAL ROUTER 2.3 (BUR) IMPLANT
CANISTER SUCT 3000ML PPV (MISCELLANEOUS) ×6 IMPLANT
CARTRIDGE OIL MAESTRO DRILL (MISCELLANEOUS) ×2 IMPLANT
CATH VENTRIC 35X38 W/TROCAR LG (CATHETERS) IMPLANT
CLIP VESOCCLUDE MED 6/CT (CLIP) IMPLANT
CNTNR URN SCR LID CUP LEK RST (MISCELLANEOUS) ×2 IMPLANT
CONT SPEC 4OZ STRL OR WHT (MISCELLANEOUS) ×1
COVER BACK TABLE 60X90IN (DRAPES) IMPLANT
COVER WAND RF STERILE (DRAPES) ×3 IMPLANT
DIFFUSER DRILL AIR PNEUMATIC (MISCELLANEOUS) ×3 IMPLANT
DRAPE MICROSCOPE LEICA (MISCELLANEOUS) ×3 IMPLANT
DRAPE NEUROLOGICAL W/INCISE (DRAPES) ×3 IMPLANT
DRAPE SURG 17X23 STRL (DRAPES) IMPLANT
DRAPE WARM FLUID 44X44 (DRAPES) ×3 IMPLANT
DRILL WIRE PASS 1.3MM (BIT)
DRSG OPSITE POSTOP 3X4 (GAUZE/BANDAGES/DRESSINGS) ×3 IMPLANT
ELECT REM PT RETURN 9FT ADLT (ELECTROSURGICAL) ×3
ELECTRODE REM PT RTRN 9FT ADLT (ELECTROSURGICAL) ×2 IMPLANT
EVACUATOR 1/8 PVC DRAIN (DRAIN) IMPLANT
EVACUATOR SILICONE 100CC (DRAIN) IMPLANT
FORCEPS BIPOLAR SPETZLER 8 1.0 (NEUROSURGERY SUPPLIES) ×3 IMPLANT
GAUZE 4X4 16PLY RFD (DISPOSABLE) IMPLANT
GAUZE SPONGE 4X4 12PLY STRL (GAUZE/BANDAGES/DRESSINGS) IMPLANT
GLOVE BIO SURGEON STRL SZ8 (GLOVE) ×3 IMPLANT
GLOVE BIO SURGEON STRL SZ8.5 (GLOVE) ×3 IMPLANT
GLOVE BIOGEL PI ORTHO PRO SZ7 (GLOVE) ×1
GLOVE EXAM NITRILE XL STR (GLOVE) IMPLANT
GLOVE PI ORTHO PRO STRL SZ7 (GLOVE) ×2 IMPLANT
GLOVE SS BIOGEL STRL SZ 8.5 (GLOVE) ×2 IMPLANT
GLOVE SUPERSENSE BIOGEL SZ 8.5 (GLOVE) ×1
GLOVE SURG PR MICRO ENCORE 7 (GLOVE) ×6 IMPLANT
GLOVE SURG PR MICRO ENCORE 7.5 (GLOVE) ×9 IMPLANT
GLOVE SURG UNDER POLY LF SZ6.5 (GLOVE) ×12 IMPLANT
GOWN STRL REUS W/ TWL LRG LVL3 (GOWN DISPOSABLE) IMPLANT
GOWN STRL REUS W/ TWL XL LVL3 (GOWN DISPOSABLE) ×2 IMPLANT
GOWN STRL REUS W/TWL LRG LVL3 (GOWN DISPOSABLE)
GOWN STRL REUS W/TWL XL LVL3 (GOWN DISPOSABLE) ×1
HEMOSTAT POWDER SURGIFOAM 1G (HEMOSTASIS) ×3 IMPLANT
HEMOSTAT SURGICEL 2X14 (HEMOSTASIS) ×3 IMPLANT
KIT BASIN OR (CUSTOM PROCEDURE TRAY) ×3 IMPLANT
KIT DRAIN CSF ACCUDRAIN (MISCELLANEOUS) IMPLANT
KIT TURNOVER KIT B (KITS) ×3 IMPLANT
MARKER SKIN DUAL TIP RULER LAB (MISCELLANEOUS) IMPLANT
MARKER SPHERE PSV REFLC 13MM (MARKER) ×6 IMPLANT
NEEDLE HYPO 22GX1.5 SAFETY (NEEDLE) ×3 IMPLANT
NS IRRIG 1000ML POUR BTL (IV SOLUTION) ×3 IMPLANT
OIL CARTRIDGE MAESTRO DRILL (MISCELLANEOUS) ×3
PACK CRANIOTOMY CUSTOM (CUSTOM PROCEDURE TRAY) ×3 IMPLANT
PAD ARMBOARD 7.5X6 YLW CONV (MISCELLANEOUS) ×3 IMPLANT
PATTIES SURGICAL .25X.25 (GAUZE/BANDAGES/DRESSINGS) IMPLANT
PATTIES SURGICAL .5 X.5 (GAUZE/BANDAGES/DRESSINGS) IMPLANT
PATTIES SURGICAL .5 X3 (DISPOSABLE) IMPLANT
PATTIES SURGICAL 1X1 (DISPOSABLE) IMPLANT
PIN MAYFIELD SKULL DISP (PIN) IMPLANT
SPECIMEN JAR SMALL (MISCELLANEOUS) IMPLANT
SPONGE NEURO XRAY DETECT 1X3 (DISPOSABLE) IMPLANT
SPONGE SURGIFOAM ABS GEL 100 (HEMOSTASIS) ×3 IMPLANT
SPONGE SURGIFOAM ABS GEL SZ50 (HEMOSTASIS) ×3 IMPLANT
STAPLER SKIN PROX WIDE 3.9 (STAPLE) ×3 IMPLANT
STOCKINETTE 6  STRL (DRAPES)
STOCKINETTE 6 STRL (DRAPES) IMPLANT
SUT ETHILON 3 0 FSL (SUTURE) IMPLANT
SUT ETHILON 3 0 PS 1 (SUTURE) IMPLANT
SUT NURALON 4 0 TR CR/8 (SUTURE) ×6 IMPLANT
SUT PROLENE 6 0 BV (SUTURE) IMPLANT
SUT SILK 0 TIES 10X30 (SUTURE) IMPLANT
SUT VIC AB 2-0 CP2 18 (SUTURE) ×3 IMPLANT
SUT VIC AB 3-0 FS2 27 (SUTURE) IMPLANT
SUT VICRYL 4-0 PS2 18IN ABS (SUTURE) IMPLANT
TOWEL GREEN STERILE (TOWEL DISPOSABLE) ×3 IMPLANT
TOWEL GREEN STERILE FF (TOWEL DISPOSABLE) ×3 IMPLANT
TRAY FOLEY MTR SLVR 16FR STAT (SET/KITS/TRAYS/PACK) IMPLANT
TUBE CONNECTING 12X1/4 (SUCTIONS) ×3 IMPLANT
UNDERPAD 30X36 HEAVY ABSORB (UNDERPADS AND DIAPERS) IMPLANT
WATER STERILE IRR 1000ML POUR (IV SOLUTION) ×3 IMPLANT

## 2020-10-02 NOTE — Anesthesia Preprocedure Evaluation (Addendum)
Anesthesia Evaluation  Patient identified by MRN, date of birth, ID band Patient awake    Reviewed: Allergy & Precautions, H&P , NPO status , Patient's Chart, lab work & pertinent test results  Airway Mallampati: III  TM Distance: >3 FB Neck ROM: Full    Dental no notable dental hx. (+) Teeth Intact, Dental Advisory Given   Pulmonary neg pulmonary ROS,    Pulmonary exam normal breath sounds clear to auscultation       Cardiovascular negative cardio ROS   Rhythm:Regular Rate:Normal     Neuro/Psych Seizures -,  Intracranial mass negative psych ROS   GI/Hepatic negative GI ROS, Neg liver ROS,   Endo/Other  negative endocrine ROS  Renal/GU negative Renal ROS  negative genitourinary   Musculoskeletal   Abdominal   Peds  Hematology  (+) Blood dyscrasia, anemia ,   Anesthesia Other Findings   Reproductive/Obstetrics negative OB ROS                            Anesthesia Physical Anesthesia Plan  ASA: III  Anesthesia Plan: General   Post-op Pain Management:    Induction: Intravenous  PONV Risk Score and Plan: 4 or greater and Ondansetron, Dexamethasone and Midazolam  Airway Management Planned: Oral ETT  Additional Equipment: Arterial line  Intra-op Plan:   Post-operative Plan: Extubation in OR  Informed Consent: I have reviewed the patients History and Physical, chart, labs and discussed the procedure including the risks, benefits and alternatives for the proposed anesthesia with the patient or authorized representative who has indicated his/her understanding and acceptance.     Dental advisory given and Interpreter used for interveiw  Plan Discussed with: CRNA  Anesthesia Plan Comments:        Anesthesia Quick Evaluation

## 2020-10-02 NOTE — Progress Notes (Signed)
COURTESY NOTE: Consult was requested on this francophone [place] woman residing in [place] for brain lesions and uterine masses. However evaluation by Dr Arnoldo Morale suggested the brain lesions could be toxoplasmosis, the patient was found to be HIV positive, and results from craniotomy today 10/02/2020 are pending. Review of the uterine masses by GYN ONC suggests a fibroid uterus.  At this point it does not appear than oncology input is needed. I will follow-up on results of CNS biopsy and proceed to consult if malignancy is demonstrated.

## 2020-10-02 NOTE — Progress Notes (Signed)
Gynecologic Oncology  Recent imaging reviewed by Dr. Jeral Pinch, GYN Oncologist. Uterus with fibroids noted. It would be unusual to have a gynecologic malignancy metastasize to the brain without other evidence of disease in the pelvis and abdomen with the exception of GTN. Recent Hcg <5.0 on 09/29/20. LMS very rarely metastasizes to the brain. Overall, uterine findings are more likely to be benign in nature. GYN Oncology will see the patient if needed based on pathology from craniotomy today.

## 2020-10-02 NOTE — Progress Notes (Signed)
SLP Cancellation Note  Patient Details Name: Nola Botkins MRN: [id number] DOB: Apr 21, 1981   Cancelled treatment:       Reason Eval/Treat Not Completed: Patient at procedure or test/unavailable   Hayden Rasmussen MA, CCC-SLP Acute Rehabilitation Services   10/02/2020, 1:01 PM

## 2020-10-02 NOTE — Transfer of Care (Addendum)
Immediate Anesthesia Transfer of Care Note  Patient: Alezandra Egli  Procedure(s) Performed: CRANIOTOMY TUMOR EXCISION with BrainLab (Left ) APPLICATION OF CRANIAL NAVIGATION (Left )  Patient Location: PACU  Anesthesia Type:General  Level of Consciousness: drowsy and patient cooperative  Airway & Oxygen Therapy: Patient Spontanous Breathing and Patient connected to face mask  Post-op Assessment: Report given to RN and Post -op Vital signs reviewed and stable  Post vital signs: Reviewed and stable  Last Vitals:  Vitals Value Taken Time  BP    Temp    Pulse    Resp    SpO2      Last Pain:  Vitals:   10/02/20 0833  TempSrc: Oral  PainSc:       Patients Stated Pain Goal: 0 (50/75/73 2256)  Complications: No complications documented.

## 2020-10-02 NOTE — Op Note (Signed)
Brief history: The patient is a 40 year old HIV-positive black female immigrant from [place] who presented with a seizure and aphasia.  She was worked up with a head CT and brain MRI which demonstrated to left brain tumors.  ID was consulted and requested a brain biopsy.  I discussed the situation with the patient and her sister.  She has decided to proceed with surgery after weighing the risk, benefits and alternatives.  Preop diagnosis: Left brain tumors  Postop diagnosis: The same  Procedure: Left posterior temporal craniectomy for open brain biopsy and cultures with BrainLab neuro navigation  Surgeon: Dr. Earle Gell  Assistant: Arnetha Massy, NP  Anesthesia: General tracheal  Estimated blood loss: 50 cc  Specimens: Open brain biopsy to pathology and brain tissue to microbiology for fungal and AFB stains and cultures  Drains: None  Complications: None  Description of procedure: The patient's preoperative head CT and brain MRI were entered into the [facility] computer.  The patient was brought to the operating room by the anesthesia team.  General endotracheal anesthesia was induced.  I then applied the [place] three-point headrest to the patient's calvarium.  A roll was placed under her left shoulder.  The patient remained in the supine position with her head turned to the right exposing her left temporal scalp.  The patient's position was entered into the lab computer.  The patient's left temporal parietal scalp was then shaved with clippers and prepared with Betadine scrub and Betadine solution.  Sterile drapes were applied.  I then injected the area to be incised with Marcaine with epinephrine solution.  I incised the inferior left posterior temporal region the scalpel.  I exposed the underlying periosteum with the periosteal elevators.  We inserted the cerebellar retractor for exposure.  I then used a high-speed drill to create a left posterior temporal craniectomy.  I then  incised the underlying dura with a scalpel in a cruciate fashion.  We tacked up the dural edges.  We confirmed our good location with the BrainLab neuro navigation.  The brain appeared edematous.  I incised the exposed brain with a 15 blade scalpel and obtained multiple specimens with the cup forceps.  We encountered some obviously abnormal tissue as expected.  We sent 2 brain specimens, one to pathology for identification, and 1 to microbiology for AFB and fungal cultures and smears.  I then obtained hemostasis using bipolar electrocautery.  We irrigated out the wound.  I laid Surgicel over the exposed brain and the biopsy cavity.  I then remove the retractors.  We reapproximated the dura with 4 Nurolon suture.  We reapproximated the patient's galea with interrupted 2-0 Vicryl suture.  We reapproximated the skin with stainless steel staples.  The wound was then coated with bacitracin ointment.  A sterile dressing was applied.  The drapes were removed.  I then remove the [place] three-point headrest from the patient's calvarium.  By report all sponge, instrument, and needle counts were correct at the end this case.

## 2020-10-02 NOTE — Anesthesia Procedure Notes (Addendum)
Procedure Name: Intubation Date/Time: 10/02/2020 11:01 AM Performed by: Janene Harvey, CRNA Pre-anesthesia Checklist: Patient identified, Emergency Drugs available, Suction available, Patient being monitored and Timeout performed Patient Re-evaluated:Patient Re-evaluated prior to induction Oxygen Delivery Method: Circle system utilized Preoxygenation: Pre-oxygenation with 100% oxygen Induction Type: IV induction Ventilation: Mask ventilation without difficulty Laryngoscope Size: Mac and 3 Grade View: Grade I Tube type: Oral Number of attempts: 1 Airway Equipment and Method: Stylet Placement Confirmation: ETT inserted through vocal cords under direct vision and positive ETCO2 Secured at: 22 cm Dental Injury: Teeth and Oropharynx as per pre-operative assessment  Comments: Intubation by Jettie Pagan SRNA

## 2020-10-02 NOTE — Progress Notes (Signed)
PROGRESS NOTE    Candace Jones  EYC:[id number] DOB: Mar 03, 1981 DOA: 09/29/2020 PCP: Patient, No Pcp Per (Inactive)   Chief Complain: Aphasia, right-sided weakness  Brief Narrative: Patient is a 40 year old female from [place] with no significant past medical history was brought to the emergency department with complaints of aphasia, right facial droop, right-sided weakness.  She was also found to be confused and code stroke was called in the emergency department.  She was also complaining of mild headache since last few days, also complained of severe menorrhagia.  She also had a generalized tonic-clonic seizure in the emergency department.  CT head done in the emergency department showed possible left-sided brain mass with edema. 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   metastatic process.  Neurosurgery, neurology consulted. She was loaded with Keppra on presentation.  Lab work also showed hemoglobin of 6, transfused with PRBC. Plan for CT head with BrainLab protocol/MRI of the abdomen/pelvis and possible neurosurgical procedure today for biopsy/resection of the brain lesions  Assessment & Plan:   Principal Problem:   Intracranial mass Active Problems:   Seizure (HCC)   Microcytic hypochromic anemia   Brain mass   Ankle pain   AIDS (acquired immune deficiency syndrome) (HCC)   Uterine mass   Perirectal abscess   Intracranial mass:  CT head done in the emergency department showed possible left-sided brain mass with edema. 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    Neurosurgery, neurology consulted.  She was loaded with Keppra on presentation.  Also started on Decadron. Neurosurgery thinks that this lesions might be from toxoplasmosis if HIV is positive.  Lumbar puncture not possible due to possible increased intracranial  pressure/midline shift. Neurosurgery planning for biopsy, resection of the brain lesions.  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  Low  CD4 of less than 35.  Follow-up confirmatory assays.  Toxoplasma IgG  Positive,IgM negative. also checking QuantiFERON, histoplasma.reactive RPR,negative  cryptococcus.  ID following. If HIV is confirmed, we will also involve ophthalmology for funduscopic exam, she is complaining of blurry vision.  She also has a diffuse maculopapular skin lesions, but these lesions are healing, do not look active, patient complains of some itching.  Seizure: Had an episode of tonic-clonic seizure in the emergency department most likely acid with the brain mass/cystic lesion.  Neurology was following.  EEG showed cortical dysfunction in the left temporal region, moderate diffuse encephalopathy, no seizures or epileptiform discharges.  Continue Keppra 750 mg twice a day.  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   MRI abd/pelvis showed bulky enlargement of the uterus with multiple uterine masses, the largest of which measures 10 x 10 x 9.6 cm with suspicion for leiomyosarcoma.Also showed  paramidline pelvic floor/Peri coccygeal and perirectal cystic area measuring 5.3 x 3.3 cm with mildly thickened wall,possible  tail gut cyst.  We will discuss with GYN oncology about above finding.  Acute metabolic encephalopathy: Likely precipitated by seizure.  Currently much more alert and oriented   microcytic hypochromic anemia: No previous labs to compare.  Was having heavy menstrual cycles.  She was transfused 2 units of PRBC on presentation.  Monitor CBC.  Hemoglobin currently stable at 7.3.  Iron studies showed low  iron, given a dose of iron infusion.  Continue oral  supplementation.  Hypokalemia: Supplemented with potassium.  Nutrition Problem: Increased nutrient needs Etiology: acute illness      DVT prophylaxis:SCD Code Status: Full Family Communication: Sister at the bedside Status is: Inpatient  Remains inpatient appropriate because:Inpatient level of care appropriate due to severity of illness   Dispo: The patient is from: Home              Anticipated d/c is to: Home              Patient currently is not medically stable to d/c.   Difficult to place patient No     Consultants: Neurology, neurosurgery  Procedures:  Antimicrobials:  Anti-infectives (From admission, onward)   None      Subjective: Patient seen and examined the bedside this morning.  Hemodynamically stable.  She looks very comfortable today.  Alert and awake, and communicative.  Denies any complaints  Objective: Vitals:   10/01/20 1641 10/01/20 2016 10/02/20 0012 10/02/20 0305  BP: 139/83 122/73 (!) 152/93 (!) 151/82  Pulse: (!) 51 (!) 52 85 63  Resp:   20 20  Temp: 97.6 F (36.4 C) 97.6 F (36.4 C) 97.6 F (36.4 C) 97.9 F (36.6 C)  TempSrc: Oral Oral Oral Oral  SpO2: 100% 100% 97% 100%  Weight:        Intake/Output Summary (Last 24 hours) at 10/02/2020 0758 Last data filed at 10/02/2020 0313 Gross per 24 hour  Intake 806.5 ml  Output --  Net 806.5 ml   Filed Weights   09/29/20 1925  Weight: 89.8 kg    Examination:  General exam: Overall comfortable, not in distress HEENT: PERRL Respiratory system:  no wheezes or crackles  Cardiovascular system: S1 & S2 heard, RRR.  Gastrointestinal system: Abdomen is distended, soft and nontender.  Lower abdominal mass Central nervous system: Alert and oriented Extremities: No edema, no clubbing ,no cyanosis Skin: Scattered dry/healing maculopapular rash on the face, bilateral upper extremities and trunk, no ulcers,no icterus    Data Reviewed: I have personally reviewed following labs and imaging  studies  CBC: Recent Labs  Lab 09/29/20 1814 09/29/20 1822 09/30/20 0555 10/01/20 0744 10/02/20 0456  WBC 4.1  --  3.1* 4.9 4.4  NEUTROABS 1.8  --  2.0 3.1 3.4  HGB 6.6* 8.5* 7.1* 7.3* 7.1*  HCT 24.3* 25.0* 25.5* 25.7* 25.5*  MCV 61.5*  --  63.1* 62.5* 64.6*  PLT 213  --  183 219 546   Basic Metabolic Panel: Recent Labs  Lab 09/29/20 1814 09/29/20 1822 09/30/20 0555 10/01/20 0744 10/01/20 2222 10/02/20 0456  NA 133* 139 136 137  --  136  K 3.5 3.5 3.8 3.3*  --  4.4  CL 105 106 109 108  --  109  CO2 19*  --  20* 24  --  21*  GLUCOSE 114* 111* 104* 99  --  115*  BUN 10 10 6 11   --  12  CREATININE 0.57 0.50 0.48 0.58  --  0.49  CALCIUM 8.7*  --  8.5* 8.8*  --  8.8*  MG  --   --   --   --  2.2  --   PHOS  --   --   --   --  3.6  --    GFR: CrCl cannot be calculated (Unknown ideal weight.). Liver Function Tests: Recent Labs  Lab 09/29/20 1814 09/30/20 0555  AST 41 33  ALT  27 24  ALKPHOS 66 60  BILITOT 0.6 0.7  PROT 9.7* 9.0*  ALBUMIN 3.6 3.2*   No results for input(s): LIPASE, AMYLASE in the last 168 hours. No results for input(s): AMMONIA in the last 168 hours. Coagulation Profile: Recent Labs  Lab 09/29/20 1814  INR 1.1   Cardiac Enzymes: No results for input(s): CKTOTAL, CKMB, CKMBINDEX, TROPONINI in the last 168 hours. BNP (last 3 results) No results for input(s): PROBNP in the last 8760 hours. HbA1C: No results for input(s): HGBA1C in the last 72 hours. CBG: Recent Labs  Lab 09/30/20 0604 09/30/20 1227 09/30/20 1634 10/02/20 0007 10/02/20 0600  GLUCAP 110* 104* 121* 114* 122*   Lipid Profile: No results for input(s): CHOL, HDL, LDLCALC, TRIG, CHOLHDL, LDLDIRECT in the last 72 hours. Thyroid Function Tests: No results for input(s): TSH, T4TOTAL, FREET4, T3FREE, THYROIDAB in the last 72 hours. Anemia Panel: Recent Labs    09/30/20 1109  FERRITIN 6*  TIBC 498*  IRON 18*   Sepsis Labs: No results for input(s): PROCALCITON,  LATICACIDVEN in the last 168 hours.  Recent Results (from the past 240 hour(s))  Resp Panel by RT-PCR (Flu A&B, Covid) Nasopharyngeal Swab     Status: None   Collection Time: 09/29/20 10:45 PM   Specimen: Nasopharyngeal Swab; Nasopharyngeal(NP) swabs in vial transport medium  Result Value Ref Range Status   SARS Coronavirus 2 by RT PCR NEGATIVE NEGATIVE Final    Comment: (NOTE) SARS-CoV-2 target nucleic acids are NOT DETECTED.  The SARS-CoV-2 RNA is generally detectable in upper respiratory specimens during the acute phase of infection. The lowest concentration of SARS-CoV-2 viral copies this assay can detect is 138 copies/mL. A negative result does not preclude SARS-Cov-2 infection and should not be used as the sole basis for treatment or other patient management decisions. A negative result may occur with  improper specimen collection/handling, submission of specimen other than nasopharyngeal swab, presence of viral mutation(s) within the areas targeted by this assay, and inadequate number of viral copies(<138 copies/mL). A negative result must be combined with clinical observations, patient history, and epidemiological information. The expected result is Negative.  Fact Sheet for Patients:  EntrepreneurPulse.com.au  Fact Sheet for Healthcare Providers:  IncredibleEmployment.be  This test is no t yet approved or cleared by the [place] FDA and  has been authorized for detection and/or diagnosis of SARS-CoV-2 by FDA under an Emergency Use Authorization (EUA). This EUA will remain  in effect (meaning this test can be used) for the duration of the COVID-19 declaration under Section 564(b)(1) of the Act, 21 U.S.C.section 360bbb-3(b)(1), unless the authorization is terminated  or revoked sooner.       Influenza A by PCR NEGATIVE NEGATIVE Final   Influenza B by PCR NEGATIVE NEGATIVE Final    Comment: (NOTE) The Xpert Xpress  SARS-CoV-2/FLU/RSV plus assay is intended as an aid in the diagnosis of influenza from Nasopharyngeal swab specimens and should not be used as a sole basis for treatment. Nasal washings and aspirates are unacceptable for Xpert Xpress SARS-CoV-2/FLU/RSV testing.  Fact Sheet for Patients: EntrepreneurPulse.com.au  Fact Sheet for Healthcare Providers: IncredibleEmployment.be  This test is not yet approved or cleared by the [place] FDA and has been authorized for detection and/or diagnosis of SARS-CoV-2 by FDA under an Emergency Use Authorization (EUA). This EUA will remain in effect (meaning this test can be used) for the duration of the COVID-19 declaration under Section 564(b)(1) of the Act, 21 U.S.C. section 360bbb-3(b)(1), unless the authorization  is terminated or revoked.  Performed at [facility], [place] [address], [place], [place] [address]   MRSA PCR Screening     Status: None   Collection Time: 09/30/20  1:07 AM   Specimen: Nasal Mucosa; Nasopharyngeal  Result Value Ref Range Status   MRSA by PCR NEGATIVE NEGATIVE Final    Comment:        The GeneXpert MRSA Assay (FDA approved for NASAL specimens only), is one component of a comprehensive MRSA colonization surveillance program. It is not intended to diagnose MRSA infection nor to guide or monitor treatment for MRSA infections. Performed at [facility], [place] [address] [place], [place] [address]          Radiology Studies: DG Ankle 2 Views Right  Result Date: 09/30/2020 CLINICAL DATA:  Ankle pain EXAM: RIGHT ANKLE - 2 VIEW COMPARISON:  X-ray ankle 09/17/2019 FINDINGS: No evidence of fracture, dislocation, or joint effusion. Redemonstration of a sclerotic appearance of the navicular bone. Soft tissues are unremarkable. IMPRESSION: No acute displaced fracture or dislocation with redemonstration of a chronic sclerotic appearing navicular. As per previous x-ray,  noncontrast CT or MRI of the right focal may be valuable for further evaluation if clinically indicated. Electronically Signed   By: Iven Finn M.D.   On: 09/30/2020 21:01   CT HEAD WO CONTRAST  Result Date: 10/01/2020 CLINICAL DATA:  Brain mass EXAM: CT HEAD WITHOUT CONTRAST TECHNIQUE: Contiguous axial images were obtained from the base of the skull through the vertex without intravenous contrast. COMPARISON:  Brain MRI 09/30/2020 FINDINGS: Brain: Large areas of vasogenic edema in the left temporal lobe and left frontal lobe are unchanged. There is rightward midline shift 6 mm. No hydrocephalus. No acute hemorrhage. Vascular: No hyperdense vessel or unexpected calcification. Skull: Normal. Negative for fracture or focal lesion. Sinuses/Orbits: Right frontal sinus opacification Other: None. IMPRESSION: Unchanged appearance of large areas of vasogenic edema in the left temporal lobe and left frontal lobe with 6 mm rightward midline shift. Electronically Signed   By: Ulyses Jarred M.D.   On: 10/01/2020 23:09   EEG adult  Result Date: 09/30/2020 Lora Havens, MD     09/30/2020  2:07 PM Patient Name: Ronee Ranganathan MRN: [id number] Epilepsy Attending: Lora Havens Referring Physician/Provider: Dr. Gean Birchwood Date: 09/30/2020 Duration: 23.02 mins Patient history: 40 year old female presented with sudden onset of aphasia right facial droop and right-sided weakness.  CT head showed possible left-sided brain mass with edema.  EEG to evaluate for seizures. Level of alertness: Awake AEDs during EEG study: Keppra Technical aspects: This EEG study was done with scalp electrodes positioned according to the 10-20 International system of electrode placement. Electrical activity was acquired at a sampling rate of 500Hz  and reviewed with a high frequency filter of 70Hz  and a low frequency filter of 1Hz . EEG data were recorded continuously and digitally stored. Description:  The posterior dominant rhythm  consists of 9 Hz activity of moderate voltage (25-35 uV) seen predominantly in posterior head regions, symmetric and reactive to eye opening and eye closing. EEG also showed continuous rhythmic amplitude 2 to 3 Hz delta slowing in left temporal region. Additionally there is also continuous generalized polymorphic mixed frequencies with predominantly 5 to 8 Hz theta-alpha activity. Hyperventilation and photic stimulation were not performed.   ABNORMALITY - Lateralized rhythmic delta activity, left temporal region - Continuous slow, generalized IMPRESSION: This study is suggestive of cortical dysfunction in left temporal region likely secondary underlying structural  abnormality, ictal, postictal state.  Additionally there is evidence of moderate diffuse encephalopathy, nonspecific etiology.  No seizures or epileptiform discharges were seen throughout the recording. Priyanka Barbra Sarks    Overnight EEG with video  Result Date: 10/01/2020 Lora Havens, MD     10/01/2020 10:04 AM Patient Name: Terease Marcotte MRN: [id number] Epilepsy Attending: Lora Havens Referring Physician/Provider:  Myra Rude, NP Duration: 09/30/2020 1406 to 10/01/2020 1000  Patient history: 41 year old female presented with sudden onset of aphasia right facial droop and right-sided weakness.  CT head showed possible left-sided brain mass with edema.  EEG to evaluate for seizures.  Level of alertness: Awake, asleep  AEDs during EEG study: Keppra  Technical aspects: This EEG study was done with scalp electrodes positioned according to the 10-20 International system of electrode placement. Electrical activity was acquired at a sampling rate of 500Hz  and reviewed with a high frequency filter of 70Hz  and a low frequency filter of 1Hz . EEG data were recorded continuously and digitally stored.  Description:  The posterior dominant rhythm consists of 9 Hz activity of moderate voltage (25-35 uV) seen predominantly in posterior head  regions, symmetric and reactive to eye opening and eye closing.   Sleep was characterized by vertex waves, sleep spindles (12 to 14 Hz), maximal frontocentral region.  EEG also showed near continuous rhythmic amplitude 2 to 3 Hz delta slowing in left temporal region. Additionally there is also continuous generalized polymorphic mixed frequencies with predominantly 5 to 8 Hz theta-alpha activity. Hyperventilation and photic stimulation were not performed.    ABNORMALITY - Lateralized rhythmic delta activity, left temporal region - Continuous slow, generalized  IMPRESSION: This study is suggestive of cortical dysfunction in left temporal region likely secondary underlying structural abnormality, ictal, postictal state.  Additionally there is evidence of moderate diffuse encephalopathy, nonspecific etiology.  No seizures or epileptiform discharges were seen throughout the recording.  Priyanka Barbra Sarks        Scheduled Meds: . dexamethasone (DECADRON) injection  6 mg Intravenous Q6H  . feeding supplement  237 mL Oral BID BM  . ferrous sulfate  325 mg Oral Q breakfast  . nystatin  5 mL Oral QID  . sodium chloride flush  3 mL Intravenous Once   Continuous Infusions: . levETIRAcetam 750 mg (10/01/20 2158)     LOS: 2 days    Time spent: 35 mins,More than 50% of that time was spent in counseling and/or coordination of care.      Shelly Coss, MD [facility] P4/21/2022, 7:58 AM

## 2020-10-02 NOTE — Progress Notes (Signed)
Pt back from CT HR has been 39-44 since over night.

## 2020-10-02 NOTE — Progress Notes (Signed)
ND done @1325 

## 2020-10-02 NOTE — Anesthesia Procedure Notes (Addendum)
Arterial Line Insertion Start/End4/21/2022 10:35 AM Performed by: Janene Harvey, CRNA, CRNA  Patient location: Pre-op. Preanesthetic checklist: patient identified, IV checked, risks and benefits discussed, surgical consent, pre-op evaluation, timeout performed and anesthesia consent Lidocaine 1% used for infiltration Right, radial was placed Catheter size: 20 G Hand hygiene performed  and maximum sterile barriers used   Attempts: 1 Procedure performed without using ultrasound guided technique. Following insertion, dressing applied and Biopatch. Post procedure assessment: normal and unchanged  Patient tolerated the procedure well with no immediate complications. Additional procedure comments: Placed by Jettie Pagan SRNA.

## 2020-10-02 NOTE — Progress Notes (Signed)
Subjective: The patient is alert and pleasant.  Her sister is at the bedside.  Objective: Vital signs in last 24 hours: Temp:  [97.6 F (36.4 C)-98.3 F (36.8 C)] 98.3 F (36.8 C) (04/21 0833) Pulse Rate:  [42-85] 45 (04/21 0920) Resp:  [18-20] 18 (04/21 0833) BP: (122-153)/(73-93) 153/83 (04/21 0920) SpO2:  [97 %-100 %] 100 % (04/21 0833) Estimated body mass index is 30 kg/m as calculated from the following:   Height as of 04/04/19: 5' 8.11" (1.73 m).   Weight as of this encounter: 89.8 kg.   Intake/Output from previous day: 04/20 0701 - 04/21 0700 In: 806.5 [IV Piggyback:806.5] Out: -  Intake/Output this shift: No intake/output data recorded.  Physical exam the patient is alert and pleasant.  She is moving all 4 extremities.  She speaks [place].  Lab Results: Recent Labs    10/01/20 0744 10/02/20 0456  WBC 4.9 4.4  HGB 7.3* 7.1*  HCT 25.7* 25.5*  PLT 219 196   BMET Recent Labs    10/01/20 0744 10/02/20 0456  NA 137 136  K 3.3* 4.4  CL 108 109  CO2 24 21*  GLUCOSE 99 115*  BUN 11 12  CREATININE 0.58 0.49  CALCIUM 8.8* 8.8*    Studies/Results: DG Ankle 2 Views Right  Result Date: 09/30/2020 CLINICAL DATA:  Ankle pain EXAM: RIGHT ANKLE - 2 VIEW COMPARISON:  X-ray ankle 09/17/2019 FINDINGS: No evidence of fracture, dislocation, or joint effusion. Redemonstration of a sclerotic appearance of the navicular bone. Soft tissues are unremarkable. IMPRESSION: No acute displaced fracture or dislocation with redemonstration of a chronic sclerotic appearing navicular. As per previous x-ray, noncontrast CT or MRI of the right focal may be valuable for further evaluation if clinically indicated. Electronically Signed   By: Iven Finn M.D.   On: 09/30/2020 21:01   CT HEAD WO CONTRAST  Result Date: 10/02/2020 CLINICAL DATA:  Brain mass.  Preoperative planning. EXAM: CT HEAD WITHOUT CONTRAST TECHNIQUE: Contiguous axial images were obtained from the base of the skull  through the vertex without intravenous contrast. COMPARISON:  MRI head 09/30/2020.  CT head 10/01/2020 FINDINGS: Brain: BrainLAB protocol for preoperative planning purposes. Large areas of edema in the left frontal white matter and left temporoparietal white matter appear unchanged from recent studies. Prior MRI demonstrated associated enhancing masses. Masses are difficult to see on unenhanced CT. There is mass-effect on the left cerebral hemisphere. 5 mm midline shift to the right similar to slightly improved from prior studies. No hydrocephalus identified.  No acute hemorrhage identified. Vascular: Negative for hyperdense vessel Skull: No focal lesion. Sinuses/Orbits: Mucosal edema paranasal sinuses. Bony thickening of the maxillary sinus bilaterally. Negative orbit Other: None IMPRESSION: Large areas of edema in the left frontal lobe and left temporoparietal lobe unchanged. Associated areas of enhancement noted on MRI. 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 No acute hemorrhage. 5 mm midline shift to the right similar to slightly improved. Electronically Signed   By: Franchot Gallo M.D.   On: 10/02/2020 09:41   CT HEAD WO CONTRAST  Result Date: 10/01/2020 CLINICAL DATA:  Brain mass EXAM: CT HEAD WITHOUT CONTRAST TECHNIQUE: Contiguous axial images were obtained from the base of the skull through the vertex without intravenous contrast. COMPARISON:  Brain MRI 09/30/2020 FINDINGS: Brain: Large areas of vasogenic edema in the left temporal lobe and left frontal lobe are unchanged. There is rightward midline shift 6 mm. No hydrocephalus. No acute hemorrhage. Vascular: No hyperdense vessel or unexpected  calcification. Skull: Normal. Negative for fracture or focal lesion. Sinuses/Orbits: Right frontal sinus opacification Other: None. IMPRESSION: Unchanged appearance of large areas of vasogenic edema in the left temporal lobe and left frontal lobe with 6 mm rightward midline  shift. Electronically Signed   By: Ulyses Jarred M.D.   On: 10/01/2020 23:09   MR PELVIS W WO CONTRAST  Result Date: 10/02/2020 CLINICAL DATA:  Cancer of unknown primary. EXAM: MRI ABDOMEN AND PELVIS WITHOUT AND WITH CONTRAST TECHNIQUE: Multiplanar multisequence MR imaging of the abdomen and pelvis was performed both before and after the administration of intravenous contrast. CONTRAST:  75mL GADAVIST GADOBUTROL 1 MMOL/ML IV SOLN COMPARISON:  CT chest, abdomen and pelvis of September 29, 2020 FINDINGS: COMBINED FINDINGS FOR BOTH MR ABDOMEN AND PELVIS Lower chest: Please see dedicated CT of the chest from April 18th. No effusion or consolidation. Limited assessment of the lung bases on MRI. Heart size is enlarged. Hepatobiliary: Marked decreased T2 signal within the hepatic parenchyma in this patient who received a dose of Feraheme on 09/30/2020 which limits the evaluation in general. No visible lesion, biliary ductal dilation or pericholecystic stranding. Pancreas: Pancreas grossly normal without ductal dilation or sign of adjacent inflammation. Spleen: Spleen with marked reduction in signal on T2 weighted images, normal size. Adrenals/Urinary Tract: Adrenal glands are unremarkable. Mild fullness of collecting systems and ureters is similar to the previous CT evaluation with mild RIGHT hydronephrosis perhaps related to compression of the distal ureters secondary to mass in the pelvis. Smooth renal contours. No visible renal lesion. Stomach/Bowel: Limited assessment of the gastrointestinal tract on MRI confounded by presence of susceptibility artifact from gas and gross patient and respiratory motion. Vascular/Lymphatic: Vascular structures in the abdomen are patent. The IVC is engorged in the setting of RIGHT heart enlargement. No aneurysmal dilation of the abdominal aorta. No adenopathy visible in the abdomen or in the pelvis. Limited assessment due to motion artifact and pre-existing vascular signal related to  [facility] administration. No pelvic adenopathy. Reproductive: Bulky enlargement of the uterus. Numerous uterine masses. Endometrium not assessed. Adjacent urinary bladder is collapsed due to mass effect. Distal RIGHT ureter is mildly dilated as is the distal LEFT ureter due to mass effect. Uterus measuring 16 x 9 x 11 cm. Heterogeneous mass in the lower uterine segment is the largest mass in the uterus measuring 10 x 10 x 9.6 cm. Other small masses with predominantly low T2 signal and variable enhancement are noted. The dominant uterine mass displays enhancement from baseline signal with heterogeneous pattern. Examination limited by motion. Other: RIGHT paramidline pelvic floor/Peri coccygeal and perirectal cystic area measuring 5.3 x 3.3 cm with mildly thickened wall. Baseline T1 signal, hypointense. Baseline T2 signal with thin rim of low signal and evidence of septation. It is incompletely imaged at its lower margin on post-contrast images in appears to be below the pubococcygeal muscle abutting the gluteal muscle and the sacrum and coccyx as well as the rectum, perhaps insinuated between pelvic floor musculature. No ascites. Musculoskeletal: Marked diminished T1 and T2 signal within visualized skeletal structures compatible with iron deposition in marrow spaces in this patient with recent exogenous iron administration. No visible lesion. IMPRESSION: 1. Bulky enlargement of the uterus with multiple uterine masses, the largest of which measures 10 x 10 x 9.6 cm. Given the size of the dominant lesion and the heterogeneity leiomyosarcoma is a differential consideration though no definitive findings of leiomyosarcoma are present. 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 2. RIGHT paramidline pelvic floor/Peri  coccygeal and perirectal cystic area measuring 5.3 x 3.3 cm with mildly thickened wall. It is incompletely imaged at its lower margin on post-contrast images and appears to be below the  pubococcygeal muscle and perhaps insinuated between pelvic floor musculature. This may represent a tail gut cyst. The possibility of complication by infection is considered given the mildly thickened wall with enhancement. No nodularity. 3. Mild fullness of the collecting systems and ureters is similar to the previous CT evaluation with mild RIGHT hydronephrosis perhaps related to compression of the distal ureters secondary to mass effect. 4. 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 This does limit assessment. 5. Cardiomegaly. Electronically Signed   By: Zetta Bills M.D.   On: 10/02/2020 08:26   MR ABDOMEN W WO CONTRAST  Result Date: 10/02/2020 CLINICAL DATA:  Cancer of unknown primary. EXAM: MRI ABDOMEN AND PELVIS WITHOUT AND WITH CONTRAST TECHNIQUE: Multiplanar multisequence MR imaging of the abdomen and pelvis was performed both before and after the administration of intravenous contrast. CONTRAST:  56mL GADAVIST GADOBUTROL 1 MMOL/ML IV SOLN COMPARISON:  CT chest, abdomen and pelvis of September 29, 2020 FINDINGS: COMBINED FINDINGS FOR BOTH MR ABDOMEN AND PELVIS Lower chest: Please see dedicated CT of the chest from April 18th. No effusion or consolidation. Limited assessment of the lung bases on MRI. Heart size is enlarged. Hepatobiliary: Marked decreased T2 signal within the hepatic parenchyma in this patient who received a dose of Feraheme on 09/30/2020 which limits the evaluation in general. No visible lesion, biliary ductal dilation or pericholecystic stranding. Pancreas: Pancreas grossly normal without ductal dilation or sign of adjacent inflammation. Spleen: Spleen with marked reduction in signal on T2 weighted images, normal size. Adrenals/Urinary Tract: Adrenal glands are unremarkable. Mild fullness of collecting systems and ureters is similar to the previous CT evaluation with mild RIGHT hydronephrosis  perhaps related to compression of the distal ureters secondary to mass in the pelvis. Smooth renal contours. No visible renal lesion. Stomach/Bowel: Limited assessment of the gastrointestinal tract on MRI confounded by presence of susceptibility artifact from gas and gross patient and respiratory motion. Vascular/Lymphatic: Vascular structures in the abdomen are patent. The IVC is engorged in the setting of RIGHT heart enlargement. No aneurysmal dilation of the abdominal aorta. No adenopathy visible in the abdomen or in the pelvis. Limited assessment due to motion artifact and pre-existing vascular signal related to [employer] administration. No pelvic adenopathy. Reproductive: Bulky enlargement of the uterus. Numerous uterine masses. Endometrium not assessed. Adjacent urinary bladder is collapsed due to mass effect. Distal RIGHT ureter is mildly dilated as is the distal LEFT ureter due to mass effect. Uterus measuring 16 x 9 x 11 cm. Heterogeneous mass in the lower uterine segment is the largest mass in the uterus measuring 10 x 10 x 9.6 cm. Other small masses with predominantly low T2 signal and variable enhancement are noted. The dominant uterine mass displays enhancement from baseline signal with heterogeneous pattern. Examination limited by motion. Other: RIGHT paramidline pelvic floor/Peri coccygeal and perirectal cystic area measuring 5.3 x 3.3 cm with mildly thickened wall. Baseline T1 signal, hypointense. Baseline T2 signal with thin rim of low signal and evidence of septation. It is incompletely imaged at its lower margin on post-contrast images in appears to be below the pubococcygeal muscle abutting the gluteal muscle and the sacrum and coccyx as well as the rectum, perhaps insinuated between pelvic floor musculature. No ascites. Musculoskeletal: Marked diminished T1 and  T2 signal within visualized skeletal structures compatible with iron deposition in marrow spaces in this patient with recent exogenous  iron administration. No visible lesion. IMPRESSION: 1. Bulky enlargement of the uterus with multiple uterine masses, the largest of which measures 10 x 10 x 9.6 cm. Given the size of the dominant lesion and the heterogeneity leiomyosarcoma is a differential consideration though no definitive findings of leiomyosarcoma are present. 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 2. RIGHT paramidline pelvic floor/Peri coccygeal and perirectal cystic area measuring 5.3 x 3.3 cm with mildly thickened wall. It is incompletely imaged at its lower margin on post-contrast images and appears to be below the pubococcygeal muscle and perhaps insinuated between pelvic floor musculature. This may represent a tail gut cyst. The possibility of complication by infection is considered given the mildly thickened wall with enhancement. No nodularity. 3. Mild fullness of the collecting systems and ureters is similar to the previous CT evaluation with mild RIGHT hydronephrosis perhaps related to compression of the distal ureters secondary to mass effect. 4. 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 This does limit assessment. 5. Cardiomegaly. Electronically Signed   By: Zetta Bills M.D.   On: 10/02/2020 08:26   EEG adult  Result Date: 09/30/2020 Lora Havens, MD     09/30/2020  2:07 PM Patient Name: Candace Jones MRN: [id number] Epilepsy Attending: Lora Havens Referring Physician/Provider: Dr. Gean Birchwood Date: 09/30/2020 Duration: 23.02 mins Patient history: 40 year old female presented with sudden onset of aphasia right facial droop and right-sided weakness.  CT head showed possible left-sided brain mass with edema.  EEG to evaluate for seizures. Level of alertness: Awake AEDs during EEG study: Keppra Technical aspects: This EEG study was done with scalp electrodes positioned according to the 10-20  International system of electrode placement. Electrical activity was acquired at a sampling rate of 500Hz  and reviewed with a high frequency filter of 70Hz  and a low frequency filter of 1Hz . EEG data were recorded continuously and digitally stored. Description:  The posterior dominant rhythm consists of 9 Hz activity of moderate voltage (25-35 uV) seen predominantly in posterior head regions, symmetric and reactive to eye opening and eye closing. EEG also showed continuous rhythmic amplitude 2 to 3 Hz delta slowing in left temporal region. Additionally there is also continuous generalized polymorphic mixed frequencies with predominantly 5 to 8 Hz theta-alpha activity. Hyperventilation and photic stimulation were not performed.   ABNORMALITY - Lateralized rhythmic delta activity, left temporal region - Continuous slow, generalized IMPRESSION: This study is suggestive of cortical dysfunction in left temporal region likely secondary underlying structural abnormality, ictal, postictal state.  Additionally there is evidence of moderate diffuse encephalopathy, nonspecific etiology.  No seizures or epileptiform discharges were seen throughout the recording. Priyanka Barbra Sarks    Overnight EEG with video  Result Date: 10/01/2020 Lora Havens, MD     10/02/2020  8:47 AM Patient Name: Candace Jones MRN: [id number] Epilepsy Attending: Lora Havens Referring Physician/Provider:  Myra Rude, NP Duration: 09/30/2020 1406 to 10/01/2020 1448  Patient history: 40 year old female presented with sudden onset of aphasia right facial droop and right-sided weakness.  CT head showed possible left-sided brain mass with edema.  EEG to evaluate for seizures.  Level of alertness: Awake, asleep  AEDs during EEG study: Keppra  Technical aspects: This EEG study was done with scalp electrodes positioned according to the 10-20 International system of electrode placement. Dealer  activity was acquired at a sampling rate of  500Hz  and reviewed with a high frequency filter of 70Hz  and a low frequency filter of 1Hz . EEG data were recorded continuously and digitally stored.  Description:  The posterior dominant rhythm consists of 9 Hz activity of moderate voltage (25-35 uV) seen predominantly in posterior head regions, symmetric and reactive to eye opening and eye closing.   Sleep was characterized by vertex waves, sleep spindles (12 to 14 Hz), maximal frontocentral region.  EEG also showed near continuous rhythmic amplitude 2 to 3 Hz delta slowing in left temporal region. Additionally there is also continuous generalized polymorphic mixed frequencies with predominantly 5 to 8 Hz theta-alpha activity. Hyperventilation and photic stimulation were not performed.    ABNORMALITY - Lateralized rhythmic delta activity, left temporal region - Continuous slow, generalized  IMPRESSION: This study is suggestive of cortical dysfunction in left temporal region likely secondary underlying structural abnormality, ictal, postictal state.  Additionally there is evidence of moderate diffuse encephalopathy, nonspecific etiology.  No seizures or epileptiform discharges were seen throughout the recording.  Lora Havens    Assessment/Plan: Left brain tumors: I have discussed the situation with the patient via her sister and the video interpreter.  I have answered all her questions.  She wants to proceed with a left craniotomy for tumor resection/biopsy.  LOS: 2 days     Ophelia Charter 10/02/2020, 10:38 AM

## 2020-10-02 NOTE — Progress Notes (Signed)
Subjective:  No new complaints   Antibiotics:  Anti-infectives (From admission, onward)   Start     Dose/Rate Route Frequency Ordered Stop   10/02/20 1038  ceFAZolin (ANCEF) 2-4 GM/100ML-% IVPB       Note to Pharmacy: Laurita Quint   : cabinet override      10/02/20 1038 10/02/20 2244      Medications: Scheduled Meds: . sodium chloride   Intravenous Once  . [MAR Hold] dexamethasone (DECADRON) injection  6 mg Intravenous Q6H  . [MAR Hold] feeding supplement  237 mL Oral BID BM  . [MAR Hold] ferrous sulfate  325 mg Oral Q breakfast  . [MAR Hold] nystatin  5 mL Oral QID  . [MAR Hold] sodium chloride flush  3 mL Intravenous Once   Continuous Infusions: . sodium chloride    . ceFAZolin    . [MAR Hold] levETIRAcetam 750 mg (10/01/20 2158)   PRN Meds:.[MAR Hold] acetaminophen **OR** [MAR Hold] acetaminophen, [MAR Hold] LORazepam    Objective: Weight change:   Intake/Output Summary (Last 24 hours) at 10/02/2020 1339 Last data filed at 10/02/2020 1306 Gross per 24 hour  Intake 1850 ml  Output 300 ml  Net 1550 ml   Blood pressure 131/90, pulse (!) 54, temperature (!) 97 F (36.1 C), resp. rate (!) 24, weight 89.8 kg, SpO2 100 %. Temp:  [97 F (36.1 C)-98.3 F (36.8 C)] 97 F (36.1 C) (04/21 1310) Pulse Rate:  [42-85] 54 (04/21 1310) Resp:  [18-24] 24 (04/21 1310) BP: (122-153)/(73-93) 131/90 (04/21 1310) SpO2:  [97 %-100 %] 100 % (04/21 1310) Arterial Line BP: (156)/(82) 156/82 (04/21 1310)  Physical Exam: Physical Exam Constitutional:      Appearance: She is underweight.  HENT:     Head: Normocephalic and atraumatic.     Nose: Nose normal.  Eyes:     Extraocular Movements: Extraocular movements intact.  Cardiovascular:     Rate and Rhythm: Normal rate.  Pulmonary:     Effort: Pulmonary effort is normal. No respiratory distress.     Breath sounds: No wheezing.  Abdominal:     General: Bowel sounds are normal. There is distension.     Tenderness:  There is abdominal tenderness.  Skin:    General: Skin is warm.     Findings: Rash present.  Psychiatric:        Attention and Perception: Attention normal.        Speech: Speech is delayed.        Behavior: Behavior normal.        Thought Content: Thought content normal.        Cognition and Memory: Cognition and memory normal.        Judgment: Judgment normal.      CBC:    BMET Recent Labs    10/01/20 0744 10/02/20 0456 10/02/20 1117  NA 137 136 140  K 3.3* 4.4 3.9  CL 108 109  --   CO2 24 21*  --   GLUCOSE 99 115*  --   BUN 11 12  --   CREATININE 0.58 0.49  --   CALCIUM 8.8* 8.8*  --      Liver Panel  Recent Labs    09/29/20 1814 09/30/20 0555  PROT 9.7* 9.0*  ALBUMIN 3.6 3.2*  AST 41 33  ALT 27 24  ALKPHOS 66 60  BILITOT 0.6 0.7       Sedimentation Rate No results for input(s): ESRSEDRATE in  the last 72 hours. C-Reactive Protein No results for input(s): CRP in the last 72 hours.  Micro Results: Recent Results (from the past 720 hour(s))  Resp Panel by RT-PCR (Flu A&B, Covid) Nasopharyngeal Swab     Status: None   Collection Time: 09/29/20 10:45 PM   Specimen: Nasopharyngeal Swab; Nasopharyngeal(NP) swabs in vial transport medium  Result Value Ref Range Status   SARS Coronavirus 2 by RT PCR NEGATIVE NEGATIVE Final    Comment: (NOTE) SARS-CoV-2 target nucleic acids are NOT DETECTED.  The SARS-CoV-2 RNA is generally detectable in upper respiratory specimens during the acute phase of infection. The lowest concentration of SARS-CoV-2 viral copies this assay can detect is 138 copies/mL. A negative result does not preclude SARS-Cov-2 infection and should not be used as the sole basis for treatment or other patient management decisions. A negative result may occur with  improper specimen collection/handling, submission of specimen other than nasopharyngeal swab, presence of viral mutation(s) within the areas targeted by this assay, and inadequate  number of viral copies(<138 copies/mL). A negative result must be combined with clinical observations, patient history, and epidemiological information. The expected result is Negative.  Fact Sheet for Patients:  EntrepreneurPulse.com.au  Fact Sheet for Healthcare Providers:  IncredibleEmployment.be  This test is no t yet approved or cleared by the [place] FDA and  has been authorized for detection and/or diagnosis of SARS-CoV-2 by FDA under an Emergency Use Authorization (EUA). This EUA will remain  in effect (meaning this test can be used) for the duration of the COVID-19 declaration under Section 564(b)(1) of the Act, 21 U.S.C.section 360bbb-3(b)(1), unless the authorization is terminated  or revoked sooner.       Influenza A by PCR NEGATIVE NEGATIVE Final   Influenza B by PCR NEGATIVE NEGATIVE Final    Comment: (NOTE) The Xpert Xpress SARS-CoV-2/FLU/RSV plus assay is intended as an aid in the diagnosis of influenza from Nasopharyngeal swab specimens and should not be used as a sole basis for treatment. Nasal washings and aspirates are unacceptable for Xpert Xpress SARS-CoV-2/FLU/RSV testing.  Fact Sheet for Patients: EntrepreneurPulse.com.au  Fact Sheet for Healthcare Providers: IncredibleEmployment.be  This test is not yet approved or cleared by the [place] FDA and has been authorized for detection and/or diagnosis of SARS-CoV-2 by FDA under an Emergency Use Authorization (EUA). This EUA will remain in effect (meaning this test can be used) for the duration of the COVID-19 declaration under Section 564(b)(1) of the Act, 21 U.S.C. section 360bbb-3(b)(1), unless the authorization is terminated or revoked.  Performed at [facility], [place] [address], [place], [place] [address]   MRSA PCR Screening     Status: None   Collection Time: 09/30/20  1:07 AM   Specimen: Nasal Mucosa;  Nasopharyngeal  Result Value Ref Range Status   MRSA by PCR NEGATIVE NEGATIVE Final    Comment:        The GeneXpert MRSA Assay (FDA approved for NASAL specimens only), is one component of a comprehensive MRSA colonization surveillance program. It is not intended to diagnose MRSA infection nor to guide or monitor treatment for MRSA infections. Performed at [facility], [place] [address], [place], [place] [address]     Studies/Results: DG Ankle 2 Views Right  Result Date: 09/30/2020 CLINICAL DATA:  Ankle pain EXAM: RIGHT ANKLE - 2 VIEW COMPARISON:  X-ray ankle 09/17/2019 FINDINGS: No evidence of fracture, dislocation, or joint effusion. Redemonstration of a sclerotic appearance of the navicular bone. Soft  tissues are unremarkable. IMPRESSION: No acute displaced fracture or dislocation with redemonstration of a chronic sclerotic appearing navicular. As per previous x-ray, noncontrast CT or MRI of the right focal may be valuable for further evaluation if clinically indicated. Electronically Signed   By: Iven Finn M.D.   On: 09/30/2020 21:01   CT HEAD WO CONTRAST  Result Date: 10/02/2020 CLINICAL DATA:  Brain mass.  Preoperative planning. EXAM: CT HEAD WITHOUT CONTRAST TECHNIQUE: Contiguous axial images were obtained from the base of the skull through the vertex without intravenous contrast. COMPARISON:  MRI head 09/30/2020.  CT head 10/01/2020 FINDINGS: Brain: BrainLAB protocol for preoperative planning purposes. Large areas of edema in the left frontal white matter and left temporoparietal white matter appear unchanged from recent studies. Prior MRI demonstrated associated enhancing masses. Masses are difficult to see on unenhanced CT. There is mass-effect on the left cerebral hemisphere. 5 mm midline shift to the right similar to slightly improved from prior studies. No hydrocephalus identified.  No acute hemorrhage identified. Vascular: Negative for hyperdense vessel Skull: No focal  lesion. Sinuses/Orbits: Mucosal edema paranasal sinuses. Bony thickening of the maxillary sinus bilaterally. Negative orbit Other: None IMPRESSION: Large areas of edema in the left frontal lobe and left temporoparietal lobe unchanged. Associated areas of enhancement noted on MRI. 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 No acute hemorrhage. 5 mm midline shift to the right similar to slightly improved. Electronically Signed   By: Franchot Gallo M.D.   On: 10/02/2020 09:41   CT HEAD WO CONTRAST  Result Date: 10/01/2020 CLINICAL DATA:  Brain mass EXAM: CT HEAD WITHOUT CONTRAST TECHNIQUE: Contiguous axial images were obtained from the base of the skull through the vertex without intravenous contrast. COMPARISON:  Brain MRI 09/30/2020 FINDINGS: Brain: Large areas of vasogenic edema in the left temporal lobe and left frontal lobe are unchanged. There is rightward midline shift 6 mm. No hydrocephalus. No acute hemorrhage. Vascular: No hyperdense vessel or unexpected calcification. Skull: Normal. Negative for fracture or focal lesion. Sinuses/Orbits: Right frontal sinus opacification Other: None. IMPRESSION: Unchanged appearance of large areas of vasogenic edema in the left temporal lobe and left frontal lobe with 6 mm rightward midline shift. Electronically Signed   By: Ulyses Jarred M.D.   On: 10/01/2020 23:09   MR PELVIS W WO CONTRAST  Result Date: 10/02/2020 CLINICAL DATA:  Cancer of unknown primary. EXAM: MRI ABDOMEN AND PELVIS WITHOUT AND WITH CONTRAST TECHNIQUE: Multiplanar multisequence MR imaging of the abdomen and pelvis was performed both before and after the administration of intravenous contrast. CONTRAST:  83mL GADAVIST GADOBUTROL 1 MMOL/ML IV SOLN COMPARISON:  CT chest, abdomen and pelvis of September 29, 2020 FINDINGS: COMBINED FINDINGS FOR BOTH MR ABDOMEN AND PELVIS Lower chest: Please see dedicated CT of the chest from April 18th. No effusion or consolidation.  Limited assessment of the lung bases on MRI. Heart size is enlarged. Hepatobiliary: Marked decreased T2 signal within the hepatic parenchyma in this patient who received a dose of Feraheme on 09/30/2020 which limits the evaluation in general. No visible lesion, biliary ductal dilation or pericholecystic stranding. Pancreas: Pancreas grossly normal without ductal dilation or sign of adjacent inflammation. Spleen: Spleen with marked reduction in signal on T2 weighted images, normal size. Adrenals/Urinary Tract: Adrenal glands are unremarkable. Mild fullness of collecting systems and ureters is similar to the previous CT evaluation with mild RIGHT hydronephrosis perhaps related to compression of the distal ureters secondary to mass in the pelvis. Smooth renal  contours. No visible renal lesion. Stomach/Bowel: Limited assessment of the gastrointestinal tract on MRI confounded by presence of susceptibility artifact from gas and gross patient and respiratory motion. Vascular/Lymphatic: Vascular structures in the abdomen are patent. The IVC is engorged in the setting of RIGHT heart enlargement. No aneurysmal dilation of the abdominal aorta. No adenopathy visible in the abdomen or in the pelvis. Limited assessment due to motion artifact and pre-existing vascular signal related to [facility] administration. No pelvic adenopathy. Reproductive: Bulky enlargement of the uterus. Numerous uterine masses. Endometrium not assessed. Adjacent urinary bladder is collapsed due to mass effect. Distal RIGHT ureter is mildly dilated as is the distal LEFT ureter due to mass effect. Uterus measuring 16 x 9 x 11 cm. Heterogeneous mass in the lower uterine segment is the largest mass in the uterus measuring 10 x 10 x 9.6 cm. Other small masses with predominantly low T2 signal and variable enhancement are noted. The dominant uterine mass displays enhancement from baseline signal with heterogeneous pattern. Examination limited by motion. Other:  RIGHT paramidline pelvic floor/Peri coccygeal and perirectal cystic area measuring 5.3 x 3.3 cm with mildly thickened wall. Baseline T1 signal, hypointense. Baseline T2 signal with thin rim of low signal and evidence of septation. It is incompletely imaged at its lower margin on post-contrast images in appears to be below the pubococcygeal muscle abutting the gluteal muscle and the sacrum and coccyx as well as the rectum, perhaps insinuated between pelvic floor musculature. No ascites. Musculoskeletal: Marked diminished T1 and T2 signal within visualized skeletal structures compatible with iron deposition in marrow spaces in this patient with recent exogenous iron administration. No visible lesion. IMPRESSION: 1. Bulky enlargement of the uterus with multiple uterine masses, the largest of which measures 10 x 10 x 9.6 cm. Given the size of the dominant lesion and the heterogeneity leiomyosarcoma is a differential consideration though no definitive findings of leiomyosarcoma are present. 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 2. RIGHT paramidline pelvic floor/Peri coccygeal and perirectal cystic area measuring 5.3 x 3.3 cm with mildly thickened wall. It is incompletely imaged at its lower margin on post-contrast images and appears to be below the pubococcygeal muscle and perhaps insinuated between pelvic floor musculature. This may represent a tail gut cyst. The possibility of complication by infection is considered given the mildly thickened wall with enhancement. No nodularity. 3. Mild fullness of the collecting systems and ureters is similar to the previous CT evaluation with mild RIGHT hydronephrosis perhaps related to compression of the distal ureters secondary to mass effect. 4. 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 This does limit assessment. 5. Cardiomegaly. Electronically Signed   By:  Zetta Bills M.D.   On: 10/02/2020 08:26   MR ABDOMEN W WO CONTRAST  Result Date: 10/02/2020 CLINICAL DATA:  Cancer of unknown primary. EXAM: MRI ABDOMEN AND PELVIS WITHOUT AND WITH CONTRAST TECHNIQUE: Multiplanar multisequence MR imaging of the abdomen and pelvis was performed both before and after the administration of intravenous contrast. CONTRAST:  58mL GADAVIST GADOBUTROL 1 MMOL/ML IV SOLN COMPARISON:  CT chest, abdomen and pelvis of September 29, 2020 FINDINGS: COMBINED FINDINGS FOR BOTH MR ABDOMEN AND PELVIS Lower chest: Please see dedicated CT of the chest from April 18th. No effusion or consolidation. Limited assessment of the lung bases on MRI. Heart size is enlarged. Hepatobiliary: Marked decreased T2 signal within the hepatic parenchyma in this patient who received a dose of Feraheme on  09/30/2020 which limits the evaluation in general. No visible lesion, biliary ductal dilation or pericholecystic stranding. Pancreas: Pancreas grossly normal without ductal dilation or sign of adjacent inflammation. Spleen: Spleen with marked reduction in signal on T2 weighted images, normal size. Adrenals/Urinary Tract: Adrenal glands are unremarkable. Mild fullness of collecting systems and ureters is similar to the previous CT evaluation with mild RIGHT hydronephrosis perhaps related to compression of the distal ureters secondary to mass in the pelvis. Smooth renal contours. No visible renal lesion. Stomach/Bowel: Limited assessment of the gastrointestinal tract on MRI confounded by presence of susceptibility artifact from gas and gross patient and respiratory motion. Vascular/Lymphatic: Vascular structures in the abdomen are patent. The IVC is engorged in the setting of RIGHT heart enlargement. No aneurysmal dilation of the abdominal aorta. No adenopathy visible in the abdomen or in the pelvis. Limited assessment due to motion artifact and pre-existing vascular signal related to [facility] administration. No pelvic  adenopathy. Reproductive: Bulky enlargement of the uterus. Numerous uterine masses. Endometrium not assessed. Adjacent urinary bladder is collapsed due to mass effect. Distal RIGHT ureter is mildly dilated as is the distal LEFT ureter due to mass effect. Uterus measuring 16 x 9 x 11 cm. Heterogeneous mass in the lower uterine segment is the largest mass in the uterus measuring 10 x 10 x 9.6 cm. Other small masses with predominantly low T2 signal and variable enhancement are noted. The dominant uterine mass displays enhancement from baseline signal with heterogeneous pattern. Examination limited by motion. Other: RIGHT paramidline pelvic floor/Peri coccygeal and perirectal cystic area measuring 5.3 x 3.3 cm with mildly thickened wall. Baseline T1 signal, hypointense. Baseline T2 signal with thin rim of low signal and evidence of septation. It is incompletely imaged at its lower margin on post-contrast images in appears to be below the pubococcygeal muscle abutting the gluteal muscle and the sacrum and coccyx as well as the rectum, perhaps insinuated between pelvic floor musculature. No ascites. Musculoskeletal: Marked diminished T1 and T2 signal within visualized skeletal structures compatible with iron deposition in marrow spaces in this patient with recent exogenous iron administration. No visible lesion. IMPRESSION: 1. Bulky enlargement of the uterus with multiple uterine masses, the largest of which measures 10 x 10 x 9.6 cm. Given the size of the dominant lesion and the heterogeneity leiomyosarcoma is a differential consideration though no definitive findings of leiomyosarcoma are present. 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 2. RIGHT paramidline pelvic floor/Peri coccygeal and perirectal cystic area measuring 5.3 x 3.3 cm with mildly thickened wall. It is incompletely imaged at its lower margin on post-contrast images and appears to be below the pubococcygeal muscle and perhaps  insinuated between pelvic floor musculature. This may represent a tail gut cyst. The possibility of complication by infection is considered given the mildly thickened wall with enhancement. No nodularity. 3. Mild fullness of the collecting systems and ureters is similar to the previous CT evaluation with mild RIGHT hydronephrosis perhaps related to compression of the distal ureters secondary to mass effect. 4. 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 This does limit assessment. 5. Cardiomegaly. Electronically Signed   By: Zetta Bills M.D.   On: 10/02/2020 08:26   Overnight EEG with video  Result Date: 10/01/2020 Lora Havens, MD     10/02/2020  8:47 AM Patient Name: Candace Jones MRN: [id number] Epilepsy Attending: Lora Havens Referring Physician/Provider:  Myra Rude, NP Duration: 09/30/2020 1406  to 10/01/2020 1448  Patient history: 40 year old female presented with sudden onset of aphasia right facial droop and right-sided weakness.  CT head showed possible left-sided brain mass with edema.  EEG to evaluate for seizures.  Level of alertness: Awake, asleep  AEDs during EEG study: Keppra  Technical aspects: This EEG study was done with scalp electrodes positioned according to the 10-20 International system of electrode placement. Electrical activity was acquired at a sampling rate of 500Hz  and reviewed with a high frequency filter of 70Hz  and a low frequency filter of 1Hz . EEG data were recorded continuously and digitally stored.  Description:  The posterior dominant rhythm consists of 9 Hz activity of moderate voltage (25-35 uV) seen predominantly in posterior head regions, symmetric and reactive to eye opening and eye closing.   Sleep was characterized by vertex waves, sleep spindles (12 to 14 Hz), maximal frontocentral region.  EEG also showed near continuous rhythmic amplitude 2 to 3 Hz delta  slowing in left temporal region. Additionally there is also continuous generalized polymorphic mixed frequencies with predominantly 5 to 8 Hz theta-alpha activity. Hyperventilation and photic stimulation were not performed.    ABNORMALITY - Lateralized rhythmic delta activity, left temporal region - Continuous slow, generalized  IMPRESSION: This study is suggestive of cortical dysfunction in left temporal region likely secondary underlying structural abnormality, ictal, postictal state.  Additionally there is evidence of moderate diffuse encephalopathy, nonspecific etiology.  No seizures or epileptiform discharges were seen throughout the recording.  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 female admitted with right-sided weakness and aphasia found to have 2 lesions along the white-gray matter in the posterior left temporal left temporal lobe and frontal lobe.  She is also had abdominal pain and distention and was found on CT scan to have enlarged uterus with multiple uterine masses and a complex fluid collection near the rectum.  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  #1 Intracranial masses:  Differential includes malignancy and from ID standpoint clearly Toxoplasmosis, but also Cryptococcus, Histoplasmosis, TB  (does not seem typical for Neurocystercosis)  Biopsy has been done  I discussed the case with Dr.  Arnoldo Morale  One sample to be sent to fpathology so they can look for malignancy and also apply stains for Toxo, Fungi, esp crypto and TB and  A SECOND sample in sterile saline that can be used to obtain fungal cultures and AFB cultures and we can do routine cultures too though not as important as this is not going to be a bacterial pathogen  We cam also try to send tissue from path (or from micro for PCR's) but I expect the pathology itself will be definitive  #2  Uterine masses and cystic area near the rectum:  MRIs have been read and there are multiple masses and the possibility of  leiomyosarcoma has been raised.  There is also a cystic mass that is present as well and could be an abscess possibly  I expect she will need surgery to define this pathology as well  ? IR aspirate the cystic structure  #3  Rash: Syphilis titers 1:!.  Differential is also broad and could be due to Kaposi's sarcoma dimorphic fungi less likely Bartonella and also HIV infection itself.  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  Will likely need to delay treatment given her in high intracranial pressures and certainly should she have cryptococcal disease she would need delay by minimum of 5 weeks prior to initiating antiretroviral therapy  I spent greater than 35  minutes with the patient including greater than 50% of time in face to face counsel of the patient and in coordination of her care with Neurosurgery and MIcrobiology.     LOS: 2 days   Alcide Evener 10/02/2020, 1:39 PM

## 2020-10-02 NOTE — Progress Notes (Addendum)
This is to clarify an issue regarding the patient's brain biopsy.  I was contacted and told that the patient's brain biopsy specimens had been incorrectly labeled by the OR personnel.  One specimen had the patient label on the specimen cup containing the specimen.  The other specimen had the patient label on the bag containing the specimen.  The OR nurse who labeled these, Aniceto Boss, subsequently went down to the lab and identify that these specimens were correct and indeed from this patient. I am informed that the only way the lab will except the specimen is for me to sign a form assuming liability for the labeling that did not satisfy the lab.  As the only other option would be to redo the patient's brain biopsy, I signed the form under duress.

## 2020-10-02 NOTE — Anesthesia Postprocedure Evaluation (Signed)
Anesthesia Post Note  Patient: Missy Baksh  Procedure(s) Performed: LEFT CRANIOTOMY FOR TUMOR BIOPSY/ RESECTION with Brain[facility] (Left Head) APPLICATION OF CRANIAL NAVIGATION (Left )     Patient location during evaluation: PACU Anesthesia Type: General Level of consciousness: awake and alert Pain management: pain level controlled Vital Signs Assessment: post-procedure vital signs reviewed and stable Respiratory status: spontaneous breathing, nonlabored ventilation and respiratory function stable Cardiovascular status: blood pressure returned to baseline and stable Postop Assessment: no apparent nausea or vomiting Anesthetic complications: no   No complications documented.  Last Vitals:  Vitals:   10/02/20 1530 10/02/20 1545  BP: 129/78   Pulse: (!) 50 (!) 49  Resp: 17 15  Temp:    SpO2: 99% 100%    Last Pain:  Vitals:   10/02/20 1426  TempSrc:   PainSc: Asleep                 Corley Maffeo,W. EDMOND

## 2020-10-03 ENCOUNTER — Encounter (HOSPITAL_COMMUNITY): Payer: Self-pay | Admitting: Neurosurgery

## 2020-10-03 ENCOUNTER — Telehealth: Payer: Self-pay | Admitting: *Deleted

## 2020-10-03 ENCOUNTER — Inpatient Hospital Stay (HOSPITAL_COMMUNITY): Payer: Medicaid Other

## 2020-10-03 DIAGNOSIS — Z5309 Procedure and treatment not carried out because of other contraindication: Secondary | ICD-10-CM

## 2020-10-03 DIAGNOSIS — R9431 Abnormal electrocardiogram [ECG] [EKG]: Secondary | ICD-10-CM

## 2020-10-03 LAB — QUANTIFERON-TB GOLD PLUS (RQFGPL)
QuantiFERON Mitogen Value: 0.51 IU/mL
QuantiFERON Nil Value: 0.01 IU/mL
QuantiFERON TB1 Ag Value: 0.02 IU/mL
QuantiFERON TB2 Ag Value: 0.02 IU/mL

## 2020-10-03 LAB — BPAM RBC
Blood Product Expiration Date: 202205082359
Blood Product Expiration Date: 202205092359
Blood Product Expiration Date: 202205092359
Blood Product Expiration Date: 202205132359
ISSUE DATE / TIME: 202204190004
ISSUE DATE / TIME: 202204210800
ISSUE DATE / TIME: 202204220533
ISSUE DATE / TIME: 202204221139
Unit Type and Rh: 6200
Unit Type and Rh: 6200
Unit Type and Rh: 6200
Unit Type and Rh: 6200

## 2020-10-03 LAB — CBC WITH DIFFERENTIAL/PLATELET
Abs Immature Granulocytes: 0 10*3/uL (ref 0.00–0.07)
Basophils Absolute: 0 10*3/uL (ref 0.0–0.1)
Basophils Relative: 0 %
Eosinophils Absolute: 0 10*3/uL (ref 0.0–0.5)
Eosinophils Relative: 0 %
HCT: 28 % — ABNORMAL LOW (ref 36.0–46.0)
Hemoglobin: 8.1 g/dL — ABNORMAL LOW (ref 12.0–15.0)
Lymphocytes Relative: 12 %
Lymphs Abs: 0.9 10*3/uL (ref 0.7–4.0)
MCH: 19.1 pg — ABNORMAL LOW (ref 26.0–34.0)
MCHC: 28.9 g/dL — ABNORMAL LOW (ref 30.0–36.0)
MCV: 65.9 fL — ABNORMAL LOW (ref 80.0–100.0)
Monocytes Absolute: 0.5 10*3/uL (ref 0.1–1.0)
Monocytes Relative: 7 %
Neutro Abs: 5.8 10*3/uL (ref 1.7–7.7)
Neutrophils Relative %: 81 %
Platelets: 187 10*3/uL (ref 150–400)
RBC: 4.25 MIL/uL (ref 3.87–5.11)
RDW: 27.4 % — ABNORMAL HIGH (ref 11.5–15.5)
WBC: 7.2 10*3/uL (ref 4.0–10.5)
nRBC: 0 % (ref 0.0–0.2)
nRBC: 0 /100 WBC

## 2020-10-03 LAB — TYPE AND SCREEN
ABO/RH(D): A POS
Antibody Screen: NEGATIVE
Unit division: 0
Unit division: 0
Unit division: 0
Unit division: 0

## 2020-10-03 LAB — BASIC METABOLIC PANEL
Anion gap: 8 (ref 5–15)
BUN: 15 mg/dL (ref 6–20)
CO2: 21 mmol/L — ABNORMAL LOW (ref 22–32)
Calcium: 8.4 mg/dL — ABNORMAL LOW (ref 8.9–10.3)
Chloride: 109 mmol/L (ref 98–111)
Creatinine, Ser: 0.59 mg/dL (ref 0.44–1.00)
GFR, Estimated: >60 mL/min (ref >60)
Glucose, Bld: 111 mg/dL — ABNORMAL HIGH (ref 70–99)
Potassium: 4 mmol/L (ref 3.5–5.1)
Sodium: 138 mmol/L (ref 135–145)

## 2020-10-03 LAB — GLUCOSE, CAPILLARY
Glucose-Capillary: 106 mg/dL — ABNORMAL HIGH (ref 70–99)
Glucose-Capillary: 113 mg/dL — ABNORMAL HIGH (ref 70–99)
Glucose-Capillary: 129 mg/dL — ABNORMAL HIGH (ref 70–99)

## 2020-10-03 LAB — ECHOCARDIOGRAM COMPLETE
Area-P 1/2: 3.99 cm2
S' Lateral: 3.1 cm
Weight: 3167.57 oz

## 2020-10-03 LAB — QUANTIFERON-TB GOLD PLUS: QuantiFERON-TB Gold Plus: UNDETERMINED — AB

## 2020-10-03 MED ORDER — HYDRALAZINE HCL 20 MG/ML IJ SOLN
10.0000 mg | Freq: Four times a day (QID) | INTRAMUSCULAR | Status: DC | PRN
  Administered 2020-10-03: 10 mg via INTRAVENOUS
  Filled 2020-10-03: qty 1

## 2020-10-03 MED ORDER — SULFAMETHOXAZOLE-TRIMETHOPRIM 400-80 MG/5ML IV SOLN
10.0000 mg/kg/d | Freq: Two times a day (BID) | INTRAVENOUS | Status: DC
  Administered 2020-10-03 – 2020-10-08 (×10): 448.96 mg via INTRAVENOUS
  Filled 2020-10-03 (×14): qty 28.06

## 2020-10-03 NOTE — Progress Notes (Signed)
Subjective: The patient is alert and pleasant.  She tells me she is doing "good".  Objective: Vital signs in last 24 hours: Temp:  [97 F (36.1 C)-98.3 F (36.8 C)] 98 F (36.7 C) (04/22 0400) Pulse Rate:  [38-66] 38 (04/22 0600) Resp:  [12-24] 15 (04/22 0600) BP: (120-160)/(64-91) 148/86 (04/22 0600) SpO2:  [97 %-100 %] 100 % (04/22 0600) Arterial Line BP: (99-167)/(58-94) 140/70 (04/22 0600) Estimated body mass index is 30 kg/m as calculated from the following:   Height as of 04/04/19: 5' 8.11" (1.73 m).   Weight as of this encounter: 89.8 kg.   Intake/Output from previous day: 04/21 0701 - 04/22 0700 In: 2699.5 [I.V.:1991.4; IV Piggyback:708.1] Out: 2650 [Urine:2600; Blood:50] Intake/Output this shift: Total I/O In: 1028.2 [I.V.:720.2; IV Piggyback:308] Out: 600 [Urine:600]  Physical exam patient is alert and pleasant.  She is moving all 4 extremities well.  Her dressing has a small bloodstain.  We have a language barrier.  Lab Results: Recent Labs    10/02/20 0456 10/02/20 1117 10/03/20 0420  WBC 4.4  --  7.2  HGB 7.1* 9.2* 8.1*  HCT 25.5* 27.0* 28.0*  PLT 196  --  187   BMET Recent Labs    10/02/20 0456 10/02/20 1117 10/03/20 0420  NA 136 140 138  K 4.4 3.9 4.0  CL 109  --  109  CO2 21*  --  21*  GLUCOSE 115*  --  111*  BUN 12  --  15  CREATININE 0.49  --  0.59  CALCIUM 8.8*  --  8.4*    Studies/Results: CT HEAD WO CONTRAST  Result Date: 10/02/2020 CLINICAL DATA:  Brain mass.  Preoperative planning. EXAM: CT HEAD WITHOUT CONTRAST TECHNIQUE: Contiguous axial images were obtained from the base of the skull through the vertex without intravenous contrast. COMPARISON:  MRI head 09/30/2020.  CT head 10/01/2020 FINDINGS: Brain: BrainLAB protocol for preoperative planning purposes. Large areas of edema in the left frontal white matter and left temporoparietal white matter appear unchanged from recent studies. Prior MRI demonstrated associated enhancing  masses. Masses are difficult to see on unenhanced CT. There is mass-effect on the left cerebral hemisphere. 5 mm midline shift to the right similar to slightly improved from prior studies. No hydrocephalus identified.  No acute hemorrhage identified. Vascular: Negative for hyperdense vessel Skull: No focal lesion. Sinuses/Orbits: Mucosal edema paranasal sinuses. Bony thickening of the maxillary sinus bilaterally. Negative orbit Other: None IMPRESSION: Large areas of edema in the left frontal lobe and left temporoparietal lobe unchanged. Associated areas of enhancement noted on MRI. 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 No acute hemorrhage. 5 mm midline shift to the right similar to slightly improved. Electronically Signed   By: Franchot Gallo M.D.   On: 10/02/2020 09:41   CT HEAD WO CONTRAST  Result Date: 10/01/2020 CLINICAL DATA:  Brain mass EXAM: CT HEAD WITHOUT CONTRAST TECHNIQUE: Contiguous axial images were obtained from the base of the skull through the vertex without intravenous contrast. COMPARISON:  Brain MRI 09/30/2020 FINDINGS: Brain: Large areas of vasogenic edema in the left temporal lobe and left frontal lobe are unchanged. There is rightward midline shift 6 mm. No hydrocephalus. No acute hemorrhage. Vascular: No hyperdense vessel or unexpected calcification. Skull: Normal. Negative for fracture or focal lesion. Sinuses/Orbits: Right frontal sinus opacification Other: None. IMPRESSION: Unchanged appearance of large areas of vasogenic edema in the left temporal lobe and left frontal lobe with 6 mm rightward midline shift. Electronically Signed  By: Ulyses Jarred M.D.   On: 10/01/2020 23:09   MR PELVIS W WO CONTRAST  Result Date: 10/02/2020 CLINICAL DATA:  Cancer of unknown primary. EXAM: MRI ABDOMEN AND PELVIS WITHOUT AND WITH CONTRAST TECHNIQUE: Multiplanar multisequence MR imaging of the abdomen and pelvis was performed both before and after the  administration of intravenous contrast. CONTRAST:  20mL GADAVIST GADOBUTROL 1 MMOL/ML IV SOLN COMPARISON:  CT chest, abdomen and pelvis of September 29, 2020 FINDINGS: COMBINED FINDINGS FOR BOTH MR ABDOMEN AND PELVIS Lower chest: Please see dedicated CT of the chest from April 18th. No effusion or consolidation. Limited assessment of the lung bases on MRI. Heart size is enlarged. Hepatobiliary: Marked decreased T2 signal within the hepatic parenchyma in this patient who received a dose of Feraheme on 09/30/2020 which limits the evaluation in general. No visible lesion, biliary ductal dilation or pericholecystic stranding. Pancreas: Pancreas grossly normal without ductal dilation or sign of adjacent inflammation. Spleen: Spleen with marked reduction in signal on T2 weighted images, normal size. Adrenals/Urinary Tract: Adrenal glands are unremarkable. Mild fullness of collecting systems and ureters is similar to the previous CT evaluation with mild RIGHT hydronephrosis perhaps related to compression of the distal ureters secondary to mass in the pelvis. Smooth renal contours. No visible renal lesion. Stomach/Bowel: Limited assessment of the gastrointestinal tract on MRI confounded by presence of susceptibility artifact from gas and gross patient and respiratory motion. Vascular/Lymphatic: Vascular structures in the abdomen are patent. The IVC is engorged in the setting of RIGHT heart enlargement. No aneurysmal dilation of the abdominal aorta. No adenopathy visible in the abdomen or in the pelvis. Limited assessment due to motion artifact and pre-existing vascular signal related to [facility] administration. No pelvic adenopathy. Reproductive: Bulky enlargement of the uterus. Numerous uterine masses. Endometrium not assessed. Adjacent urinary bladder is collapsed due to mass effect. Distal RIGHT ureter is mildly dilated as is the distal LEFT ureter due to mass effect. Uterus measuring 16 x 9 x 11 cm. Heterogeneous mass in the  lower uterine segment is the largest mass in the uterus measuring 10 x 10 x 9.6 cm. Other small masses with predominantly low T2 signal and variable enhancement are noted. The dominant uterine mass displays enhancement from baseline signal with heterogeneous pattern. Examination limited by motion. Other: RIGHT paramidline pelvic floor/Peri coccygeal and perirectal cystic area measuring 5.3 x 3.3 cm with mildly thickened wall. Baseline T1 signal, hypointense. Baseline T2 signal with thin rim of low signal and evidence of septation. It is incompletely imaged at its lower margin on post-contrast images in appears to be below the pubococcygeal muscle abutting the gluteal muscle and the sacrum and coccyx as well as the rectum, perhaps insinuated between pelvic floor musculature. No ascites. Musculoskeletal: Marked diminished T1 and T2 signal within visualized skeletal structures compatible with iron deposition in marrow spaces in this patient with recent exogenous iron administration. No visible lesion. IMPRESSION: 1. Bulky enlargement of the uterus with multiple uterine masses, the largest of which measures 10 x 10 x 9.6 cm. Given the size of the dominant lesion and the heterogeneity leiomyosarcoma is a differential consideration though no definitive findings of leiomyosarcoma are present. 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 2. RIGHT paramidline pelvic floor/Peri coccygeal and perirectal cystic area measuring 5.3 x 3.3 cm with mildly thickened wall. It is incompletely imaged at its lower margin on post-contrast images and appears to be below the pubococcygeal muscle and perhaps insinuated between pelvic floor musculature. This may represent a  tail gut cyst. The possibility of complication by infection is considered given the mildly thickened wall with enhancement. No nodularity. 3. Mild fullness of the collecting systems and ureters is similar to the previous CT evaluation with mild RIGHT  hydronephrosis perhaps related to compression of the distal ureters secondary to mass effect. 4. 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 This does limit assessment. 5. Cardiomegaly. Electronically Signed   By: Zetta Bills M.D.   On: 10/02/2020 08:26   MR ABDOMEN W WO CONTRAST  Result Date: 10/02/2020 CLINICAL DATA:  Cancer of unknown primary. EXAM: MRI ABDOMEN AND PELVIS WITHOUT AND WITH CONTRAST TECHNIQUE: Multiplanar multisequence MR imaging of the abdomen and pelvis was performed both before and after the administration of intravenous contrast. CONTRAST:  51mL GADAVIST GADOBUTROL 1 MMOL/ML IV SOLN COMPARISON:  CT chest, abdomen and pelvis of September 29, 2020 FINDINGS: COMBINED FINDINGS FOR BOTH MR ABDOMEN AND PELVIS Lower chest: Please see dedicated CT of the chest from April 18th. No effusion or consolidation. Limited assessment of the lung bases on MRI. Heart size is enlarged. Hepatobiliary: Marked decreased T2 signal within the hepatic parenchyma in this patient who received a dose of Feraheme on 09/30/2020 which limits the evaluation in general. No visible lesion, biliary ductal dilation or pericholecystic stranding. Pancreas: Pancreas grossly normal without ductal dilation or sign of adjacent inflammation. Spleen: Spleen with marked reduction in signal on T2 weighted images, normal size. Adrenals/Urinary Tract: Adrenal glands are unremarkable. Mild fullness of collecting systems and ureters is similar to the previous CT evaluation with mild RIGHT hydronephrosis perhaps related to compression of the distal ureters secondary to mass in the pelvis. Smooth renal contours. No visible renal lesion. Stomach/Bowel: Limited assessment of the gastrointestinal tract on MRI confounded by presence of susceptibility artifact from gas and gross patient and respiratory motion. Vascular/Lymphatic: Vascular structures in the  abdomen are patent. The IVC is engorged in the setting of RIGHT heart enlargement. No aneurysmal dilation of the abdominal aorta. No adenopathy visible in the abdomen or in the pelvis. Limited assessment due to motion artifact and pre-existing vascular signal related to [facility] administration. No pelvic adenopathy. Reproductive: Bulky enlargement of the uterus. Numerous uterine masses. Endometrium not assessed. Adjacent urinary bladder is collapsed due to mass effect. Distal RIGHT ureter is mildly dilated as is the distal LEFT ureter due to mass effect. Uterus measuring 16 x 9 x 11 cm. Heterogeneous mass in the lower uterine segment is the largest mass in the uterus measuring 10 x 10 x 9.6 cm. Other small masses with predominantly low T2 signal and variable enhancement are noted. The dominant uterine mass displays enhancement from baseline signal with heterogeneous pattern. Examination limited by motion. Other: RIGHT paramidline pelvic floor/Peri coccygeal and perirectal cystic area measuring 5.3 x 3.3 cm with mildly thickened wall. Baseline T1 signal, hypointense. Baseline T2 signal with thin rim of low signal and evidence of septation. It is incompletely imaged at its lower margin on post-contrast images in appears to be below the pubococcygeal muscle abutting the gluteal muscle and the sacrum and coccyx as well as the rectum, perhaps insinuated between pelvic floor musculature. No ascites. Musculoskeletal: Marked diminished T1 and T2 signal within visualized skeletal structures compatible with iron deposition in marrow spaces in this patient with recent exogenous iron administration. No visible lesion. IMPRESSION: 1. Bulky enlargement of the uterus with multiple uterine masses, the largest of which measures 10 x 10 x  9.6 cm. Given the size of the dominant lesion and the heterogeneity leiomyosarcoma is a differential consideration though no definitive findings of leiomyosarcoma are present. Numerous leiomyomata  are demonstrated throughout the uterus. 2. RIGHT paramidline pelvic floor/Peri coccygeal and perirectal cystic area measuring 5.3 x 3.3 cm with mildly thickened wall. It is incompletely imaged at its lower margin on post-contrast images and appears to be below the pubococcygeal muscle and perhaps insinuated between pelvic floor musculature. This may represent a tail gut cyst. The possibility of complication by infection is considered given the mildly thickened wall with enhancement. No nodularity. 3. Mild fullness of the collecting systems and ureters is similar to the previous CT evaluation with mild RIGHT hydronephrosis perhaps related to compression of the distal ureters secondary to mass effect. 4. 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 This does limit assessment. 5. Cardiomegaly. Electronically Signed   By: Zetta Bills M.D.   On: 10/02/2020 08:26   Overnight EEG with video  Result Date: 10/01/2020 Lora Havens, MD     10/02/2020  8:47 AM Patient Name: Candace Jones MRN: [id number] Epilepsy Attending: Lora Havens Referring Physician/Provider:  Myra Rude, NP Duration: 09/30/2020 1406 to 10/01/2020 1448  Patient history: 40 year old female presented with sudden onset of aphasia right facial droop and right-sided weakness.  CT head showed possible left-sided brain mass with edema.  EEG to evaluate for seizures.  Level of alertness: Awake, asleep  AEDs during EEG study: Keppra  Technical aspects: This EEG study was done with scalp electrodes positioned according to the 10-20 International system of electrode placement. Electrical activity was acquired at a sampling rate of 500Hz  and reviewed with a high frequency filter of 70Hz  and a low frequency filter of 1Hz . EEG data were recorded continuously and digitally stored.  Description:  The posterior dominant rhythm consists of 9 Hz activity of  moderate voltage (25-35 uV) seen predominantly in posterior head regions, symmetric and reactive to eye opening and eye closing.   Sleep was characterized by vertex waves, sleep spindles (12 to 14 Hz), maximal frontocentral region.  EEG also showed near continuous rhythmic amplitude 2 to 3 Hz delta slowing in left temporal region. Additionally there is also continuous generalized polymorphic mixed frequencies with predominantly 5 to 8 Hz theta-alpha activity. Hyperventilation and photic stimulation were not performed.    ABNORMALITY - Lateralized rhythmic delta activity, left temporal region - Continuous slow, generalized  IMPRESSION: This study is suggestive of cortical dysfunction in left temporal region likely secondary underlying structural abnormality, ictal, postictal state.  Additionally there is evidence of moderate diffuse encephalopathy, nonspecific etiology.  No seizures or epileptiform discharges were seen throughout the recording.  Priyanka Barbra Sarks    Assessment/Plan: Postop day #1: The patient is doing well.  We are awaiting the results of the patient's pathology, fungal/TB/toxoplasmosis stains and cultures.  LOS: 3 days     Ophelia Charter 10/03/2020, 6:51 AM

## 2020-10-03 NOTE — Progress Notes (Addendum)
Subjective:  She wants to "be done with treatment" for her conditions (I had to coach her that her current acute conditions would require time to work-up and treat as long as well as her underlying life-threatening immunity for immunodeficiency   Antibiotics:  Anti-infectives (From admission, onward)   Start     Dose/Rate Route Frequency Ordered Stop   10/02/20 1630  ceFAZolin (ANCEF) IVPB 2g/100 mL premix        2 g 200 mL/hr over 30 Minutes Intravenous Every 8 hours 10/02/20 1533 10/03/20 0051   10/02/20 1038  ceFAZolin (ANCEF) 2-4 GM/100ML-% IVPB       Note to Pharmacy: Laurita Quint   : cabinet override      10/02/20 1038 10/02/20 2244      Medications: Scheduled Meds: . sodium chloride   Intravenous Once  . Chlorhexidine Gluconate Cloth  6 each Topical Daily  . dexamethasone (DECADRON) injection  4 mg Intravenous Q6H   Followed by  . [START ON 10/04/2020] dexamethasone (DECADRON) injection  4 mg Intravenous Q8H  . dexamethasone (DECADRON) injection  6 mg Intravenous Q6H  . docusate sodium  100 mg Oral BID  . feeding supplement  237 mL Oral BID BM  . ferrous sulfate  325 mg Oral Q breakfast  . nystatin  5 mL Oral QID  . pantoprazole (PROTONIX) IV  40 mg Intravenous QHS  . sodium chloride flush  3 mL Intravenous Once   Continuous Infusions: . 0.9 % NaCl with KCl 20 mEq / L 75 mL/hr at 10/03/20 1200  . levETIRAcetam Stopped (10/03/20 0809)   PRN Meds:.acetaminophen **OR** acetaminophen, acetaminophen **OR** acetaminophen, HYDROcodone-acetaminophen, labetalol, LORazepam, morphine injection, ondansetron **OR** ondansetron (ZOFRAN) IV, promethazine    Objective: Weight change:   Intake/Output Summary (Last 24 hours) at 10/03/2020 1526 Last data filed at 10/03/2020 1400 Gross per 24 hour  Intake 1970.4 ml  Output 2350 ml  Net -379.6 ml   Blood pressure (!) 139/109, pulse (!) 54, temperature 97.6 F (36.4 C), temperature source Oral, resp. rate 16, weight  89.8 kg, SpO2 99 %. Temp:  [97.5 F (36.4 C)-98 F (36.7 C)] 97.6 F (36.4 C) (04/22 1200) Pulse Rate:  [37-61] 54 (04/22 1405) Resp:  [12-21] 16 (04/22 1405) BP: (120-156)/(64-109) 139/109 (04/22 1405) SpO2:  [96 %-100 %] 99 % (04/22 1405) Arterial Line BP: (99-161)/(58-94) 136/62 (04/22 1005)  Physical Exam: Physical Exam Constitutional:      Appearance: She is underweight.  HENT:     Head: Normocephalic and atraumatic.     Nose: Nose normal.  Eyes:     Extraocular Movements: Extraocular movements intact.  Cardiovascular:     Rate and Rhythm: Normal rate.  Pulmonary:     Effort: Pulmonary effort is normal. No respiratory distress.     Breath sounds: No wheezing.  Abdominal:     General: There is distension.     Tenderness: There is abdominal tenderness.  Skin:    General: Skin is warm.     Findings: Rash present.  Psychiatric:        Attention and Perception: Attention normal.        Mood and Affect: Mood is anxious.        Behavior: Behavior normal.        Thought Content: Thought content normal.        Cognition and Memory: Cognition and memory normal.        Judgment: Judgment normal.  CBC:    BMET Recent Labs    10/02/20 0456 10/02/20 1117 10/03/20 0420  NA 136 140 138  K 4.4 3.9 4.0  CL 109  --  109  CO2 21*  --  21*  GLUCOSE 115*  --  111*  BUN 12  --  15  CREATININE 0.49  --  0.59  CALCIUM 8.8*  --  8.4*     Liver Panel  No results for input(s): PROT, ALBUMIN, AST, ALT, ALKPHOS, BILITOT, BILIDIR, IBILI in the last 72 hours.     Sedimentation Rate No results for input(s): ESRSEDRATE in the last 72 hours. C-Reactive Protein No results for input(s): CRP in the last 72 hours.  Micro Results: Recent Results (from the past 720 hour(s))  Resp Panel by RT-PCR (Flu A&B, Covid) Nasopharyngeal Swab     Status: None   Collection Time: 09/29/20 10:45 PM   Specimen: Nasopharyngeal Swab; Nasopharyngeal(NP) swabs in vial transport medium   Result Value Ref Range Status   SARS Coronavirus 2 by RT PCR NEGATIVE NEGATIVE Final    Comment: (NOTE) SARS-CoV-2 target nucleic acids are NOT DETECTED.  The SARS-CoV-2 RNA is generally detectable in upper respiratory specimens during the acute phase of infection. The lowest concentration of SARS-CoV-2 viral copies this assay can detect is 138 copies/mL. A negative result does not preclude SARS-Cov-2 infection and should not be used as the sole basis for treatment or other patient management decisions. A negative result may occur with  improper specimen collection/handling, submission of specimen other than nasopharyngeal swab, presence of viral mutation(s) within the areas targeted by this assay, and inadequate number of viral copies(<138 copies/mL). A negative result must be combined with clinical observations, patient history, and epidemiological information. The expected result is Negative.  Fact Sheet for Patients:  BloggerCourse.comhttps://www.fda.gov/media/152166/download  Fact Sheet for Healthcare Providers:  SeriousBroker.ithttps://www.fda.gov/media/152162/download  This test is no t yet approved or cleared by the [place] FDA and  has been authorized for detection and/or diagnosis of SARS-CoV-2 by FDA under an Emergency Use Authorization (EUA). This EUA will remain  in effect (meaning this test can be used) for the duration of the COVID-19 declaration under Section 564(b)(1) of the Act, 21 U.S.C.section 360bbb-3(b)(1), unless the authorization is terminated  or revoked sooner.       Influenza A by PCR NEGATIVE NEGATIVE Final   Influenza B by PCR NEGATIVE NEGATIVE Final    Comment: (NOTE) The Xpert Xpress SARS-CoV-2/FLU/RSV plus assay is intended as an aid in the diagnosis of influenza from Nasopharyngeal swab specimens and should not be used as a sole basis for treatment. Nasal washings and aspirates are unacceptable for Xpert Xpress SARS-CoV-2/FLU/RSV testing.  Fact Sheet for  Patients: BloggerCourse.comhttps://www.fda.gov/media/152166/download  Fact Sheet for Healthcare Providers: SeriousBroker.ithttps://www.fda.gov/media/152162/download  This test is not yet approved or cleared by the [place] FDA and has been authorized for detection and/or diagnosis of SARS-CoV-2 by FDA under an Emergency Use Authorization (EUA). This EUA will remain in effect (meaning this test can be used) for the duration of the COVID-19 declaration under Section 564(b)(1) of the Act, 21 U.S.C. section 360bbb-3(b)(1), unless the authorization is terminated or revoked.  Performed at [facility], [address] [place], [place] [address]   MRSA PCR Screening     Status: None   Collection Time: 09/30/20  1:07 AM   Specimen: Nasal Mucosa; Nasopharyngeal  Result Value Ref Range Status   MRSA by PCR NEGATIVE NEGATIVE Final    Comment:  The GeneXpert MRSA Assay (FDA approved for NASAL specimens only), is one component of a comprehensive MRSA colonization surveillance program. It is not intended to diagnose MRSA infection nor to guide or monitor treatment for MRSA infections. Performed at [facility], [address], [place], [place] [address]   Culture, fungus without smear     Status: None (Preliminary result)   Collection Time: 10/02/20 12:36 PM   Specimen: PATH Other; Tissue  Result Value Ref Range Status   Specimen Description BIOPSY  Final   Special Requests LEFT POSTERIOR BRAIN  Final   Culture   Final    NO FUNGUS ISOLATED AFTER 1 DAY Performed at [facility], [address], [place], [place] [address]    Report Status PENDING  Incomplete    Studies/Results: CT HEAD WO CONTRAST  Result Date: 10/02/2020 CLINICAL DATA:  Brain mass.  Preoperative planning. EXAM: CT HEAD WITHOUT CONTRAST TECHNIQUE: Contiguous axial images were obtained from the base of the skull through the vertex without intravenous contrast. COMPARISON:  MRI head 09/30/2020.  CT head 10/01/2020  FINDINGS: Brain: BrainLAB protocol for preoperative planning purposes. Large areas of edema in the left frontal white matter and left temporoparietal white matter appear unchanged from recent studies. Prior MRI demonstrated associated enhancing masses. Masses are difficult to see on unenhanced CT. There is mass-effect on the left cerebral hemisphere. 5 mm midline shift to the right similar to slightly improved from prior studies. No hydrocephalus identified.  No acute hemorrhage identified. Vascular: Negative for hyperdense vessel Skull: No focal lesion. Sinuses/Orbits: Mucosal edema paranasal sinuses. Bony thickening of the maxillary sinus bilaterally. Negative orbit Other: None IMPRESSION: Large areas of edema in the left frontal lobe and left temporoparietal lobe unchanged. Associated areas of enhancement noted on MRI. 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 No acute hemorrhage. 5 mm midline shift to the right similar to slightly improved. Electronically Signed   By: Marlan Palau M.D.   On: 10/02/2020 09:41   CT HEAD WO CONTRAST  Result Date: 10/01/2020 CLINICAL DATA:  Brain mass EXAM: CT HEAD WITHOUT CONTRAST TECHNIQUE: Contiguous axial images were obtained from the base of the skull through the vertex without intravenous contrast. COMPARISON:  Brain MRI 09/30/2020 FINDINGS: Brain: Large areas of vasogenic edema in the left temporal lobe and left frontal lobe are unchanged. There is rightward midline shift 6 mm. No hydrocephalus. No acute hemorrhage. Vascular: No hyperdense vessel or unexpected calcification. Skull: Normal. Negative for fracture or focal lesion. Sinuses/Orbits: Right frontal sinus opacification Other: None. IMPRESSION: Unchanged appearance of large areas of vasogenic edema in the left temporal lobe and left frontal lobe with 6 mm rightward midline shift. Electronically Signed   By: Deatra Robinson M.D.   On: 10/01/2020 23:09   MR PELVIS W WO  CONTRAST  Result Date: 10/02/2020 CLINICAL DATA:  Cancer of unknown primary. EXAM: MRI ABDOMEN AND PELVIS WITHOUT AND WITH CONTRAST TECHNIQUE: Multiplanar multisequence MR imaging of the abdomen and pelvis was performed both before and after the administration of intravenous contrast. CONTRAST:  19mL GADAVIST GADOBUTROL 1 MMOL/ML IV SOLN COMPARISON:  CT chest, abdomen and pelvis of September 29, 2020 FINDINGS: COMBINED FINDINGS FOR BOTH MR ABDOMEN AND PELVIS Lower chest: Please see dedicated CT of the chest from April 18th. No effusion or consolidation. Limited assessment of the lung bases on MRI. Heart size is enlarged. Hepatobiliary: Marked decreased T2 signal within the hepatic parenchyma in this patient who received a dose  of Feraheme on 09/30/2020 which limits the evaluation in general. No visible lesion, biliary ductal dilation or pericholecystic stranding. Pancreas: Pancreas grossly normal without ductal dilation or sign of adjacent inflammation. Spleen: Spleen with marked reduction in signal on T2 weighted images, normal size. Adrenals/Urinary Tract: Adrenal glands are unremarkable. Mild fullness of collecting systems and ureters is similar to the previous CT evaluation with mild RIGHT hydronephrosis perhaps related to compression of the distal ureters secondary to mass in the pelvis. Smooth renal contours. No visible renal lesion. Stomach/Bowel: Limited assessment of the gastrointestinal tract on MRI confounded by presence of susceptibility artifact from gas and gross patient and respiratory motion. Vascular/Lymphatic: Vascular structures in the abdomen are patent. The IVC is engorged in the setting of RIGHT heart enlargement. No aneurysmal dilation of the abdominal aorta. No adenopathy visible in the abdomen or in the pelvis. Limited assessment due to motion artifact and pre-existing vascular signal related to [facility] administration. No pelvic adenopathy. Reproductive: Bulky enlargement of the uterus.  Numerous uterine masses. Endometrium not assessed. Adjacent urinary bladder is collapsed due to mass effect. Distal RIGHT ureter is mildly dilated as is the distal LEFT ureter due to mass effect. Uterus measuring 16 x 9 x 11 cm. Heterogeneous mass in the lower uterine segment is the largest mass in the uterus measuring 10 x 10 x 9.6 cm. Other small masses with predominantly low T2 signal and variable enhancement are noted. The dominant uterine mass displays enhancement from baseline signal with heterogeneous pattern. Examination limited by motion. Other: RIGHT paramidline pelvic floor/Peri coccygeal and perirectal cystic area measuring 5.3 x 3.3 cm with mildly thickened wall. Baseline T1 signal, hypointense. Baseline T2 signal with thin rim of low signal and evidence of septation. It is incompletely imaged at its lower margin on post-contrast images in appears to be below the pubococcygeal muscle abutting the gluteal muscle and the sacrum and coccyx as well as the rectum, perhaps insinuated between pelvic floor musculature. No ascites. Musculoskeletal: Marked diminished T1 and T2 signal within visualized skeletal structures compatible with iron deposition in marrow spaces in this patient with recent exogenous iron administration. No visible lesion. IMPRESSION: 1. Bulky enlargement of the uterus with multiple uterine masses, the largest of which measures 10 x 10 x 9.6 cm. Given the size of the dominant lesion and the heterogeneity leiomyosarcoma is a differential consideration though no definitive findings of leiomyosarcoma are present. 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 2. RIGHT paramidline pelvic floor/Peri coccygeal and perirectal cystic area measuring 5.3 x 3.3 cm with mildly thickened wall. It is incompletely imaged at its lower margin on post-contrast images and appears to be below the pubococcygeal muscle and perhaps insinuated between pelvic floor musculature. This may represent a  tail gut cyst. The possibility of complication by infection is considered given the mildly thickened wall with enhancement. No nodularity. 3. Mild fullness of the collecting systems and ureters is similar to the previous CT evaluation with mild RIGHT hydronephrosis perhaps related to compression of the distal ureters secondary to mass effect. 4. 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 This does limit assessment. 5. Cardiomegaly. Electronically Signed   By: Zetta Bills M.D.   On: 10/02/2020 08:26   MR ABDOMEN W WO CONTRAST  Result Date: 10/02/2020 CLINICAL DATA:  Cancer of unknown primary. EXAM: MRI ABDOMEN AND PELVIS WITHOUT AND WITH CONTRAST TECHNIQUE: Multiplanar multisequence MR imaging of the abdomen and pelvis was performed both  before and after the administration of intravenous contrast. CONTRAST:  15mL GADAVIST GADOBUTROL 1 MMOL/ML IV SOLN COMPARISON:  CT chest, abdomen and pelvis of September 29, 2020 FINDINGS: COMBINED FINDINGS FOR BOTH MR ABDOMEN AND PELVIS Lower chest: Please see dedicated CT of the chest from April 18th. No effusion or consolidation. Limited assessment of the lung bases on MRI. Heart size is enlarged. Hepatobiliary: Marked decreased T2 signal within the hepatic parenchyma in this patient who received a dose of Feraheme on 09/30/2020 which limits the evaluation in general. No visible lesion, biliary ductal dilation or pericholecystic stranding. Pancreas: Pancreas grossly normal without ductal dilation or sign of adjacent inflammation. Spleen: Spleen with marked reduction in signal on T2 weighted images, normal size. Adrenals/Urinary Tract: Adrenal glands are unremarkable. Mild fullness of collecting systems and ureters is similar to the previous CT evaluation with mild RIGHT hydronephrosis perhaps related to compression of the distal ureters secondary to mass in the pelvis. Smooth renal  contours. No visible renal lesion. Stomach/Bowel: Limited assessment of the gastrointestinal tract on MRI confounded by presence of susceptibility artifact from gas and gross patient and respiratory motion. Vascular/Lymphatic: Vascular structures in the abdomen are patent. The IVC is engorged in the setting of RIGHT heart enlargement. No aneurysmal dilation of the abdominal aorta. No adenopathy visible in the abdomen or in the pelvis. Limited assessment due to motion artifact and pre-existing vascular signal related to [facility] administration. No pelvic adenopathy. Reproductive: Bulky enlargement of the uterus. Numerous uterine masses. Endometrium not assessed. Adjacent urinary bladder is collapsed due to mass effect. Distal RIGHT ureter is mildly dilated as is the distal LEFT ureter due to mass effect. Uterus measuring 16 x 9 x 11 cm. Heterogeneous mass in the lower uterine segment is the largest mass in the uterus measuring 10 x 10 x 9.6 cm. Other small masses with predominantly low T2 signal and variable enhancement are noted. The dominant uterine mass displays enhancement from baseline signal with heterogeneous pattern. Examination limited by motion. Other: RIGHT paramidline pelvic floor/Peri coccygeal and perirectal cystic area measuring 5.3 x 3.3 cm with mildly thickened wall. Baseline T1 signal, hypointense. Baseline T2 signal with thin rim of low signal and evidence of septation. It is incompletely imaged at its lower margin on post-contrast images in appears to be below the pubococcygeal muscle abutting the gluteal muscle and the sacrum and coccyx as well as the rectum, perhaps insinuated between pelvic floor musculature. No ascites. Musculoskeletal: Marked diminished T1 and T2 signal within visualized skeletal structures compatible with iron deposition in marrow spaces in this patient with recent exogenous iron administration. No visible lesion. IMPRESSION: 1. Bulky enlargement of the uterus with multiple  uterine masses, the largest of which measures 10 x 10 x 9.6 cm. Given the size of the dominant lesion and the heterogeneity leiomyosarcoma is a differential consideration though no definitive findings of leiomyosarcoma are present. 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 2. RIGHT paramidline pelvic floor/Peri coccygeal and perirectal cystic area measuring 5.3 x 3.3 cm with mildly thickened wall. It is incompletely imaged at its lower margin on post-contrast images and appears to be below the pubococcygeal muscle and perhaps insinuated between pelvic floor musculature. This may represent a tail gut cyst. The possibility of complication by infection is considered given the mildly thickened wall with enhancement. No nodularity. 3. Mild fullness of the collecting systems and ureters is similar to the previous CT evaluation with mild RIGHT hydronephrosis perhaps related to compression of the distal ureters secondary to  mass effect. 4. 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 This does limit assessment. 5. Cardiomegaly. Electronically Signed   By: Zetta Bills M.D.   On: 10/02/2020 08:26      Assessment/Plan:  INTERVAL HISTORY:    Patient has had neurosurgery.  I came by pathology and talk to staff there talk to Dr. Tresa Moore.  My understanding is that the area is largely necrotic and not much to suggest malignancy infection is more likely special stains have been applied are not yet back to be viewed but may be by 4 PM this afternoon.   Principal Problem:   Intracranial mass Active Problems:   Seizure (HCC)   Microcytic hypochromic anemia   Brain mass   Ankle pain   AIDS (acquired immune deficiency syndrome) (HCC)   Uterine mass   Perirectal abscess   Brain tumor ([place])    Candace Jones is a 40 y.o. female admitted with right-sided weakness and aphasia found to have 2 lesions  along the white-gray matter in the posterior left temporal left temporal lobe and frontal lobe.  She is also had abdominal pain and distention and was found on CT scan to have enlarged uterus with multiple uterine masses and a complex fluid collection near the rectum.  He has a diffuse rash and is also lost quite a bit of weight over the last 3 months  She has been found to have HIV and AIDS with a CD4 count of less than 35    #1 Intracranial masses:  Special stains should be back this afternoon and hopefully read.  My understanding is this does not appear to be a malignancy  Will initiate antimicrobial therapy based on special stains   #2  Uterine masses and cystic area near the rectum:  Gynecology oncology have reviewed the films and feel that the area here is unlikely to be a leiomyosarcoma more likely to be a benign finding.   I am still wondering about the cystic area that was seen near the rectum.  Ask radiology to further review this and consider IR guided drainage or biopsy if there is indeed suspicion for abscess here   #3  Rash: Syphilis titers 1:!.  But treatment pallidum antibody was negative   Differential is also broad and could be due to Kaposi's sarcoma dimorphic fungi less likely Bartonella and also HIV infection itself.     #4 thrush using nystatin swish and swallow and avoid systemic antifungal agents until we have delineated what is going on in her brain and her abdomen  #5 HIV  AIDS  Will likely need to delay treatment given her in high intracranial pressures and midline shift and certainly should she have cryptococcal disease she would need delay by minimum of 5 weeks prior to initiating antiretroviral therapy  #6 Blurry vision: This is apparently resolved now.  #7  Ankle pain x-ray showed sclerotic appearance to the navicular bone.  May need to do further imaging for this  I spent greater than 35 minutes with the patient including greater than 50% of  time in face to face counsel of the patient using the iPad telephonic translator regarding her diagnosis and possible diagnoses and in coordination of her care with pathology and the primary team    Dr. Juleen China to check on the patient this weekend.     LOS: 3 days   Alcide Evener 10/03/2020, 3:26 PM

## 2020-10-03 NOTE — Telephone Encounter (Signed)
Dr Arnoldo Morale' office calling to make sure patient's toxoplasmosis order is in correctly for processing. RN reached out to Dr Tommy Medal who states he spoke with the lab about this as below:  There isn't a toxo order for culture we will have to see what path shows. They will stain Candace Jones tissue I called them last night re this Candace Gandy, RN

## 2020-10-03 NOTE — Progress Notes (Signed)
  Echocardiogram 2D Echocardiogram has been performed.  Candace Jones 10/03/2020, 4:25 PM

## 2020-10-03 NOTE — Progress Notes (Signed)
Pharmacy Antibiotic Note  Candace Jones is a 40 y.o. 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  Pharmacy has been consulted for trimethoprim/sulfa dosing. Scr 0.59.  Recommended dose is 10 mg/kg/day divided q12hr. Weight 89.8 kg  Plan: Trimethoprim/sulfa 449 mg IV q12hr Monitor renal function, potassium, f/u cultures and stains.  Weight: 89.8 kg (197 lb 15.6 oz)  Temp (24hrs), Avg:97.7 F (36.5 C), Min:97.4 F (36.3 C), Max:98 F (36.7 C)  Recent Labs  Lab 09/29/20 1814 09/29/20 1822 09/30/20 0555 10/01/20 0744 10/02/20 0456 10/03/20 0420  WBC 4.1  --  3.1* 4.9 4.4 7.2  CREATININE 0.57 0.50 0.48 0.58 0.49 0.59    CrCl cannot be calculated (Unknown ideal weight.).    No Known Allergies  Thank you for allowing pharmacy to be a part of this patient's care.  Alanda Slim, PharmD, [facility] Clinical Pharmacist Please see AMION for all Pharmacists' Contact Phone Numbers 10/03/2020, 5:29 PM

## 2020-10-03 NOTE — Progress Notes (Signed)
PROGRESS NOTE    Candace Jones  NID:[id number] DOB: Aug 07, 1980 DOA: 09/29/2020 PCP: Patient, No Pcp Per (Inactive)   Chief Complain: Aphasia, right-sided weakness  Brief Narrative: Patient is a 40 year old female from [place] with no significant past medical history was brought to the emergency department with complaints of aphasia, right facial droop, right-sided weakness.  She was also found to be confused and code stroke was called in the emergency department.  She was also complaining of mild headache since last few days, also complained of severe menorrhagia.  She also had a generalized tonic-clonic seizure in the emergency department.  CT head done in the emergency department showed possible left-sided brain mass with edema. 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   metastatic process.  Neurosurgery, neurology consulted. She was loaded with Keppra on presentation.  Lab work also showed hemoglobin of 6, transfused with PRBC. She underwent open brain biopsy by neurosurgery on 10/02/2020.  Pending biopsy report  Assessment & Plan:   Principal Problem:   Intracranial mass Active Problems:   Seizure (HCC)   Microcytic hypochromic anemia   Brain mass   Ankle pain   AIDS (acquired immune deficiency syndrome) (HCC)   Uterine mass   Perirectal abscess   Brain tumor (HCC)   Intracranial mass:  CT head done in the emergency department showed possible left-sided brain mass with edema. 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    Neurosurgery, neurology consulted.  She was loaded with Keppra on presentation.  Also started on Decadron. Neurosurgery thinks that this lesions might be from toxoplasmosis if HIV is positive.  Lumbar puncture not possible due to possible increased intracranial pressure/midline shift. She underwent open brain  biopsy by neurosurgery on 10/02/2020.  Pending biopsy report: Follow-up toxoplasma, fungus, cryptococcus, AFB, culture, pathology.   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  Toxoplasma IgG  Positive,IgM negative. also checking QuantiFERON, histoplasma.reactive RPR,negative  cryptococcus.  ID following. She was complaining of blurry vision earlier but now is saying that her vision is clear.  She also has a diffuse maculopapular skin lesions, but these lesions are healing, do not look active, patient complains of some itching.  Bradycardia: Patient persistently bradycardic with heart rate in the range of 30-50.  She is asymptomatic.  We will check twelve-lead EKG and echocardiogram.  Monitor on telemetry.  Denies any lightheadedness or dizziness  Seizure: Had an episode of tonic-clonic seizure in the emergency department most likely acid with the brain mass/cystic lesion.  Neurology was following.  EEG showed cortical dysfunction in the left temporal region, moderate diffuse encephalopathy, no seizures or epileptiform discharges.  Continue Keppra 750 mg twice a day.  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   MRI abd/pelvis showed bulky enlargement of the uterus with multiple uterine masses, the largest of which measures 10 x 10 x 9.6 cm with suspicion for leiomyosarcoma.Also showed  paramidline pelvic floor/Peri coccygeal and perirectal cystic area measuring 5.3 x 3.3 cm with mildly thickened wall,possible  tail gut cyst.  She denies any rectal pain or rectal bleeding or discomfort. We discussed with oncology about this finding.  We will follow-up pathology from the brain biopsy.  Acute metabolic encephalopathy: Likely precipitated by seizure.  Currently  alert and oriented   microcytic hypochromic anemia: No previous labs to  compare.  Was having heavy menstrual cycles.  She was transfused with  PRBCs during this hospitalization.  Monitor CBC.  Hemoglobin currently stable at 8.  Iron studies showed low iron, given a dose of iron infusion.  Continue oral supplementation.  Hypokalemia: Supplemented with potassium.  Debility/deconditioning: We will request a PT/OT evaluation  Nutrition Problem: Increased nutrient needs Etiology: acute illness      DVT prophylaxis:SCD Code Status: Full Family Communication: Sister at the bedside Status is: Inpatient  Remains inpatient appropriate because:Inpatient level of care appropriate due to severity of illness   Dispo: The patient is from: Home              Anticipated d/c is to: Home              Patient currently is not medically stable to d/c.   Difficult to place patient No     Consultants: Neurology, neurosurgery  Procedures:  Antimicrobials:  Anti-infectives (From admission, onward)   Start     Dose/Rate Route Frequency Ordered Stop   10/02/20 1630  ceFAZolin (ANCEF) IVPB 2g/100 mL premix        2 g 200 mL/hr over 30 Minutes Intravenous Every 8 hours 10/02/20 1533 10/03/20 0051   10/02/20 1038  ceFAZolin (ANCEF) 2-4 GM/100ML-% IVPB       Note to Pharmacy: Laurita Quint   : cabinet override      10/02/20 1038 10/02/20 2244      Subjective: Patient seen and examined the bedside this morning.  Hemodynamically stable during my evaluation.    Complains of some pain on the suture site on the head but looks very comfortable  Objective: Vitals:   10/03/20 0400 10/03/20 0500 10/03/20 0600 10/03/20 0700  BP: 137/87 (!) 142/79 (!) 148/86 (!) 151/68  Pulse: (!) 40 (!) 39 (!) 38 (!) 37  Resp: 15 15 15 16   Temp: 98 F (36.7 C)     TempSrc: Axillary     SpO2: 100% 100% 100% 100%  Weight:        Intake/Output Summary (Last 24 hours) at 10/03/2020 0802 Last data filed at 10/03/2020 0600 Gross per 24 hour  Intake 2699.52 ml  Output 2650 ml  Net 49.52  ml   Filed Weights   09/29/20 1925  Weight: 89.8 kg    Examination:  General exam: Overall comfortable, not in distress HEENT: PERRL, sutures on the craniotomy site Respiratory system:  no wheezes or crackles  Cardiovascular system: S1 & S2 heard, RRR.  Gastrointestinal system: Abdomen is nondistended, soft and nontender.  Lower abdominal mass Central nervous system: Alert and oriented Extremities: No edema, no clubbing ,no cyanosis Skin: Healing/dry maculopapular rash on the face, trunk and upper extremities   Data Reviewed: I have personally reviewed following labs and imaging studies  CBC: Recent Labs  Lab 09/29/20 1814 09/29/20 1822 09/30/20 0555 10/01/20 0744 10/02/20 0456 10/02/20 1117 10/03/20 0420  WBC 4.1  --  3.1* 4.9 4.4  --  7.2  NEUTROABS 1.8  --  2.0 3.1 3.4  --  5.8  HGB 6.6*   < > 7.1* 7.3* 7.1* 9.2* 8.1*  HCT 24.3*   < > 25.5* 25.7* 25.5* 27.0* 28.0*  MCV 61.5*  --  63.1* 62.5* 64.6*  --  65.9*  PLT 213  --  183 219 196  --  187   < > = values in this interval not displayed.  Basic Metabolic Panel: Recent Labs  Lab 09/29/20 1814 09/29/20 1822 09/30/20 0555 10/01/20 0744 10/01/20 2222 10/02/20 0456 10/02/20 1117 10/03/20 0420  NA 133* 139 136 137  --  136 140 138  K 3.5 3.5 3.8 3.3*  --  4.4 3.9 4.0  CL 105 106 109 108  --  109  --  109  CO2 19*  --  20* 24  --  21*  --  21*  GLUCOSE 114* 111* 104* 99  --  115*  --  111*  BUN 10 10 6 11   --  12  --  15  CREATININE 0.57 0.50 0.48 0.58  --  0.49  --  0.59  CALCIUM 8.7*  --  8.5* 8.8*  --  8.8*  --  8.4*  MG  --   --   --   --  2.2  --   --   --   PHOS  --   --   --   --  3.6  --   --   --    GFR: CrCl cannot be calculated (Unknown ideal weight.). Liver Function Tests: Recent Labs  Lab 09/29/20 1814 09/30/20 0555  AST 41 33  ALT 27 24  ALKPHOS 66 60  BILITOT 0.6 0.7  PROT 9.7* 9.0*  ALBUMIN 3.6 3.2*   No results for input(s): LIPASE, AMYLASE in the last 168 hours. No results  for input(s): AMMONIA in the last 168 hours. Coagulation Profile: Recent Labs  Lab 09/29/20 1814  INR 1.1   Cardiac Enzymes: No results for input(s): CKTOTAL, CKMB, CKMBINDEX, TROPONINI in the last 168 hours. BNP (last 3 results) No results for input(s): PROBNP in the last 8760 hours. HbA1C: No results for input(s): HGBA1C in the last 72 hours. CBG: Recent Labs  Lab 09/30/20 1634 10/02/20 0007 10/02/20 0600 10/02/20 1559 10/02/20 2333  GLUCAP 121* 114* 122* 115* 127*   Lipid Profile: No results for input(s): CHOL, HDL, LDLCALC, TRIG, CHOLHDL, LDLDIRECT in the last 72 hours. Thyroid Function Tests: No results for input(s): TSH, T4TOTAL, FREET4, T3FREE, THYROIDAB in the last 72 hours. Anemia Panel: Recent Labs    09/30/20 1109  FERRITIN 6*  TIBC 498*  IRON 18*   Sepsis Labs: No results for input(s): PROCALCITON, LATICACIDVEN in the last 168 hours.  Recent Results (from the past 240 hour(s))  Resp Panel by RT-PCR (Flu A&B, Covid) Nasopharyngeal Swab     Status: None   Collection Time: 09/29/20 10:45 PM   Specimen: Nasopharyngeal Swab; Nasopharyngeal(NP) swabs in vial transport medium  Result Value Ref Range Status   SARS Coronavirus 2 by RT PCR NEGATIVE NEGATIVE Final    Comment: (NOTE) SARS-CoV-2 target nucleic acids are NOT DETECTED.  The SARS-CoV-2 RNA is generally detectable in upper respiratory specimens during the acute phase of infection. The lowest concentration of SARS-CoV-2 viral copies this assay can detect is 138 copies/mL. A negative result does not preclude SARS-Cov-2 infection and should not be used as the sole basis for treatment or other patient management decisions. A negative result may occur with  improper specimen collection/handling, submission of specimen other than nasopharyngeal swab, presence of viral mutation(s) within the areas targeted by this assay, and inadequate number of viral copies(<138 copies/mL). A negative result must be  combined with clinical observations, patient history, and epidemiological information. The expected result is Negative.  Fact Sheet for Patients:  EntrepreneurPulse.com.au  Fact Sheet for Healthcare Providers:  IncredibleEmployment.be  This test is no t yet approved  or cleared by the [place] and  has been authorized for detection and/or diagnosis of SARS-CoV-2 by FDA under an Emergency Use Authorization (EUA). This EUA will remain  in effect (meaning this test can be used) for the duration of the COVID-19 declaration under Section 564(b)(1) of the Act, 21 U.S.C.section 360bbb-3(b)(1), unless the authorization is terminated  or revoked sooner.       Influenza A by PCR NEGATIVE NEGATIVE Final   Influenza B by PCR NEGATIVE NEGATIVE Final    Comment: (NOTE) The Xpert Xpress SARS-CoV-2/FLU/RSV plus assay is intended as an aid in the diagnosis of influenza from Nasopharyngeal swab specimens and should not be used as a sole basis for treatment. Nasal washings and aspirates are unacceptable for Xpert Xpress SARS-CoV-2/FLU/RSV testing.  Fact Sheet for Patients: EntrepreneurPulse.com.au  Fact Sheet for Healthcare Providers: IncredibleEmployment.be  This test is not yet approved or cleared by the [place] FDA and has been authorized for detection and/or diagnosis of SARS-CoV-2 by FDA under an Emergency Use Authorization (EUA). This EUA will remain in effect (meaning this test can be used) for the duration of the COVID-19 declaration under Section 564(b)(1) of the Act, 21 U.S.C. section 360bbb-3(b)(1), unless the authorization is terminated or revoked.  Performed at [facility], [place] [address] [place], [place] [address]   MRSA PCR Screening     Status: None   Collection Time: 09/30/20  1:07 AM   Specimen: Nasal Mucosa; Nasopharyngeal  Result Value Ref Range Status   MRSA by PCR NEGATIVE  NEGATIVE Final    Comment:        The GeneXpert MRSA Assay (FDA approved for NASAL specimens only), is one component of a comprehensive MRSA colonization surveillance program. It is not intended to diagnose MRSA infection nor to guide or monitor treatment for MRSA infections. Performed at [facility], [place] [address] [place], [place] [address]          Radiology Studies: CT HEAD WO CONTRAST  Result Date: 10/02/2020 CLINICAL DATA:  Brain mass.  Preoperative planning. EXAM: CT HEAD WITHOUT CONTRAST TECHNIQUE: Contiguous axial images were obtained from the base of the skull through the vertex without intravenous contrast. COMPARISON:  MRI head 09/30/2020.  CT head 10/01/2020 FINDINGS: Brain: BrainLAB protocol for preoperative planning purposes. Large areas of edema in the left frontal white matter and left temporoparietal white matter appear unchanged from recent studies. Prior MRI demonstrated associated enhancing masses. Masses are difficult to see on unenhanced CT. There is mass-effect on the left cerebral hemisphere. 5 mm midline shift to the right similar to slightly improved from prior studies. No hydrocephalus identified.  No acute hemorrhage identified. Vascular: Negative for hyperdense vessel Skull: No focal lesion. Sinuses/Orbits: Mucosal edema paranasal sinuses. Bony thickening of the maxillary sinus bilaterally. Negative orbit Other: None IMPRESSION: Large areas of edema in the left frontal lobe and left temporoparietal lobe unchanged. Associated areas of enhancement noted on MRI. 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 No acute hemorrhage. 5 mm midline shift to the right similar to slightly improved. Electronically Signed   By: Franchot Gallo M.D.   On: 10/02/2020 09:41   CT HEAD WO CONTRAST  Result Date: 10/01/2020 CLINICAL DATA:  Brain mass EXAM: CT HEAD WITHOUT CONTRAST TECHNIQUE: Contiguous axial images were obtained from the base  of the skull through the vertex without intravenous contrast. COMPARISON:  Brain MRI 09/30/2020 FINDINGS: Brain: Large areas of vasogenic edema in the left temporal  lobe and left frontal lobe are unchanged. There is rightward midline shift 6 mm. No hydrocephalus. No acute hemorrhage. Vascular: No hyperdense vessel or unexpected calcification. Skull: Normal. Negative for fracture or focal lesion. Sinuses/Orbits: Right frontal sinus opacification Other: None. IMPRESSION: Unchanged appearance of large areas of vasogenic edema in the left temporal lobe and left frontal lobe with 6 mm rightward midline shift. Electronically Signed   By: Ulyses Jarred M.D.   On: 10/01/2020 23:09   MR PELVIS W WO CONTRAST  Result Date: 10/02/2020 CLINICAL DATA:  Cancer of unknown primary. EXAM: MRI ABDOMEN AND PELVIS WITHOUT AND WITH CONTRAST TECHNIQUE: Multiplanar multisequence MR imaging of the abdomen and pelvis was performed both before and after the administration of intravenous contrast. CONTRAST:  80mL GADAVIST GADOBUTROL 1 MMOL/ML IV SOLN COMPARISON:  CT chest, abdomen and pelvis of September 29, 2020 FINDINGS: COMBINED FINDINGS FOR BOTH MR ABDOMEN AND PELVIS Lower chest: Please see dedicated CT of the chest from April 18th. No effusion or consolidation. Limited assessment of the lung bases on MRI. Heart size is enlarged. Hepatobiliary: Marked decreased T2 signal within the hepatic parenchyma in this patient who received a dose of Feraheme on 09/30/2020 which limits the evaluation in general. No visible lesion, biliary ductal dilation or pericholecystic stranding. Pancreas: Pancreas grossly normal without ductal dilation or sign of adjacent inflammation. Spleen: Spleen with marked reduction in signal on T2 weighted images, normal size. Adrenals/Urinary Tract: Adrenal glands are unremarkable. Mild fullness of collecting systems and ureters is similar to the previous CT evaluation with mild RIGHT hydronephrosis perhaps related to  compression of the distal ureters secondary to mass in the pelvis. Smooth renal contours. No visible renal lesion. Stomach/Bowel: Limited assessment of the gastrointestinal tract on MRI confounded by presence of susceptibility artifact from gas and gross patient and respiratory motion. Vascular/Lymphatic: Vascular structures in the abdomen are patent. The IVC is engorged in the setting of RIGHT heart enlargement. No aneurysmal dilation of the abdominal aorta. No adenopathy visible in the abdomen or in the pelvis. Limited assessment due to motion artifact and pre-existing vascular signal related to [facility] administration. No pelvic adenopathy. Reproductive: Bulky enlargement of the uterus. Numerous uterine masses. Endometrium not assessed. Adjacent urinary bladder is collapsed due to mass effect. Distal RIGHT ureter is mildly dilated as is the distal LEFT ureter due to mass effect. Uterus measuring 16 x 9 x 11 cm. Heterogeneous mass in the lower uterine segment is the largest mass in the uterus measuring 10 x 10 x 9.6 cm. Other small masses with predominantly low T2 signal and variable enhancement are noted. The dominant uterine mass displays enhancement from baseline signal with heterogeneous pattern. Examination limited by motion. Other: RIGHT paramidline pelvic floor/Peri coccygeal and perirectal cystic area measuring 5.3 x 3.3 cm with mildly thickened wall. Baseline T1 signal, hypointense. Baseline T2 signal with thin rim of low signal and evidence of septation. It is incompletely imaged at its lower margin on post-contrast images in appears to be below the pubococcygeal muscle abutting the gluteal muscle and the sacrum and coccyx as well as the rectum, perhaps insinuated between pelvic floor musculature. No ascites. Musculoskeletal: Marked diminished T1 and T2 signal within visualized skeletal structures compatible with iron deposition in marrow spaces in this patient with recent exogenous iron administration.  No visible lesion. IMPRESSION: 1. Bulky enlargement of the uterus with multiple uterine masses, the largest of which measures 10 x 10 x 9.6 cm. Given the size of the dominant lesion and the heterogeneity  leiomyosarcoma is a differential consideration though no definitive findings of leiomyosarcoma are present. 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 2. RIGHT paramidline pelvic floor/Peri coccygeal and perirectal cystic area measuring 5.3 x 3.3 cm with mildly thickened wall. It is incompletely imaged at its lower margin on post-contrast images and appears to be below the pubococcygeal muscle and perhaps insinuated between pelvic floor musculature. This may represent a tail gut cyst. The possibility of complication by infection is considered given the mildly thickened wall with enhancement. No nodularity. 3. Mild fullness of the collecting systems and ureters is similar to the previous CT evaluation with mild RIGHT hydronephrosis perhaps related to compression of the distal ureters secondary to mass effect. 4. 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 This does limit assessment. 5. Cardiomegaly. Electronically Signed   By: Zetta Bills M.D.   On: 10/02/2020 08:26   MR ABDOMEN W WO CONTRAST  Result Date: 10/02/2020 CLINICAL DATA:  Cancer of unknown primary. EXAM: MRI ABDOMEN AND PELVIS WITHOUT AND WITH CONTRAST TECHNIQUE: Multiplanar multisequence MR imaging of the abdomen and pelvis was performed both before and after the administration of intravenous contrast. CONTRAST:  40mL GADAVIST GADOBUTROL 1 MMOL/ML IV SOLN COMPARISON:  CT chest, abdomen and pelvis of September 29, 2020 FINDINGS: COMBINED FINDINGS FOR BOTH MR ABDOMEN AND PELVIS Lower chest: Please see dedicated CT of the chest from April 18th. No effusion or consolidation. Limited assessment of the lung bases on MRI. Heart size is enlarged.  Hepatobiliary: Marked decreased T2 signal within the hepatic parenchyma in this patient who received a dose of Feraheme on 09/30/2020 which limits the evaluation in general. No visible lesion, biliary ductal dilation or pericholecystic stranding. Pancreas: Pancreas grossly normal without ductal dilation or sign of adjacent inflammation. Spleen: Spleen with marked reduction in signal on T2 weighted images, normal size. Adrenals/Urinary Tract: Adrenal glands are unremarkable. Mild fullness of collecting systems and ureters is similar to the previous CT evaluation with mild RIGHT hydronephrosis perhaps related to compression of the distal ureters secondary to mass in the pelvis. Smooth renal contours. No visible renal lesion. Stomach/Bowel: Limited assessment of the gastrointestinal tract on MRI confounded by presence of susceptibility artifact from gas and gross patient and respiratory motion. Vascular/Lymphatic: Vascular structures in the abdomen are patent. The IVC is engorged in the setting of RIGHT heart enlargement. No aneurysmal dilation of the abdominal aorta. No adenopathy visible in the abdomen or in the pelvis. Limited assessment due to motion artifact and pre-existing vascular signal related to [facility] administration. No pelvic adenopathy. Reproductive: Bulky enlargement of the uterus. Numerous uterine masses. Endometrium not assessed. Adjacent urinary bladder is collapsed due to mass effect. Distal RIGHT ureter is mildly dilated as is the distal LEFT ureter due to mass effect. Uterus measuring 16 x 9 x 11 cm. Heterogeneous mass in the lower uterine segment is the largest mass in the uterus measuring 10 x 10 x 9.6 cm. Other small masses with predominantly low T2 signal and variable enhancement are noted. The dominant uterine mass displays enhancement from baseline signal with heterogeneous pattern. Examination limited by motion. Other: RIGHT paramidline pelvic floor/Peri coccygeal and perirectal cystic  area measuring 5.3 x 3.3 cm with mildly thickened wall. Baseline T1 signal, hypointense. Baseline T2 signal with thin rim of low signal and evidence of septation. It is incompletely imaged at its lower margin on post-contrast images in appears to be below the pubococcygeal muscle abutting the  gluteal muscle and the sacrum and coccyx as well as the rectum, perhaps insinuated between pelvic floor musculature. No ascites. Musculoskeletal: Marked diminished T1 and T2 signal within visualized skeletal structures compatible with iron deposition in marrow spaces in this patient with recent exogenous iron administration. No visible lesion. IMPRESSION: 1. Bulky enlargement of the uterus with multiple uterine masses, the largest of which measures 10 x 10 x 9.6 cm. Given the size of the dominant lesion and the heterogeneity leiomyosarcoma is a differential consideration though no definitive findings of leiomyosarcoma are present. 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 2. RIGHT paramidline pelvic floor/Peri coccygeal and perirectal cystic area measuring 5.3 x 3.3 cm with mildly thickened wall. It is incompletely imaged at its lower margin on post-contrast images and appears to be below the pubococcygeal muscle and perhaps insinuated between pelvic floor musculature. This may represent a tail gut cyst. The possibility of complication by infection is considered given the mildly thickened wall with enhancement. No nodularity. 3. Mild fullness of the collecting systems and ureters is similar to the previous CT evaluation with mild RIGHT hydronephrosis perhaps related to compression of the distal ureters secondary to mass effect. 4. 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 This does limit assessment. 5. Cardiomegaly. Electronically Signed   By: Zetta Bills M.D.   On: 10/02/2020 08:26   Overnight EEG with  video  Result Date: 10/01/2020 Lora Havens, MD     10/02/2020  8:47 AM Patient Name: Candace Jones MRN: [id number] Epilepsy Attending: Lora Havens Referring Physician/Provider:  Myra Rude, NP Duration: 09/30/2020 1406 to 10/01/2020 1448  Patient history: 40 year old female presented with sudden onset of aphasia right facial droop and right-sided weakness.  CT head showed possible left-sided brain mass with edema.  EEG to evaluate for seizures.  Level of alertness: Awake, asleep  AEDs during EEG study: Keppra  Technical aspects: This EEG study was done with scalp electrodes positioned according to the 10-20 International system of electrode placement. Electrical activity was acquired at a sampling rate of 500Hz  and reviewed with a high frequency filter of 70Hz  and a low frequency filter of 1Hz . EEG data were recorded continuously and digitally stored.  Description:  The posterior dominant rhythm consists of 9 Hz activity of moderate voltage (25-35 uV) seen predominantly in posterior head regions, symmetric and reactive to eye opening and eye closing.   Sleep was characterized by vertex waves, sleep spindles (12 to 14 Hz), maximal frontocentral region.  EEG also showed near continuous rhythmic amplitude 2 to 3 Hz delta slowing in left temporal region. Additionally there is also continuous generalized polymorphic mixed frequencies with predominantly 5 to 8 Hz theta-alpha activity. Hyperventilation and photic stimulation were not performed.    ABNORMALITY - Lateralized rhythmic delta activity, left temporal region - Continuous slow, generalized  IMPRESSION: This study is suggestive of cortical dysfunction in left temporal region likely secondary underlying structural abnormality, ictal, postictal state.  Additionally there is evidence of moderate diffuse encephalopathy, nonspecific etiology.  No seizures or epileptiform discharges were seen throughout the recording.  Priyanka Barbra Sarks         Scheduled Meds: . sodium chloride   Intravenous Once  . Chlorhexidine Gluconate Cloth  6 each Topical Daily  . dexamethasone (DECADRON) injection  6 mg Intravenous Q6H   Followed by  . dexamethasone (DECADRON) injection  4 mg Intravenous Q6H   Followed by  . [START  ON 10/04/2020] dexamethasone (DECADRON) injection  4 mg Intravenous Q8H  . dexamethasone (DECADRON) injection  6 mg Intravenous Q6H  . docusate sodium  100 mg Oral BID  . feeding supplement  237 mL Oral BID BM  . ferrous sulfate  325 mg Oral Q breakfast  . nystatin  5 mL Oral QID  . pantoprazole (PROTONIX) IV  40 mg Intravenous QHS  . sodium chloride flush  3 mL Intravenous Once   Continuous Infusions: . 0.9 % NaCl with KCl 20 mEq / L 75 mL/hr at 10/03/20 0754  . levETIRAcetam 750 mg (10/03/20 0801)  . levETIRAcetam Stopped (10/03/20 0449)     LOS: 3 days    Time spent: 35 mins,More than 50% of that time was spent in counseling and/or coordination of care.      Shelly Coss, MD [facility] P4/22/2022, 8:02 AM

## 2020-10-03 NOTE — Progress Notes (Signed)
  Speech Language Pathology Treatment: Dysphagia  Patient Details Name: Candace Jones MRN: [id number] DOB: 01/24/1981 Today's Date: 10/03/2020 Time: 3846-6599 SLP Time Calculation (min) (ACUTE ONLY): 24 min  Assessment / Plan / Recommendation Clinical Impression  Pt was seen for dysphagia treatment with her sister present. She was alert and cooperative and her sister was present. Pt's RN reported that pt has been tolerating the current diet without overt s/sx of aspiration. [employer] [place] interpreter, Evette Doffing (ID# [phone]), was used for translation. However, pt exhibited difficulty understanding some translated messages and those were then repeated by her sister. Pt was seen during lunch and consumed a meal of ground tilapia, broccoli, mashed potatoes, ice cream, and thin liquids. Pt demonstrated residue in the right lateral sulcus which was cleared with a liquid wash. Pt was educated and cued for use of a lingual sweep, but was unable to do this despite modeling, tactile cues, or verbal prompts. Anterior spillage to the right was noted with thin liquids via cup. Pt demonstrated coughing with thin liquids via straw, but tolerated solids and thin liquids via cup without overt s/sx of aspiration. It is recommended that her current diet be continued with observance of swallowing precautions. SLP will continue to follow pt.    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 She was loaded with Keppra on presentation. 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 Pt found to have uterine masses and cystic area near the rectum. Pt s/p left posterior temporal craniectomy for open brain biopsy and cultures 4/21. CT head 4/21: Large areas of edema in the left frontal  lobe and left temporoparietal lobe unchanged. Findings thought to be compatible wth vasogenic edema most likely from metastatic disease.      SLP Plan  Continue with current plan of care       Recommendations  Diet recommendations: Dysphagia 2 (fine chop);Thin liquid Liquids provided via: Cup;No straw Medication Administration: Crushed with puree Supervision: Full supervision/cueing for compensatory strategies Compensations: Minimize environmental distractions;Slow rate;Small sips/bites;Monitor for anterior loss;Other (Comment);Follow solids with liquid (monitor for pocketing) Postural Changes and/or Swallow Maneuvers: Seated upright 90 degrees                Oral Care Recommendations: Oral care BID Follow up Recommendations:  (tba) SLP Visit Diagnosis: Dysphagia, unspecified (R13.10) Plan: Continue with current plan of care       Michaiah Maiden I. Hardin Negus, [place], [place] Office number [phone] Pager [place] 10/03/2020, 3:42 PM

## 2020-10-03 NOTE — Progress Notes (Addendum)
      INFECTIOUS DISEASE ATTENDING ADDENDUM:   Date: 10/03/2020  Patient name: Candace Jones  Medical record number: [id number]  Date of birth: 06/28/1980   I visited the pathology apartment at roughly 10 till 5 but it was already closed.  I do not know if any the special stains have been looked at yet but I put in a call to Dr. Tresa Moore.  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  I was able to speak with Dr. Tresa Moore special stains not ready yet.  I will start IV bactrim for now.      Alcide Evener 10/03/2020, 4:58 PM

## 2020-10-04 DIAGNOSIS — R21 Rash and other nonspecific skin eruption: Secondary | ICD-10-CM

## 2020-10-04 NOTE — Evaluation (Signed)
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 She currently is at a setup/S level for all basic ADLs. We will continue to follow.    Follow Up Recommendations  No OT follow up    Equipment Recommendations  None recommended by OT       Precautions / Restrictions Precautions Precautions: None Restrictions Weight Bearing Restrictions: No      Mobility Bed Mobility Overal bed mobility: Independent                  Transfers Overall transfer level: Independent Equipment used: None                  Balance Overall balance assessment: Mild deficits observed, not formally tested                                         ADL either performed or assessed with clinical judgement   ADL Overall ADL's : Needs assistance/impaired Eating/Feeding: Independent;Sitting   Grooming: Supervision/safety;Set up;Standing   Upper Body Bathing: Supervision/ safety;Set up;Standing   Lower Body Bathing: Supervison/ safety;Set up;Sit to/from stand   Upper Body Dressing : Supervision/safety;Set up;Sitting   Lower Body Dressing: Supervision/safety;Set up;Sit to/from stand   Toilet Transfer: Supervision/safety;Ambulation   Toileting- Clothing Manipulation and Hygiene: Supervision/safety;Sit to/from stand          General ADL Comments: Spoke with her two sisters in room and made them aware that I would not recommended driving or cooking right now (feel she functionally cabable but not sure about cognitively safe)     Vision Baseline Vision/History: No visual deficits Patient Visual Report: No change from baseline Vision Assessment?: Yes Eye Alignment: Within Functional Limits Ocular Range of Motion: Within Functional Limits Alignment/Gaze Preference: Within Defined Limits Tracking/Visual Pursuits: Able to track stimulus in all quads without difficulty Convergence: Within functional limits Visual Fields: No apparent deficits            Pertinent Vitals/Pain Pain Assessment: No/denies pain     Hand Dominance Right   Extremity/Trunk Assessment Upper Extremity Assessment Upper Extremity Assessment: Overall WFL for tasks assessed   Lower Extremity Assessment Lower Extremity Assessment: Overall WFL for tasks assessed   Cervical / Trunk Assessment Cervical / Trunk Assessment: Normal   Communication Communication Communication: Prefers language other than English ([place])   Cognition Arousal/Alertness: Awake/alert Behavior During Therapy: WFL for tasks assessed/performed Overall Cognitive Status: Impaired/Different from baseline Area of Impairment: Orientation                               General Comments: Difficulty with recalling parts of the date, delay with wll of them (month, year, day), perseverates at times; per family prior to her fall she did not have any issues with orientation. Pt looking at calendar in  room to try and help her but she still was having trouble.              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      Prior Functioning/Environment Level of Independence: Independent                 OT Problem List: Impaired balance (sitting and/or standing);Decreased cognition      OT Treatment/Interventions: Self-care/ADL training;Patient/family education;Balance training;Cognitive remediation/compensation    OT Goals(Current goals can be found in the care plan section) Acute Rehab OT Goals Patient Stated Goal: to go home OT Goal Formulation: With patient Time For Goal Achievement: 10/18/20 Potential to Achieve Goals: Good  OT Frequency: Min 2X/week              AM-PAC OT "6 Clicks" Daily Activity     Outcome Measure Help from another person eating meals?: None Help from another person taking care of personal grooming?: A Little Help from another person toileting, which includes using toliet, bedpan, or urinal?: A Little Help from another person bathing (including washing, rinsing, drying)?: A Little Help from another person to put on and taking off regular upper body clothing?: A Little Help from another person to put on and taking off regular lower body clothing?: A Little 6 Click Score: 19   End of Session Nurse Communication:  (pt with difficulty with month, year, day (family reports this is new since her fall))  Activity Tolerance: Patient tolerated treatment well Patient left: in chair;with call bell/phone within reach;with chair alarm set  OT Visit Diagnosis: Unsteadiness on feet (R26.81);Other symptoms and signs involving cognitive function                Time: 8502-7741 OT Time Calculation (min): 42 min Charges:  OT General Charges $OT Visit: 1 Visit OT Evaluation $OT Eval Moderate Complexity: 1 Mod OT Treatments $Self Care/Home Management : 8-22 mins  [address], OTR/L Acute [employer] Pager [phone] Office  [phone]     Almon Register 10/04/2020, 11:50 AM

## 2020-10-04 NOTE — Progress Notes (Signed)
PROGRESS NOTE    Candace Jones  VOZ:[id number] DOB: Sep 03, 1980 DOA: 09/29/2020 PCP: Patient, No Pcp Per (Inactive)   Chief Complain: Aphasia, right-sided weakness  Brief Narrative: Patient is a 40 year old female from [place] with no significant past medical history was brought to the emergency department with complaints of aphasia, right facial droop, right-sided weakness.  She was also found to be confused and code stroke was called in the emergency department.  She was also complaining of mild headache since last few days, also complained of severe menorrhagia.  She also had a generalized tonic-clonic seizure in the emergency department.  CT head done in the emergency department showed possible left-sided brain mass with edema. 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   metastatic process.  Neurosurgery, neurology consulted. She was loaded with Keppra on presentation.  Lab work also showed hemoglobin of 6, transfused with PRBC. She underwent open brain biopsy by neurosurgery on 10/02/2020.  Pending biopsy report  Assessment & Plan:   Principal Problem:   Intracranial mass Active Problems:   Seizure (HCC)   Microcytic hypochromic anemia   Brain mass   Ankle pain   AIDS (acquired immune deficiency syndrome) (HCC)   Uterine mass   Perirectal abscess   Brain tumor ([place])   MRI contraindicated due to metal implant   Intracranial mass:  CT head done in the emergency department showed possible left-sided brain mass with edema. 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    Neurosurgery, neurology consulted.  She was loaded with Keppra on presentation.  Also started on Decadron. Lumbar puncture not possible due to possible increased intracranial pressure/midline shift. She underwent open brain biopsy by neurosurgery on 10/02/2020.   Pending biopsy report: Follow-up toxoplasma, fungus, cryptococcus, AFB, culture, pathology. Cystic lesions suspicious for toxoplasma, but cannot rule out malignancy.  Toxoplasma IgG positive.  Started on IV Bactrim.  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  Toxoplasma IgG  Positive,IgM negative. Negative  QuantiFERON.reactive RPR,negative  cryptococcus.  ID following. She was complaining of blurry vision earlier but now is saying that her vision is clear.  She also has a diffuse maculopapular skin lesions, but these lesions are healing, do not look active, patient complains of some itching.  Bradycardia: Patient persistently bradycardic with heart rate in the range of 40-60.  She is asymptomatic. echocardiogram showed ejection fraction of 55 to 60%, no other abnormalities.  Monitor on telemetry.  Denies any lightheadedness or dizziness  Seizure: Had an episode of tonic-clonic seizure in the emergency department most likely acid with the brain mass/cystic lesion.  Neurology was following.  EEG showed cortical dysfunction in the left temporal region, moderate diffuse encephalopathy, no seizures or epileptiform discharges.  Continue Keppra 750 mg twice a day.  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   MRI abd/pelvis showed bulky enlargement of the uterus with multiple uterine masses, the largest of which measures 10 x 10 x 9.6 cm with suspicion for leiomyosarcoma.Also showed  paramidline pelvic floor/Peri coccygeal and perirectal cystic area measuring 5.3 x 3.3 cm with mildly thickened wall,possible  tail gut cyst.  She denies any rectal pain or rectal bleeding or discomfort. We discussed with oncology about this finding.  We will follow-up pathology  from the brain biopsy.  Acute metabolic encephalopathy: Likely precipitated by  seizure.  Currently  alert and oriented   microcytic hypochromic anemia: No previous labs to compare.  Was having heavy menstrual cycles.  She was transfused with  PRBCs during this hospitalization.  Monitor CBC.  Hemoglobin currently stable at 8.  Iron studies showed low iron, given a dose of iron infusion.  Continue oral supplementation.  Hypokalemia: Supplemented with potassium.  Debility/deconditioning: PT/OT evaluation requested.  PT did not recommend any follow-up.  Nutrition Problem: Increased nutrient needs Etiology: acute illness      DVT prophylaxis:SCD Code Status: Full Family Communication: Sister at the bedside Status is: Inpatient  Remains inpatient appropriate because:Inpatient level of care appropriate due to severity of illness   Dispo: The patient is from: Home              Anticipated d/c is to: Home              Patient currently is not medically stable to d/c.   Difficult to place patient No     Consultants: Neurology, neurosurgery  Procedures:  Antimicrobials:  Anti-infectives (From admission, onward)   Start     Dose/Rate Route Frequency Ordered Stop   10/03/20 1815  sulfamethoxazole-trimethoprim (BACTRIM) 448.96 mg in dextrose 5 % 500 mL IVPB        10 mg/kg/day  89.8 kg 352 mL/hr over 90 Minutes Intravenous Every 12 hours 10/03/20 1725     10/02/20 1630  ceFAZolin (ANCEF) IVPB 2g/100 mL premix        2 g 200 mL/hr over 30 Minutes Intravenous Every 8 hours 10/02/20 1533 10/03/20 0051   10/02/20 1038  ceFAZolin (ANCEF) 2-4 GM/100ML-% IVPB       Note to Pharmacy: Laurita Quint   : cabinet override      10/02/20 1038 10/02/20 2244      Subjective: Patient seen and examined the bedside this morning.  Hemodynamically stable.  Comfortable.  Bradycardia has improved.  Denies any complaints today.  Objective: Vitals:   10/04/20 0300 10/04/20 0400 10/04/20 0500 10/04/20 0600  BP: (!) 128/55 126/75 139/72 (!) 146/94  Pulse: (!) 49 (!) 51 (!) 46  (!) 54  Resp: 16 15 16 17   Temp:  97.8 F (36.6 C)    TempSrc:  Oral    SpO2: 100% 100% 100% 100%  Weight:        Intake/Output Summary (Last 24 hours) at 10/04/2020 0806 Last data filed at 10/04/2020 0400 Gross per 24 hour  Intake 2117.04 ml  Output --  Net 2117.04 ml   Filed Weights   09/29/20 1925  Weight: 89.8 kg    Examination:  General exam: Overall comfortable, not in distress HEENT: PERRL, surgical wound on the scalp with sutures Respiratory system:  no wheezes or crackles  Cardiovascular system: S1 & S2 heard, RRR.  Gastrointestinal system: Abdomen is nondistended, soft and nontender. Central nervous system: Alert and oriented Extremities: No edema, no clubbing ,no cyanosis Skin: Healing/dry maculopapular rash on the face, trunk and upper extremities   Data Reviewed: I have personally reviewed following labs and imaging studies  CBC: Recent Labs  Lab 09/29/20 1814 09/29/20 1822 09/30/20 0555 10/01/20 0744 10/02/20 0456 10/02/20 1117 10/03/20 0420  WBC 4.1  --  3.1* 4.9 4.4  --  7.2  NEUTROABS 1.8  --  2.0 3.1 3.4  --  5.8  HGB 6.6*   < > 7.1* 7.3* 7.1* 9.2* 8.1*  HCT 24.3*   < >  25.5* 25.7* 25.5* 27.0* 28.0*  MCV 61.5*  --  63.1* 62.5* 64.6*  --  65.9*  PLT 213  --  183 219 196  --  187   < > = values in this interval not displayed.   Basic Metabolic Panel: Recent Labs  Lab 09/29/20 1814 09/29/20 1822 09/30/20 0555 10/01/20 0744 10/01/20 2222 10/02/20 0456 10/02/20 1117 10/03/20 0420  NA 133* 139 136 137  --  136 140 138  K 3.5 3.5 3.8 3.3*  --  4.4 3.9 4.0  CL 105 106 109 108  --  109  --  109  CO2 19*  --  20* 24  --  21*  --  21*  GLUCOSE 114* 111* 104* 99  --  115*  --  111*  BUN 10 10 6 11   --  12  --  15  CREATININE 0.57 0.50 0.48 0.58  --  0.49  --  0.59  CALCIUM 8.7*  --  8.5* 8.8*  --  8.8*  --  8.4*  MG  --   --   --   --  2.2  --   --   --   PHOS  --   --   --   --  3.6  --   --   --    GFR: CrCl cannot be calculated  (Unknown ideal weight.). Liver Function Tests: Recent Labs  Lab 09/29/20 1814 09/30/20 0555  AST 41 33  ALT 27 24  ALKPHOS 66 60  BILITOT 0.6 0.7  PROT 9.7* 9.0*  ALBUMIN 3.6 3.2*   No results for input(s): LIPASE, AMYLASE in the last 168 hours. No results for input(s): AMMONIA in the last 168 hours. Coagulation Profile: Recent Labs  Lab 09/29/20 1814  INR 1.1   Cardiac Enzymes: No results for input(s): CKTOTAL, CKMB, CKMBINDEX, TROPONINI in the last 168 hours. BNP (last 3 results) No results for input(s): PROBNP in the last 8760 hours. HbA1C: No results for input(s): HGBA1C in the last 72 hours. CBG: Recent Labs  Lab 10/02/20 1559 10/02/20 2333 10/03/20 1228 10/03/20 1744 10/03/20 2324  GLUCAP 115* 127* 106* 113* 129*   Lipid Profile: No results for input(s): CHOL, HDL, LDLCALC, TRIG, CHOLHDL, LDLDIRECT in the last 72 hours. Thyroid Function Tests: No results for input(s): TSH, T4TOTAL, FREET4, T3FREE, THYROIDAB in the last 72 hours. Anemia Panel: No results for input(s): VITAMINB12, FOLATE, FERRITIN, TIBC, IRON, RETICCTPCT in the last 72 hours. Sepsis Labs: No results for input(s): PROCALCITON, LATICACIDVEN in the last 168 hours.  Recent Results (from the past 240 hour(s))  Resp Panel by RT-PCR (Flu A&B, Covid) Nasopharyngeal Swab     Status: None   Collection Time: 09/29/20 10:45 PM   Specimen: Nasopharyngeal Swab; Nasopharyngeal(NP) swabs in vial transport medium  Result Value Ref Range Status   SARS Coronavirus 2 by RT PCR NEGATIVE NEGATIVE Final    Comment: (NOTE) SARS-CoV-2 target nucleic acids are NOT DETECTED.  The SARS-CoV-2 RNA is generally detectable in upper respiratory specimens during the acute phase of infection. The lowest concentration of SARS-CoV-2 viral copies this assay can detect is 138 copies/mL. A negative result does not preclude SARS-Cov-2 infection and should not be used as the sole basis for treatment or other patient management  decisions. A negative result may occur with  improper specimen collection/handling, submission of specimen other than nasopharyngeal swab, presence of viral mutation(s) within the areas targeted by this assay, and inadequate number of viral copies(<138 copies/mL). A  negative result must be combined with clinical observations, patient history, and epidemiological information. The expected result is Negative.  Fact Sheet for Patients:  EntrepreneurPulse.com.au  Fact Sheet for Healthcare Providers:  IncredibleEmployment.be  This test is no t yet approved or cleared by the [place] FDA and  has been authorized for detection and/or diagnosis of SARS-CoV-2 by FDA under an Emergency Use Authorization (EUA). This EUA will remain  in effect (meaning this test can be used) for the duration of the COVID-19 declaration under Section 564(b)(1) of the Act, 21 U.S.C.section 360bbb-3(b)(1), unless the authorization is terminated  or revoked sooner.       Influenza A by PCR NEGATIVE NEGATIVE Final   Influenza B by PCR NEGATIVE NEGATIVE Final    Comment: (NOTE) The Xpert Xpress SARS-CoV-2/FLU/RSV plus assay is intended as an aid in the diagnosis of influenza from Nasopharyngeal swab specimens and should not be used as a sole basis for treatment. Nasal washings and aspirates are unacceptable for Xpert Xpress SARS-CoV-2/FLU/RSV testing.  Fact Sheet for Patients: EntrepreneurPulse.com.au  Fact Sheet for Healthcare Providers: IncredibleEmployment.be  This test is not yet approved or cleared by the [place] FDA and has been authorized for detection and/or diagnosis of SARS-CoV-2 by FDA under an Emergency Use Authorization (EUA). This EUA will remain in effect (meaning this test can be used) for the duration of the COVID-19 declaration under Section 564(b)(1) of the Act, 21 U.S.C. section 360bbb-3(b)(1), unless the  authorization is terminated or revoked.  Performed at [facility], [place] [address], [place], [place] [address]   MRSA PCR Screening     Status: None   Collection Time: 09/30/20  1:07 AM   Specimen: Nasal Mucosa; Nasopharyngeal  Result Value Ref Range Status   MRSA by PCR NEGATIVE NEGATIVE Final    Comment:        The GeneXpert MRSA Assay (FDA approved for NASAL specimens only), is one component of a comprehensive MRSA colonization surveillance program. It is not intended to diagnose MRSA infection nor to guide or monitor treatment for MRSA infections. Performed at [facility], [place] [address], [place], [place] [address]   Culture, fungus without smear     Status: None (Preliminary result)   Collection Time: 10/02/20 12:36 PM   Specimen: PATH Other; Tissue  Result Value Ref Range Status   Specimen Description BIOPSY  Final   Special Requests LEFT POSTERIOR BRAIN  Final   Culture   Final    NO FUNGUS ISOLATED AFTER 1 DAY Performed at [facility], [place] [address] [place], [place] [address]    Report Status PENDING  Incomplete         Radiology Studies: CT HEAD WO CONTRAST  Result Date: 10/02/2020 CLINICAL DATA:  Brain mass.  Preoperative planning. EXAM: CT HEAD WITHOUT CONTRAST TECHNIQUE: Contiguous axial images were obtained from the base of the skull through the vertex without intravenous contrast. COMPARISON:  MRI head 09/30/2020.  CT head 10/01/2020 FINDINGS: Brain: BrainLAB protocol for preoperative planning purposes. Large areas of edema in the left frontal white matter and left temporoparietal white matter appear unchanged from recent studies. Prior MRI demonstrated associated enhancing masses. Masses are difficult to see on unenhanced CT. There is mass-effect on the left cerebral hemisphere. 5 mm midline shift to the right similar to slightly improved from prior studies. No hydrocephalus identified.  No acute hemorrhage identified. Vascular: Negative  for hyperdense vessel Skull: No focal lesion. Sinuses/Orbits: Mucosal edema paranasal sinuses.  Bony thickening of the maxillary sinus bilaterally. Negative orbit Other: None IMPRESSION: Large areas of edema in the left frontal lobe and left temporoparietal lobe unchanged. Associated areas of enhancement noted on MRI. 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 No acute hemorrhage. 5 mm midline shift to the right similar to slightly improved. Electronically Signed   By: Franchot Gallo M.D.   On: 10/02/2020 09:41   ECHOCARDIOGRAM COMPLETE  Result Date: 10/03/2020    ECHOCARDIOGRAM REPORT   Patient Name:   SHARIEKA MUIRHEAD Date of Exam: 10/03/2020 Medical Rec #:  [id number]        Height:       68.1 in Accession #:    [id number]       Weight:       198.0 lb Date of Birth:  [birth year]       BSA:          2.037 m Patient Age:    23 years         BP:           139/109 mmHg Patient Gender: F                HR:           44 bpm. Exam Location:  Inpatient Procedure: 2D Echo Indications:    abnormal ecg  History:        Patient has no prior history of Echocardiogram examinations.                 Brain tumor.  Sonographer:    Johny Chess Referring Phys: [id number] Aaliah Jorgenson IMPRESSIONS  1. Left ventricular ejection fraction, by estimation, is 55 to 60%. The left ventricle has normal function. The left ventricle has no regional wall motion abnormalities. Left ventricular diastolic parameters were normal.  2. Right ventricular systolic function is normal. The right ventricular size is normal.  3. The mitral valve is normal in structure. Trivial mitral valve regurgitation. No evidence of mitral stenosis.  4. The aortic valve is normal in structure. Aortic valve regurgitation is not visualized. No aortic stenosis is present.  5. The inferior vena cava is normal in size with greater than 50% respiratory variability, suggesting right atrial pressure of 3 mmHg. FINDINGS  Left Ventricle:  Left ventricular ejection fraction, by estimation, is 55 to 60%. The left ventricle has normal function. The left ventricle has no regional wall motion abnormalities. The left ventricular internal cavity size was normal in size. There is  no left ventricular hypertrophy. Left ventricular diastolic parameters were normal. Right Ventricle: The right ventricular size is normal. No increase in right ventricular wall thickness. Right ventricular systolic function is normal. Left Atrium: Left atrial size was normal in size. Right Atrium: Right atrial size was normal in size. Pericardium: There is no evidence of pericardial effusion. Mitral Valve: The mitral valve is normal in structure. Trivial mitral valve regurgitation. No evidence of mitral valve stenosis. Tricuspid Valve: The tricuspid valve is normal in structure. Tricuspid valve regurgitation is not demonstrated. No evidence of tricuspid stenosis. Aortic Valve: The aortic valve is normal in structure. Aortic valve regurgitation is not visualized. No aortic stenosis is present. Pulmonic Valve: The pulmonic valve was normal in structure. Pulmonic valve regurgitation is trivial. No evidence of pulmonic stenosis. Aorta: The aortic root is normal in size and structure. Venous: The inferior vena cava is normal in size with greater than 50% respiratory variability, suggesting right atrial pressure of 3 mmHg.  IAS/Shunts: No atrial level shunt detected by color flow Doppler.  LEFT VENTRICLE PLAX 2D LVIDd:         4.90 cm  Diastology LVIDs:         3.10 cm  LV e' medial:    10.00 cm/s LV PW:         0.90 cm  LV E/e' medial:  11.2 LV IVS:        0.80 cm  LV e' lateral:   18.50 cm/s LVOT diam:     2.10 cm  LV E/e' lateral: 6.1 LV SV:         104 LV SV Index:   51 LVOT Area:     3.46 cm  RIGHT VENTRICLE             IVC RV S prime:     14.70 cm/s  IVC diam: 1.60 cm TAPSE (M-mode): 3.2 cm LEFT ATRIUM             Index       RIGHT ATRIUM           Index LA diam:        3.90 cm  1.91 cm/m  RA Area:     13.60 cm LA Vol (A2C):   58.6 ml 28.76 ml/m RA Volume:   28.90 ml  14.18 ml/m LA Vol (A4C):   46.5 ml 22.82 ml/m LA Biplane Vol: 56.8 ml 27.88 ml/m  AORTIC VALVE LVOT Vmax:   119.00 cm/s LVOT Vmean:  73.100 cm/s LVOT VTI:    0.299 m  AORTA Ao Root diam: 2.60 cm Ao Asc diam:  3.10 cm MITRAL VALVE MV Area (PHT): 3.99 cm     SHUNTS MV Decel Time: 190 msec     Systemic VTI:  0.30 m MV E velocity: 112.00 cm/s  Systemic Diam: 2.10 cm MV A velocity: 57.50 cm/s MV E/A ratio:  1.95 Glori Bickers MD Electronically signed by Glori Bickers MD Signature Date/Time: 10/03/2020/4:35:51 PM    Final         Scheduled Meds: . sodium chloride   Intravenous Once  . Chlorhexidine Gluconate Cloth  6 each Topical Daily  . dexamethasone (DECADRON) injection  4 mg Intravenous Q6H   Followed by  . dexamethasone (DECADRON) injection  4 mg Intravenous Q8H  . docusate sodium  100 mg Oral BID  . feeding supplement  237 mL Oral BID BM  . ferrous sulfate  325 mg Oral Q breakfast  . nystatin  5 mL Oral QID  . pantoprazole (PROTONIX) IV  40 mg Intravenous QHS  . sodium chloride flush  3 mL Intravenous Once   Continuous Infusions: . levETIRAcetam 750 mg (10/03/20 1919)  . sulfamethoxazole-trimethoprim Stopped (10/03/20 2330)     LOS: 4 days    Time spent: 35 mins,More than 50% of that time was spent in counseling and/or coordination of care.      Shelly Coss, MD [facility] P4/23/2022, 8:06 AM

## 2020-10-04 NOTE — Progress Notes (Signed)
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  These are suspicious for toxoplasmosis and started on IV Bactrim 4/22.  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  -Continue TMP-SMX -Follow-up pathology stains -Close lab monitoring while on IV Bactrim  #Uterine masses and cystic area: GynOnc reviewed films and feel that the area is unlikely to be a leiomyosarcoma and more likely a benign finding.  May need IR guided drainage or biopsy pending radiology review if there is concern for abscess.  Patient currently does not have any complaints regarding this.  #Thrush  -Continue nystatin swish and swallow.  Avoiding systemic antifungals until definitive diagnosis of her CNS process  #Advanced HIV: Delaying treatment for now given her high intracranial pressure and midline shift while awaiting CNS diagnosis.  #Rash: Broad differential as well.  Lesions may appear to be healing not currently looking active, however, will need close monitoring.  RPR reactive with 1: 1 titer but treponemal pallidum antibodies negative indicating a false positive.    Principal Problem:   Intracranial mass Active Problems:   Seizure (HCC)   Microcytic hypochromic anemia   Brain mass   Ankle pain   AIDS (acquired immune deficiency syndrome) (HCC)   Uterine mass   Perirectal abscess   Brain tumor ([place])   MRI contraindicated due to metal implant    MEDICATIONS:    Scheduled Meds: . sodium chloride   Intravenous Once  . Chlorhexidine Gluconate Cloth  6 each Topical Daily  . dexamethasone (DECADRON) injection  4 mg Intravenous Q6H   Followed by  . dexamethasone (DECADRON) injection  4 mg Intravenous Q8H  . docusate  sodium  100 mg Oral BID  . feeding supplement  237 mL Oral BID BM  . ferrous sulfate  325 mg Oral Q breakfast  . nystatin  5 mL Oral QID  . pantoprazole (PROTONIX) IV  40 mg Intravenous QHS  . sodium chloride flush  3 mL Intravenous Once   Continuous Infusions: . levETIRAcetam 750 mg (10/04/20 0829)  . sulfamethoxazole-trimethoprim 448.96 mg (10/04/20 0927)   PRN Meds:.acetaminophen **OR** acetaminophen, acetaminophen **OR** acetaminophen, hydrALAZINE, HYDROcodone-acetaminophen, LORazepam, morphine injection, ondansetron **OR** ondansetron (ZOFRAN) IV, promethazine  SUBJECTIVE:   24 hour events:  No acute events are noted overnight She was started on IV Bactrim overnight No new labs this morning No new imaging this morning Pathology from her brain biopsy 10/02/2020 pending  She has no new complaints this morning she is accompanied by family members in her room.  They were asking about when the results of her biopsy will be available.  Discussed that this will be Monday at the earliest and [name] will be by to see them at that time.  She denies any fevers or chills.  She seems to be tolerating the Bactrim.  She has no headache.  No GI upset.  No urinary symptoms.  Review of Systems  All other systems reviewed and are negative.     OBJECTIVE:   Blood pressure 124/88, pulse (!) 56, temperature 97.8 F (36.6 C), temperature source Oral, resp. rate 17, height 5\' 8"  (1.727 m), weight 89.8 kg, SpO2 100 %. Body mass index  is 30.1 kg/m.  Physical Exam Constitutional:      General: She is not in acute distress.    Appearance: Normal appearance.  Eyes:     Extraocular Movements: Extraocular movements intact.     Conjunctiva/sclera: Conjunctivae normal.  Pulmonary:     Effort: Pulmonary effort is normal. No respiratory distress.  Abdominal:     General: There is no distension.     Palpations: Abdomen is soft.     Tenderness: There is no abdominal tenderness.  Musculoskeletal:      Cervical back: Normal range of motion and neck supple.     Right lower leg: No edema.     Left lower leg: No edema.  Skin:    General: Skin is warm and dry.  Neurological:     General: No focal deficit present.     Mental Status: She is alert.  Psychiatric:        Mood and Affect: Mood normal.        Behavior: Behavior normal.      Lab Results: Lab Results  Component Value Date   WBC 7.2 10/03/2020   HGB 8.1 (L) 10/03/2020   HCT 28.0 (L) 10/03/2020   MCV 65.9 (L) 10/03/2020   PLT 187 10/03/2020    Lab Results  Component Value Date   NA 138 10/03/2020   K 4.0 10/03/2020   CO2 21 (L) 10/03/2020   GLUCOSE 111 (H) 10/03/2020   BUN 15 10/03/2020   CREATININE 0.59 10/03/2020   CALCIUM 8.4 (L) 10/03/2020   GFRNONAA >60 10/03/2020    Lab Results  Component Value Date   ALT 24 09/30/2020   AST 33 09/30/2020   ALKPHOS 60 09/30/2020   BILITOT 0.7 09/30/2020    No results found for: CRP  No results found for: ESRSEDRATE   I have reviewed the micro and lab results in Epic.  Imaging: ECHOCARDIOGRAM COMPLETE  Result Date: 10/03/2020    ECHOCARDIOGRAM REPORT   Patient Name:   Candace Jones Date of Exam: 10/03/2020 Medical Rec #:  [id number]        Height:       68.1 in Accession #:    [id number]       Weight:       198.0 lb Date of Birth:  September 25, 1980       BSA:          2.037 m Patient Age:    40 years         BP:           139/109 mmHg Patient Gender: F                HR:           44 bpm. Exam Location:  Inpatient Procedure: 2D Echo Indications:    abnormal ecg  History:        Patient has no prior history of Echocardiogram examinations.                 Brain tumor.  Sonographer:    Johny Chess Referring Phys: [id number] AMRIT ADHIKARI IMPRESSIONS  1. Left ventricular ejection fraction, by estimation, is 55 to 60%. The left ventricle has normal function. The left ventricle has no regional wall motion abnormalities. Left ventricular diastolic parameters were normal.  2.  Right ventricular systolic function is normal. The right ventricular size is normal.  3. The mitral valve is normal in structure. Trivial mitral valve regurgitation. No evidence of mitral stenosis.  4. The aortic valve is normal in structure. Aortic valve regurgitation is not visualized. No aortic stenosis is present.  5. The inferior vena cava is normal in size with greater than 50% respiratory variability, suggesting right atrial pressure of 3 mmHg. FINDINGS  Left Ventricle: Left ventricular ejection fraction, by estimation, is 55 to 60%. The left ventricle has normal function. The left ventricle has no regional wall motion abnormalities. The left ventricular internal cavity size was normal in size. There is  no left ventricular hypertrophy. Left ventricular diastolic parameters were normal. Right Ventricle: The right ventricular size is normal. No increase in right ventricular wall thickness. Right ventricular systolic function is normal. Left Atrium: Left atrial size was normal in size. Right Atrium: Right atrial size was normal in size. Pericardium: There is no evidence of pericardial effusion. Mitral Valve: The mitral valve is normal in structure. Trivial mitral valve regurgitation. No evidence of mitral valve stenosis. Tricuspid Valve: The tricuspid valve is normal in structure. Tricuspid valve regurgitation is not demonstrated. No evidence of tricuspid stenosis. Aortic Valve: The aortic valve is normal in structure. Aortic valve regurgitation is not visualized. No aortic stenosis is present. Pulmonic Valve: The pulmonic valve was normal in structure. Pulmonic valve regurgitation is trivial. No evidence of pulmonic stenosis. Aorta: The aortic root is normal in size and structure. Venous: The inferior vena cava is normal in size with greater than 50% respiratory variability, suggesting right atrial pressure of 3 mmHg. IAS/Shunts: No atrial level shunt detected by color flow Doppler.  LEFT VENTRICLE PLAX 2D  LVIDd:         4.90 cm  Diastology LVIDs:         3.10 cm  LV e' medial:    10.00 cm/s LV PW:         0.90 cm  LV E/e' medial:  11.2 LV IVS:        0.80 cm  LV e' lateral:   18.50 cm/s LVOT diam:     2.10 cm  LV E/e' lateral: 6.1 LV SV:         104 LV SV Index:   51 LVOT Area:     3.46 cm  RIGHT VENTRICLE             IVC RV S prime:     14.70 cm/s  IVC diam: 1.60 cm TAPSE (M-mode): 3.2 cm LEFT ATRIUM             Index       RIGHT ATRIUM           Index LA diam:        3.90 cm 1.91 cm/m  RA Area:     13.60 cm LA Vol (A2C):   58.6 ml 28.76 ml/m RA Volume:   28.90 ml  14.18 ml/m LA Vol (A4C):   46.5 ml 22.82 ml/m LA Biplane Vol: 56.8 ml 27.88 ml/m  AORTIC VALVE LVOT Vmax:   119.00 cm/s LVOT Vmean:  73.100 cm/s LVOT VTI:    0.299 m  AORTA Ao Root diam: 2.60 cm Ao Asc diam:  3.10 cm MITRAL VALVE MV Area (PHT): 3.99 cm     SHUNTS MV Decel Time: 190 msec     Systemic VTI:  0.30 m MV E velocity: 112.00 cm/s  Systemic Diam: 2.10 cm MV A velocity: 57.50 cm/s MV E/A ratio:  1.95 Glori Bickers MD Electronically signed by Glori Bickers MD Signature Date/Time: 10/03/2020/4:35:51 PM    Final  Imaging independently reviewed in Epic.    [facility] for Infectious [facility] [phone] pager 10/04/2020, 10:02 AM  I spent greater than 35 minutes with the patient including greater than 50% of time in face to face counsel of the patient and in coordination of their care.

## 2020-10-04 NOTE — Progress Notes (Signed)
   Providing Compassionate, Quality Care - Together  NEUROSURGERY PROGRESS NOTE   S: No issues overnight. Language barrier, sitting up and eating breakfast  O: EXAM:  BP 124/88   Pulse (!) 56   Temp 97.8 F (36.6 C) (Oral)   Resp 17   Ht 5\' 8"  (1.727 m)   Wt 89.8 kg   SpO2 100%   BMI 30.10 kg/m   Awake, alert Language barrier Face symmetric Moves all extremities equally Incision clean dry and intact  ASSESSMENT:  40 y.o. female with   1.  Multiple brain lesions  -Status post left temporal open biopsy  PLAN: -Awaiting results -Continue supportive care    Thank you for allowing me to participate in this patient's care.  Please do not hesitate to call with questions or concerns.   Elwin Sleight, [facility] Cell: [phone]

## 2020-10-04 NOTE — Evaluation (Signed)
Physical Therapy Evaluation and Discharge Patient Details Name: Candace Jones MRN: [id number] DOB: 02-27-1981 Today's Date: 10/04/2020   History of Present Illness  Pt admitted with aphasia and right-sided weakness.MRI which demonstrated 2 brain lesions (left posterior frontal and left posterior temporal enhancing lesion with surrounding edema and mass-effect).The patient's metastatic work-up demonstrated uterine and rectum lesions of unknown significance, HIV/AIDS +, s/p left posterior temporal craniectomy for open brain biopsy and cultures 4/21  Clinical Impression  Patient evaluated by Physical Therapy with no further acute PT needs identified. Pt ambulating x 250 feet with no assistive device and negotiated 12 steps without physical difficulty. Pt reports vision has improved since admission. Difficult to assess cognition due to language barrier, however, pt does demonstrate decreased orientation to time. Pt has excellent family support and supervision between older daughter and sisters. All education has been completed and the patient has no further questions. No follow-up Physical Therapy or equipment needs. PT is signing off. Thank you for this referral.     Follow Up Recommendations No PT follow up;Supervision - Intermittent    Equipment Recommendations  None recommended by PT    Recommendations for Other Services       Precautions / Restrictions Precautions Precautions: None Restrictions Weight Bearing Restrictions: No      Mobility  Bed Mobility Overal bed mobility: Independent                  Transfers Overall transfer level: Independent Equipment used: None                Ambulation/Gait Ambulation/Gait assistance: Supervision Gait Distance (Feet): 250 Feet Assistive device: None Gait Pattern/deviations: Step-through pattern Gait velocity: decreased   General Gait Details: Increased lumbar extension, slower pace, no overt LOB  Stairs Stairs:  Yes Stairs assistance: Supervision Stair Management: No rails Number of Stairs: 12 General stair comments: Step over step pattern  Wheelchair Mobility    Modified Rankin (Stroke Patients Only)       Balance Overall balance assessment: Mild deficits observed, not formally tested                                           Pertinent Vitals/Pain Pain Assessment: No/denies pain    Home Living Family/patient expects to be discharged to:: Private residence Living Arrangements: Children (73 yo dtr, 89 yo dtr) Available Help at Discharge: Family Type of Home: Apartment Home Access: Stairs to enter   Technical brewer of Steps: 1 Home Layout: Two level   Additional Comments: 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    Prior Function Level of Independence: Independent               Hand Dominance   Dominant Hand: Right    Extremity/Trunk Assessment   Upper Extremity Assessment Upper Extremity Assessment: Defer to OT evaluation    Lower Extremity Assessment Lower Extremity Assessment: Overall WFL for tasks assessed    Cervical / Trunk Assessment Cervical / Trunk Assessment: Normal  Communication   Communication: Prefers language other than English (Candace Jones)  Cognition Arousal/Alertness: Awake/alert Behavior During Therapy: WFL for tasks assessed/performed Overall Cognitive Status: Difficult to assess                                 General Comments: Having trouble remembering dates  General Comments      Exercises     Assessment/Plan    PT Assessment Patent does not need any further PT services  PT Problem List         PT Treatment Interventions      PT Goals (Current goals can be found in the Care Plan section)  Acute Rehab PT Goals Patient Stated Goal: did not state PT Goal Formulation: All assessment and education complete, DC therapy    Frequency     Barriers to discharge        Co-evaluation                AM-PAC PT "6 Clicks" Mobility  Outcome Measure Help needed turning from your back to your side while in a flat bed without using bedrails?: None Help needed moving from lying on your back to sitting on the side of a flat bed without using bedrails?: None Help needed moving to and from a bed to a chair (including a wheelchair)?: None Help needed standing up from a chair using your arms (e.g., wheelchair or bedside chair)?: None Help needed to walk in hospital room?: A Little Help needed climbing 3-5 steps with a railing? : A Little 6 Click Score: 22    End of Session Equipment Utilized During Treatment: Gait belt Activity Tolerance: Patient tolerated treatment well Patient left: in chair;with call bell/phone within reach;Other (comment) (with OT) Nurse Communication: Mobility status PT Visit Diagnosis: Difficulty in walking, not elsewhere classified (R26.2)    Time: 0928-1000 PT Time Calculation (min) (ACUTE ONLY): 32 min   Charges:   PT Evaluation $PT Eval Moderate Complexity: 1 Mod PT Treatments $Therapeutic Activity: 8-22 mins        Wyona Almas, PT, DPT Acute Rehabilitation Services Pager [phone] Office [phone]   Deno Etienne 10/04/2020, 10:24 AM

## 2020-10-05 LAB — BASIC METABOLIC PANEL
Anion gap: 6 (ref 5–15)
BUN: 11 mg/dL (ref 6–20)
CO2: 24 mmol/L (ref 22–32)
Calcium: 8.4 mg/dL — ABNORMAL LOW (ref 8.9–10.3)
Chloride: 105 mmol/L (ref 98–111)
Creatinine, Ser: 0.79 mg/dL (ref 0.44–1.00)
GFR, Estimated: >60 mL/min (ref >60)
Glucose, Bld: 83 mg/dL (ref 70–99)
Potassium: 3.1 mmol/L — ABNORMAL LOW (ref 3.5–5.1)
Sodium: 135 mmol/L (ref 135–145)

## 2020-10-05 LAB — CBC WITH DIFFERENTIAL/PLATELET
Abs Immature Granulocytes: 0.05 10*3/uL (ref 0.00–0.07)
Basophils Absolute: 0 10*3/uL (ref 0.0–0.1)
Basophils Relative: 0 %
Eosinophils Absolute: 0.1 10*3/uL (ref 0.0–0.5)
Eosinophils Relative: 2 %
HCT: 26.9 % — ABNORMAL LOW (ref 36.0–46.0)
Hemoglobin: 7.7 g/dL — ABNORMAL LOW (ref 12.0–15.0)
Immature Granulocytes: 1 %
Lymphocytes Relative: 35 %
Lymphs Abs: 1.6 10*3/uL (ref 0.7–4.0)
MCH: 19.1 pg — ABNORMAL LOW (ref 26.0–34.0)
MCHC: 28.6 g/dL — ABNORMAL LOW (ref 30.0–36.0)
MCV: 66.7 fL — ABNORMAL LOW (ref 80.0–100.0)
Monocytes Absolute: 0.6 10*3/uL (ref 0.1–1.0)
Monocytes Relative: 12 %
Neutro Abs: 2.3 10*3/uL (ref 1.7–7.7)
Neutrophils Relative %: 50 %
Platelets: 172 10*3/uL (ref 150–400)
RBC: 4.03 MIL/uL (ref 3.87–5.11)
RDW: 29.2 % — ABNORMAL HIGH (ref 11.5–15.5)
WBC: 4.6 10*3/uL (ref 4.0–10.5)
nRBC: 0 % (ref 0.0–0.2)

## 2020-10-05 MED ORDER — POTASSIUM CHLORIDE CRYS ER 20 MEQ PO TBCR
40.0000 meq | EXTENDED_RELEASE_TABLET | ORAL | Status: AC
  Administered 2020-10-05 (×2): 40 meq via ORAL
  Filled 2020-10-05 (×2): qty 2

## 2020-10-05 MED ORDER — LEVETIRACETAM 750 MG PO TABS
750.0000 mg | ORAL_TABLET | Freq: Two times a day (BID) | ORAL | Status: DC
  Administered 2020-10-05 – 2020-10-10 (×11): 750 mg via ORAL
  Filled 2020-10-05 (×11): qty 1

## 2020-10-05 NOTE — Progress Notes (Signed)
   Providing Compassionate, Quality Care - Together  NEUROSURGERY PROGRESS NOTE   S: No issues overnight.  Sitting up eating breakfast  O: EXAM:  BP (!) 115/96 (BP Location: Right Arm)   Pulse (!) 58   Temp 97.8 F (36.6 C) (Oral)   Resp 16   Ht 5\' 8"  (1.727 m)   Wt 89.8 kg   SpO2 100%   BMI 30.10 kg/m   Awake, alert Language barrier Face symmetric Moves all extremities equally Incision clean dry and intact  ASSESSMENT:  40 y.o. female with   1.  Multiple brain lesions  -Status post left temporal open biopsy  PLAN: -Awaiting results -Continue supportive care     Thank you for allowing me to participate in this patient's care.  Please do not hesitate to call with questions or concerns.   Elwin Sleight, [facility] Cell: [phone]

## 2020-10-05 NOTE — Progress Notes (Signed)
PROGRESS NOTE    Candace Jones  INO:[id number] DOB: 1980-06-26 DOA: 09/29/2020 PCP: Patient, No Pcp Per (Inactive)   Chief Complain: Aphasia, right-sided weakness  Brief Narrative: Patient is a 40 year old female from [place] with no significant past medical history was brought to the emergency department with complaints of aphasia, right facial droop, right-sided weakness.  She was also found to be confused and code stroke was called in the emergency department.  She was also complaining of mild headache since last few days, also complained of severe menorrhagia.  She also had a generalized tonic-clonic seizure in the emergency department.  CT head done in the emergency department showed possible left-sided brain mass with edema. 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   metastatic process.  Neurosurgery, neurology consulted. She was loaded with Keppra on presentation.  Lab work also showed hemoglobin of 6, transfused with PRBC. She underwent open brain biopsy by neurosurgery on 10/02/2020.  Pending biopsy report  Assessment & Plan:   Principal Problem:   Intracranial mass Active Problems:   Seizure (HCC)   Microcytic hypochromic anemia   Brain mass   Ankle pain   AIDS (acquired immune deficiency syndrome) (HCC)   Uterine mass   Perirectal abscess   Brain tumor ([place])   MRI contraindicated due to metal implant   Intracranial mass:  CT head done in the emergency department showed possible left-sided brain mass with edema. 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    Neurosurgery, neurology consulted.  She was loaded with Keppra on presentation.  Also started on Decadron. Lumbar puncture not possible due to possible increased intracranial pressure/midline shift. She underwent open brain biopsy by neurosurgery on 10/02/2020.   Pending biopsy report: Follow-up toxoplasma, fungus, cryptococcus, AFB, culture, pathology. Cystic lesions suspicious for toxoplasma, but cannot rule out malignancy.  Toxoplasma IgG positive.  Started on IV Bactrim.  HIV 1:  Low  CD4 of less than 35.  Toxoplasma IgG  Positive,IgM negative. Negative  QuantiFERON.reactive RPR,negative  cryptococcus.  ID following. She was complaining of blurry vision earlier but now is saying that her vision is clear.  She also has a diffuse maculopapular skin lesions, but these lesions are healing, do not look active.  Bradycardia: Patient persistently bradycardic with heart rate in the range of 40-60.  She is asymptomatic. echocardiogram showed ejection fraction of 55 to 60%, no other abnormalities.  Monitor on telemetry.  Denies any lightheadedness or dizziness  Seizure: Had an episode of tonic-clonic seizure in the emergency department most likely acid with the brain mass/cystic lesion.  Neurology was following.  EEG showed cortical dysfunction in the left temporal region, moderate diffuse encephalopathy, no seizures or epileptiform discharges.  Continue Keppra 750 mg twice a day.  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   MRI abd/pelvis showed bulky enlargement of the uterus with multiple uterine masses, the largest of which measures 10 x 10 x 9.6 cm with suspicion for leiomyosarcoma.Also showed  paramidline pelvic floor/Peri coccygeal and perirectal cystic area measuring 5.3 x 3.3 cm with mildly thickened wall,possible  tail gut cyst.  She denies any rectal pain or rectal bleeding or discomfort. We discussed with oncology about this finding.  We will follow-up pathology from the brain biopsy.  Acute metabolic encephalopathy: Likely precipitated by seizure.  Currently  alert and oriented    microcytic hypochromic anemia: No previous labs to compare.  Was having heavy menstrual cycles.  She was transfused with  PRBCs during this hospitalization.  Monitor CBC.  Hemoglobin currently stable at 8.  Iron studies showed low iron, given a dose of iron infusion.  Continue oral supplementation.  Hypokalemia: Supplemented with potassium.  Debility/deconditioning: PT/OT evaluation requested.  PT /OT did not recommend any follow-up.  Nutrition Problem: Increased nutrient needs Etiology: acute illness      DVT prophylaxis:SCD Code Status: Full Family Communication: Sister at the bedside Status is: Inpatient  Remains inpatient appropriate because:Inpatient level of care appropriate due to severity of illness   Dispo: The patient is from: Home              Anticipated d/c is to: Home              Patient currently is not medically stable to d/c.   Difficult to place patient No     Consultants: ID, neurosurgery  Procedures:  Antimicrobials:  Anti-infectives (From admission, onward)   Start     Dose/Rate Route Frequency Ordered Stop   10/03/20 1815  sulfamethoxazole-trimethoprim (BACTRIM) 448.96 mg in dextrose 5 % 500 mL IVPB        10 mg/kg/day  89.8 kg 352 mL/hr over 90 Minutes Intravenous Every 12 hours 10/03/20 1725     10/02/20 1630  ceFAZolin (ANCEF) IVPB 2g/100 mL premix        2 g 200 mL/hr over 30 Minutes Intravenous Every 8 hours 10/02/20 1533 10/03/20 0051   10/02/20 1038  ceFAZolin (ANCEF) 2-4 GM/100ML-% IVPB       Note to Pharmacy: Laurita Quint   : cabinet override      10/02/20 1038 10/02/20 2244      Subjective:  Patient seen and examined the bedside this morning.  Hemodynamically stable.  Comfortable.  Denies any complaints today.  Objective: Vitals:   10/04/20 1414 10/04/20 2000 10/05/20 0012 10/05/20 0330  BP: (!) 141/75 133/89  (!) 115/96  Pulse: (!) 50 (!) 58    Resp: 16     Temp: 98.2 F (36.8 C) 98.4 F (36.9 C) 98.4 F (36.9 C) 97.8  F (36.6 C)  TempSrc: Oral Oral Oral Oral  SpO2: 100%     Weight:      Height:       No intake or output data in the 24 hours ending 10/05/20 0800 Filed Weights   09/29/20 1925  Weight: 89.8 kg    Examination:  General exam: Overall comfortable, not in distress HEENT: PERRL,sutures in scalp Respiratory system:  no wheezes or crackles  Cardiovascular system: S1 & S2 heard, RRR.  Gastrointestinal system: Abdomen is nondistended, soft and nontender.  Mass can be felt on the lower abdomen Central nervous system: Alert and oriented Extremities: No edema, no clubbing ,no cyanosis Skin: Healing/dry maculopapular rash on the face, trunk and upper extremities   Data Reviewed: I have personally reviewed following labs and imaging studies  CBC: Recent Labs  Lab 09/30/20 0555 10/01/20 0744 10/02/20 0456 10/02/20 1117 10/03/20 0420 10/05/20 0359  WBC 3.1* 4.9 4.4  --  7.2 4.6  NEUTROABS 2.0 3.1 3.4  --  5.8 2.3  HGB 7.1* 7.3* 7.1* 9.2* 8.1* 7.7*  HCT 25.5* 25.7* 25.5* 27.0* 28.0* 26.9*  MCV 63.1* 62.5* 64.6*  --  65.9* 66.7*  PLT 183 219 196  --  187 [id number]   Basic Metabolic Panel: Recent Labs  Lab 09/30/20 0555 10/01/20 0744 10/01/20 2222 10/02/20 0456 10/02/20 1117 10/03/20 0420 10/05/20 0359  NA 136 137  --  136 140 138 135  K 3.8 3.3*  --  4.4 3.9 4.0 3.1*  CL 109 108  --  109  --  109 105  CO2 20* 24  --  21*  --  21* 24  GLUCOSE 104* 99  --  115*  --  111* 83  BUN 6 11  --  12  --  15 11  CREATININE 0.48 0.58  --  0.49  --  0.59 0.79  CALCIUM 8.5* 8.8*  --  8.8*  --  8.4* 8.4*  MG  --   --  2.2  --   --   --   --   PHOS  --   --  3.6  --   --   --   --    GFR: Estimated Creatinine Clearance: 109.6 mL/min (by C-G formula based on SCr of 0.79 mg/dL). Liver Function Tests: Recent Labs  Lab 09/29/20 1814 09/30/20 0555  AST 41 33  ALT 27 24  ALKPHOS 66 60  BILITOT 0.6 0.7  PROT 9.7* 9.0*  ALBUMIN 3.6 3.2*   No results for input(s): LIPASE, AMYLASE in the  last 168 hours. No results for input(s): AMMONIA in the last 168 hours. Coagulation Profile: Recent Labs  Lab 09/29/20 1814  INR 1.1   Cardiac Enzymes: No results for input(s): CKTOTAL, CKMB, CKMBINDEX, TROPONINI in the last 168 hours. BNP (last 3 results) No results for input(s): PROBNP in the last 8760 hours. HbA1C: No results for input(s): HGBA1C in the last 72 hours. CBG: Recent Labs  Lab 10/02/20 1559 10/02/20 2333 10/03/20 1228 10/03/20 1744 10/03/20 2324  GLUCAP 115* 127* 106* 113* 129*   Lipid Profile: No results for input(s): CHOL, HDL, LDLCALC, TRIG, CHOLHDL, LDLDIRECT in the last 72 hours. Thyroid Function Tests: No results for input(s): TSH, T4TOTAL, FREET4, T3FREE, THYROIDAB in the last 72 hours. Anemia Panel: No results for input(s): VITAMINB12, FOLATE, FERRITIN, TIBC, IRON, RETICCTPCT in the last 72 hours. Sepsis Labs: No results for input(s): PROCALCITON, LATICACIDVEN in the last 168 hours.  Recent Results (from the past 240 hour(s))  Resp Panel by RT-PCR (Flu A&B, Covid) Nasopharyngeal Swab     Status: None   Collection Time: 09/29/20 10:45 PM   Specimen: Nasopharyngeal Swab; Nasopharyngeal(NP) swabs in vial transport medium  Result Value Ref Range Status   SARS Coronavirus 2 by RT PCR NEGATIVE NEGATIVE Final    Comment: (NOTE) SARS-CoV-2 target nucleic acids are NOT DETECTED.  The SARS-CoV-2 RNA is generally detectable in upper respiratory specimens during the acute phase of infection. The lowest concentration of SARS-CoV-2 viral copies this assay can detect is 138 copies/mL. A negative result does not preclude SARS-Cov-2 infection and should not be used as the sole basis for treatment or other patient management decisions. A negative result may occur with  improper specimen collection/handling, submission of specimen other than nasopharyngeal swab, presence of viral mutation(s) within the areas targeted by this assay, and inadequate number of  viral copies(<138 copies/mL). A negative result must be combined with clinical observations, patient history, and epidemiological information. The expected result is Negative.  Fact Sheet for Patients:  EntrepreneurPulse.com.au  Fact Sheet for Healthcare Providers:  IncredibleEmployment.be  This test is no t yet approved or cleared by the [place] and  has been authorized  for detection and/or diagnosis of SARS-CoV-2 by FDA under an Emergency Use Authorization (EUA). This EUA will remain  in effect (meaning this test can be used) for the duration of the COVID-19 declaration under Section 564(b)(1) of the Act, 21 U.S.C.section 360bbb-3(b)(1), unless the authorization is terminated  or revoked sooner.       Influenza A by PCR NEGATIVE NEGATIVE Final   Influenza B by PCR NEGATIVE NEGATIVE Final    Comment: (NOTE) The Xpert Xpress SARS-CoV-2/FLU/RSV plus assay is intended as an aid in the diagnosis of influenza from Nasopharyngeal swab specimens and should not be used as a sole basis for treatment. Nasal washings and aspirates are unacceptable for Xpert Xpress SARS-CoV-2/FLU/RSV testing.  Fact Sheet for Patients: EntrepreneurPulse.com.au  Fact Sheet for Healthcare Providers: IncredibleEmployment.be  This test is not yet approved or cleared by the [place] FDA and has been authorized for detection and/or diagnosis of SARS-CoV-2 by FDA under an Emergency Use Authorization (EUA). This EUA will remain in effect (meaning this test can be used) for the duration of the COVID-19 declaration under Section 564(b)(1) of the Act, 21 U.S.C. section 360bbb-3(b)(1), unless the authorization is terminated or revoked.  Performed at [facility], [place] [address], [place], [place] [address]   MRSA PCR Screening     Status: None   Collection Time: 09/30/20  1:07 AM   Specimen: Nasal Mucosa; Nasopharyngeal   Result Value Ref Range Status   MRSA by PCR NEGATIVE NEGATIVE Final    Comment:        The GeneXpert MRSA Assay (FDA approved for NASAL specimens only), is one component of a comprehensive MRSA colonization surveillance program. It is not intended to diagnose MRSA infection nor to guide or monitor treatment for MRSA infections. Performed at [facility], [place] [address], [place], [place] [address]   Culture, fungus without smear     Status: None (Preliminary result)   Collection Time: 10/02/20 12:36 PM   Specimen: PATH Other; Tissue  Result Value Ref Range Status   Specimen Description BIOPSY  Final   Special Requests LEFT POSTERIOR BRAIN  Final   Culture   Final    NO FUNGUS ISOLATED AFTER 2 DAYS Performed at [facility] [address] [place], [place] [address]    Report Status PENDING  Incomplete         Radiology Studies: ECHOCARDIOGRAM COMPLETE  Result Date: 10/03/2020    ECHOCARDIOGRAM REPORT   Patient Name:   GEORGIA DELSIGNORE Date of Exam: 10/03/2020 Medical Rec #:  [id number]        Height:       68.1 in Accession #:    [id number]       Weight:       198.0 lb Date of Birth:  [birth year]       BSA:          2.037 m Patient Age:    57 years         BP:           139/109 mmHg Patient Gender: F                HR:           44 bpm. Exam Location:  Inpatient Procedure: 2D Echo Indications:    abnormal ecg  History:        Patient has no prior history of Echocardiogram examinations.  Brain tumor.  Sonographer:    Johny Chess Referring Phys: [id number] Daejah Klebba IMPRESSIONS  1. Left ventricular ejection fraction, by estimation, is 55 to 60%. The left ventricle has normal function. The left ventricle has no regional wall motion abnormalities. Left ventricular diastolic parameters were normal.  2. Right ventricular systolic function is normal. The right ventricular size is normal.  3. The mitral valve is normal in structure. Trivial mitral valve  regurgitation. No evidence of mitral stenosis.  4. The aortic valve is normal in structure. Aortic valve regurgitation is not visualized. No aortic stenosis is present.  5. The inferior vena cava is normal in size with greater than 50% respiratory variability, suggesting right atrial pressure of 3 mmHg. FINDINGS  Left Ventricle: Left ventricular ejection fraction, by estimation, is 55 to 60%. The left ventricle has normal function. The left ventricle has no regional wall motion abnormalities. The left ventricular internal cavity size was normal in size. There is  no left ventricular hypertrophy. Left ventricular diastolic parameters were normal. Right Ventricle: The right ventricular size is normal. No increase in right ventricular wall thickness. Right ventricular systolic function is normal. Left Atrium: Left atrial size was normal in size. Right Atrium: Right atrial size was normal in size. Pericardium: There is no evidence of pericardial effusion. Mitral Valve: The mitral valve is normal in structure. Trivial mitral valve regurgitation. No evidence of mitral valve stenosis. Tricuspid Valve: The tricuspid valve is normal in structure. Tricuspid valve regurgitation is not demonstrated. No evidence of tricuspid stenosis. Aortic Valve: The aortic valve is normal in structure. Aortic valve regurgitation is not visualized. No aortic stenosis is present. Pulmonic Valve: The pulmonic valve was normal in structure. Pulmonic valve regurgitation is trivial. No evidence of pulmonic stenosis. Aorta: The aortic root is normal in size and structure. Venous: The inferior vena cava is normal in size with greater than 50% respiratory variability, suggesting right atrial pressure of 3 mmHg. IAS/Shunts: No atrial level shunt detected by color flow Doppler.  LEFT VENTRICLE PLAX 2D LVIDd:         4.90 cm  Diastology LVIDs:         3.10 cm  LV e' medial:    10.00 cm/s LV PW:         0.90 cm  LV E/e' medial:  11.2 LV IVS:        0.80  cm  LV e' lateral:   18.50 cm/s LVOT diam:     2.10 cm  LV E/e' lateral: 6.1 LV SV:         104 LV SV Index:   51 LVOT Area:     3.46 cm  RIGHT VENTRICLE             IVC RV S prime:     14.70 cm/s  IVC diam: 1.60 cm TAPSE (M-mode): 3.2 cm LEFT ATRIUM             Index       RIGHT ATRIUM           Index LA diam:        3.90 cm 1.91 cm/m  RA Area:     13.60 cm LA Vol (A2C):   58.6 ml 28.76 ml/m RA Volume:   28.90 ml  14.18 ml/m LA Vol (A4C):   46.5 ml 22.82 ml/m LA Biplane Vol: 56.8 ml 27.88 ml/m  AORTIC VALVE LVOT Vmax:   119.00 cm/s LVOT Vmean:  73.100 cm/s LVOT VTI:  0.299 m  AORTA Ao Root diam: 2.60 cm Ao Asc diam:  3.10 cm MITRAL VALVE MV Area (PHT): 3.99 cm     SHUNTS MV Decel Time: 190 msec     Systemic VTI:  0.30 m MV E velocity: 112.00 cm/s  Systemic Diam: 2.10 cm MV A velocity: 57.50 cm/s MV E/A ratio:  1.95 Glori Bickers MD Electronically signed by Glori Bickers MD Signature Date/Time: 10/03/2020/4:35:51 PM    Final         Scheduled Meds: . sodium chloride   Intravenous Once  . Chlorhexidine Gluconate Cloth  6 each Topical Daily  . dexamethasone (DECADRON) injection  4 mg Intravenous Q8H  . docusate sodium  100 mg Oral BID  . feeding supplement  237 mL Oral BID BM  . ferrous sulfate  325 mg Oral Q breakfast  . nystatin  5 mL Oral QID  . pantoprazole (PROTONIX) IV  40 mg Intravenous QHS  . sodium chloride flush  3 mL Intravenous Once   Continuous Infusions: . levETIRAcetam 750 mg (10/04/20 2046)  . sulfamethoxazole-trimethoprim 448.96 mg (10/04/20 2114)     LOS: 5 days    Time spent: 35 mins,More than 50% of that time was spent in counseling and/or coordination of care.      Shelly Coss, MD [facility] P4/24/2022, 8:00 AM

## 2020-10-06 DIAGNOSIS — T8089XA Other complications following infusion, transfusion and therapeutic injection, initial encounter: Secondary | ICD-10-CM

## 2020-10-06 DIAGNOSIS — K644 Residual hemorrhoidal skin tags: Secondary | ICD-10-CM

## 2020-10-06 LAB — CBC WITH DIFFERENTIAL/PLATELET
Abs Immature Granulocytes: 0.05 10*3/uL (ref 0.00–0.07)
Basophils Absolute: 0 10*3/uL (ref 0.0–0.1)
Basophils Relative: 0 %
Eosinophils Absolute: 0 10*3/uL (ref 0.0–0.5)
Eosinophils Relative: 0 %
HCT: 30.5 % — ABNORMAL LOW (ref 36.0–46.0)
Hemoglobin: 8.8 g/dL — ABNORMAL LOW (ref 12.0–15.0)
Immature Granulocytes: 1 %
Lymphocytes Relative: 19 %
Lymphs Abs: 0.8 10*3/uL (ref 0.7–4.0)
MCH: 19.7 pg — ABNORMAL LOW (ref 26.0–34.0)
MCHC: 28.9 g/dL — ABNORMAL LOW (ref 30.0–36.0)
MCV: 68.4 fL — ABNORMAL LOW (ref 80.0–100.0)
Monocytes Absolute: 0.3 10*3/uL (ref 0.1–1.0)
Monocytes Relative: 6 %
Neutro Abs: 3.2 10*3/uL (ref 1.7–7.7)
Neutrophils Relative %: 74 %
Platelets: 193 10*3/uL (ref 150–400)
RBC: 4.46 MIL/uL (ref 3.87–5.11)
RDW: 30.4 % — ABNORMAL HIGH (ref 11.5–15.5)
WBC: 4.4 10*3/uL (ref 4.0–10.5)
nRBC: 0 % (ref 0.0–0.2)

## 2020-10-06 LAB — ACID FAST SMEAR (AFB, MYCOBACTERIA): Acid Fast Smear: NEGATIVE

## 2020-10-06 LAB — BASIC METABOLIC PANEL
Anion gap: 5 (ref 5–15)
BUN: 8 mg/dL (ref 6–20)
CO2: 24 mmol/L (ref 22–32)
Calcium: 9 mg/dL (ref 8.9–10.3)
Chloride: 105 mmol/L (ref 98–111)
Creatinine, Ser: 0.73 mg/dL (ref 0.44–1.00)
GFR, Estimated: >60 mL/min (ref >60)
Glucose, Bld: 105 mg/dL — ABNORMAL HIGH (ref 70–99)
Potassium: 3.7 mmol/L (ref 3.5–5.1)
Sodium: 134 mmol/L — ABNORMAL LOW (ref 135–145)

## 2020-10-06 LAB — PATHOLOGIST SMEAR REVIEW

## 2020-10-06 LAB — SURGICAL PATHOLOGY

## 2020-10-06 MED ORDER — PHENYLEPHRINE HCL-NACL 10-0.9 MG/250ML-% IV SOLN
INTRAVENOUS | Status: AC
  Filled 2020-10-06: qty 500

## 2020-10-06 MED ORDER — PANTOPRAZOLE SODIUM 40 MG PO TBEC
40.0000 mg | DELAYED_RELEASE_TABLET | Freq: Every day | ORAL | Status: DC
  Administered 2020-10-06 – 2020-10-09 (×4): 40 mg via ORAL
  Filled 2020-10-06 (×4): qty 1

## 2020-10-06 MED FILL — Thrombin For Soln 5000 Unit: CUTANEOUS | Qty: 5000 | Status: AC

## 2020-10-06 NOTE — Progress Notes (Signed)
[facility]  Date of Admission:  09/29/2020      Total days of antibiotics 4 bactrim           ASSESSMENT: Candace Jones is a 40 y.o. 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 She was extremely thankful that this is not cancer and can be cured. We spent time talking about how her antiretroviral therapy in the next few weeks will be started and imperative for her to continue so her immune system gets stronger. For now will defer for probably 2 weeks after toxo is treated based on literatures suggestion, then start her on Biktarvy once daily. Waiting for pyrimethamine + sulfadiazine + leucovorin for optimal therapy to continue total 6 weeks.  All CNS symptoms have seemed to resolved, no need for corticosteroids at this point.   Regarding the perirectal cyst - would probably need to get IR to aspirate this for cultures. We did not discuss this today but will tomorrow  IV access infiltration - I stopped the infusion and alerted her nurse.    PLAN: 1. Continue IV bactrim for now 2. Hopeful to get optimal regimen by Wednesday/Thrusday pyrimethamine (200 mg x 1 then 75 mg QD)+ sulfadiazine + leucovorin  3. Defer ART until she completes 2 weeks of treatment for toxo  4. 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 sodium chloride   Intravenous Once  . Chlorhexidine Gluconate Cloth  6 each Topical Daily  . dexamethasone (DECADRON) injection  4 mg Intravenous Q8H  . docusate sodium  100 mg Oral  BID  . feeding supplement  237 mL Oral BID BM  . ferrous sulfate  325 mg Oral Q breakfast  . levETIRAcetam  750 mg Oral BID  . nystatin  5 mL Oral QID  . pantoprazole  40 mg Oral QHS    SUBJECTIVE: Sisters present - permission to discuss with them at the bedside. [place] interpretor via [employer] tablet to facilitate.   She is in good spirits today. She has no headaches, vision changes, weakness and has a better appetite today.   She denies any pain with bowel movements or changes to bowel movements. Only has an external hemorrhoid that is not causing any problems. Typical menstrual cycles from what she reports, nothing out of the ordinary and not overtly painful. Finished her menses during this hospitalization.   Rash all over body including face present for 6 months or so. Does itch occasionally.    Review of Systems: Review of Systems  Constitutional: Negative for chills, fever and malaise/fatigue.  HENT: Negative for ear pain, hearing loss, sore throat and tinnitus.   Eyes: Negative for blurred vision, double vision, photophobia and pain.  Respiratory: Negative for cough, sputum production and shortness of breath.   Cardiovascular: Positive for chest pain.  Gastrointestinal: Negative for abdominal pain, blood in stool, diarrhea, nausea and vomiting.  Genitourinary: Negative for dysuria.  Musculoskeletal: Negative for myalgias and neck pain.  Skin: Positive for itching and rash.  Neurological: Negative for dizziness, speech change, focal weakness, weakness and headaches.  Psychiatric/Behavioral: Negative for depression. The patient is not nervous/anxious.     No Known Allergies  OBJECTIVE: Vitals:   10/05/20 1946 10/05/20 2357 10/06/20 0401 10/06/20 0823  BP:  131/86 111/65 108/79  Pulse:  61 (!) 49 (!) 50  Resp:  20 14 15   Temp: 98.3 F (36.8 C) 97.7 F (36.5 C) 97.7 F (36.5 C) 98 F (36.7 C)  TempSrc: Oral Oral Oral Oral  SpO2:  100% 100% 100%  Weight:      Height:        Body mass index is 30.1 kg/m.  Physical Exam Constitutional:      Appearance: She is well-developed.     Comments: Well appearing today sitting up in bed. Pleasant, smiling  HENT:     Head:     Comments: Incision to left temporal lobe with clean/intact honeycomb dressing    Mouth/Throat:     Mouth: No oral lesions.     Dentition: Normal dentition. No dental abscesses.     Pharynx: No oropharyngeal exudate.  Eyes:     General: No scleral icterus.    Pupils: Pupils are equal, round, and reactive to light.  Cardiovascular:     Rate and Rhythm: Normal rate and regular rhythm.     Heart sounds: Normal heart sounds.  Pulmonary:     Effort: Pulmonary effort is normal.     Breath sounds: Normal breath sounds.  Abdominal:     General: There is no distension.     Palpations: Abdomen is soft.     Tenderness: There is no abdominal tenderness.  Genitourinary:    General: Normal vulva.     Rectum: Normal.     Comments: Hemorrhoid noted at 6 o'clock position. No surrounding induration or pain around anus. External labia normal appearing.  Musculoskeletal:        General: Normal range of motion.     Cervical back: Normal range of motion.  Lymphadenopathy:     Cervical: No cervical adenopathy.  Skin:    General: Skin is warm and dry.     Capillary Refill: Capillary refill takes less than 2 seconds.     Findings: Rash present.     Comments: Scattered hyperpigmented macules / modules, some appear umbilicated on the extremities, some flat.  Noted and more concentrated on bilateral cheeks.   Neurological:     General: No focal deficit present.     Mental Status: She is alert and oriented to person, place, and time.     Cranial Nerves: No cranial nerve deficit.     Sensory: No sensory deficit.  Psychiatric:        Judgment: Judgment normal.     Comments: In good spirits today and engaged in care discussion     Lab Results Lab Results  Component Value Date   WBC 4.4  10/06/2020   HGB 8.8 (L) 10/06/2020   HCT 30.5 (L) 10/06/2020   MCV 68.4 (L) 10/06/2020   PLT 193 10/06/2020    Lab Results  Component Value Date   CREATININE 0.73 10/06/2020   BUN 8 10/06/2020   NA 134 (L) 10/06/2020   K 3.7 10/06/2020   CL 105 10/06/2020   CO2 24 10/06/2020    Lab Results  Component Value Date   ALT 24 09/30/2020   AST 33 09/30/2020   ALKPHOS 60 09/30/2020   BILITOT 0.7 09/30/2020  Microbiology: Recent Results (from the past 240 hour(s))  Resp Panel by RT-PCR (Flu A&B, Covid) Nasopharyngeal Swab     Status: None   Collection Time: 09/29/20 10:45 PM   Specimen: Nasopharyngeal Swab; Nasopharyngeal(NP) swabs in vial transport medium  Result Value Ref Range Status   SARS Coronavirus 2 by RT PCR NEGATIVE NEGATIVE Final    Comment: (NOTE) SARS-CoV-2 target nucleic acids are NOT DETECTED.  The SARS-CoV-2 RNA is generally detectable in upper respiratory specimens during the acute phase of infection. The lowest concentration of SARS-CoV-2 viral copies this assay can detect is 138 copies/mL. A negative result does not preclude SARS-Cov-2 infection and should not be used as the sole basis for treatment or other patient management decisions. A negative result may occur with  improper specimen collection/handling, submission of specimen other than nasopharyngeal swab, presence of viral mutation(s) within the areas targeted by this assay, and inadequate number of viral copies(<138 copies/mL). A negative result must be combined with clinical observations, patient history, and epidemiological information. The expected result is Negative.  Fact Sheet for Patients:  EntrepreneurPulse.com.au  Fact Sheet for Healthcare Providers:  IncredibleEmployment.be  This test is no t yet approved or cleared by the [place] FDA and  has been authorized for detection and/or diagnosis of SARS-CoV-2 by FDA under an Emergency Use  Authorization (EUA). This EUA will remain  in effect (meaning this test can be used) for the duration of the COVID-19 declaration under Section 564(b)(1) of the Act, 21 U.S.C.section 360bbb-3(b)(1), unless the authorization is terminated  or revoked sooner.       Influenza A by PCR NEGATIVE NEGATIVE Final   Influenza B by PCR NEGATIVE NEGATIVE Final    Comment: (NOTE) The Xpert Xpress SARS-CoV-2/FLU/RSV plus assay is intended as an aid in the diagnosis of influenza from Nasopharyngeal swab specimens and should not be used as a sole basis for treatment. Nasal washings and aspirates are unacceptable for Xpert Xpress SARS-CoV-2/FLU/RSV testing.  Fact Sheet for Patients: EntrepreneurPulse.com.au  Fact Sheet for Healthcare Providers: IncredibleEmployment.be  This test is not yet approved or cleared by the [place] FDA and has been authorized for detection and/or diagnosis of SARS-CoV-2 by FDA under an Emergency Use Authorization (EUA). This EUA will remain in effect (meaning this test can be used) for the duration of the COVID-19 declaration under Section 564(b)(1) of the Act, 21 U.S.C. section 360bbb-3(b)(1), unless the authorization is terminated or revoked.  Performed at [facility], [place] [address] [place], [place] [address]   MRSA PCR Screening     Status: None   Collection Time: 09/30/20  1:07 AM   Specimen: Nasal Mucosa; Nasopharyngeal  Result Value Ref Range Status   MRSA by PCR NEGATIVE NEGATIVE Final    Comment:        The GeneXpert MRSA Assay (FDA approved for NASAL specimens only), is one component of a comprehensive MRSA colonization surveillance program. It is not intended to diagnose MRSA infection nor to guide or monitor treatment for MRSA infections. Performed at [facility], [place] [address] [place],  [address]   Culture, fungus without smear     Status: None (Preliminary result)   Collection  Time: 10/02/20 12:36 PM   Specimen: PATH Other; Tissue  Result Value Ref Range Status   Specimen Description BIOPSY  Final   Special Requests LEFT POSTERIOR BRAIN  Final   Culture   Final    No Fungi Isolated in 4 Weeks Performed at [facility], [place] [address] [place], [place] [address]    Report Status PENDING  Incomplete     Janene Madeira, MSN, NP-C [facility] [email or web address] Pager: [phone] Office: [phone] [place]: [phone]

## 2020-10-06 NOTE — Evaluation (Signed)
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 I chat texted Dr. Tawanna Solo and he has order SLP for cognition. No further OT needs, we will sign off.    Follow Up Recommendations  No OT follow up    Equipment Recommendations  None recommended by OT    Recommendations for Other Services Speech consult     Precautions / Restrictions Precautions Precautions: None Restrictions Weight Bearing Restrictions: No      Mobility Bed Mobility Overal bed mobility: Independent                  Transfers Overall transfer level: Independent                    Balance Overall balance assessment: Independent                                         ADL either performed or assessed with clinical judgement   ADL Overall ADL's : Independent                                             Vision Baseline Vision/History: No visual deficits Patient Visual Report: No change from baseline              Pertinent Vitals/Pain Pain Assessment: No/denies pain     Hand Dominance  right   Extremity/Trunk Assessment             Communication  [place] is primary language   Cognition Arousal/Alertness: Awake/alert Behavior During Therapy: WFL for tasks assessed/performed Overall Cognitive Status:  Impaired/Different from baseline Area of Impairment: Orientation                               General Comments: Used video interpreter--when asked year she said April, when asked month she said October; even when she self looked at calendar in room she had trouble and increased time to get answers out. Had trouble recalling 3 words at one minute and 5 minutes (could only remember 2 of the 3 both times and same two)                                   OT Goals(Current goals can be found in the care plan section) Acute Rehab OT Goals Patient Stated Goal: to go home OT Goal Formulation: With patient Time For Goal Achievement: 10/18/20 Potential to Achieve Goals: Good                AM-PAC OT "6 Clicks" Daily Activity     Outcome Measure Help from another person eating meals?: None Help  from another person taking care of personal grooming?: None Help from another person toileting, which includes using toliet, bedpan, or urinal?: None Help from another person bathing (including washing, rinsing, drying)?: None Help from another person to put on and taking off regular upper body clothing?: None Help from another person to put on and taking off regular lower body clothing?: None 6 Click Score: 24   End of Session Equipment Utilized During Treatment:  (none)  Activity Tolerance: Patient tolerated treatment well Patient left:  (up walking in room)  OT Visit Diagnosis: Other symptoms and signs involving cognitive function                Time: 0175-1025 OT Time Calculation (min): 21 min Charges:  OT General Charges $OT Visit: 1 Visit OT Treatments $Self Care/Home Management : 8-22 mins  [place] Circle, OTR/L Acute Rehab Services Pager [phone] Office [phone]     Almon Register 10/06/2020, 4:09 PM

## 2020-10-06 NOTE — Progress Notes (Signed)
Subjective: The patient is alert and pleasant.  She has no complaints.  Objective: Vital signs in last 24 hours: Temp:  [97.6 F (36.4 C)-98.3 F (36.8 C)] 97.7 F (36.5 C) (04/25 0401) Pulse Rate:  [49-61] 49 (04/25 0401) Resp:  [14-20] 14 (04/25 0401) BP: (108-131)/(65-91) 111/65 (04/25 0401) SpO2:  [99 %-100 %] 100 % (04/25 0401) Estimated body mass index is 30.1 kg/m as calculated from the following:   Height as of this encounter: 5\' 8"  (1.727 m).   Weight as of this encounter: 89.8 kg.   Intake/Output from previous day: 04/24 0701 - 04/25 0700 In: 860 [P.O.:860] Out: -  Intake/Output this shift: No intake/output data recorded.  Physical exam the patient is alert and pleasant.  Her speech seems fairly normal with a language barrier.  Her strength is normal.  Lab Results: Recent Labs    10/05/20 0359  WBC 4.6  HGB 7.7*  HCT 26.9*  PLT 172   BMET Recent Labs    10/05/20 0359  NA 135  K 3.1*  CL 105  CO2 24  GLUCOSE 83  BUN 11  CREATININE 0.79  CALCIUM 8.4*    Studies/Results: No results found.  Assessment/Plan: Postop day #4: The patient is doing well.  We are awaiting the cultures and pathology.  LOS: 6 days     Ophelia Charter 10/06/2020, 7:47 AM

## 2020-10-06 NOTE — Progress Notes (Signed)
PROGRESS NOTE    Candace Jones  UMP:[id number] DOB: 1981/04/07 DOA: 09/29/2020 PCP: Patient, No Pcp Per (Inactive)   Chief Complain: Aphasia, right-sided weakness  Brief Narrative: Patient is a 40 year old female from [place] with no significant past medical history was brought to the emergency department with complaints of aphasia, right facial droop, right-sided weakness.  She was also found to be confused and code stroke was called in the emergency department.  She was also complaining of mild headache since last few days, also complained of severe menorrhagia.  She also had a generalized tonic-clonic seizure in the emergency department.  CT head done in the emergency department showed possible left-sided brain mass with edema. 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   metastatic process.  Neurosurgery, neurology consulted. She was loaded with Keppra on presentation.  Lab work also showed hemoglobin of 6, transfused with PRBC. She underwent open brain biopsy by neurosurgery on 10/02/2020.  Pending biopsy report/cultures  Assessment & Plan:   Principal Problem:   Intracranial mass Active Problems:   Seizure (HCC)   Microcytic hypochromic anemia   Brain mass   Ankle pain   AIDS (acquired immune deficiency syndrome) (HCC)   Uterine mass   Perirectal abscess   Brain tumor ([place])   MRI contraindicated due to metal implant   Intracranial mass:  CT head done in the emergency department showed possible left-sided brain mass with edema. 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    Neurosurgery was consulted.  She was loaded with Keppra on presentation.  Also started on Decadron. Lumbar puncture not possible due to possible increased intracranial pressure/midline shift. She underwent open brain biopsy by neurosurgery on 10/02/2020.   Pending biopsy report: Follow-up toxoplasma,  AFB,  Pathology,fungus Cystic lesions suspicious for toxoplasma, but cannot rule out malignancy.  Toxoplasma IgG positive.  Started on IV Bactrim.  HIV 1:  Low  CD4 of less than 35.  Toxoplasma IgG  Positive,IgM negative. Negative  QuantiFERON.reactive RPR,negative  cryptococcus.  ID following. She was complaining of blurry vision earlier but now is saying that her vision is clear.  She also has a diffuse maculopapular skin lesions, but these lesions are healing, do not look active.  Bradycardia: Patient persistently bradycardic with heart rate in the range of 40-60.  She is asymptomatic. echocardiogram showed ejection fraction of 55 to 60%, no other abnormalities.  Monitor on telemetry.  Denies any lightheadedness or dizziness  Seizure: Had an episode of tonic-clonic seizure in the emergency department most likely acid with the brain mass/cystic lesion.  Neurology was following.  EEG showed cortical dysfunction in the left temporal region, moderate diffuse encephalopathy, no seizures or epileptiform discharges.  Continue Keppra 750 mg twice a day.  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   MRI abd/pelvis showed bulky enlargement of the uterus with multiple uterine masses, the largest of which measures 10 x 10 x 9.6 cm with suspicion for leiomyosarcoma.Also showed  paramidline pelvic floor/Peri coccygeal and perirectal cystic area measuring 5.3 x 3.3 cm with mildly thickened wall,possible  tail gut cyst.  She denies any rectal pain or rectal bleeding or discomfort. We discussed with oncology about this finding.  We will follow-up pathology from the brain biopsy.  Acute  metabolic encephalopathy: Likely precipitated by seizure.  Currently  alert and oriented   microcytic  hypochromic anemia: No previous labs to compare.  Was having heavy menstrual cycles.  She was transfused with  PRBCs during this hospitalization.  Monitor CBC.  Hemoglobin currently stable at 8.  Iron studies showed low iron, given a dose of iron infusion.  Continue oral supplementation.  Hypokalemia: Supplemented with potassium.  Debility/deconditioning: PT/OT evaluation requested.  PT /OT did not recommend any follow-up.  Nutrition Problem: Increased nutrient needs Etiology: acute illness      DVT prophylaxis:SCD Code Status: Full Family Communication: Sister at the bedside on 10/05/20 Status is: Inpatient  Remains inpatient appropriate because:Inpatient level of care appropriate due to severity of illness   Dispo: The patient is from: Home              Anticipated d/c is to: Home              Patient currently is not medically stable to d/c.   Difficult to place patient No     Consultants: ID, neurosurgery  Procedures:  Antimicrobials:  Anti-infectives (From admission, onward)   Start     Dose/Rate Route Frequency Ordered Stop   10/03/20 1815  sulfamethoxazole-trimethoprim (BACTRIM) 448.96 mg in dextrose 5 % 500 mL IVPB        10 mg/kg/day  89.8 kg 352 mL/hr over 90 Minutes Intravenous Every 12 hours 10/03/20 1725     10/02/20 1630  ceFAZolin (ANCEF) IVPB 2g/100 mL premix        2 g 200 mL/hr over 30 Minutes Intravenous Every 8 hours 10/02/20 1533 10/03/20 0051   10/02/20 1038  ceFAZolin (ANCEF) 2-4 GM/100ML-% IVPB       Note to Pharmacy: Laurita Quint   : cabinet override      10/02/20 1038 10/02/20 2244      Subjective:  Patient seen and examined the bedside this morning.  Comfortable.  Hemodynamically stable.  Denies any complaints  Objective: Vitals:   10/05/20 1653 10/05/20 1946 10/05/20 2357 10/06/20 0401  BP: 108/86  131/86 111/65  Pulse: (!) 53  61 (!) 49  Resp: 17  20 14   Temp: 97.6 F (36.4 C) 98.3 F (36.8 C) 97.7 F (36.5 C) 97.7 F (36.5 C)   TempSrc: Oral Oral Oral Oral  SpO2: 99%  100% 100%  Weight:      Height:        Intake/Output Summary (Last 24 hours) at 10/06/2020 0736 Last data filed at 10/05/2020 1213 Gross per 24 hour  Intake 860 ml  Output --  Net 860 ml   Filed Weights   09/29/20 1925  Weight: 89.8 kg    Examination:  General exam: Overall comfortable, not in distress HEENT: PERRL, sutures on the scalp Respiratory system:  no wheezes or crackles  Cardiovascular system: S1 & S2 heard, RRR.  Gastrointestinal system: Abdomen is nondistended, soft and nontender. Central nervous system: Alert and oriented Extremities: No edema, no clubbing ,no cyanosis Skin: Dry/healing  maculopapular rash on the face, trunk and upper extremities, no ulcers,no icterus   Data Reviewed: I have personally reviewed following labs and imaging studies  CBC: Recent Labs  Lab 09/30/20 0555 10/01/20 0744 10/02/20 0456 10/02/20 1117 10/03/20 0420 10/05/20 0359  WBC 3.1* 4.9 4.4  --  7.2 4.6  NEUTROABS 2.0 3.1 3.4  --  5.8 2.3  HGB 7.1* 7.3* 7.1* 9.2* 8.1* 7.7*  HCT 25.5* 25.7* 25.5* 27.0* 28.0* 26.9*  MCV  63.1* 62.5* 64.6*  --  65.9* 66.7*  PLT 183 219 196  --  187 119   Basic Metabolic Panel: Recent Labs  Lab 09/30/20 0555 10/01/20 0744 10/01/20 2222 10/02/20 0456 10/02/20 1117 10/03/20 0420 10/05/20 0359  NA 136 137  --  136 140 138 135  K 3.8 3.3*  --  4.4 3.9 4.0 3.1*  CL 109 108  --  109  --  109 105  CO2 20* 24  --  21*  --  21* 24  GLUCOSE 104* 99  --  115*  --  111* 83  BUN 6 11  --  12  --  15 11  CREATININE 0.48 0.58  --  0.49  --  0.59 0.79  CALCIUM 8.5* 8.8*  --  8.8*  --  8.4* 8.4*  MG  --   --  2.2  --   --   --   --   PHOS  --   --  3.6  --   --   --   --    GFR: Estimated Creatinine Clearance: 109.6 mL/min (by C-G formula based on SCr of 0.79 mg/dL). Liver Function Tests: Recent Labs  Lab 09/29/20 1814 09/30/20 0555  AST 41 33  ALT 27 24  ALKPHOS 66 60  BILITOT 0.6 0.7  PROT 9.7*  9.0*  ALBUMIN 3.6 3.2*   No results for input(s): LIPASE, AMYLASE in the last 168 hours. No results for input(s): AMMONIA in the last 168 hours. Coagulation Profile: Recent Labs  Lab 09/29/20 1814  INR 1.1   Cardiac Enzymes: No results for input(s): CKTOTAL, CKMB, CKMBINDEX, TROPONINI in the last 168 hours. BNP (last 3 results) No results for input(s): PROBNP in the last 8760 hours. HbA1C: No results for input(s): HGBA1C in the last 72 hours. CBG: Recent Labs  Lab 10/02/20 1559 10/02/20 2333 10/03/20 1228 10/03/20 1744 10/03/20 2324  GLUCAP 115* 127* 106* 113* 129*   Lipid Profile: No results for input(s): CHOL, HDL, LDLCALC, TRIG, CHOLHDL, LDLDIRECT in the last 72 hours. Thyroid Function Tests: No results for input(s): TSH, T4TOTAL, FREET4, T3FREE, THYROIDAB in the last 72 hours. Anemia Panel: No results for input(s): VITAMINB12, FOLATE, FERRITIN, TIBC, IRON, RETICCTPCT in the last 72 hours. Sepsis Labs: No results for input(s): PROCALCITON, LATICACIDVEN in the last 168 hours.  Recent Results (from the past 240 hour(s))  Resp Panel by RT-PCR (Flu A&B, Covid) Nasopharyngeal Swab     Status: None   Collection Time: 09/29/20 10:45 PM   Specimen: Nasopharyngeal Swab; Nasopharyngeal(NP) swabs in vial transport medium  Result Value Ref Range Status   SARS Coronavirus 2 by RT PCR NEGATIVE NEGATIVE Final    Comment: (NOTE) SARS-CoV-2 target nucleic acids are NOT DETECTED.  The SARS-CoV-2 RNA is generally detectable in upper respiratory specimens during the acute phase of infection. The lowest concentration of SARS-CoV-2 viral copies this assay can detect is 138 copies/mL. A negative result does not preclude SARS-Cov-2 infection and should not be used as the sole basis for treatment or other patient management decisions. A negative result may occur with  improper specimen collection/handling, submission of specimen other than nasopharyngeal swab, presence of viral  mutation(s) within the areas targeted by this assay, and inadequate number of viral copies(<138 copies/mL). A negative result must be combined with clinical observations, patient history, and epidemiological information. The expected result is Negative.  Fact Sheet for Patients:  EntrepreneurPulse.com.au  Fact Sheet for Healthcare Providers:  IncredibleEmployment.be  This test is  no t yet approved or cleared by the [place] and  has been authorized for detection and/or diagnosis of SARS-CoV-2 by FDA under an Emergency Use Authorization (EUA). This EUA will remain  in effect (meaning this test can be used) for the duration of the COVID-19 declaration under Section 564(b)(1) of the Act, 21 U.S.C.section 360bbb-3(b)(1), unless the authorization is terminated  or revoked sooner.       Influenza A by PCR NEGATIVE NEGATIVE Final   Influenza B by PCR NEGATIVE NEGATIVE Final    Comment: (NOTE) The Xpert Xpress SARS-CoV-2/FLU/RSV plus assay is intended as an aid in the diagnosis of influenza from Nasopharyngeal swab specimens and should not be used as a sole basis for treatment. Nasal washings and aspirates are unacceptable for Xpert Xpress SARS-CoV-2/FLU/RSV testing.  Fact Sheet for Patients: EntrepreneurPulse.com.au  Fact Sheet for Healthcare Providers: IncredibleEmployment.be  This test is not yet approved or cleared by the [place] FDA and has been authorized for detection and/or diagnosis of SARS-CoV-2 by FDA under an Emergency Use Authorization (EUA). This EUA will remain in effect (meaning this test can be used) for the duration of the COVID-19 declaration under Section 564(b)(1) of the Act, 21 U.S.C. section 360bbb-3(b)(1), unless the authorization is terminated or revoked.  Performed at [facility], [place] [address], [place], [place] [address]   MRSA PCR Screening     Status: None    Collection Time: 09/30/20  1:07 AM   Specimen: Nasal Mucosa; Nasopharyngeal  Result Value Ref Range Status   MRSA by PCR NEGATIVE NEGATIVE Final    Comment:        The GeneXpert MRSA Assay (FDA approved for NASAL specimens only), is one component of a comprehensive MRSA colonization surveillance program. It is not intended to diagnose MRSA infection nor to guide or monitor treatment for MRSA infections. Performed at [facility], [place] [address], [place], [place] [address]   Culture, fungus without smear     Status: None (Preliminary result)   Collection Time: 10/02/20 12:36 PM   Specimen: PATH Other; Tissue  Result Value Ref Range Status   Specimen Description BIOPSY  Final   Special Requests LEFT POSTERIOR BRAIN  Final   Culture   Final    NO FUNGUS ISOLATED AFTER 3 DAYS Performed at [facility], [place] [address], [place], [place] [address]    Report Status PENDING  Incomplete         Radiology Studies: No results found.      Scheduled Meds: . sodium chloride   Intravenous Once  . Chlorhexidine Gluconate Cloth  6 each Topical Daily  . dexamethasone (DECADRON) injection  4 mg Intravenous Q8H  . docusate sodium  100 mg Oral BID  . feeding supplement  237 mL Oral BID BM  . ferrous sulfate  325 mg Oral Q breakfast  . levETIRAcetam  750 mg Oral BID  . nystatin  5 mL Oral QID  . pantoprazole (PROTONIX) IV  40 mg Intravenous QHS  . sodium chloride flush  3 mL Intravenous Once   Continuous Infusions: . sulfamethoxazole-trimethoprim 448.96 mg (10/05/20 2119)     LOS: 6 days    Time spent: 35 mins,More than 50% of that time was spent in counseling and/or coordination of care.      Shelly Coss, MD [facility] P4/25/2022, 7:36 AM

## 2020-10-06 NOTE — Progress Notes (Signed)
Pharmacy Antibiotic Note  Argie Sharaya Boruff is a 40 y.o. female admitted on 09/29/2020 with possible Toxoplasmosis. Pharmacy has been consulted for sulfamethoxazole-trimethoprim dosing. Scr stable at 0.73, K 3.7.  Recommended dose is 10 mg/kg/day divided q12hr. Weight 89.8 kg  Plan: Sulfamethoxazole-trimethoprim 449 mg (5mg /kg) IV q12h Monitor renal function, potassium, c/s, LOT  Height: 5\' 8"  (172.7 cm) Weight: 89.8 kg (197 lb 15.6 oz) IBW/kg (Calculated) : 63.9  Temp (24hrs), Avg:97.8 F (36.6 C), Min:97.6 F (36.4 C), Max:98.3 F (36.8 C)  Recent Labs  Lab 10/01/20 0744 10/02/20 0456 10/03/20 0420 10/05/20 0359 10/06/20 0754  WBC 4.9 4.4 7.2 4.6 4.4  CREATININE 0.58 0.49 0.59 0.79 0.73    Estimated Creatinine Clearance: 109.6 mL/min (by C-G formula based on SCr of 0.73 mg/dL).    No Known Allergies   Arturo Morton, PharmD, BCPS Please check AMION for all [facility] contact numbers Clinical Pharmacist 10/06/2020 11:19 AM

## 2020-10-07 ENCOUNTER — Telehealth: Payer: Self-pay

## 2020-10-07 ENCOUNTER — Inpatient Hospital Stay (HOSPITAL_COMMUNITY): Payer: Medicaid Other

## 2020-10-07 ENCOUNTER — Encounter (HOSPITAL_COMMUNITY): Payer: Self-pay | Admitting: Neurosurgery

## 2020-10-07 ENCOUNTER — Other Ambulatory Visit (HOSPITAL_COMMUNITY): Payer: Self-pay

## 2020-10-07 MED ORDER — MIDAZOLAM HCL 2 MG/2ML IJ SOLN
INTRAMUSCULAR | Status: DC | PRN
  Administered 2020-10-07 (×3): 1 mg via INTRAVENOUS

## 2020-10-07 MED ORDER — LEUCOVORIN CALCIUM 25 MG PO TABS
25.0000 mg | ORAL_TABLET | Freq: Every day | ORAL | 0 refills | Status: DC
  Filled 2020-10-07: qty 30, 30d supply, fill #0

## 2020-10-07 MED ORDER — SULFADIAZINE 500 MG PO TABS
1500.0000 mg | ORAL_TABLET | Freq: Four times a day (QID) | ORAL | 0 refills | Status: DC
  Filled 2020-10-07: qty 360, 30d supply, fill #0

## 2020-10-07 MED ORDER — LIDOCAINE HCL 1 % IJ SOLN
INTRAMUSCULAR | Status: AC
  Filled 2020-10-07: qty 20

## 2020-10-07 MED ORDER — FENTANYL CITRATE (PF) 100 MCG/2ML IJ SOLN
INTRAMUSCULAR | Status: AC
  Filled 2020-10-07: qty 4

## 2020-10-07 MED ORDER — FENTANYL CITRATE (PF) 100 MCG/2ML IJ SOLN
INTRAMUSCULAR | Status: DC | PRN
  Administered 2020-10-07 (×3): 50 ug via INTRAVENOUS

## 2020-10-07 MED ORDER — SODIUM CHLORIDE 0.9% FLUSH
5.0000 mL | Freq: Three times a day (TID) | INTRAVENOUS | Status: DC
  Administered 2020-10-07 – 2020-10-10 (×8): 5 mL

## 2020-10-07 MED ORDER — MIDAZOLAM HCL 2 MG/2ML IJ SOLN
INTRAMUSCULAR | Status: AC
  Filled 2020-10-07: qty 4

## 2020-10-07 NOTE — Progress Notes (Signed)
[facility] for Infectious Disease  Date of Admission:  09/29/2020      Total days of antibiotics 5   bactrim           ASSESSMENT: Candace Jones is a 40 y.o. 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 Doing well on Bactrim IV for this. Waiting for pyrimethamine + sulfadiazine + leucovorin for optimal therapy to continue total 6 weeks.   All CNS symptoms have resolved, no need for corticosteroids at this point. Surgical incision is well-healing.   Regarding the perirectal cyst - plan for IR to drain today. Would send for routine bacterial and fungal cultures. Pathology if possible.   Medication Monitoring - SCr stable and potassium normal range.     PLAN: 1. Continue IV bactrim for now 2. Hopeful to get optimal regimen by Wednesday/Thrusday pyrimethamine (200 mg x 1 then 75 mg QD)+ sulfadiazine + leucovorin  3. Defer ART until she completes 2 weeks of treatment for toxo  4. Routine bacterial and fungal cultures. Pathology for aspirate if possible.  5. 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 sodium chloride   Intravenous Once  . Chlorhexidine Gluconate Cloth  6 each Topical Daily  . dexamethasone (DECADRON) injection  4 mg Intravenous Q8H  . docusate sodium  100 mg Oral BID  . feeding supplement  237 mL Oral BID BM  . ferrous sulfate  325 mg Oral Q breakfast  . levETIRAcetam  750 mg Oral BID  . lidocaine      . nystatin  5 mL Oral QID  . pantoprazole  40 mg Oral QHS    SUBJECTIVE: Sisters present - permission to discuss with them at the bedside. [place] interpretor via [employer] tablet  to facilitate.   She is in good spirits today. Continues to have no headaches, vision changes, weakness.   Plan for IR aspirate / drain for perirectal abscess.     Review of Systems: Review of Systems  Constitutional: Negative for chills, fever and malaise/fatigue.  HENT: Negative for ear pain, hearing loss, sore throat and tinnitus.   Eyes: Negative for blurred vision, double vision, photophobia and pain.  Respiratory: Negative for cough, sputum production and shortness of breath.   Cardiovascular: Negative for chest pain.  Gastrointestinal: Negative for abdominal pain, blood in stool, diarrhea, nausea and vomiting.  Genitourinary: Negative for dysuria.  Musculoskeletal: Negative for myalgias and neck pain.  Skin: Positive for itching and rash.  Neurological: Negative for dizziness, speech change, focal weakness, weakness and headaches.  Psychiatric/Behavioral: Negative for depression. The patient is not nervous/anxious.     No Known Allergies  OBJECTIVE: Vitals:   10/07/20 1335 10/07/20 1340 10/07/20 1345 10/07/20 1350  BP: 129/88 122/87 118/85 133/87  Pulse: 65 64 67 87  Resp: 15 12 12 14   Temp:      TempSrc:      SpO2: 100% 100% 100% 97%  Weight:      Height:       Body mass index is 30.1 kg/m.  Physical Exam Constitutional:  Appearance: She is well-developed.     Comments: Well appearing today sitting up in bed. Pleasant, smiling  HENT:     Head:     Comments: Incision to left temporal lobe with clean/intact honeycomb dressing    Mouth/Throat:     Mouth: No oral lesions.     Dentition: Normal dentition. No dental abscesses.     Pharynx: No oropharyngeal exudate.  Eyes:     General: No scleral icterus.    Pupils: Pupils are equal, round, and reactive to light.  Cardiovascular:     Rate and Rhythm: Normal rate and regular rhythm.     Heart sounds: Normal heart sounds.  Pulmonary:     Effort: Pulmonary effort is normal.     Breath sounds: Normal breath  sounds.  Abdominal:     General: There is no distension.     Palpations: Abdomen is soft.     Tenderness: There is no abdominal tenderness.  Musculoskeletal:        General: Normal range of motion.     Cervical back: Normal range of motion.  Lymphadenopathy:     Cervical: No cervical adenopathy.  Skin:    General: Skin is warm and dry.     Capillary Refill: Capillary refill takes less than 2 seconds.     Findings: Rash present.     Comments: Scattered hyperpigmented macules / modules, some appear umbilicated on the extremities, some flat.  Noted and more concentrated on bilateral cheeks.   Neurological:     General: No focal deficit present.     Mental Status: She is alert and oriented to person, place, and time.     Cranial Nerves: No cranial nerve deficit.     Sensory: No sensory deficit.  Psychiatric:        Judgment: Judgment normal.     Comments: In good spirits today and engaged in care discussion      Lab Results Lab Results  Component Value Date   WBC 4.4 10/06/2020   HGB 8.8 (L) 10/06/2020   HCT 30.5 (L) 10/06/2020   MCV 68.4 (L) 10/06/2020   PLT 193 10/06/2020    Lab Results  Component Value Date   CREATININE 0.73 10/06/2020   BUN 8 10/06/2020   NA 134 (L) 10/06/2020   K 3.7 10/06/2020   CL 105 10/06/2020   CO2 24 10/06/2020    Lab Results  Component Value Date   ALT 24 09/30/2020   AST 33 09/30/2020   ALKPHOS 60 09/30/2020   BILITOT 0.7 09/30/2020     Microbiology: Recent Results (from the past 240 hour(s))  Resp Panel by RT-PCR (Flu A&B, Covid) Nasopharyngeal Swab     Status: None   Collection Time: 09/29/20 10:45 PM   Specimen: Nasopharyngeal Swab; Nasopharyngeal(NP) swabs in vial transport medium  Result Value Ref Range Status   SARS Coronavirus 2 by RT PCR NEGATIVE NEGATIVE Final    Comment: (NOTE) SARS-CoV-2 target nucleic acids are NOT DETECTED.  The SARS-CoV-2 RNA is generally detectable in upper respiratory specimens during the acute  phase of infection. The lowest concentration of SARS-CoV-2 viral copies this assay can detect is 138 copies/mL. A negative result does not preclude SARS-Cov-2 infection and should not be used as the sole basis for treatment or other patient management decisions. A negative result may occur with  improper specimen collection/handling, submission of specimen other than nasopharyngeal swab, presence of viral mutation(s) within the areas targeted by this assay, and inadequate number of  viral copies(<138 copies/mL). A negative result must be combined with clinical observations, patient history, and epidemiological information. The expected result is Negative.  Fact Sheet for Patients:  EntrepreneurPulse.com.au  Fact Sheet for Healthcare Providers:  IncredibleEmployment.be  This test is no t yet approved or cleared by the [place] FDA and  has been authorized for detection and/or diagnosis of SARS-CoV-2 by FDA under an Emergency Use Authorization (EUA). This EUA will remain  in effect (meaning this test can be used) for the duration of the COVID-19 declaration under Section 564(b)(1) of the Act, 21 U.S.C.section 360bbb-3(b)(1), unless the authorization is terminated  or revoked sooner.       Influenza A by PCR NEGATIVE NEGATIVE Final   Influenza B by PCR NEGATIVE NEGATIVE Final    Comment: (NOTE) The Xpert Xpress SARS-CoV-2/FLU/RSV plus assay is intended as an aid in the diagnosis of influenza from Nasopharyngeal swab specimens and should not be used as a sole basis for treatment. Nasal washings and aspirates are unacceptable for Xpert Xpress SARS-CoV-2/FLU/RSV testing.  Fact Sheet for Patients: EntrepreneurPulse.com.au  Fact Sheet for Healthcare Providers: IncredibleEmployment.be  This test is not yet approved or cleared by the [place] FDA and has been authorized for detection and/or diagnosis of  SARS-CoV-2 by FDA under an Emergency Use Authorization (EUA). This EUA will remain in effect (meaning this test can be used) for the duration of the COVID-19 declaration under Section 564(b)(1) of the Act, 21 U.S.C. section 360bbb-3(b)(1), unless the authorization is terminated or revoked.  Performed at [facility], [place] [address], [place], [place] [address]   MRSA PCR Screening     Status: None   Collection Time: 09/30/20  1:07 AM   Specimen: Nasal Mucosa; Nasopharyngeal  Result Value Ref Range Status   MRSA by PCR NEGATIVE NEGATIVE Final    Comment:        The GeneXpert MRSA Assay (FDA approved for NASAL specimens only), is one component of a comprehensive MRSA colonization surveillance program. It is not intended to diagnose MRSA infection nor to guide or monitor treatment for MRSA infections. Performed at [facility], [place] [address] [place], [place] [address]   Culture, fungus without smear     Status: None (Preliminary result)   Collection Time: 10/02/20 12:36 PM   Specimen: PATH Other; Tissue  Result Value Ref Range Status   Specimen Description BIOPSY  Final   Special Requests LEFT POSTERIOR BRAIN  Final   Culture   Final    NO FUNGUS ISOLATED AFTER 5 DAYS Performed at [facility], [place] [address], [place], [place] [address]    Report Status PENDING  Incomplete  Acid Fast Smear (AFB)     Status: None   Collection Time: 10/02/20 12:36 PM   Specimen: PATH Other; Tissue  Result Value Ref Range Status   AFB Specimen Processing Comment  Final    Comment: Tissue Grinding and Digestion/Decontamination   Acid Fast Smear Negative  Final    Comment: (NOTE) Performed At: [facility] [place], [place] [id number] Rush Farmer MD OZ:[id number]    Source (AFB) BIOPSY  Final    Comment: LEFT POSTERIOR BRAIN Performed at [facility], [place] [address] [place], [place] [address]      Janene Madeira, MSN, NP-C [facility] for Infectious Disease [email or web address] Pager: [phone] Office: [phone] [place]: [phone]

## 2020-10-07 NOTE — Progress Notes (Signed)
Subjective: The patient is alert and pleasant.  She has no complaints.  She looks well.  Objective: Vital signs in last 24 hours: Temp:  [97.4 F (36.3 C)-98 F (36.7 C)] 97.9 F (36.6 C) (04/26 0333) Pulse Rate:  [50-60] 60 (04/25 1500) Resp:  [15-20] 20 (04/26 0333) BP: (105-121)/(68-81) 109/72 (04/26 0333) SpO2:  [98 %-100 %] 100 % (04/26 0333) Estimated body mass index is 30.1 kg/m as calculated from the following:   Height as of this encounter: 5\' 8"  (1.727 m).   Weight as of this encounter: 89.8 kg.   Intake/Output from previous day: No intake/output data recorded. Intake/Output this shift: No intake/output data recorded.  Physical exam the patient is alert and pleasant.  Her speech is normal.  She is moving all 4 extremities well.  Her wound is healing well.  Lab Results: Recent Labs    10/05/20 0359 10/06/20 0754  WBC 4.6 4.4  HGB 7.7* 8.8*  HCT 26.9* 30.5*  PLT 172 193   BMET Recent Labs    10/05/20 0359 10/06/20 0754  NA 135 134*  K 3.1* 3.7  CL 105 105  CO2 24 24  GLUCOSE 83 105*  BUN 11 8  CREATININE 0.79 0.73  CALCIUM 8.4* 9.0    Studies/Results: No results found.  Assessment/Plan: Status post brain biopsy: The patient is doing well.  She is okay for transfer to the floor my point of view.  We are awaiting the pathology results.  LOS: 7 days     Ophelia Charter 10/07/2020, 7:55 AM

## 2020-10-07 NOTE — Progress Notes (Signed)
PROGRESS NOTE    Candace Jones  [id number] DOB: 03/22/1981 DOA: 09/29/2020 PCP: Patient, No Pcp Per (Inactive)   Chief Complain: Aphasia, right-sided weakness  Brief Narrative: Patient is a 40 year old female from [place] with no significant past medical history was brought to the emergency department with complaints of aphasia, right facial droop, right-sided weakness.  She was also found to be confused and code stroke was called in the emergency department.  She was also complaining of mild headache since last few days, also complained of severe menorrhagia.  She also had a generalized tonic-clonic seizure in the emergency department.  CT head done in the emergency department showed possible left-sided brain mass with edema. 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   metastatic process.  Neurosurgery, neurology consulted. She was loaded with Keppra on presentation.  Lab work also showed hemoglobin of 6, transfused with PRBC. She underwent open brain biopsy by neurosurgery on 10/02/2020.  Biopsy consistent of toxoplasmosis.  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    Neurosurgery was consulted.  She was loaded with Keppra on presentation.  Also started on Decadron. Lumbar puncture not possible due to possible increased intracranial pressure/midline shift. She underwent open brain biopsy by neurosurgery  on 10/02/2020.  Biopsy positive for toxoplasma.  Toxoplasma IgG positive.  Started on IV Bactrim.ID following and planning to change the antibiotics to oral soon  HIV 1:  Low  CD4 of less than 35.  Toxoplasma IgG  Positive,IgM negative. Negative  QuantiFERON.reactive RPR,negative  cryptococcus.  ID following. She was complaining of blurry vision earlier but now is saying that her vision is clear.  She also has a diffuse maculopapular skin lesions, but these lesions are healing, do not look active.  Bradycardia: Patient was persistently bradycardic with heart rate in the range of 40-60.  She is asymptomatic. echocardiogram showed ejection fraction of 55 to 60%, no other abnormalities.  Monitor on telemetry.  Denies any lightheadedness or dizziness.  HR stable now  Seizure: Had an episode of tonic-clonic seizure in the emergency department most likely acid with the brain mass/cystic lesion.  Neurology was following.  EEG showed cortical dysfunction in the left temporal region, moderate diffuse encephalopathy, no seizures or epileptiform discharges.  Continue Keppra 750 mg twice a day.  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   MRI abd/pelvis showed bulky enlargement of the uterus with multiple uterine masses, the largest of which measures 10 x 10 x 9.6 cm with suspicion for leiomyosarcoma.Also showed  paramidline pelvic floor/Peri coccygeal and perirectal cystic area measuring 5.3 x 3.3 cm with mildly thickened wall,possible  tail gut cyst.  She denies any rectal pain or rectal bleeding or discomfort. We discussed with  GYN oncology about this and they think the uterine finding is most  likely from fibroids not leiomyosarcoma. GYN recommended to follow-up as an outpatient. We also requested IR for  biopsy/sampling of the perirectal  cyst,this is being done today.  Acute metabolic encephalopathy: Likely precipitated by seizure,resolved  Currently  alert and oriented   microcytic hypochromic anemia: No previous labs to compare.  Was having heavy menstrual cycles.  She was transfused with  PRBCs during this hospitalization.  Monitor CBC.  Hemoglobin currently stable at 8.  Iron studies showed low iron, given a dose of iron infusion.  Continue oral supplementation.  Hypokalemia: Supplemented with potassium.  Debility/deconditioning: PT/OT evaluation requested.  PT /OT did not recommend any follow-up.  Nutrition Problem: Increased nutrient needs Etiology: acute illness      DVT prophylaxis:SCD Code Status: Full Family Communication: Sister at the bedside today Status is: Inpatient  Remains inpatient appropriate because:Inpatient level of care appropriate due to severity of illness   Dispo: The patient is from: Home              Anticipated d/c is to: Home              Patient currently is not medically stable to d/c.   Difficult to place patient No Plan for discharge to home after determination of oral antibiotics by ID and report from IR guided biopsy/sampling of the rectal cyst    Consultants: ID, neurosurgery  Procedures:  Antimicrobials:  Anti-infectives (From admission, onward)   Start     Dose/Rate Route Frequency Ordered Stop   10/03/20 1815  sulfamethoxazole-trimethoprim (BACTRIM) 448.96 mg in dextrose 5 % 500 mL IVPB        10 mg/kg/day  89.8 kg 352 mL/hr over 90 Minutes Intravenous Every 12 hours 10/03/20 1725     10/02/20 1630  ceFAZolin (ANCEF) IVPB 2g/100 mL premix        2 g 200 mL/hr over 30 Minutes Intravenous Every 8 hours 10/02/20 1533 10/03/20 0051   10/02/20 1038  ceFAZolin (ANCEF) 2-4 GM/100ML-% IVPB       Note to Pharmacy: Laurita Quint   : cabinet override      10/02/20 1038 10/02/20 2244      Subjective:  Patient seen and examined the bedside this morning.  Hemodynamically  stable.  Comfortable.  Denies any complaints today.  Sister was wondering about the discharge date.  We discussed about the plan for IR guided biopsy/sampling of the rectal cyst.  She denies any rectal discomfort or pain.  Objective: Vitals:   10/06/20 1954 10/06/20 2351 10/07/20 0333 10/07/20 0756  BP: 105/69 121/81 109/72 121/82  Pulse:    60  Resp: 20 18 20 15   Temp: 97.7 F (36.5 C) 97.7 F (36.5 C) 97.9 F (36.6 C) 97.6 F (36.4 C)  TempSrc: Oral Oral Oral Oral  SpO2: 100% 100% 100% 99%  Weight:      Height:       No intake or output data in the 24 hours ending 10/07/20 0804 Filed Weights   09/29/20 1925  Weight: 89.8 kg    Examination:  General exam: Overall comfortable, not in distress HEENT: PERRL, sutures on the scalp Respiratory system:  no wheezes or crackles  Cardiovascular system: S1 & S2 heard, RRR.  Gastrointestinal system: Abdomen is nondistended, soft and nontender. Central nervous system: Alert and oriented Extremities: No edema, no clubbing ,no cyanosis Skin:  Dry/healing  maculopapular rash on the face, trunk and upper extremities( looks like acne), no ulcers,no icterus  Data Reviewed: I have personally reviewed  following labs and imaging studies  CBC: Recent Labs  Lab 10/01/20 0744 10/02/20 0456 10/02/20 1117 10/03/20 0420 10/05/20 0359 10/06/20 0754  WBC 4.9 4.4  --  7.2 4.6 4.4  NEUTROABS 3.1 3.4  --  5.8 2.3 3.2  HGB 7.3* 7.1* 9.2* 8.1* 7.7* 8.8*  HCT 25.7* 25.5* 27.0* 28.0* 26.9* 30.5*  MCV 62.5* 64.6*  --  65.9* 66.7* 68.4*  PLT 219 196  --  187 172 268   Basic Metabolic Panel: Recent Labs  Lab 10/01/20 0744 10/01/20 2222 10/02/20 0456 10/02/20 1117 10/03/20 0420 10/05/20 0359 10/06/20 0754  NA 137  --  136 140 138 135 134*  K 3.3*  --  4.4 3.9 4.0 3.1* 3.7  CL 108  --  109  --  109 105 105  CO2 24  --  21*  --  21* 24 24  GLUCOSE 99  --  115*  --  111* 83 105*  BUN 11  --  12  --  15 11 8   CREATININE 0.58  --  0.49  --   0.59 0.79 0.73  CALCIUM 8.8*  --  8.8*  --  8.4* 8.4* 9.0  MG  --  2.2  --   --   --   --   --   PHOS  --  3.6  --   --   --   --   --    GFR: Estimated Creatinine Clearance: 109.6 mL/min (by C-G formula based on SCr of 0.73 mg/dL). Liver Function Tests: No results for input(s): AST, ALT, ALKPHOS, BILITOT, PROT, ALBUMIN in the last 168 hours. No results for input(s): LIPASE, AMYLASE in the last 168 hours. No results for input(s): AMMONIA in the last 168 hours. Coagulation Profile: No results for input(s): INR, PROTIME in the last 168 hours. Cardiac Enzymes: No results for input(s): CKTOTAL, CKMB, CKMBINDEX, TROPONINI in the last 168 hours. BNP (last 3 results) No results for input(s): PROBNP in the last 8760 hours. HbA1C: No results for input(s): HGBA1C in the last 72 hours. CBG: Recent Labs  Lab 10/02/20 1559 10/02/20 2333 10/03/20 1228 10/03/20 1744 10/03/20 2324  GLUCAP 115* 127* 106* 113* 129*   Lipid Profile: No results for input(s): CHOL, HDL, LDLCALC, TRIG, CHOLHDL, LDLDIRECT in the last 72 hours. Thyroid Function Tests: No results for input(s): TSH, T4TOTAL, FREET4, T3FREE, THYROIDAB in the last 72 hours. Anemia Panel: No results for input(s): VITAMINB12, FOLATE, FERRITIN, TIBC, IRON, RETICCTPCT in the last 72 hours. Sepsis Labs: No results for input(s): PROCALCITON, LATICACIDVEN in the last 168 hours.  Recent Results (from the past 240 hour(s))  Resp Panel by RT-PCR (Flu A&B, Covid) Nasopharyngeal Swab     Status: None   Collection Time: 09/29/20 10:45 PM   Specimen: Nasopharyngeal Swab; Nasopharyngeal(NP) swabs in vial transport medium  Result Value Ref Range Status   SARS Coronavirus 2 by RT PCR NEGATIVE NEGATIVE Final    Comment: (NOTE) SARS-CoV-2 target nucleic acids are NOT DETECTED.  The SARS-CoV-2 RNA is generally detectable in upper respiratory specimens during the acute phase of infection. The lowest concentration of SARS-CoV-2 viral copies this  assay can detect is 138 copies/mL. A negative result does not preclude SARS-Cov-2 infection and should not be used as the sole basis for treatment or other patient management decisions. A negative result may occur with  improper specimen collection/handling, submission of specimen other than nasopharyngeal swab, presence of viral mutation(s) within the areas targeted by this assay, and inadequate number  of viral copies(<138 copies/mL). A negative result must be combined with clinical observations, patient history, and epidemiological information. The expected result is Negative.  Fact Sheet for Patients:  EntrepreneurPulse.com.au  Fact Sheet for Healthcare Providers:  IncredibleEmployment.be  This test is no t yet approved or cleared by the [place] FDA and  has been authorized for detection and/or diagnosis of SARS-CoV-2 by FDA under an Emergency Use Authorization (EUA). This EUA will remain  in effect (meaning this test can be used) for the duration of the COVID-19 declaration under Section 564(b)(1) of the Act, 21 U.S.C.section 360bbb-3(b)(1), unless the authorization is terminated  or revoked sooner.       Influenza A by PCR NEGATIVE NEGATIVE Final   Influenza B by PCR NEGATIVE NEGATIVE Final    Comment: (NOTE) The Xpert Xpress SARS-CoV-2/FLU/RSV plus assay is intended as an aid in the diagnosis of influenza from Nasopharyngeal swab specimens and should not be used as a sole basis for treatment. Nasal washings and aspirates are unacceptable for Xpert Xpress SARS-CoV-2/FLU/RSV testing.  Fact Sheet for Patients: EntrepreneurPulse.com.au  Fact Sheet for Healthcare Providers: IncredibleEmployment.be  This test is not yet approved or cleared by the [place] FDA and has been authorized for detection and/or diagnosis of SARS-CoV-2 by FDA under an Emergency Use Authorization (EUA). This EUA will  remain in effect (meaning this test can be used) for the duration of the COVID-19 declaration under Section 564(b)(1) of the Act, 21 U.S.C. section 360bbb-3(b)(1), unless the authorization is terminated or revoked.  Performed at [facility], [place] [address], [place], [place] [address]   MRSA PCR Screening     Status: None   Collection Time: 09/30/20  1:07 AM   Specimen: Nasal Mucosa; Nasopharyngeal  Result Value Ref Range Status   MRSA by PCR NEGATIVE NEGATIVE Final    Comment:        The GeneXpert MRSA Assay (FDA approved for NASAL specimens only), is one component of a comprehensive MRSA colonization surveillance program. It is not intended to diagnose MRSA infection nor to guide or monitor treatment for MRSA infections. Performed at [facility], [place] [address] [place], [place] [address]   Culture, fungus without smear     Status: None (Preliminary result)   Collection Time: 10/02/20 12:36 PM   Specimen: PATH Other; Tissue  Result Value Ref Range Status   Specimen Description BIOPSY  Final   Special Requests LEFT POSTERIOR BRAIN  Final   Culture   Final    No Fungi Isolated in 4 Weeks Performed at [facility] [address], [place], [place] [address]    Report Status PENDING  Incomplete  Acid Fast Smear (AFB)     Status: None   Collection Time: 10/02/20 12:36 PM   Specimen: PATH Other; Tissue  Result Value Ref Range Status   AFB Specimen Processing Comment  Final    Comment: Tissue Grinding and Digestion/Decontamination   Acid Fast Smear Negative  Final    Comment: (NOTE) Performed At: [facility] [place], [place] [id number] Rush Farmer MD [id number]    Source (AFB) BIOPSY  Final    Comment: LEFT POSTERIOR BRAIN Performed at [facility], [place] [address] [place], [place] [address]          Radiology Studies: No results found.      Scheduled Meds: . sodium chloride   Intravenous Once  .  Chlorhexidine Gluconate Cloth  6 each Topical  Daily  . dexamethasone (DECADRON) injection  4 mg Intravenous Q8H  . docusate sodium  100 mg Oral BID  . feeding supplement  237 mL Oral BID BM  . ferrous sulfate  325 mg Oral Q breakfast  . levETIRAcetam  750 mg Oral BID  . nystatin  5 mL Oral QID  . pantoprazole  40 mg Oral QHS   Continuous Infusions: . sulfamethoxazole-trimethoprim 448.96 mg (10/06/20 2122)     LOS: 7 days    Time spent: 35 mins,More than 50% of that time was spent in counseling and/or coordination of care.      Shelly Coss, MD [facility] P4/26/2022, 8:04 AM

## 2020-10-07 NOTE — Progress Notes (Signed)
Chief Complaint: Patient was seen in consultation today for aspiration/drainage of perirectal fluid collection/cyst  Referring Physician(s): Dr. Wray Kearns, NP  Supervising Physician: Mir, Sharen Heck  Patient Status: [facility] - In-pt  History of Present Illness: Candace Jones is a 40 y.o. 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 Candace Jones is also found to have perirectal cystic fluid collection. IR is asked to perform aspiration/drainage. PMHx, meds, labs, imaging, allergies reviewed. Feels much better today. Has been NPO today as directed. Family at bedside.   History reviewed. No pertinent past medical history.  Past Surgical History:  Procedure Laterality Date  . APPLICATION OF CRANIAL NAVIGATION Left 10/02/2020   Procedure: APPLICATION OF CRANIAL NAVIGATION;  Surgeon: Newman Pies, MD;  Location: [place];  Service: Neurosurgery;  Laterality: Left;  . CRANIOTOMY Left 10/02/2020   Procedure: LEFT CRANIOTOMY FOR TUMOR BIOPSY/ RESECTION with BrainLab;  Surgeon: Newman Pies, MD;  Location: [place];  Service: Neurosurgery;  Laterality: Left;    Allergies: Patient has no known allergies.  Medications:  Current Facility-Administered Medications:  .  0.9 %  sodium chloride infusion (Manually program via Guardrails IV Fluids), , Intravenous, Once, Newman Pies, MD .  acetaminophen (TYLENOL) tablet 650 mg, 650 mg, Oral, Q6H PRN **OR** acetaminophen (TYLENOL) suppository 650 mg, 650 mg, Rectal, Q6H PRN, Newman Pies, MD .  acetaminophen (TYLENOL) tablet 650 mg, 650 mg, Oral, Q4H PRN **OR** acetaminophen (TYLENOL) suppository 650 mg, 650 mg, Rectal, Q4H PRN, Newman Pies, MD .  Chlorhexidine Gluconate Cloth 2 % PADS 6 each, 6 each, Topical, Daily, Newman Pies, MD, 6 each at 10/04/20 1342 .  [COMPLETED] dexamethasone (DECADRON) injection 6 mg, 6 mg, Intravenous, Q6H **FOLLOWED BY** [COMPLETED] dexamethasone (DECADRON)  injection 4 mg, 4 mg, Intravenous, Q6H, 4 mg at 10/04/20 1243 **FOLLOWED BY** dexamethasone (DECADRON) injection 4 mg, 4 mg, Intravenous, Q8H, Newman Pies, MD, 4 mg at 10/07/20 0512 .  docusate sodium (COLACE) capsule 100 mg, 100 mg, Oral, BID, Newman Pies, MD, 100 mg at 10/07/20 0835 .  feeding supplement (ENSURE ENLIVE / ENSURE PLUS) liquid 237 mL, 237 mL, Oral, BID BM, Newman Pies, MD, 237 mL at 10/06/20 1341 .  ferrous sulfate tablet 325 mg, 325 mg, Oral, Q breakfast, Newman Pies, MD, 325 mg at 10/07/20 0834 .  hydrALAZINE (APRESOLINE) injection 10 mg, 10 mg, Intravenous, QID PRN, Shelly Coss, MD, 10 mg at 10/03/20 1917 .  HYDROcodone-acetaminophen (NORCO/VICODIN) 5-325 MG per tablet 1 tablet, 1 tablet, Oral, Q4H PRN, Newman Pies, MD .  levETIRAcetam (KEPPRA) tablet 750 mg, 750 mg, Oral, BID, Shelly Coss, MD, 750 mg at 10/07/20 0834 .  LORazepam (ATIVAN) injection 2 mg, 2 mg, Intravenous, PRN, Newman Pies, MD .  morphine 2 MG/ML injection 2-4 mg, 2-4 mg, Intravenous, Q2H PRN, Newman Pies, MD, 2 mg at 10/03/20 0435 .  nystatin (MYCOSTATIN) 100000 UNIT/ML suspension 500,000 Units, 5 mL, Oral, QID, Newman Pies, MD, 500,000 Units at 10/07/20 [phone] .  ondansetron (ZOFRAN) tablet 4 mg, 4 mg, Oral, Q4H PRN **OR** ondansetron (ZOFRAN) injection 4 mg, 4 mg, Intravenous, Q4H PRN, Newman Pies, MD .  pantoprazole (PROTONIX) EC tablet 40 mg, 40 mg, Oral, QHS, Adhikari, Amrit, MD, 40 mg at 10/06/20 2122 .  promethazine (PHENERGAN) tablet 12.5-25 mg, 12.5-25 mg, Oral, Q4H PRN, Newman Pies, MD .  sulfamethoxazole-trimethoprim (BACTRIM) 448.96 mg in dextrose 5 % 500 mL IVPB, 10 mg/kg/day, Intravenous, Q12H, Adhikari, Amrit, MD, Last Rate: 352 mL/hr at 10/07/20 0841, 448.96 mg at 10/07/20  0841    Family History  Family history unknown: Yes    Social History   Socioeconomic History  . Marital status: Single    Spouse name: Not on file  . Number of  children: Not on file  . Years of education: Not on file  . Highest education level: Not on file  Occupational History  . Not on file  Tobacco Use  . Smoking status: Never Smoker  . Smokeless tobacco: Never Used  Substance and Sexual Activity  . Alcohol use: Never  . Drug use: Never  . Sexual activity: Yes  Other Topics Concern  . Not on file  Social History Narrative  . Not on file   Social Determinants of Health   Financial Resource Strain: Not on file  Food Insecurity: Not on file  Transportation Needs: Not on file  Physical Activity: Not on file  Stress: Not on file  Social Connections: Not on file     Review of Systems: A 12 point ROS discussed and pertinent positives are indicated in the HPI above.  All other systems are negative.  Review of Systems  Vital Signs: BP 121/82 (BP Location: Left Arm)   Pulse 60   Temp 97.6 F (36.4 C) (Oral)   Resp 15   Ht 5\' 8"  (1.727 m)   Wt 89.8 kg   SpO2 99%   BMI 30.10 kg/m   Physical Exam Constitutional:      Appearance: Normal appearance. Candace Jones is not ill-appearing.  Cardiovascular:     Rate and Rhythm: Normal rate and regular rhythm.     Heart sounds: Normal heart sounds.  Pulmonary:     Effort: Pulmonary effort is normal. No respiratory distress.     Breath sounds: Normal breath sounds.  Abdominal:     General: Abdomen is flat.     Palpations: Abdomen is soft. There is no mass.  Neurological:     General: No focal deficit present.     Mental Status: Candace Jones is alert and oriented to person, place, and time.  Psychiatric:        Mood and Affect: Mood normal.        Thought Content: Thought content normal.        Judgment: Judgment normal.     Imaging: CT ANGIO HEAD W OR WO CONTRAST  Result Date: 09/29/2020 CLINICAL DATA:  Acute neuro deficit, aphasia.  Right-sided deficit EXAM: CT ANGIOGRAPHY HEAD AND NECK CT PERFUSION BRAIN TECHNIQUE: Multidetector CT imaging of the head and neck was performed using the  standard protocol during bolus administration of intravenous contrast. Multiplanar CT image reconstructions and MIPs were obtained to evaluate the vascular anatomy. Carotid stenosis measurements (when applicable) are obtained utilizing NASCET criteria, using the distal internal carotid diameter as the denominator. Multiphase CT imaging of the brain was performed following IV bolus contrast injection. Subsequent parametric perfusion maps were calculated using RAPID software. CONTRAST:  110mL OMNIPAQUE IOHEXOL 350 MG/ML SOLN COMPARISON:  CT head 09/29/2020 FINDINGS: CTA NECK FINDINGS Aortic arch: Standard branching. Imaged portion shows no evidence of aneurysm or dissection. No significant stenosis of the major arch vessel origins. Right carotid system: Normal right carotid. Negative for atherosclerotic disease. Left carotid system: Negative left carotid. Negative for atherosclerotic disease or stenosis. Vertebral arteries: Normal vertebral arteries bilaterally. Skeleton: No acute skeletal abnormality. Other neck: Negative for mass or enlarged lymph node. Upper chest: Mild ground-glass density in the right upper lobe without focal infiltrate or mass. Review of the MIP images confirms  the above findings CTA HEAD FINDINGS Anterior circulation: Normal anterior circulation. Negative for stenosis or large vessel occlusion. Posterior circulation: Normal posterior circulation. Negative for stenosis or large vessel occlusion. Venous sinuses: Normal venous enhancement. Anatomic variants: None Review of the MIP images confirms the above findings CT Brain Perfusion Findings: ASPECTS: 10 CBF (<30%) Volume: 46mL Perfusion (Tmax>6.0s) volume: 77mL Mismatch Volume: 65mL Infarction Location:Patchy decreased perfusion in the left frontal lobe and high right frontal lobe. IMPRESSION: Normal CT angiogram head neck. No significant atherosclerotic disease. No intracranial large vessel occlusion CT perfusion demonstrates small patchy areas  of diminished perfusion left frontal and right frontal lobe of uncertain significance. No fixed infarct. Electronically Signed   By: Franchot Gallo M.D.   On: 09/29/2020 18:55   DG Ankle 2 Views Right  Result Date: 09/30/2020 CLINICAL DATA:  Ankle pain EXAM: RIGHT ANKLE - 2 VIEW COMPARISON:  X-ray ankle 09/17/2019 FINDINGS: No evidence of fracture, dislocation, or joint effusion. Redemonstration of a sclerotic appearance of the navicular bone. Soft tissues are unremarkable. IMPRESSION: No acute displaced fracture or dislocation with redemonstration of a chronic sclerotic appearing navicular. As per previous x-ray, noncontrast CT or MRI of the right focal may be valuable for further evaluation if clinically indicated. Electronically Signed   By: Iven Finn M.D.   On: 09/30/2020 21:01   CT HEAD WO CONTRAST  Result Date: 10/02/2020 CLINICAL DATA:  Brain mass.  Preoperative planning. EXAM: CT HEAD WITHOUT CONTRAST TECHNIQUE: Contiguous axial images were obtained from the base of the skull through the vertex without intravenous contrast. COMPARISON:  MRI head 09/30/2020.  CT head 10/01/2020 FINDINGS: Brain: BrainLAB protocol for preoperative planning purposes. Large areas of edema in the left frontal white matter and left temporoparietal white matter appear unchanged from recent studies. Prior MRI demonstrated associated enhancing masses. Masses are difficult to see on unenhanced CT. There is mass-effect on the left cerebral hemisphere. 5 mm midline shift to the right similar to slightly improved from prior studies. No hydrocephalus identified.  No acute hemorrhage identified. Vascular: Negative for hyperdense vessel Skull: No focal lesion. Sinuses/Orbits: Mucosal edema paranasal sinuses. Bony thickening of the maxillary sinus bilaterally. Negative orbit Other: None IMPRESSION: Large areas of edema in the left frontal lobe and left temporoparietal lobe unchanged. Associated areas of enhancement noted on MRI.  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 No acute hemorrhage. 5 mm midline shift to the right similar to slightly improved. Electronically Signed   By: Franchot Gallo M.D.   On: 10/02/2020 09:41   CT HEAD WO CONTRAST  Result Date: 10/01/2020 CLINICAL DATA:  Brain mass EXAM: CT HEAD WITHOUT CONTRAST TECHNIQUE: Contiguous axial images were obtained from the base of the skull through the vertex without intravenous contrast. COMPARISON:  Brain MRI 09/30/2020 FINDINGS: Brain: Large areas of vasogenic edema in the left temporal lobe and left frontal lobe are unchanged. There is rightward midline shift 6 mm. No hydrocephalus. No acute hemorrhage. Vascular: No hyperdense vessel or unexpected calcification. Skull: Normal. Negative for fracture or focal lesion. Sinuses/Orbits: Right frontal sinus opacification Other: None. IMPRESSION: Unchanged appearance of large areas of vasogenic edema in the left temporal lobe and left frontal lobe with 6 mm rightward midline shift. Electronically Signed   By: Ulyses Jarred M.D.   On: 10/01/2020 23:09   CT ANGIO NECK W OR WO CONTRAST  Result Date: 09/29/2020 CLINICAL DATA:  Acute neuro deficit, aphasia.  Right-sided deficit EXAM: CT ANGIOGRAPHY HEAD AND NECK CT PERFUSION  BRAIN TECHNIQUE: Multidetector CT imaging of the head and neck was performed using the standard protocol during bolus administration of intravenous contrast. Multiplanar CT image reconstructions and MIPs were obtained to evaluate the vascular anatomy. Carotid stenosis measurements (when applicable) are obtained utilizing NASCET criteria, using the distal internal carotid diameter as the denominator. Multiphase CT imaging of the brain was performed following IV bolus contrast injection. Subsequent parametric perfusion maps were calculated using RAPID software. CONTRAST:  111mL OMNIPAQUE IOHEXOL 350 MG/ML SOLN COMPARISON:  CT head 09/29/2020 FINDINGS: CTA NECK FINDINGS Aortic  arch: Standard branching. Imaged portion shows no evidence of aneurysm or dissection. No significant stenosis of the major arch vessel origins. Right carotid system: Normal right carotid. Negative for atherosclerotic disease. Left carotid system: Negative left carotid. Negative for atherosclerotic disease or stenosis. Vertebral arteries: Normal vertebral arteries bilaterally. Skeleton: No acute skeletal abnormality. Other neck: Negative for mass or enlarged lymph node. Upper chest: Mild ground-glass density in the right upper lobe without focal infiltrate or mass. Review of the MIP images confirms the above findings CTA HEAD FINDINGS Anterior circulation: Normal anterior circulation. Negative for stenosis or large vessel occlusion. Posterior circulation: Normal posterior circulation. Negative for stenosis or large vessel occlusion. Venous sinuses: Normal venous enhancement. Anatomic variants: None Review of the MIP images confirms the above findings CT Brain Perfusion Findings: ASPECTS: 10 CBF (<30%) Volume: 28mL Perfusion (Tmax>6.0s) volume: 19mL Mismatch Volume: 57mL Infarction Location:Patchy decreased perfusion in the left frontal lobe and high right frontal lobe. IMPRESSION: Normal CT angiogram head neck. No significant atherosclerotic disease. No intracranial large vessel occlusion CT perfusion demonstrates small patchy areas of diminished perfusion left frontal and right frontal lobe of uncertain significance. No fixed infarct. Electronically Signed   By: Franchot Gallo M.D.   On: 09/29/2020 18:55   MR Brain W and Wo Contrast  Result Date: 09/30/2020 CLINICAL DATA:  Right-sided facial droop with aphasia. Left-sided body weakness EXAM: MRI HEAD WITHOUT AND WITH CONTRAST TECHNIQUE: Multiplanar, multiecho pulse sequences of the brain and surrounding structures were obtained without and with intravenous contrast. CONTRAST:  78mL GADAVIST GADOBUTROL 1 MMOL/ML IV SOLN COMPARISON:  CT and CTA from yesterday  FINDINGS: Brain: 2 ring-enhancing comma shaped brain lesions located along the gray-white matter junction of the posterior left temporal lobe and lateral left frontal lobe, measuring up to 21 mm in maximal span at the posterior temporal lobe. No associated restricted diffusion to imply pyogenic abscess. No discontinuous enhancement to imply tumefactive demyelination. Both lesions have a very similar morphology with a fairly smoothly contoured rim and adjacent nodular thickening with infolding of the wall. There is extensive adjacent vasogenic edema with local mass effect and mild rightward midline shift. No acute infarct, hydrocephalus, or acute hemorrhage. Small presumed arachnoid cyst is seen at the right vertex with mild local mass effect. Vascular: Preserved flow voids and vascular enhancement Skull and upper cervical spine: Normal marrow signal. Sinuses/Orbits: Opacified right frontal sinus with proteinaceous features. 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 Electronically Signed   By: Monte Fantasia M.D.   On: 09/30/2020 05:32   MR PELVIS W WO CONTRAST  Result Date: 10/02/2020 CLINICAL DATA:  Cancer of unknown primary. EXAM: MRI ABDOMEN AND PELVIS WITHOUT AND WITH CONTRAST TECHNIQUE: Multiplanar multisequence MR imaging of the abdomen and  pelvis was performed both before and after the administration of intravenous contrast. CONTRAST:  11mL GADAVIST GADOBUTROL 1 MMOL/ML IV SOLN COMPARISON:  CT chest, abdomen and pelvis of September 29, 2020 FINDINGS: COMBINED FINDINGS FOR BOTH MR ABDOMEN AND PELVIS Lower chest: Please see dedicated CT of the chest from April 18th. No effusion or consolidation. Limited assessment of the lung bases on MRI.  Heart size is enlarged. Hepatobiliary: Marked decreased T2 signal within the hepatic parenchyma in this patient who received a dose of Feraheme on 09/30/2020 which limits the evaluation in general. No visible lesion, biliary ductal dilation or pericholecystic stranding. Pancreas: Pancreas grossly normal without ductal dilation or sign of adjacent inflammation. Spleen: Spleen with marked reduction in signal on T2 weighted images, normal size. Adrenals/Urinary Tract: Adrenal glands are unremarkable. Mild fullness of collecting systems and ureters is similar to the previous CT evaluation with mild RIGHT hydronephrosis perhaps related to compression of the distal ureters secondary to mass in the pelvis. Smooth renal contours. No visible renal lesion. Stomach/Bowel: Limited assessment of the gastrointestinal tract on MRI confounded by presence of susceptibility artifact from gas and gross patient and respiratory motion. Vascular/Lymphatic: Vascular structures in the abdomen are patent. The IVC is engorged in the setting of RIGHT heart enlargement. No aneurysmal dilation of the abdominal aorta. No adenopathy visible in the abdomen or in the pelvis. Limited assessment due to motion artifact and pre-existing vascular signal related to [facility] administration. No pelvic adenopathy. Reproductive: Bulky enlargement of the uterus. Numerous uterine masses. Endometrium not assessed. Adjacent urinary bladder is collapsed due to mass effect. Distal RIGHT ureter is mildly dilated as is the distal LEFT ureter due to mass effect. Uterus measuring 16 x 9 x 11 cm. Heterogeneous mass in the lower uterine segment is the largest mass in the uterus measuring 10 x 10 x 9.6 cm. Other small masses with predominantly low T2 signal and variable enhancement are noted. The dominant uterine mass displays enhancement from baseline signal with heterogeneous pattern. Examination limited by motion. Other: RIGHT paramidline pelvic floor/Peri coccygeal  and perirectal cystic area measuring 5.3 x 3.3 cm with mildly thickened wall. Baseline T1 signal, hypointense. Baseline T2 signal with thin rim of low signal and evidence of septation. It is incompletely imaged at its lower margin on post-contrast images in appears to be below the pubococcygeal muscle abutting the gluteal muscle and the sacrum and coccyx as well as the rectum, perhaps insinuated between pelvic floor musculature. No ascites. Musculoskeletal: Marked diminished T1 and T2 signal within visualized skeletal structures compatible with iron deposition in marrow spaces in this patient with recent exogenous iron administration. No visible lesion. IMPRESSION: 1. Bulky enlargement of the uterus with multiple uterine masses, the largest of which measures 10 x 10 x 9.6 cm. Given the size of the dominant lesion and the heterogeneity leiomyosarcoma is a differential consideration though no definitive findings of leiomyosarcoma are present. 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 2. RIGHT paramidline pelvic floor/Peri coccygeal and perirectal cystic area measuring 5.3 x 3.3 cm with mildly thickened wall. It is incompletely imaged at its lower margin on post-contrast images and appears to be below the pubococcygeal muscle and perhaps insinuated between pelvic floor musculature. This may represent a tail gut cyst. The possibility of complication by infection is considered given the mildly thickened wall with enhancement. No nodularity. 3. Mild fullness of the collecting systems and ureters is similar to the previous CT evaluation with mild RIGHT hydronephrosis perhaps related to compression of the  distal ureters secondary to mass effect. 4. 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 This does limit assessment. 5. Cardiomegaly. Electronically Signed   By: Donzetta Kohut M.D.   On: 10/02/2020 08:26   MR  ABDOMEN W WO CONTRAST  Result Date: 10/02/2020 CLINICAL DATA:  Cancer of unknown primary. EXAM: MRI ABDOMEN AND PELVIS WITHOUT AND WITH CONTRAST TECHNIQUE: Multiplanar multisequence MR imaging of the abdomen and pelvis was performed both before and after the administration of intravenous contrast. CONTRAST:  32mL GADAVIST GADOBUTROL 1 MMOL/ML IV SOLN COMPARISON:  CT chest, abdomen and pelvis of September 29, 2020 FINDINGS: COMBINED FINDINGS FOR BOTH MR ABDOMEN AND PELVIS Lower chest: Please see dedicated CT of the chest from April 18th. No effusion or consolidation. Limited assessment of the lung bases on MRI. Heart size is enlarged. Hepatobiliary: Marked decreased T2 signal within the hepatic parenchyma in this patient who received a dose of Feraheme on 09/30/2020 which limits the evaluation in general. No visible lesion, biliary ductal dilation or pericholecystic stranding. Pancreas: Pancreas grossly normal without ductal dilation or sign of adjacent inflammation. Spleen: Spleen with marked reduction in signal on T2 weighted images, normal size. Adrenals/Urinary Tract: Adrenal glands are unremarkable. Mild fullness of collecting systems and ureters is similar to the previous CT evaluation with mild RIGHT hydronephrosis perhaps related to compression of the distal ureters secondary to mass in the pelvis. Smooth renal contours. No visible renal lesion. Stomach/Bowel: Limited assessment of the gastrointestinal tract on MRI confounded by presence of susceptibility artifact from gas and gross patient and respiratory motion. Vascular/Lymphatic: Vascular structures in the abdomen are patent. The IVC is engorged in the setting of RIGHT heart enlargement. No aneurysmal dilation of the abdominal aorta. No adenopathy visible in the abdomen or in the pelvis. Limited assessment due to motion artifact and pre-existing vascular signal related to [facility] administration. No pelvic adenopathy. Reproductive: Bulky enlargement of the  uterus. Numerous uterine masses. Endometrium not assessed. Adjacent urinary bladder is collapsed due to mass effect. Distal RIGHT ureter is mildly dilated as is the distal LEFT ureter due to mass effect. Uterus measuring 16 x 9 x 11 cm. Heterogeneous mass in the lower uterine segment is the largest mass in the uterus measuring 10 x 10 x 9.6 cm. Other small masses with predominantly low T2 signal and variable enhancement are noted. The dominant uterine mass displays enhancement from baseline signal with heterogeneous pattern. Examination limited by motion. Other: RIGHT paramidline pelvic floor/Peri coccygeal and perirectal cystic area measuring 5.3 x 3.3 cm with mildly thickened wall. Baseline T1 signal, hypointense. Baseline T2 signal with thin rim of low signal and evidence of septation. It is incompletely imaged at its lower margin on post-contrast images in appears to be below the pubococcygeal muscle abutting the gluteal muscle and the sacrum and coccyx as well as the rectum, perhaps insinuated between pelvic floor musculature. No ascites. Musculoskeletal: Marked diminished T1 and T2 signal within visualized skeletal structures compatible with iron deposition in marrow spaces in this patient with recent exogenous iron administration. No visible lesion. IMPRESSION: 1. Bulky enlargement of the uterus with multiple uterine masses, the largest of which measures 10 x 10 x 9.6 cm. Given the size of the dominant lesion and the heterogeneity leiomyosarcoma is a differential consideration though no definitive findings of leiomyosarcoma are present. 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 2. RIGHT paramidline pelvic floor/Peri coccygeal and perirectal cystic area measuring 5.3 x 3.3  cm with mildly thickened wall. It is incompletely imaged at its lower margin on post-contrast images and appears to be below the pubococcygeal muscle and perhaps insinuated between pelvic floor musculature. This may  represent a tail gut cyst. The possibility of complication by infection is considered given the mildly thickened wall with enhancement. No nodularity. 3. Mild fullness of the collecting systems and ureters is similar to the previous CT evaluation with mild RIGHT hydronephrosis perhaps related to compression of the distal ureters secondary to mass effect. 4. 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 This does limit assessment. 5. Cardiomegaly. Electronically Signed   By: Zetta Bills M.D.   On: 10/02/2020 08:26   CT CHEST ABDOMEN PELVIS W CONTRAST  Result Date: 09/29/2020 CLINICAL DATA:  Intracranial mass, rule out primary lesion EXAM: CT CHEST, ABDOMEN, AND PELVIS WITH CONTRAST TECHNIQUE: Multidetector CT imaging of the chest, abdomen and pelvis was performed following the standard protocol during bolus administration of intravenous contrast. CONTRAST:  49mL OMNIPAQUE IOHEXOL 300 MG/ML  SOLN COMPARISON:  None. FINDINGS: CT CHEST FINDINGS Cardiovascular: Thoracic aorta and its branches are within normal limits. No aneurysmal dilatation is seen. The pulmonary artery as visualized is within normal limits. Heart is not significantly enlarged. Mediastinum/Nodes: Thoracic inlet is within normal limits. No hilar or mediastinal adenopathy is noted. The esophagus as visualized is within normal limits. Lungs/Pleura: The lungs are well aerated bilaterally. Mild dependent atelectatic changes are seen. No focal parenchymal nodule is noted. No sizable effusion or pneumothorax is noted. Musculoskeletal: Degenerative changes of the thoracic spine are noted. No rib abnormality is seen. No specific breast mass is noted. CT ABDOMEN PELVIS FINDINGS Hepatobiliary: No focal liver abnormality is seen. No gallstones, gallbladder wall thickening, or biliary dilatation. Pancreas: Unremarkable. No pancreatic ductal dilatation or surrounding  inflammatory changes. Spleen: Normal in size without focal abnormality. Adrenals/Urinary Tract: Adrenal glands are within normal limits. Kidneys demonstrate a normal enhancement pattern with normal excretion bilaterally. No obstructive changes are seen. Bladder is well distended with opacified urine consistent with prior contrast administration. Stomach/Bowel: No obstructive or inflammatory changes of the colon are noted. The appendix is well visualized and within normal limits. Small bowel and stomach appear within normal limits. Vascular/Lymphatic: No significant vascular findings are present. No enlarged abdominal or pelvic lymph nodes. Reproductive: Uterus is well visualized and demonstrates bulky enlargement with multifocal lesions. The largest of these measures at least 10 cm in dimension. These likely represent uterine fibroids although the possibility of a more aggressive lesion could not be totally excluded. Other: Adjacent to the rectum on the right there is a somewhat multiloculated cystic structure which measures approximately 4.6 by 3.0 cm. This is of uncertain etiology. Possibility of small perirectal abscess would deserve consideration. Alternatively this could represent a cystic metastatic lesion. Musculoskeletal: No acute bony abnormality is noted. IMPRESSION: CT of the chest: No acute intrathoracic abnormality is noted. CT of the abdomen and pelvis: Significantly enlarged uterus with multiple uterine lesions within. These may simply represent uterine fibroids although the possibility of a more aggressive process could not be excluded given the changes in the head on recent CT. MRI would be helpful on a nonemergent basis when the patient's condition has improved for further evaluation. Cystic structure adjacent to the rectum on the right of uncertain significance and etiology. This could also be evaluated with MRI of the pelvis. No other focal abnormality is noted. Electronically Signed   By:  Inez Catalina M.D.   On: 09/29/2020 20:45   CT CEREBRAL PERFUSION W CONTRAST  Result Date: 09/29/2020 CLINICAL DATA:  Acute neuro deficit, aphasia.  Right-sided deficit EXAM: CT ANGIOGRAPHY HEAD AND NECK CT PERFUSION BRAIN TECHNIQUE: Multidetector CT imaging of the head and neck was performed using the standard protocol during bolus administration of intravenous contrast. Multiplanar CT image reconstructions and MIPs were obtained to evaluate the vascular anatomy. Carotid stenosis measurements (when applicable) are obtained utilizing NASCET criteria, using the distal internal carotid diameter as the denominator. Multiphase CT imaging of the brain was performed following IV bolus contrast injection. Subsequent parametric perfusion maps were calculated using RAPID software. CONTRAST:  176mL OMNIPAQUE IOHEXOL 350 MG/ML SOLN COMPARISON:  CT head 09/29/2020 FINDINGS: CTA NECK FINDINGS Aortic arch: Standard branching. Imaged portion shows no evidence of aneurysm or dissection. No significant stenosis of the major arch vessel origins. Right carotid system: Normal right carotid. Negative for atherosclerotic disease. Left carotid system: Negative left carotid. Negative for atherosclerotic disease or stenosis. Vertebral arteries: Normal vertebral arteries bilaterally. Skeleton: No acute skeletal abnormality. Other neck: Negative for mass or enlarged lymph node. Upper chest: Mild ground-glass density in the right upper lobe without focal infiltrate or mass. Review of the MIP images confirms the above findings CTA HEAD FINDINGS Anterior circulation: Normal anterior circulation. Negative for stenosis or large vessel occlusion. Posterior circulation: Normal posterior circulation. Negative for stenosis or large vessel occlusion. Venous sinuses: Normal venous enhancement. Anatomic variants: None Review of the MIP images confirms the above findings CT Brain Perfusion Findings: ASPECTS: 10 CBF (<30%) Volume: 39mL Perfusion (Tmax>6.0s)  volume: 8mL Mismatch Volume: 7mL Infarction Location:Patchy decreased perfusion in the left frontal lobe and high right frontal lobe. IMPRESSION: Normal CT angiogram head neck. No significant atherosclerotic disease. No intracranial large vessel occlusion CT perfusion demonstrates small patchy areas of diminished perfusion left frontal and right frontal lobe of uncertain significance. No fixed infarct. Electronically Signed   By: Franchot Gallo M.D.   On: 09/29/2020 18:55   EEG adult  Result Date: 09/30/2020 Lora Havens, MD     09/30/2020  2:07 PM Patient Name: Candace Jones MRN: [id number] Epilepsy Attending: Lora Havens Referring Physician/Provider: Dr. Gean Birchwood Date: 09/30/2020 Duration: 23.02 mins Patient history: 40 year old female presented with sudden onset of aphasia right facial droop and right-sided weakness.  CT head showed possible left-sided brain mass with edema.  EEG to evaluate for seizures. Level of alertness: Awake AEDs during EEG study: Keppra Technical aspects: This EEG study was done with scalp electrodes positioned according to the 10-20 International system of electrode placement. Electrical activity was acquired at a sampling rate of 500Hz  and reviewed with a high frequency filter of 70Hz  and a low frequency filter of 1Hz . EEG data were recorded continuously and digitally stored. Description:  The posterior dominant rhythm consists of 9 Hz activity of moderate voltage (25-35 uV) seen predominantly in posterior head regions, symmetric and reactive to eye opening and eye closing. EEG also showed continuous rhythmic amplitude 2 to 3 Hz delta slowing in left temporal region. Additionally there is also continuous generalized polymorphic mixed frequencies with predominantly 5 to 8 Hz theta-alpha activity. Hyperventilation and photic stimulation were not performed.   ABNORMALITY - Lateralized rhythmic delta activity, left temporal region - Continuous slow, generalized  IMPRESSION: This study is suggestive of cortical dysfunction in left temporal region likely secondary underlying structural abnormality, ictal, postictal state.  Additionally there is evidence of moderate diffuse encephalopathy, nonspecific  etiology.  No seizures or epileptiform discharges were seen throughout the recording. Priyanka Barbra Sarks    Overnight EEG with video  Result Date: 10/01/2020 Lora Havens, MD     10/02/2020  8:47 AM Patient Name: Candace Jones MRN: [id number] Epilepsy Attending: Lora Havens Referring Physician/Provider:  Myra Rude, NP Duration: 09/30/2020 1406 to 10/01/2020 1448  Patient history: 40 year old female presented with sudden onset of aphasia right facial droop and right-sided weakness.  CT head showed possible left-sided brain mass with edema.  EEG to evaluate for seizures.  Level of alertness: Awake, asleep  AEDs during EEG study: Keppra  Technical aspects: This EEG study was done with scalp electrodes positioned according to the 10-20 International system of electrode placement. Electrical activity was acquired at a sampling rate of 500Hz  and reviewed with a high frequency filter of 70Hz  and a low frequency filter of 1Hz . EEG data were recorded continuously and digitally stored.  Description:  The posterior dominant rhythm consists of 9 Hz activity of moderate voltage (25-35 uV) seen predominantly in posterior head regions, symmetric and reactive to eye opening and eye closing.   Sleep was characterized by vertex waves, sleep spindles (12 to 14 Hz), maximal frontocentral region.  EEG also showed near continuous rhythmic amplitude 2 to 3 Hz delta slowing in left temporal region. Additionally there is also continuous generalized polymorphic mixed frequencies with predominantly 5 to 8 Hz theta-alpha activity. Hyperventilation and photic stimulation were not performed.    ABNORMALITY - Lateralized rhythmic delta activity, left temporal region - Continuous slow,  generalized  IMPRESSION: This study is suggestive of cortical dysfunction in left temporal region likely secondary underlying structural abnormality, ictal, postictal state.  Additionally there is evidence of moderate diffuse encephalopathy, nonspecific etiology.  No seizures or epileptiform discharges were seen throughout the recording.  Lora Havens   ECHOCARDIOGRAM COMPLETE  Result Date: 10/03/2020    ECHOCARDIOGRAM REPORT   Patient Name:   Candace Jones Date of Exam: 10/03/2020 Medical Rec #:  [id number]        Height:       68.1 in Accession #:    [id number]       Weight:       198.0 lb Date of Birth:  12/26/1980       BSA:          2.037 m Patient Age:    39 years         BP:           139/109 mmHg Patient Gender: F                HR:           44 bpm. Exam Location:  Inpatient Procedure: 2D Echo Indications:    abnormal ecg  History:        Patient has no prior history of Echocardiogram examinations.                 Brain tumor.  Sonographer:    Johny Chess Referring Phys: [id number] AMRIT ADHIKARI IMPRESSIONS  1. Left ventricular ejection fraction, by estimation, is 55 to 60%. The left ventricle has normal function. The left ventricle has no regional wall motion abnormalities. Left ventricular diastolic parameters were normal.  2. Right ventricular systolic function is normal. The right ventricular size is normal.  3. The mitral valve is normal in structure. Trivial mitral valve regurgitation. No evidence of mitral stenosis.  4. The aortic valve is normal  in structure. Aortic valve regurgitation is not visualized. No aortic stenosis is present.  5. The inferior vena cava is normal in size with greater than 50% respiratory variability, suggesting right atrial pressure of 3 mmHg. FINDINGS  Left Ventricle: Left ventricular ejection fraction, by estimation, is 55 to 60%. The left ventricle has normal function. The left ventricle has no regional wall motion abnormalities. The left ventricular  internal cavity size was normal in size. There is  no left ventricular hypertrophy. Left ventricular diastolic parameters were normal. Right Ventricle: The right ventricular size is normal. No increase in right ventricular wall thickness. Right ventricular systolic function is normal. Left Atrium: Left atrial size was normal in size. Right Atrium: Right atrial size was normal in size. Pericardium: There is no evidence of pericardial effusion. Mitral Valve: The mitral valve is normal in structure. Trivial mitral valve regurgitation. No evidence of mitral valve stenosis. Tricuspid Valve: The tricuspid valve is normal in structure. Tricuspid valve regurgitation is not demonstrated. No evidence of tricuspid stenosis. Aortic Valve: The aortic valve is normal in structure. Aortic valve regurgitation is not visualized. No aortic stenosis is present. Pulmonic Valve: The pulmonic valve was normal in structure. Pulmonic valve regurgitation is trivial. No evidence of pulmonic stenosis. Aorta: The aortic root is normal in size and structure. Venous: The inferior vena cava is normal in size with greater than 50% respiratory variability, suggesting right atrial pressure of 3 mmHg. IAS/Shunts: No atrial level shunt detected by color flow Doppler.  LEFT VENTRICLE PLAX 2D LVIDd:         4.90 cm  Diastology LVIDs:         3.10 cm  LV e' medial:    10.00 cm/s LV PW:         0.90 cm  LV E/e' medial:  11.2 LV IVS:        0.80 cm  LV e' lateral:   18.50 cm/s LVOT diam:     2.10 cm  LV E/e' lateral: 6.1 LV SV:         104 LV SV Index:   51 LVOT Area:     3.46 cm  RIGHT VENTRICLE             IVC RV S prime:     14.70 cm/s  IVC diam: 1.60 cm TAPSE (M-mode): 3.2 cm LEFT ATRIUM             Index       RIGHT ATRIUM           Index LA diam:        3.90 cm 1.91 cm/m  RA Area:     13.60 cm LA Vol (A2C):   58.6 ml 28.76 ml/m RA Volume:   28.90 ml  14.18 ml/m LA Vol (A4C):   46.5 ml 22.82 ml/m LA Biplane Vol: 56.8 ml 27.88 ml/m  AORTIC VALVE  LVOT Vmax:   119.00 cm/s LVOT Vmean:  73.100 cm/s LVOT VTI:    0.299 m  AORTA Ao Root diam: 2.60 cm Ao Asc diam:  3.10 cm MITRAL VALVE MV Area (PHT): 3.99 cm     SHUNTS MV Decel Time: 190 msec     Systemic VTI:  0.30 m MV E velocity: 112.00 cm/s  Systemic Diam: 2.10 cm MV A velocity: 57.50 cm/s MV E/A ratio:  1.95 Glori Bickers MD Electronically signed by Glori Bickers MD Signature Date/Time: 10/03/2020/4:35:51 PM    Final    CT HEAD CODE STROKE WO CONTRAST  Addendum Date: 09/29/2020   ADDENDUM REPORT: 09/29/2020 18:36 ADDENDUM: These results were called by telephone at the time of interpretation on 09/29/2020 at 6:36 pm to provider [facility] , who verbally acknowledged these results. Code stroke imaging results were communicated on 09/29/2020 at 6:36 pm to provider Bhagat via text page Electronically Signed   By: Franchot Gallo M.D.   On: 09/29/2020 18:36   Result Date: 09/29/2020 CLINICAL DATA:  Code stroke. Acute neuro deficit. Right-sided deficit, aphasia EXAM: CT HEAD WITHOUT CONTRAST TECHNIQUE: Contiguous axial images were obtained from the base of the skull through the vertex without intravenous contrast. COMPARISON:  None. FINDINGS: Brain: Moderate amount of white matter edema is present in the left temporal lobe and left posterior frontal lobe with sparing of the cortex. There is mass-effect and 6 mm midline shift to the right. No edema on the right side. No hemorrhage. Ventricle size normal. Vascular: Negative for hyperdense vessel Skull: Negative Sinuses/Orbits: Mild mucosal edema paranasal sinuses. Negative orbit Other: None ASPECTS ([place] Stroke Program Early CT Score) - Ganglionic level infarction (caudate, lentiform nuclei, internal capsule, insula, M1-M3 cortex): 7 - Supraganglionic infarction (M4-M6 cortex): 3 Total score (0-10 with 10 being normal): 10 IMPRESSION: 1. Moderate amount of white matter edema in the left frontal and temporal lobe with sparing of cortex. Pattern is most  likely due to tumor. Possible metastatic disease or primary brain tumor. Recommend MRI brain without and with contrast 2. ASPECTS is 10 Electronically Signed: By: Franchot Gallo M.D. On: 09/29/2020 18:33    Labs:  CBC: Recent Labs    10/02/20 0456 10/02/20 1117 10/03/20 0420 10/05/20 0359 10/06/20 0754  WBC 4.4  --  7.2 4.6 4.4  HGB 7.1* 9.2* 8.1* 7.7* 8.8*  HCT 25.5* 27.0* 28.0* 26.9* 30.5*  PLT 196  --  187 172 193    COAGS: Recent Labs    09/29/20 1814  INR 1.1  APTT 23*    BMP: Recent Labs    10/02/20 0456 10/02/20 1117 10/03/20 0420 10/05/20 0359 10/06/20 0754  NA 136 140 138 135 134*  K 4.4 3.9 4.0 3.1* 3.7  CL 109  --  109 105 105  CO2 21*  --  21* 24 24  GLUCOSE 115*  --  111* 83 105*  BUN 12  --  15 11 8   CALCIUM 8.8*  --  8.4* 8.4* 9.0  CREATININE 0.49  --  0.59 0.79 0.73  GFRNONAA >60  --  >60 >60 >60    LIVER FUNCTION TESTS: Recent Labs    09/29/20 1814 09/30/20 0555  BILITOT 0.6 0.7  AST 41 33  ALT 27 24  ALKPHOS 66 60  PROT 9.7* 9.0*  ALBUMIN 3.6 3.2*    TUMOR MARKERS: No results for input(s): AFPTM, CEA, CA199, CHROMGRNA in the last 8760 hours.  Assessment and Plan: Perirectal cyst/fluid collection Imaging reviewed. Plan for image guided aspiration, possible drain placement if fluid appears grossly infected. Labs reviewed. Risks and benefits discussed with the patient including bleeding, infection, damage to adjacent structures, bowel perforation/fistula connection, and sepsis.  All of the patient's questions were answered, patient is agreeable to proceed. Consent signed and in chart.    Thank you for this interesting consult.  I greatly enjoyed meeting Selenia Mihok and look forward to participating in their care.  A copy of this report was sent to the requesting provider on this date.  Electronically Signed: Ascencion Dike, PA-C 10/07/2020, 11:02 AM   I spent a  total of 20 minutes in face to face in clinical  consultation, greater than 50% of which was counseling/coordinating care for perirectal cyst aspiration/drain

## 2020-10-07 NOTE — Progress Notes (Signed)
Speech-language-Cognitive Assessment   10/07/20 0933  SLP Visit Information  SLP Received On 10/07/20  SLP Time Calculation  SLP Start Time (ACUTE ONLY) 0933  SLP Stop Time (ACUTE ONLY) 1003  SLP Time Calculation (min) (ACUTE ONLY) 30 min  General Information  HPI 40 yr old dmitted with aphasia and right-sided weakness. 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)  Pain Assessment  Pain Assessment No/denies pain  Oral Motor/Sensory Function  Overall Oral Motor/Sensory Function WFL  Cognition  Overall Cognitive Status Impaired/Different from baseline  Arousal/Alertness Awake/alert  Orientation Level Oriented to person;Oriented to place  Attention Sustained  Sustained Attention Appears intact  Memory  (will assess further)  Awareness Impaired  Awareness Impairment Anticipatory impairment  Problem Solving Impaired  Problem Solving Impairment Functional complex (suspect)  Safety/Judgment Impaired (for finance, med management)  Auditory Comprehension  Overall Auditory Comprehension Appears within functional limits for tasks assessed (suspect complex information may be challenging.)  Commands [facility]  Visual Recognition/Discrimination  Discrimination Not tested  Reading Comprehension  Reading Status Not tested  Expression  Primary Mode of Expression Verbal  Verbal Expression  Overall Verbal Expression Impaired  Initiation No impairment  Level of Generative/Spontaneous Verbalization Conversation   Repetition No impairment  Naming Impairment (some degree of language barrier)  Confrontation  (90%)  Verbal Errors  (? dysnomia)  Pragmatics No impairment  Written Expression  Dominant Hand Right  Written Expression X  Self Formulation Ability Sentence  Motor Speech  Overall Motor Speech Appears within functional limits for tasks assessed  Respiration WFL  Phonation Normal  Resonance [facility]  Articulation WFL  Intelligibility Intelligible  Motor Planning [facility]  SLP - End of Session  Patient left in bed;with family/visitor present;with call bell/phone within reach  Assessment  Clinical Impression Statement (ACUTE ONLY) Speech-language-cognitive evaluation completed with sister's present and assisting in interpreting. Pt and sisters report speech was initially dysarthric, now intelligible but cognition not quite at baseline. Speech was fluent with occasional hesitation and 90% on confrontational naming task. Exhibited mild difficulty comprehending question related to temporal orientation in [place] and needed repeated cues for month in [place]. Followed basic commands without difficulty. However, when information becomes lengthy or more complex she requires assistance. Pt is currently works at [employer] and teenage children reside with her. Recomend continued ST for higher level cognitive-communicative abilities.  SLP Recommendation/Assessment Patient needs continued Speech Lanaguage Pathology Services  SLP Visit Diagnosis Cognitive communication deficit (R41.841)  Problem List Problem Solving;Verbal expression (high level expression, awareness)  Plan  Speech Therapy Frequency (ACUTE ONLY) min 2x/week  Duration 2 weeks  Treatment/Interventions SLP instruction and feedback;Compensatory strategies;Patient/family education;Cognitive reorganization  Potential to Achieve Goals (ACUTE ONLY) Good  SLP Recommendations  Follow up Recommendations Home health SLP  SLP Equipment None recommended by  SLP  Individuals Consulted  Consulted and Agree with Results and Recommendations Patient;Family member/caregiver  Family Member Consulted  sisters  SLP Goals  Progress/Goals/Alternative treatment plan discussed with pt/caregiver and they Agree  SLP Evaluations  $ SLP Speech Visit 1 Visit  SLP Evaluations  $ SLP EVAL LANGUAGE/SOUND PRODUCTION  1 Procedure  Orbie Pyo Marshawn Normoyle M.Ed Risk analyst [phone] Office [phone]

## 2020-10-07 NOTE — TOC Benefit Eligibility Note (Addendum)
Patient Advocate Encounter  Patient is approved through the [place] Direct Patient Assistance Program for [place] through 10/07/2021.   Medication will be delivered to [facility] for Infectious Disease) on 10/08/2020   Lyndel Safe, [place] Patient Advocate Specialist [place] Team Direct Number: [phone]  Fax: [phone]

## 2020-10-07 NOTE — Progress Notes (Signed)
Nutrition Follow-up  DOCUMENTATION CODES:   Obesity unspecified  INTERVENTION:  Continue Ensure Enlive po BID, each supplement provides 350 kcal and 20 grams of protein.  Encourage adequate PO intake.   NUTRITION DIAGNOSIS:   Increased nutrient needs related to acute illness as evidenced by estimated needs; ongoing  GOAL:   Patient will meet greater than or equal to 90% of their needs; progressing  MONITOR:   PO intake,Supplement acceptance,Skin,Weight trends,Labs,I & O's,Diet advancement  REASON FOR ASSESSMENT:   Malnutrition Screening Tool    ASSESSMENT:   40 year old female from [place] with no significant past medical history presents with complaints of aphasia, right facial droop, right-sided weakness. CT head showed possible left-sided brain mass with edema. 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   Pt underwent open brain biopsy by neurosurgery on 10/02/2020.  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 CT abd/pelvis showed  significantly enlarged uterus with multiple uterine lesions within,cystic structure adjacent to the rectum on the right.  Pt underwent aspiration/drainage procedure of perirectal abscess today. Diet has just been advanced. Pt currently on a regular diet with thin liquids. Previous meal completion has been 50-75%. Pt currently has Ensure ordered and has been consuming them. RD to continue with current orders to aid in caloric and protein needs.   Labs and medications reviewed.   Diet Order:   Diet Order            Diet regular Room service appropriate? Yes; Fluid consistency: Thin  Diet effective now                 EDUCATION NEEDS:   Not appropriate for education at this time  Skin:  Skin Assessment: Skin Integrity Issues: Skin Integrity Issues:: Incisions Incisions: head  Last BM:  4/25  Height:   Ht Readings from Last 1  Encounters:  10/04/20 5\' 8"  (1.727 m)    Weight:   Wt Readings from Last 1 Encounters:  09/29/20 89.8 kg   BMI:  Body mass index is 30.1 kg/m.  Estimated Nutritional Needs:   Kcal:  2000-2200  Protein:  100-115 grams  Fluid:  >/= 2 L/day  Corrin Parker, MS, RD, LDN RD pager number/after hours weekend pager number on Amion.

## 2020-10-07 NOTE — Telephone Encounter (Signed)
Received call from [facility] at [facility] wanting to clarify prescription for pyrimethamine. She states typical dosing is a one time loading dose of 4 tablets a day. She wants to confirm that the ordered maintenance dose is only 25mg  (1 tablet) daily, as the patient is over 60kg, typical maintenance dose would be 50mg  (2 tablets daily). Will route to provider.   Callback number: [phone]  Beryle Flock, RN

## 2020-10-07 NOTE — Procedures (Signed)
Interventional Radiology Procedure Note  Procedure: 10 fr drain placed in peri-rectal abscess  Indication: Peri-rectal abscess  Findings: Please refer to procedural dictation for full description.  Complications: None  EBL: < 10 mL  Miachel Roux, MD [phone]

## 2020-10-07 NOTE — Telephone Encounter (Signed)
Taken care of with inpatient pharmacy assistance.  Nothing further needed thank you!

## 2020-10-08 ENCOUNTER — Other Ambulatory Visit (HOSPITAL_COMMUNITY): Payer: Self-pay

## 2020-10-08 DIAGNOSIS — B589 Toxoplasmosis, unspecified: Principal | ICD-10-CM

## 2020-10-08 LAB — BASIC METABOLIC PANEL
Anion gap: 7 (ref 5–15)
BUN: 14 mg/dL (ref 6–20)
CO2: 23 mmol/L (ref 22–32)
Calcium: 8.9 mg/dL (ref 8.9–10.3)
Chloride: 103 mmol/L (ref 98–111)
Creatinine, Ser: 0.71 mg/dL (ref 0.44–1.00)
GFR, Estimated: >60 mL/min (ref >60)
Glucose, Bld: 100 mg/dL — ABNORMAL HIGH (ref 70–99)
Potassium: 3.8 mmol/L (ref 3.5–5.1)
Sodium: 133 mmol/L — ABNORMAL LOW (ref 135–145)

## 2020-10-08 LAB — CBC WITH DIFFERENTIAL/PLATELET
Abs Immature Granulocytes: 0.1 10*3/uL — ABNORMAL HIGH (ref 0.00–0.07)
Basophils Absolute: 0 10*3/uL (ref 0.0–0.1)
Basophils Relative: 0 %
Eosinophils Absolute: 0 10*3/uL (ref 0.0–0.5)
Eosinophils Relative: 1 %
HCT: 31 % — ABNORMAL LOW (ref 36.0–46.0)
Hemoglobin: 9 g/dL — ABNORMAL LOW (ref 12.0–15.0)
Immature Granulocytes: 2 %
Lymphocytes Relative: 19 %
Lymphs Abs: 1 10*3/uL (ref 0.7–4.0)
MCH: 20.2 pg — ABNORMAL LOW (ref 26.0–34.0)
MCHC: 29 g/dL — ABNORMAL LOW (ref 30.0–36.0)
MCV: 69.5 fL — ABNORMAL LOW (ref 80.0–100.0)
Monocytes Absolute: 0.3 10*3/uL (ref 0.1–1.0)
Monocytes Relative: 6 %
Neutro Abs: 3.9 10*3/uL (ref 1.7–7.7)
Neutrophils Relative %: 72 %
Platelets: 190 10*3/uL (ref 150–400)
RBC: 4.46 MIL/uL (ref 3.87–5.11)
RDW: 31.7 % — ABNORMAL HIGH (ref 11.5–15.5)
WBC: 5.4 10*3/uL (ref 4.0–10.5)
nRBC: 0 % (ref 0.0–0.2)

## 2020-10-08 LAB — HEPATITIS B SURFACE ANTIGEN: Hepatitis B Surface Ag: NONREACTIVE

## 2020-10-08 LAB — HEPATITIS A ANTIBODY, TOTAL: hep A Total Ab: REACTIVE — AB

## 2020-10-08 LAB — HEPATITIS B CORE ANTIBODY, TOTAL: Hep B Core Total Ab: NONREACTIVE

## 2020-10-08 MED ORDER — PYRIMETHAMINE 25 MG PO TABS
75.0000 mg | ORAL_TABLET | Freq: Every day | ORAL | Status: DC
  Administered 2020-10-09 – 2020-10-10 (×2): 75 mg via ORAL
  Filled 2020-10-08 (×4): qty 3

## 2020-10-08 MED ORDER — PYRIMETHAMINE 25 MG PO TABS
75.0000 mg | ORAL_TABLET | Freq: Every day | ORAL | Status: DC
  Filled 2020-10-08: qty 3

## 2020-10-08 MED ORDER — PYRIMETHAMINE 25 MG PO TABS
200.0000 mg | ORAL_TABLET | Freq: Two times a day (BID) | ORAL | Status: AC
  Administered 2020-10-08: 200 mg via ORAL
  Filled 2020-10-08: qty 8

## 2020-10-08 MED ORDER — SULFADIAZINE 500 MG PO TABS
1500.0000 mg | ORAL_TABLET | Freq: Four times a day (QID) | ORAL | Status: DC
  Administered 2020-10-08 – 2020-10-10 (×6): 1500 mg via ORAL
  Filled 2020-10-08 (×9): qty 3

## 2020-10-08 MED ORDER — LEUCOVORIN CALCIUM 25 MG PO TABS
25.0000 mg | ORAL_TABLET | Freq: Every day | ORAL | Status: DC
  Administered 2020-10-08 – 2020-10-10 (×3): 25 mg via ORAL
  Filled 2020-10-08 (×3): qty 1

## 2020-10-08 MED ORDER — AMOXICILLIN-POT CLAVULANATE 875-125 MG PO TABS
1.0000 | ORAL_TABLET | Freq: Two times a day (BID) | ORAL | Status: DC
  Administered 2020-10-08 – 2020-10-10 (×4): 1 via ORAL
  Filled 2020-10-08 (×6): qty 1

## 2020-10-08 MED ORDER — SULFAMETHOXAZOLE-TRIMETHOPRIM 400-80 MG PO TABS
1.0000 | ORAL_TABLET | Freq: Two times a day (BID) | ORAL | Status: DC
  Administered 2020-10-08 – 2020-10-09 (×2): 1 via ORAL
  Filled 2020-10-08 (×3): qty 1

## 2020-10-08 MED ORDER — PYRIMETHAMINE 25 MG PO TABS
200.0000 mg | ORAL_TABLET | Freq: Two times a day (BID) | ORAL | Status: DC
  Filled 2020-10-08: qty 8

## 2020-10-08 MED ORDER — DEXAMETHASONE SODIUM PHOSPHATE 4 MG/ML IJ SOLN
4.0000 mg | Freq: Two times a day (BID) | INTRAMUSCULAR | Status: DC
  Administered 2020-10-08: 4 mg via INTRAVENOUS
  Filled 2020-10-08: qty 1

## 2020-10-08 NOTE — Progress Notes (Signed)
Subjective: The patient is alert and pleasant.  She is talking with her sister on the phone.  She has no complaints.  Objective: Vital signs in last 24 hours: Temp:  [97.6 F (36.4 C)-98.6 F (37 C)] 97.6 F (36.4 C) (04/27 0359) Pulse Rate:  [53-88] 53 (04/27 0359) Resp:  [12-20] 20 (04/27 0359) BP: (103-136)/(67-97) 103/75 (04/27 0359) SpO2:  [96 %-100 %] 96 % (04/27 0359) Estimated body mass index is 30.1 kg/m as calculated from the following:   Height as of this encounter: 5\' 8"  (1.727 m).   Weight as of this encounter: 89.8 kg.   Intake/Output from previous day: 04/26 0701 - 04/27 0700 In: 10  Out: 10 [Drains:10] Intake/Output this shift: No intake/output data recorded.  Physical exam the patient is alert and oriented.  Her speech is normal.  Her strength is normal.  Her wound is healing well.  Lab Results: Recent Labs    10/06/20 0754 10/08/20 0415  WBC 4.4 5.4  HGB 8.8* 9.0*  HCT 30.5* 31.0*  PLT 193 190   BMET Recent Labs    10/06/20 0754 10/08/20 0415  NA 134* 133*  K 3.7 3.8  CL 105 103  CO2 24 23  GLUCOSE 105* 100*  BUN 8 14  CREATININE 0.73 0.71  CALCIUM 9.0 8.9    Studies/Results: CT IMAGE GUIDED DRAINAGE BY PERCUTANEOUS CATHETER  Result Date: 10/07/2020 INDICATION: 40 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 EXAM: CT GUIDED DRAINAGE OF PELVIC ABSCESS MEDICATIONS: The patient is currently admitted to the hospital and receiving intravenous antibiotics. The antibiotics were administered within an appropriate time frame prior to the initiation of the procedure. ANESTHESIA/SEDATION: 3 mg IV Versed 150 mcg IV Fentanyl Moderate Sedation Time:  18 minutes The patient was continuously monitored during the procedure by the interventional radiology nurse under my direct supervision. COMPLICATIONS: None immediate. TECHNIQUE: Informed written consent was obtained from the patient after a  thorough discussion of the procedural risks, benefits and alternatives. All questions were addressed. Maximal Sterile Barrier Technique was utilized including caps, mask, sterile gowns, sterile gloves, sterile drape, hand hygiene and skin antiseptic. A timeout was performed prior to the initiation of the procedure. PROCEDURE: The posterior right pelvic skin was prepped with chlorhexidine in a sterile fashion, and a sterile drape was applied covering the operative field. A sterile gown and sterile gloves were used for the procedure. Local anesthesia was provided with 1% Lidocaine. Following local lidocaine administration, 19 gauge Yueh needle was inserted into the fluid collection. Purulent return was noted. The Yueh catheter was removed over 0.035 inch guidewire and tract dilation was performed with 10 and 12 [place] dilators. 10.2 [place] multipurpose pigtail drain was placed into the collection. Total of 30 mL of purulent material was aspirated. Samples were sent for Gram stain and culture. Drain secured to skin with suture and connected to bulb suction. IMPRESSION: 10.2 [place] multipurpose pigtail drain placed in perirectal abscess. Electronically Signed   By: Miachel Roux M.D.   On: 10/07/2020 15:03    Assessment/Plan: Postop day #6: The patient is doing well.  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  I will sign off.  Please have the patient follow-up with me on a as needed basis.  LOS: 8 days     Ophelia Charter 10/08/2020, 7:49 AM

## 2020-10-08 NOTE — Progress Notes (Addendum)
[facility]  Date of Admission:  09/29/2020      Total days of antibiotics 6  bactrim           ASSESSMENT: Candace Jones is a 40 y.o. 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    CNS Toxoplasmosis = Fortunately her medications are available to start today - will begin pyrimethamine + sulfadiazine + leucovorin for 6 weeks.  All CNS symptoms have resolved, no need for corticosteroids at this point. Surgical incision is well-healing. Doing well from [place] stand point and they signed off.   HIV, AIDS+ = defer ART until 2 weeks of toxo treatment completed. Can get her samples in clinic until ADAP approved. Bactrim SS daily for PJP prophylaxis. She is not currently sexually active (last encounter in [place] prior to June 2021) - counseled to abstain currently. No h/o STIs otherwise. No pap smear done - will see if we can do one for her at upcoming appt for prelim cervical cancer screening/STI screen and repeat in 6-12 months based on results. No h/o contact with TB patients - will check Quantiferon in AM. Hep studies pending.   Uninsured = I will notify our clinic to call her sister as requested to begin [employer] [place] coverage for HIV-related care outpatient. Discussed this is limited care coverage and ultimately would be best to re-instate her previous insurance. Appreciate pharmacy's assistance in arranging medications for discharged given she is uninsured (her policy was cancelled unfortunately during her illness for lack of payment).   Perirectal abscess = IR drain in place - fungal smear and AFB smear negative. GS pending. C/W purulent fluid/abscess. Will start Augmentin BID and adjust with micro    Medication Monitoring - SCr stable and potassium normal range.     PLAN: 1. Start pyrimethamine (200 mg x 1 then 75 mg QD)+ sulfadiazine + leucovorin  2. Defer ART until she  completes 2 weeks of treatment for toxo  3. Start Augmentin while we await GS and cultures from abscess drainage 4. Would presume she is ready for discharge in 1-2 days pending drain output. We can adjust antibiotics outpatient if needed.  5. Quantiferon in AM.    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 sodium chloride   Intravenous Once  . Chlorhexidine Gluconate Cloth  6 each Topical Daily  . dexamethasone (DECADRON) injection  4 mg Intravenous Q8H  . docusate sodium  100 mg Oral BID  . feeding supplement  237 mL Oral BID BM  . ferrous sulfate  325 mg Oral Q breakfast  . levETIRAcetam  750 mg Oral BID  . nystatin  5 mL Oral QID  . pantoprazole  40 mg Oral QHS  . sodium chloride flush  5 mL Intracatheter Q8H    SUBJECTIVE: Doing well - only concern is questions over insurance.    Review of Systems: Review of Systems  Constitutional: Negative for chills and fever.  HENT: Negative for tinnitus.   Eyes: Negative for blurred vision and photophobia.  Respiratory: Negative for cough and sputum production.  Cardiovascular: Negative for chest pain.  Gastrointestinal: Negative for diarrhea, nausea and vomiting.  Genitourinary: Negative for dysuria.  Musculoskeletal: Negative for back pain, joint pain and myalgias.  Skin: Negative for rash.  Neurological: Negative for speech change, weakness and headaches.  Psychiatric/Behavioral: Negative for depression.     No Known Allergies    OBJECTIVE: Vitals:   10/07/20 2006 10/07/20 2342 10/08/20 0359 10/08/20 0700  BP: 106/68 108/70 103/75 130/81  Pulse: 67 64 (!) 53 (!) 56  Resp: 20 18 20 15   Temp: 97.8 F (36.6 C) 97.6 F (36.4 C) 97.6 F (36.4 C)  97.9 F (36.6 C)  TempSrc: Oral Oral Oral Oral  SpO2: 99% 99% 96% 100%  Weight:      Height:       Body mass index is 30.1 kg/m.  Physical Exam Vitals reviewed.  Constitutional:      Appearance: She is well-developed.     Comments: Resting quietly in bed.   HENT:     Mouth/Throat:     Mouth: No oral lesions.     Dentition: Normal dentition. No dental abscesses.     Pharynx: No oropharyngeal exudate.  Cardiovascular:     Rate and Rhythm: Normal rate and regular rhythm.     Heart sounds: Normal heart sounds.  Pulmonary:     Effort: Pulmonary effort is normal.     Breath sounds: Normal breath sounds.  Abdominal:     General: There is no distension.     Palpations: Abdomen is soft.     Tenderness: There is no abdominal tenderness.  Genitourinary:    Comments: Drain in place with milky red drainage  Lymphadenopathy:     Cervical: No cervical adenopathy.  Skin:    General: Skin is warm and dry.     Findings: No rash.  Neurological:     Mental Status: She is alert and oriented to person, place, and time.  Psychiatric:        Judgment: Judgment normal.     Comments: In good spirits today and engaged in care discussion      Lab Results Lab Results  Component Value Date   WBC 5.4 10/08/2020   HGB 9.0 (L) 10/08/2020   HCT 31.0 (L) 10/08/2020   MCV 69.5 (L) 10/08/2020   PLT 190 10/08/2020    Lab Results  Component Value Date   CREATININE 0.71 10/08/2020   BUN 14 10/08/2020   NA 133 (L) 10/08/2020   K 3.8 10/08/2020   CL 103 10/08/2020   CO2 23 10/08/2020    Lab Results  Component Value Date   ALT 24 09/30/2020   AST 33 09/30/2020   ALKPHOS 60 09/30/2020   BILITOT 0.7 09/30/2020     Microbiology: Recent Results (from the past 240 hour(s))  Resp Panel by RT-PCR (Flu A&B, Covid) Nasopharyngeal Swab     Status: None   Collection Time: 09/29/20 10:45 PM   Specimen: Nasopharyngeal Swab; Nasopharyngeal(NP) swabs in vial transport medium  Result Value Ref  Range Status   SARS Coronavirus 2 by RT PCR NEGATIVE NEGATIVE Final    Comment: (NOTE) SARS-CoV-2 target nucleic acids are NOT DETECTED.  The SARS-CoV-2 RNA is generally detectable in upper respiratory specimens during the acute phase of infection. The lowest concentration of SARS-CoV-2 viral copies this assay can detect is 138 copies/mL. A negative result does not preclude SARS-Cov-2 infection and should not be used as the sole basis for treatment or other patient management decisions. A negative result  may occur with  improper specimen collection/handling, submission of specimen other than nasopharyngeal swab, presence of viral mutation(s) within the areas targeted by this assay, and inadequate number of viral copies(<138 copies/mL). A negative result must be combined with clinical observations, patient history, and epidemiological information. The expected result is Negative.  Fact Sheet for Patients:  EntrepreneurPulse.com.au  Fact Sheet for Healthcare Providers:  IncredibleEmployment.be  This test is no t yet approved or cleared by the [place] FDA and  has been authorized for detection and/or diagnosis of SARS-CoV-2 by FDA under an Emergency Use Authorization (EUA). This EUA will remain  in effect (meaning this test can be used) for the duration of the COVID-19 declaration under Section 564(b)(1) of the Act, 21 U.S.C.section 360bbb-3(b)(1), unless the authorization is terminated  or revoked sooner.       Influenza A by PCR NEGATIVE NEGATIVE Final   Influenza B by PCR NEGATIVE NEGATIVE Final    Comment: (NOTE) The Xpert Xpress SARS-CoV-2/FLU/RSV plus assay is intended as an aid in the diagnosis of influenza from Nasopharyngeal swab specimens and should not be used as a sole basis for treatment. Nasal washings and aspirates are unacceptable for Xpert Xpress SARS-CoV-2/FLU/RSV testing.  Fact Sheet for  Patients: EntrepreneurPulse.com.au  Fact Sheet for Healthcare Providers: IncredibleEmployment.be  This test is not yet approved or cleared by the [place] FDA and has been authorized for detection and/or diagnosis of SARS-CoV-2 by FDA under an Emergency Use Authorization (EUA). This EUA will remain in effect (meaning this test can be used) for the duration of the COVID-19 declaration under Section 564(b)(1) of the Act, 21 U.S.C. section 360bbb-3(b)(1), unless the authorization is terminated or revoked.  Performed at [facility], [place] [address], [place], [place] [address]   MRSA PCR Screening     Status: None   Collection Time: 09/30/20  1:07 AM   Specimen: Nasal Mucosa; Nasopharyngeal  Result Value Ref Range Status   MRSA by PCR NEGATIVE NEGATIVE Final    Comment:        The GeneXpert MRSA Assay (FDA approved for NASAL specimens only), is one component of a comprehensive MRSA colonization surveillance program. It is not intended to diagnose MRSA infection nor to guide or monitor treatment for MRSA infections. Performed at [facility], [place] [address], [place], [place] [address]   Culture, fungus without smear     Status: None (Preliminary result)   Collection Time: 10/02/20 12:36 PM   Specimen: PATH Other; Tissue  Result Value Ref Range Status   Specimen Description BIOPSY  Final   Special Requests LEFT POSTERIOR BRAIN  Final   Culture   Final    NO FUNGUS ISOLATED AFTER 5 DAYS Performed at [facility], [place] [address] [place], [place] [address]    Report Status PENDING  Incomplete  Acid Fast Smear (AFB)     Status: None   Collection Time: 10/02/20 12:36 PM   Specimen: PATH Other; Tissue  Result Value Ref Range Status   AFB Specimen Processing Comment  Final    Comment: Tissue Grinding and Digestion/Decontamination   Acid Fast Smear Negative  Final    Comment: (NOTE) Performed At: [facility] [place], [place] [id number] Rush Farmer MD YW:[id number]    Source (AFB) BIOPSY  Final    Comment: LEFT POSTERIOR BRAIN Performed at [facility], [place] [address] [place], [place] [address]      Janene Madeira, MSN, NP-C  [facility] [email or web address] Pager: [phone] Office: [phone] [place]: [phone]

## 2020-10-08 NOTE — Progress Notes (Signed)
Chief Complaint: Patient was seen today for follow up perirectal abscess drain  Supervising Physician: Daryll Brod  Patient Status: [facility] - In-pt  Subjective: S/p perc drain to perirectal abscess yesterday. Feels some better. A little sore at drain site as expected.  Objective: Physical Exam: BP 130/81 (BP Location: Left Arm)   Pulse (!) 56   Temp 97.9 F (36.6 C) (Oral)   Resp 15   Ht 5\' 8"  (1.727 m)   Wt 89.8 kg   SpO2 100%   BMI 30.10 kg/m  TG drain intact, site clean, NT Cloudy serosanguinous output   Current Facility-Administered Medications:  .  0.9 %  sodium chloride infusion (Manually program via Guardrails IV Fluids), , Intravenous, Once, Newman Pies, MD .  acetaminophen (TYLENOL) tablet 650 mg, 650 mg, Oral, Q6H PRN **OR** acetaminophen (TYLENOL) suppository 650 mg, 650 mg, Rectal, Q6H PRN, Newman Pies, MD .  acetaminophen (TYLENOL) tablet 650 mg, 650 mg, Oral, Q4H PRN **OR** acetaminophen (TYLENOL) suppository 650 mg, 650 mg, Rectal, Q4H PRN, Newman Pies, MD .  Chlorhexidine Gluconate Cloth 2 % PADS 6 each, 6 each, Topical, Daily, Newman Pies, MD, 6 each at 10/04/20 1342 .  [COMPLETED] dexamethasone (DECADRON) injection 6 mg, 6 mg, Intravenous, Q6H **FOLLOWED BY** [COMPLETED] dexamethasone (DECADRON) injection 4 mg, 4 mg, Intravenous, Q6H, 4 mg at 10/04/20 1243 **FOLLOWED BY** dexamethasone (DECADRON) injection 4 mg, 4 mg, Intravenous, Q8H, Newman Pies, MD, 4 mg at 10/08/20 0617 .  docusate sodium (COLACE) capsule 100 mg, 100 mg, Oral, BID, Newman Pies, MD, 100 mg at 10/08/20 0915 .  feeding supplement (ENSURE ENLIVE / ENSURE PLUS) liquid 237 mL, 237 mL, Oral, BID BM, Newman Pies, MD, 237 mL at 10/06/20 1341 .  fentaNYL (SUBLIMAZE) injection, , Intravenous, PRN, Mir, Paula Libra, MD, 50 mcg at 10/07/20 1341 .  ferrous sulfate tablet 325 mg, 325 mg, Oral, Q breakfast, Newman Pies, MD, 325 mg at 10/08/20 0915 .  hydrALAZINE  (APRESOLINE) injection 10 mg, 10 mg, Intravenous, QID PRN, Shelly Coss, MD, 10 mg at 10/03/20 1917 .  HYDROcodone-acetaminophen (NORCO/VICODIN) 5-325 MG per tablet 1 tablet, 1 tablet, Oral, Q4H PRN, Newman Pies, MD .  levETIRAcetam (KEPPRA) tablet 750 mg, 750 mg, Oral, BID, Tawanna Solo, Amrit, MD, 750 mg at 10/08/20 0915 .  LORazepam (ATIVAN) injection 2 mg, 2 mg, Intravenous, PRN, Newman Pies, MD .  midazolam (VERSED) injection, , Intravenous, PRN, Mir, Paula Libra, MD, 1 mg at 10/07/20 1341 .  morphine 2 MG/ML injection 2-4 mg, 2-4 mg, Intravenous, Q2H PRN, Newman Pies, MD, 2 mg at 10/03/20 0435 .  nystatin (MYCOSTATIN) 100000 UNIT/ML suspension 500,000 Units, 5 mL, Oral, QID, Newman Pies, MD, 500,000 Units at 10/08/20 0915 .  ondansetron (ZOFRAN) tablet 4 mg, 4 mg, Oral, Q4H PRN **OR** ondansetron (ZOFRAN) injection 4 mg, 4 mg, Intravenous, Q4H PRN, Newman Pies, MD .  pantoprazole (PROTONIX) EC tablet 40 mg, 40 mg, Oral, QHS, Adhikari, Amrit, MD, 40 mg at 10/07/20 2105 .  promethazine (PHENERGAN) tablet 12.5-25 mg, 12.5-25 mg, Oral, Q4H PRN, Newman Pies, MD .  sodium chloride flush (NS) 0.9 % injection 5 mL, 5 mL, Intracatheter, Q8H, Mir, Paula Libra, MD, 5 mL at 10/08/20 0617 .  sulfamethoxazole-trimethoprim (BACTRIM) 448.96 mg in dextrose 5 % 500 mL IVPB, 10 mg/kg/day, Intravenous, Q12H, Adhikari, Amrit, MD, Last Rate: 352 mL/hr at 10/08/20 0942, 448.96 mg at 10/08/20 0942  Labs: CBC Recent Labs    10/06/20 0754 10/08/20 0415  WBC 4.4 5.4  HGB 8.8*  9.0*  HCT 30.5* 31.0*  PLT 193 190   BMET Recent Labs    10/06/20 0754 10/08/20 0415  NA 134* 133*  K 3.7 3.8  CL 105 103  CO2 24 23  GLUCOSE 105* 100*  BUN 8 14  CREATININE 0.73 0.71  CALCIUM 9.0 8.9   LFT No results for input(s): PROT, ALBUMIN, AST, ALT, ALKPHOS, BILITOT, BILIDIR, IBILI, LIPASE in the last 72 hours. PT/INR No results for input(s): LABPROT, INR in the last 72  hours.   Studies/Results: CT IMAGE GUIDED DRAINAGE BY PERCUTANEOUS CATHETER  Result Date: 10/07/2020 INDICATION: 40 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 EXAM: CT GUIDED DRAINAGE OF PELVIC ABSCESS MEDICATIONS: The patient is currently admitted to the hospital and receiving intravenous antibiotics. The antibiotics were administered within an appropriate time frame prior to the initiation of the procedure. ANESTHESIA/SEDATION: 3 mg IV Versed 150 mcg IV Fentanyl Moderate Sedation Time:  18 minutes The patient was continuously monitored during the procedure by the interventional radiology nurse under my direct supervision. COMPLICATIONS: None immediate. TECHNIQUE: Informed written consent was obtained from the patient after a thorough discussion of the procedural risks, benefits and alternatives. All questions were addressed. Maximal Sterile Barrier Technique was utilized including caps, mask, sterile gowns, sterile gloves, sterile drape, hand hygiene and skin antiseptic. A timeout was performed prior to the initiation of the procedure. PROCEDURE: The posterior right pelvic skin was prepped with chlorhexidine in a sterile fashion, and a sterile drape was applied covering the operative field. A sterile gown and sterile gloves were used for the procedure. Local anesthesia was provided with 1% Lidocaine. Following local lidocaine administration, 19 gauge Yueh needle was inserted into the fluid collection. Purulent return was noted. The Yueh catheter was removed over 0.035 inch guidewire and tract dilation was performed with 10 and 12 [place] dilators. 10.2 [place] multipurpose pigtail drain was placed into the collection. Total of 30 mL of purulent material was aspirated. Samples were sent for Gram stain and culture. Drain secured to skin with suture and connected to bulb suction. IMPRESSION: 10.2 [place] multipurpose pigtail drain placed in  perirectal abscess. Electronically Signed   By: Miachel Roux M.D.   On: 10/07/2020 15:03    Assessment/Plan: Perirectal abscess s/p perc drain Flush as directed. Monitor output, repeat imaging when output minimal    LOS: 8 days   I spent a total of 15 minutes in face to face in clinical consultation, greater than 50% of which was counseling/coordinating care for perirectal abscess drain  Ascencion Dike PA-C 10/08/2020 10:28 AM

## 2020-10-08 NOTE — Plan of Care (Signed)
  Problem: Education: Goal: Knowledge of General Education information will improve Description: Including pain rating scale, medication(s)/side effects and non-pharmacologic comfort measures Outcome: Progressing   Problem: Health Behavior/Discharge Planning: Goal: Ability to manage health-related needs will improve Outcome: Progressing   Problem: Clinical Measurements: Goal: Ability to maintain clinical measurements within normal limits will improve Outcome: Progressing Goal: Respiratory complications will improve Outcome: Progressing Goal: Cardiovascular complication will be avoided Outcome: Progressing   Problem: Activity: Goal: Risk for activity intolerance will decrease Outcome: Progressing   Problem: Nutrition: Goal: Adequate nutrition will be maintained Outcome: Progressing   Problem: Coping: Goal: Level of anxiety will decrease Outcome: Progressing   Problem: Elimination: Goal: Will not experience complications related to urinary retention Outcome: Progressing   Problem: Pain Managment: Goal: General experience of comfort will improve Outcome: Progressing   

## 2020-10-08 NOTE — Progress Notes (Signed)
  Speech Language Pathology Treatment: Dysphagia  Patient Details Name: Candace Jones MRN: [id number] DOB: [birth year] Today's Date: 10/08/2020 Time: 4098-1191 SLP Time Calculation (min) (ACUTE ONLY): 13.82 min  Assessment / Plan / Recommendation Clinical Impression  Pt was seen for dysphagia treatment with her sisters present. Pt's sisters reported that the pt's [place] is back to baseline, but that the pt is demonstrating difficulty with word retrieval and sentence formulation in [place]. Pt's family denied baseline fluency in [place], but stated that her verbal expression in English is worse than at baseline. Pt required intermittent translation but responded to many of the SLP's questions in [place]. Pt's diet was advanced to regular texture solids and thin liquids on 4/26. Pt's family reported that the pt has been tolerating this well and signs of aspiration were denied. Pt tolerated trials of mixed consistencies, regular texture solids, and thin liquids via straw using consecutive swallows. Mild residue was noted in the left lateral sulcus which was improved with a liquid wash. Pt's current diet of regular texture solids and thin liquids will be continued. SLP will continue to follow pt.    HPI HPI: 40 yr old admitted with aphasia and right-sided weakness. 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      SLP Plan  Continue with current plan of care       Recommendations  Diet recommendations: Regular;Thin liquid Liquids provided via: Cup;Straw Medication Administration: Whole meds with liquid Supervision: Intermittent supervision to cue for compensatory  strategies Compensations: Minimize environmental distractions;Slow rate;Small sips/bites;Monitor for anterior loss;Other (Comment);Follow solids with liquid (monitor for pocketing) Postural Changes and/or Swallow Maneuvers: Seated upright 90 degrees                Oral Care Recommendations: Oral care BID Follow up Recommendations:  (tba) SLP Visit Diagnosis: Dysphagia, unspecified (R13.10) Plan: Continue with current plan of care       Lakenzie Mcclafferty I. Hardin Negus, [place], [place] Office number [phone] Pager [place] 10/08/2020, 5:17 PM

## 2020-10-08 NOTE — Progress Notes (Signed)
RN removed 8 staples from L scalp per MD order. Site clean and dry with no signs of infection. Patient tolerated well.

## 2020-10-08 NOTE — Progress Notes (Signed)
PROGRESS NOTE    Candace Jones  LZJ:[id number] DOB: 17-Dec-1980 DOA: 09/29/2020 PCP: Patient, No Pcp Per (Inactive)    Brief Narrative:  Candace Jones is a 40 year old female from [place] with no significant past medical history who was brought to the emergency department on 4/18 with complaints of aphasia, right facial droop, right-sided weakness.  Patient also reported mild headache over the last few days with severe menorrhagia.  In the ED, patient was found to be confused and code stroke was initiated.  She had a generalized tonic-clonic seizure in the ED.  CT head without contrast with possible left-sided brain mass with edema.  MRI with 2 cystic brain masses in the left cerebral hemisphere with extensive vasogenic edema and mild midline shift.  Hemoglobin 6.  Neurosurgery, neurology was consulted.  Patient was loaded with Keppra.  Transfused with PRBC.  Hospitalist service consulted for further evaluation and management.   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  CT head without contrast with possible left-sided brain mass with edema.  MRI with 2 cystic brain masses left cerebral hemisphere with extensive vasogenic edema and mild midline shift.  Neurology and neurosurgery was consulted.  Patient developed tonic-clonic seizure in the ED and was loaded on Keppra and started on IV steroids.  EEG 10/01/2020 with cortical dysfunction left temporal region likely secondary to underlying structural abnormality.  Patient underwent left posterior temporal craniectomy for open brain biopsy and cultures by  neurosurgery, Dr. Arnoldo Morale on 10/02/2020.  Surgical pathology consistent with necrosis and mixed inflammation and organisms consistent with toxoplasmosis. --ID following, appreciate assistance --Neurosurgery now signed off, outpatient follow-up as needed --pyrimethamine 200 mg x 1 then 75 mg QD --sulfadiazine 1500mg  PO QID --leucovorin 25mg  PO daily --ID plan 6-week course --ID follow-up scheduled on 10/17/2020 at [place] [facility] with Doren Custard, NP  Seizure --Continue Decadron taper --Keppra 750 mg p.o. twice daily --outpatient f/u with neurology  Per [facility] statutes, patients with seizures are not allowed to drive until they have been seizure-free for six months. Use caution when using heavy equipment or power tools. Avoid working on ladders or at heights. Take showers instead of baths. Ensure the water temperature is not too high on the home water heater. Do not go swimming alone. When caring for infants or small children, sit down when holding, feeding, or changing them to minimize risk of injury to the child in the event you have a seizure.  To reduce risk of seizures, maintain good sleep hygiene avoid alcohol and illicit drug use, take all anti-seizure medications as prescribed.   HIV/AIDS CD4 <35 --Defer ART until 2 weeks of toxo treatment completed --Bactrim SS for PJP prophylaxis --Pending hepatitis panel and QuantiFERON gold in a.m. --Outpatient follow-up with ID  Perirectal abscess Patient underwent IR drain placement on 10/07/2020.  Fungal smear and AFB smear negative. --Gram stain: Pending --Culture: Pending --Augmentin twice daily --Continue monitor drain output  Uterine fibroids CT abdomen/pelvis notable for significantly large uterus with multiple uterine lesions.  MRI abdomen/pelvis with bulky enlargement of the uterus with multiple uterine masses, largest measuring 10  x 10 x 9.6 cm with suspicion for leiomyosarcoma.  Evaluated by GYN/ONC on 10/02/2020, uterus with  likely fibroids rather than leiomyosarcoma.   --Outpatient follow-up with GYN  Bradycardia Patient was persistently bradycardic with heart rate in the range of 40-60.  She is asymptomatic. echocardiogram showed ejection fraction of 55 to 60%, no other abnormalities.  Monitor on telemetry.  Denies any lightheadedness or dizziness.  HR stable now  Microcytic hypochromic anemia Patient with multiple heavy menstrual cycles, patient was transfused with PRBCs, IV iron during hospitalization.  Etiology likely secondary to her uterine fibroids. --Hemoglobin stable, 9.0 --Outpatient follow-up with GYN  Hypokalemia: Resolved Repleted.   DVT prophylaxis:    Code Status: Full Code Family Communication:   Disposition Plan:  Level of care: Telemetry Medical Status is: Inpatient  Remains inpatient appropriate because:Unsafe d/c plan, IV treatments appropriate due to intensity of illness or inability to take PO and Inpatient level of care appropriate due to severity of illness   Dispo: The patient is from: Home              Anticipated d/c is to: Home              Patient currently is not medically stable to d/c.   Difficult to place patient No    Consultants:   Neurology  Oncology  Neurosurgery  GYN/ONC  Infectious disease  Procedures:   Left posterior temporal craniotomy for open brain biopsy and cultures, Dr. Arnoldo Morale 10/02/2020  Antimicrobials:   IV Bactrim 4/22 - 4/27  Pyrimethamine 4/28>>  Bactrim PO 4/27>>  Augmentin 4/27>>  Sulfadiazine 4/26>>   Subjective: Patient seen and examined at bedside, family members present.  Sitting at edge of bed watching TV.  Aided with Market researcher # [id number].  No specific complaints this morning.  Denies headache, no visual changes, no chest pain, no palpitations, no shortness of breath, no abdominal pain, no weakness, no fatigue, no paresthesias.  No acute events overnight per nursing staff.  ID plans to switch IV  antibiotics to oral today.  Possible DC 1-2 days.  Objective: Vitals:   10/07/20 2342 10/08/20 0359 10/08/20 0700 10/08/20 1100  BP: 108/70 103/75 130/81 109/75  Pulse: 64 (!) 53 (!) 56 61  Resp: 18 20 15 15   Temp: 97.6 F (36.4 C) 97.6 F (36.4 C) 97.9 F (36.6 C) 98.1 F (36.7 C)  TempSrc: Oral Oral Oral Oral  SpO2: 99% 96% 100% 100%  Weight:      Height:        Intake/Output Summary (Last 24 hours) at 10/08/2020 1339 Last data filed at 10/08/2020 0800 Gross per 24 hour  Intake 10 ml  Output 10 ml  Net 0 ml   Filed Weights   09/29/20 1925  Weight: 89.8 kg    Examination:  General exam: Appears calm and comfortable  HEENT: PERRL, EOMI, surgical incision site clean/dry/intact Respiratory system: Clear to auscultation. Respiratory effort normal.  On room air Cardiovascular system: S1 & S2 heard, RRR. No JVD, murmurs, rubs, gallops or clicks. No pedal edema. Gastrointestinal system: Abdomen is nondistended, soft and nontender. No organomegaly or masses felt. Normal bowel sounds heard. Central nervous system: Alert and oriented. No focal neurological deficits. Extremities: Symmetric 5 x 5 power. Skin: Dry/healing macular papular rash to face, trunk and upper extremities, no ulcers Psychiatry: Judgement and insight appear normal. Mood & affect appropriate.     Data Reviewed: I have personally reviewed following labs and imaging studies  CBC: Recent  Labs  Lab 10/02/20 0456 10/02/20 1117 10/03/20 0420 10/05/20 0359 10/06/20 0754 10/08/20 0415  WBC 4.4  --  7.2 4.6 4.4 5.4  NEUTROABS 3.4  --  5.8 2.3 3.2 3.9  HGB 7.1* 9.2* 8.1* 7.7* 8.8* 9.0*  HCT 25.5* 27.0* 28.0* 26.9* 30.5* 31.0*  MCV 64.6*  --  65.9* 66.7* 68.4* 69.5*  PLT 196  --  187 172 193 202   Basic Metabolic Panel: Recent Labs  Lab 10/01/20 2222 10/02/20 0456 10/02/20 0456 10/02/20 1117 10/03/20 0420 10/05/20 0359 10/06/20 0754 10/08/20 0415  NA  --  136   < > 140 138 135 134* 133*  K  --   4.4   < > 3.9 4.0 3.1* 3.7 3.8  CL  --  109  --   --  109 105 105 103  CO2  --  21*  --   --  21* 24 24 23   GLUCOSE  --  115*  --   --  111* 83 105* 100*  BUN  --  12  --   --  15 11 8 14   CREATININE  --  0.49  --   --  0.59 0.79 0.73 0.71  CALCIUM  --  8.8*  --   --  8.4* 8.4* 9.0 8.9  MG 2.2  --   --   --   --   --   --   --   PHOS 3.6  --   --   --   --   --   --   --    < > = values in this interval not displayed.   GFR: Estimated Creatinine Clearance: 109.6 mL/min (by C-G formula based on SCr of 0.71 mg/dL). Liver Function Tests: No results for input(s): AST, ALT, ALKPHOS, BILITOT, PROT, ALBUMIN in the last 168 hours. No results for input(s): LIPASE, AMYLASE in the last 168 hours. No results for input(s): AMMONIA in the last 168 hours. Coagulation Profile: No results for input(s): INR, PROTIME in the last 168 hours. Cardiac Enzymes: No results for input(s): CKTOTAL, CKMB, CKMBINDEX, TROPONINI in the last 168 hours. BNP (last 3 results) No results for input(s): PROBNP in the last 8760 hours. HbA1C: No results for input(s): HGBA1C in the last 72 hours. CBG: Recent Labs  Lab 10/02/20 1559 10/02/20 2333 10/03/20 1228 10/03/20 1744 10/03/20 2324  GLUCAP 115* 127* 106* 113* 129*   Lipid Profile: No results for input(s): CHOL, HDL, LDLCALC, TRIG, CHOLHDL, LDLDIRECT in the last 72 hours. Thyroid Function Tests: No results for input(s): TSH, T4TOTAL, FREET4, T3FREE, THYROIDAB in the last 72 hours. Anemia Panel: No results for input(s): VITAMINB12, FOLATE, FERRITIN, TIBC, IRON, RETICCTPCT in the last 72 hours. Sepsis Labs: No results for input(s): PROCALCITON, LATICACIDVEN in the last 168 hours.  Recent Results (from the past 240 hour(s))  Resp Panel by RT-PCR (Flu A&B, Covid) Nasopharyngeal Swab     Status: None   Collection Time: 09/29/20 10:45 PM   Specimen: Nasopharyngeal Swab; Nasopharyngeal(NP) swabs in vial transport medium  Result Value Ref Range Status   SARS  Coronavirus 2 by RT PCR NEGATIVE NEGATIVE Final    Comment: (NOTE) SARS-CoV-2 target nucleic acids are NOT DETECTED.  The SARS-CoV-2 RNA is generally detectable in upper respiratory specimens during the acute phase of infection. The lowest concentration of SARS-CoV-2 viral copies this assay can detect is 138 copies/mL. A negative result does not preclude SARS-Cov-2 infection and should not be used as the sole basis for  treatment or other patient management decisions. A negative result may occur with  improper specimen collection/handling, submission of specimen other than nasopharyngeal swab, presence of viral mutation(s) within the areas targeted by this assay, and inadequate number of viral copies(<138 copies/mL). A negative result must be combined with clinical observations, patient history, and epidemiological information. The expected result is Negative.  Fact Sheet for Patients:  EntrepreneurPulse.com.au  Fact Sheet for Healthcare Providers:  IncredibleEmployment.be  This test is no t yet approved or cleared by the [place] FDA and  has been authorized for detection and/or diagnosis of SARS-CoV-2 by FDA under an Emergency Use Authorization (EUA). This EUA will remain  in effect (meaning this test can be used) for the duration of the COVID-19 declaration under Section 564(b)(1) of the Act, 21 U.S.C.section 360bbb-3(b)(1), unless the authorization is terminated  or revoked sooner.       Influenza A by PCR NEGATIVE NEGATIVE Final   Influenza B by PCR NEGATIVE NEGATIVE Final    Comment: (NOTE) The Xpert Xpress SARS-CoV-2/FLU/RSV plus assay is intended as an aid in the diagnosis of influenza from Nasopharyngeal swab specimens and should not be used as a sole basis for treatment. Nasal washings and aspirates are unacceptable for Xpert Xpress SARS-CoV-2/FLU/RSV testing.  Fact Sheet for  Patients: EntrepreneurPulse.com.au  Fact Sheet for Healthcare Providers: IncredibleEmployment.be  This test is not yet approved or cleared by the [place] FDA and has been authorized for detection and/or diagnosis of SARS-CoV-2 by FDA under an Emergency Use Authorization (EUA). This EUA will remain in effect (meaning this test can be used) for the duration of the COVID-19 declaration under Section 564(b)(1) of the Act, 21 U.S.C. section 360bbb-3(b)(1), unless the authorization is terminated or revoked.  Performed at [facility], [place] [address] [place], [place] [address]   MRSA PCR Screening     Status: None   Collection Time: 09/30/20  1:07 AM   Specimen: Nasal Mucosa; Nasopharyngeal  Result Value Ref Range Status   MRSA by PCR NEGATIVE NEGATIVE Final    Comment:        The GeneXpert MRSA Assay (FDA approved for NASAL specimens only), is one component of a comprehensive MRSA colonization surveillance program. It is not intended to diagnose MRSA infection nor to guide or monitor treatment for MRSA infections. Performed at [facility], [place] [address] [place], [place] [address]   Culture, fungus without smear     Status: None (Preliminary result)   Collection Time: 10/02/20 12:36 PM   Specimen: PATH Other; Tissue  Result Value Ref Range Status   Specimen Description BIOPSY  Final   Special Requests LEFT POSTERIOR BRAIN  Final   Culture   Final    NO FUNGUS ISOLATED AFTER 6 DAYS Performed at [facility] [address] [place], [place] [address]    Report Status PENDING  Incomplete  Acid Fast Smear (AFB)     Status: None   Collection Time: 10/02/20 12:36 PM   Specimen: PATH Other; Tissue  Result Value Ref Range Status   AFB Specimen Processing Comment  Final    Comment: Tissue Grinding and Digestion/Decontamination   Acid Fast Smear Negative  Final    Comment: (NOTE) Performed At: [facility] [place], [place] [id number] Rush Farmer MD [id number]    Source (AFB) BIOPSY  Final    Comment: LEFT POSTERIOR BRAIN Performed at [facility], [place] [address], [place], [place] [address]  Aerobic/Anaerobic Culture (surgical/deep wound)     Status: None (Preliminary result)   Collection Time: 10/07/20  1:48 PM   Specimen: Abscess  Result Value Ref Range Status   Specimen Description ABSCESS  Final   Special Requests Immunocompromised  Final   Gram Stain   Final    FEW WBC PRESENT, PREDOMINANTLY PMN NO ORGANISMS SEEN    Culture   Final    NO GROWTH < 24 HOURS Performed at [facility], [place] [address], [place], [place] [address]    Report Status PENDING  Incomplete         Radiology Studies: CT IMAGE GUIDED DRAINAGE BY PERCUTANEOUS CATHETER  Result Date: 10/07/2020 INDICATION: 40 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 EXAM: CT GUIDED DRAINAGE OF PELVIC ABSCESS MEDICATIONS: The patient is currently admitted to the hospital and receiving intravenous antibiotics. The antibiotics were administered within an appropriate time frame prior to the initiation of the procedure. ANESTHESIA/SEDATION: 3 mg IV Versed 150 mcg IV Fentanyl Moderate Sedation Time:  18 minutes The patient was continuously monitored during the procedure by the interventional radiology nurse under my direct supervision. COMPLICATIONS: None immediate. TECHNIQUE: Informed written consent was obtained from the patient after a thorough discussion of the procedural risks, benefits and alternatives. All questions were addressed. Maximal Sterile Barrier Technique was utilized including caps, mask, sterile gowns, sterile gloves, sterile drape, hand hygiene and skin antiseptic. A timeout was performed prior to the initiation of the procedure. PROCEDURE: The posterior right pelvic skin was prepped with chlorhexidine in a  sterile fashion, and a sterile drape was applied covering the operative field. A sterile gown and sterile gloves were used for the procedure. Local anesthesia was provided with 1% Lidocaine. Following local lidocaine administration, 19 gauge Yueh needle was inserted into the fluid collection. Purulent return was noted. The Yueh catheter was removed over 0.035 inch guidewire and tract dilation was performed with 10 and 12 [place] dilators. 10.2 [place] multipurpose pigtail drain was placed into the collection. Total of 30 mL of purulent material was aspirated. Samples were sent for Gram stain and culture. Drain secured to skin with suture and connected to bulb suction. IMPRESSION: 10.2 [place] multipurpose pigtail drain placed in perirectal abscess. Electronically Signed   By: Miachel Roux M.D.   On: 10/07/2020 15:03        Scheduled Meds: . sodium chloride   Intravenous Once  . amoxicillin-clavulanate  1 tablet Oral Q12H  . Chlorhexidine Gluconate Cloth  6 each Topical Daily  . dexamethasone (DECADRON) injection  4 mg Intravenous Q12H  . docusate sodium  100 mg Oral BID  . feeding supplement  237 mL Oral BID BM  . ferrous sulfate  325 mg Oral Q breakfast  . leucovorin  25 mg Oral Daily  . levETIRAcetam  750 mg Oral BID  . nystatin  5 mL Oral QID  . pantoprazole  40 mg Oral QHS  . pyrimethamine  200 mg Oral BID WC   Followed by  . [START ON 10/09/2020] pyrimethamine  75 mg Oral Q breakfast  . sodium chloride flush  5 mL Intracatheter Q8H  . sulfaDIAZINE  1,500 mg Oral QID  . sulfamethoxazole-trimethoprim  1 tablet Oral Q12H   Continuous Infusions:   LOS: 8 days    Time spent: 38 minutes spent on chart review, discussion with nursing staff, consultants, updating family and interview/physical exam; more than 50% of that time was spent in  counseling and/or coordination of care.    Alfonza Toft J [place] DO [facility] Available via Epic secure chat 7am-7pm After these hours, please  refer to coverage provider listed on [email or web address] 10/08/2020, 1:39 PM

## 2020-10-09 ENCOUNTER — Other Ambulatory Visit (HOSPITAL_COMMUNITY): Payer: Self-pay

## 2020-10-09 ENCOUNTER — Encounter: Payer: Self-pay | Admitting: Student

## 2020-10-09 DIAGNOSIS — R197 Diarrhea, unspecified: Secondary | ICD-10-CM

## 2020-10-09 LAB — GLUCOSE 6 PHOSPHATE DEHYDROGENASE
G6PDH: 12.8 U/g{Hb} (ref 4.7–14.6)
Hemoglobin: 8.8 g/dL — ABNORMAL LOW (ref 11.1–15.9)

## 2020-10-09 LAB — HCV INTERPRETATION

## 2020-10-09 LAB — HCV AB W REFLEX TO QUANT PCR: HCV Ab: 0.3 s/co ratio (ref 0.0–0.9)

## 2020-10-09 LAB — HEPATITIS B SURFACE ANTIBODY, QUANTITATIVE: Hep B S AB Quant (Post): <3.1 m[IU]/mL — ABNORMAL LOW (ref >9.9)

## 2020-10-09 MED ORDER — LEVETIRACETAM 750 MG PO TABS
750.0000 mg | ORAL_TABLET | Freq: Two times a day (BID) | ORAL | 2 refills | Status: DC
  Filled 2020-10-09: qty 60, 30d supply, fill #0

## 2020-10-09 MED ORDER — AMOXICILLIN-POT CLAVULANATE 875-125 MG PO TABS
1.0000 | ORAL_TABLET | Freq: Two times a day (BID) | ORAL | 0 refills | Status: AC
  Filled 2020-10-09: qty 10, 5d supply, fill #0

## 2020-10-09 MED ORDER — DEXAMETHASONE SODIUM PHOSPHATE 4 MG/ML IJ SOLN
4.0000 mg | INTRAMUSCULAR | Status: AC
  Administered 2020-10-09: 4 mg via INTRAVENOUS
  Filled 2020-10-09: qty 1

## 2020-10-09 MED ORDER — FERROUS SULFATE 325 (65 FE) MG PO TABS
325.0000 mg | ORAL_TABLET | Freq: Every day | ORAL | 0 refills | Status: DC
  Filled 2020-10-09: qty 90, 90d supply, fill #0

## 2020-10-09 MED ORDER — SULFAMETHOXAZOLE-TRIMETHOPRIM 400-80 MG PO TABS
1.0000 | ORAL_TABLET | Freq: Every day | ORAL | Status: DC
  Filled 2020-10-09: qty 1

## 2020-10-09 MED ORDER — SULFAMETHOXAZOLE-TRIMETHOPRIM 400-80 MG PO TABS
1.0000 | ORAL_TABLET | Freq: Every day | ORAL | 0 refills | Status: DC
  Filled 2020-10-09: qty 30, 30d supply, fill #0

## 2020-10-09 NOTE — Progress Notes (Signed)
[name]  Date of Admission:  09/29/2020      Total days of antibiotics 7  Day 2 pyrimethamine , leucovorin, sulfadiazine   bactrim reduced to proph dose 4/27          ASSESSMENT: Candace Jones is a 40 y.o. 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 All CNS symptoms have resolved, no need for corticosteroids at this point. Surgical incision is well-healing. Doing well from [place] stand point and they signed off. We have access to ID pertinent medications worked out for 2 weeks; urgent paperwork has been completed for [facility] to help get access for the remainder to complete 6 weeks.   HIV, AIDS+ = Appt next Friday arranged with me in clinic to follow up on treatment and consider starting ARVs at that time [facility] likely). Hep B sAg negative with seroprotection against hep A/B. RPR negative. STI screen and pap smear to be collected outpatient. G6PD pending.   Uninsured = see above, ADAP pending  Perirectal abscess = IR drain in place, 15 cc output overnight. Hopeful that this can be removed tomorrow. IR following and planning to repeat imaging once output low. Will treat with augmentin x 7d and adjust based on cultures if needed.   Diarrhea= antibiotic associated. Reasonable to do symptomatic treatment with imodium to help slow down frequency for now.    Medication Monitoring - SCr stable and potassium normal range.     PLAN: 1. Continue pyrimethamine 75 mg QD + sulfadiazine + leucovorin  2. SS bactrim daily for PJP prophylaxis.  3. Continue Augmentin x 7d.  4. Follow repeat CT scan for drainage and reassess duration at outpatient visit next week.  5. Follow up micro data from drained abscess    Appts Set:     Principal Problem:   AIDS with toxoplasmosis (Candace Jones) Active Problems:   AIDS (acquired immune deficiency  syndrome) (HCC)   Intracranial mass   Seizure (HCC)   Microcytic hypochromic anemia   Brain mass   Ankle pain   Uterine mass   Perirectal abscess   MRI contraindicated due to metal implant   . sodium chloride   Intravenous Once  . amoxicillin-clavulanate  1 tablet Oral Q12H  . Chlorhexidine Gluconate Cloth  6 each Topical Daily  . dexamethasone (DECADRON) injection  4 mg Intravenous Q24H  . docusate sodium  100 mg Oral BID  . feeding supplement  237 mL Oral BID BM  . ferrous sulfate  325 mg Oral Q breakfast  . leucovorin  25 mg Oral Daily  . levETIRAcetam  750 mg Oral BID  . nystatin  5 mL Oral QID  . pantoprazole  40 mg Oral QHS  . pyrimethamine  75 mg Oral Q breakfast  . sodium chloride flush  5 mL Intracatheter Q8H  . sulfaDIAZINE  1,500 mg Oral QID  . [START ON 10/10/2020] sulfamethoxazole-trimethoprim  1 tablet Oral Daily    SUBJECTIVE: Doing well - having some increased bowel frequency and low appetite today. 5 stools yesterday. No abdominal cramping but just not hungry.    Review of Systems: Review of Systems  Constitutional: Negative for chills and fever.  HENT: Negative for tinnitus.   Eyes: Negative for blurred vision and photophobia.  Respiratory: Negative for cough and sputum production.   Cardiovascular: Negative for chest pain.  Gastrointestinal: Positive for diarrhea. Negative for abdominal pain, nausea and vomiting.       Poor appetite   Genitourinary: Negative for dysuria.  Musculoskeletal: Negative for back pain, joint pain and myalgias.  Skin: Negative for rash.  Neurological: Negative for speech change, weakness and headaches.  Psychiatric/Behavioral: Negative for depression.     No Known Allergies    OBJECTIVE: Vitals:   10/08/20 2010 10/09/20 0000 10/09/20 0300 10/09/20 0700  BP: 109/78 109/68 (!) 105/58 107/71  Pulse: 84 61 (!) 59 63  Resp:    15  Temp: 97.8 F (36.6 C) 97.8 F (36.6 C) (!) 97.5 F (36.4 C) (!) 97.4 F (36.3 C)   TempSrc: Oral Oral Oral Oral  SpO2: 100% 100% 100% 98%  Weight:      Height:       Body mass index is 30.1 kg/m.  Physical Exam Vitals reviewed.  Constitutional:      Appearance: She is well-developed.     Comments: Resting quietly in bed.   HENT:     Mouth/Throat:     Mouth: No oral lesions.     Dentition: Normal dentition. No dental abscesses.     Pharynx: No oropharyngeal exudate.  Cardiovascular:     Rate and Rhythm: Normal rate and regular rhythm.     Heart sounds: Normal heart sounds.  Pulmonary:     Effort: Pulmonary effort is normal.     Breath sounds: Normal breath sounds.  Abdominal:     General: There is no distension.     Palpations: Abdomen is soft.     Tenderness: There is no abdominal tenderness.  Genitourinary:    Comments: Scant thin red drainage today in bulb Lymphadenopathy:     Cervical: No cervical adenopathy.  Skin:    General: Skin is warm and dry.     Capillary Refill: Capillary refill takes less than 2 seconds.     Findings: No rash.  Neurological:     Mental Status: She is alert and oriented to person, place, and time.  Psychiatric:        Judgment: Judgment normal.     Comments: In good spirits today and engaged in care discussion      Lab Results Lab Results  Component Value Date   WBC 5.4 10/08/2020   HGB 9.0 (L) 10/08/2020   HCT 31.0 (L) 10/08/2020   MCV 69.5 (L) 10/08/2020   PLT 190 10/08/2020    Lab Results  Component Value Date   CREATININE 0.71 10/08/2020   BUN 14 10/08/2020   NA 133 (L) 10/08/2020   K 3.8 10/08/2020   CL 103 10/08/2020   CO2 23 10/08/2020    Lab Results  Component Value Date   ALT 24 09/30/2020   AST 33 09/30/2020   ALKPHOS 60 09/30/2020   BILITOT 0.7 09/30/2020     Microbiology: Recent Results (from the past 240 hour(s))  Resp Panel by RT-PCR (Flu A&B, Covid) Nasopharyngeal Swab     Status: None   Collection Time: 09/29/20 10:45 PM   Specimen: Nasopharyngeal Swab; Nasopharyngeal(NP) swabs  in vial transport medium  Result Value Ref Range Status   SARS Coronavirus 2 by RT PCR NEGATIVE NEGATIVE Final    Comment: (NOTE) SARS-CoV-2 target nucleic acids are NOT DETECTED.  The SARS-CoV-2 RNA is generally detectable in upper respiratory specimens during the acute phase of infection. The lowest concentration of SARS-CoV-2 viral copies this assay can detect is 138 copies/mL. A negative result does not preclude  SARS-Cov-2 infection and should not be used as the sole basis for treatment or other patient management decisions. A negative result may occur with  improper specimen collection/handling, submission of specimen other than nasopharyngeal swab, presence of viral mutation(s) within the areas targeted by this assay, and inadequate number of viral copies(<138 copies/mL). A negative result must be combined with clinical observations, patient history, and epidemiological information. The expected result is Negative.  Fact Sheet for Patients:  EntrepreneurPulse.com.au  Fact Sheet for Healthcare Providers:  IncredibleEmployment.be  This test is no t yet approved or cleared by the [place] FDA and  has been authorized for detection and/or diagnosis of SARS-CoV-2 by FDA under an Emergency Use Authorization (EUA). This EUA will remain  in effect (meaning this test can be used) for the duration of the COVID-19 declaration under Section 564(b)(1) of the Act, 21 U.S.C.section 360bbb-3(b)(1), unless the authorization is terminated  or revoked sooner.       Influenza A by PCR NEGATIVE NEGATIVE Final   Influenza B by PCR NEGATIVE NEGATIVE Final    Comment: (NOTE) The Xpert Xpress SARS-CoV-2/FLU/RSV plus assay is intended as an aid in the diagnosis of influenza from Nasopharyngeal swab specimens and should not be used as a sole basis for treatment. Nasal washings and aspirates are unacceptable for Xpert Xpress  SARS-CoV-2/FLU/RSV testing.  Fact Sheet for Patients: EntrepreneurPulse.com.au  Fact Sheet for Healthcare Providers: IncredibleEmployment.be  This test is not yet approved or cleared by the [place] FDA and has been authorized for detection and/or diagnosis of SARS-CoV-2 by FDA under an Emergency Use Authorization (EUA). This EUA will remain in effect (meaning this test can be used) for the duration of the COVID-19 declaration under Section 564(b)(1) of the Act, 21 U.S.C. section 360bbb-3(b)(1), unless the authorization is terminated or revoked.  Performed at [facility], [place] [address] [place], [place] [address]   MRSA PCR Screening     Status: None   Collection Time: 09/30/20  1:07 AM   Specimen: Nasal Mucosa; Nasopharyngeal  Result Value Ref Range Status   MRSA by PCR NEGATIVE NEGATIVE Final    Comment:        The GeneXpert MRSA Assay (FDA approved for NASAL specimens only), is one component of a comprehensive MRSA colonization surveillance program. It is not intended to diagnose MRSA infection nor to guide or monitor treatment for MRSA infections. Performed at [facility], [place] [address], [place], [place] [address]   Culture, fungus without smear     Status: None (Preliminary result)   Collection Time: 10/02/20 12:36 PM   Specimen: PATH Other; Tissue  Result Value Ref Range Status   Specimen Description BIOPSY  Final   Special Requests LEFT POSTERIOR BRAIN  Final   Culture   Final    NO FUNGUS ISOLATED AFTER 7 DAYS Performed at [facility], [place] [address] [place], [place] [address]    Report Status PENDING  Incomplete  Acid Fast Smear (AFB)     Status: None   Collection Time: 10/02/20 12:36 PM   Specimen: PATH Other; Tissue  Result Value Ref Range Status   AFB Specimen Processing Comment  Final    Comment: Tissue Grinding and Digestion/Decontamination   Acid Fast Smear Negative  Final    Comment:  (NOTE) Performed At: [facility] [place], [place] [id number] Rush Farmer MD OZ:[id number]    Source (AFB) BIOPSY  Final    Comment: LEFT POSTERIOR BRAIN Performed  at [facility], [place] [address] [place], [place] [address]   Aerobic/Anaerobic Culture (surgical/deep wound)     Status: None (Preliminary result)   Collection Time: 10/07/20  1:48 PM   Specimen: Abscess  Result Value Ref Range Status   Specimen Description ABSCESS  Final   Special Requests Immunocompromised  Final   Gram Stain   Final    FEW WBC PRESENT, PREDOMINANTLY PMN NO ORGANISMS SEEN    Culture   Final    NO GROWTH < 24 HOURS Performed at [facility], [place] [address] [place], [place] [address]    Report Status PENDING  Incomplete  Culture, fungus without smear     Status: None (Preliminary result)   Collection Time: 10/07/20  3:14 PM   Specimen: Perirectal; Abscess  Result Value Ref Range Status   Specimen Description PERIRECTAL  Final   Special Requests Immunocompromised  Final   Culture   Final    NO FUNGUS ISOLATED AFTER 1 DAY Performed at [facility], [address], [place], [place] [address]    Report Status PENDING  Incomplete     Janene Madeira, MSN, NP-C [place] for Infectious Disease [email or web address] Pager: [phone] Office: [phone] [place]: [phone]

## 2020-10-09 NOTE — Progress Notes (Signed)
Chief Complaint: Patient was seen today for follow up perirectal abscess  Supervising Physician: Jacqulynn Cadet  Patient Status: [facility] - In-pt  Subjective: Denies pain related to drain today.  Sister at bedside.  All questions answered.   Objective: Physical Exam: BP 107/71 (BP Location: Left Arm)   Pulse 63   Temp (!) 97.4 F (36.3 C) (Oral)   Resp 15   Ht 5\' 8"  (1.727 m)   Wt 197 lb 15.6 oz (89.8 kg)   SpO2 98%   BMI 30.10 kg/m  NAD, alert Abdomen: Right TG drain in place. Insertion site c/d/i.  No tenderness.  Output remains cloudy serosanguinous-- 15 mL out overnight.  Flushes easily. Aspiration yield, thick fluid with debris.    Current Facility-Administered Medications:  .  0.9 %  sodium chloride infusion (Manually program via Guardrails IV Fluids), , Intravenous, Once, Newman Pies, MD .  acetaminophen (TYLENOL) tablet 650 mg, 650 mg, Oral, Q6H PRN **OR** acetaminophen (TYLENOL) suppository 650 mg, 650 mg, Rectal, Q6H PRN, Newman Pies, MD .  acetaminophen (TYLENOL) tablet 650 mg, 650 mg, Oral, Q4H PRN **OR** acetaminophen (TYLENOL) suppository 650 mg, 650 mg, Rectal, Q4H PRN, Newman Pies, MD .  amoxicillin-clavulanate (AUGMENTIN) 875-125 MG per tablet 1 tablet, 1 tablet, Oral, Q12H, Matinecock Callas, NP, 1 tablet at 10/09/20 1019 .  Chlorhexidine Gluconate Cloth 2 % PADS 6 each, 6 each, Topical, Daily, Newman Pies, MD, 6 each at 10/09/20 1025 .  [COMPLETED] dexamethasone (DECADRON) injection 6 mg, 6 mg, Intravenous, Q6H **FOLLOWED BY** [COMPLETED] dexamethasone (DECADRON) injection 4 mg, 4 mg, Intravenous, Q6H, 4 mg at 10/04/20 1243 **FOLLOWED BY** dexamethasone (DECADRON) injection 4 mg, 4 mg, Intravenous, Q24H, [place] Eric J, DO .  docusate sodium (COLACE) capsule 100 mg, 100 mg, Oral, BID, Newman Pies, MD, 100 mg at 10/09/20 1019 .  feeding supplement (ENSURE ENLIVE / ENSURE PLUS) liquid 237 mL, 237 mL, Oral, BID BM, Newman Pies,  MD, 237 mL at 10/06/20 1341 .  fentaNYL (SUBLIMAZE) injection, , Intravenous, PRN, Mir, Paula Libra, MD, 50 mcg at 10/07/20 1341 .  ferrous sulfate tablet 325 mg, 325 mg, Oral, Q breakfast, Newman Pies, MD, 325 mg at 10/09/20 0806 .  hydrALAZINE (APRESOLINE) injection 10 mg, 10 mg, Intravenous, QID PRN, Shelly Coss, MD, 10 mg at 10/03/20 1917 .  HYDROcodone-acetaminophen (NORCO/VICODIN) 5-325 MG per tablet 1 tablet, 1 tablet, Oral, Q4H PRN, Newman Pies, MD .  leucovorin [employer]) tablet 25 mg, 25 mg, Oral, Daily, Vu, Trung T, MD, 25 mg at 10/09/20 1019 .  levETIRAcetam (KEPPRA) tablet 750 mg, 750 mg, Oral, BID, Adhikari, Amrit, MD, 750 mg at 10/09/20 1020 .  LORazepam (ATIVAN) injection 2 mg, 2 mg, Intravenous, PRN, Newman Pies, MD .  midazolam (VERSED) injection, , Intravenous, PRN, Mir, Paula Libra, MD, 1 mg at 10/07/20 1341 .  morphine 2 MG/ML injection 2-4 mg, 2-4 mg, Intravenous, Q2H PRN, Newman Pies, MD, 2 mg at 10/03/20 0435 .  nystatin (MYCOSTATIN) 100000 UNIT/ML suspension 500,000 Units, 5 mL, Oral, QID, Newman Pies, MD, 500,000 Units at 10/09/20 1020 .  ondansetron (ZOFRAN) tablet 4 mg, 4 mg, Oral, Q4H PRN **OR** ondansetron (ZOFRAN) injection 4 mg, 4 mg, Intravenous, Q4H PRN, Newman Pies, MD .  pantoprazole (PROTONIX) EC tablet 40 mg, 40 mg, Oral, QHS, Adhikari, Amrit, MD, 40 mg at 10/08/20 2130 .  promethazine (PHENERGAN) tablet 12.5-25 mg, 12.5-25 mg, Oral, Q4H PRN, Newman Pies, MD .  Margrett Rud pyrimethamine (DARAPRIM) tablet 200 mg, 200 mg, Oral, BID  WC, 200 mg at 10/08/20 1748 **FOLLOWED BY** pyrimethamine (DARAPRIM) tablet 75 mg, 75 mg, Oral, Q breakfast, Vu, Trung T, MD, 75 mg at 10/09/20 0804 .  sodium chloride flush (NS) 0.9 % injection 5 mL, 5 mL, Intracatheter, Q8H, Mir, Paula Libra, MD, 5 mL at 10/09/20 0624 .  sulfaDIAZINE tablet 1,500 mg, 1,500 mg, Oral, QID, Vu, Trung T, MD, 1,500 mg at 10/09/20 1020 .  [START ON 10/10/2020]  sulfamethoxazole-trimethoprim (BACTRIM) 400-80 MG per tablet 1 tablet, 1 tablet, Oral, Daily, Dixon, Melton Krebs, NP  Labs: CBC Recent Labs    10/08/20 0415  WBC 5.4  HGB 9.0*  HCT 31.0*  PLT 190   BMET Recent Labs    10/08/20 0415  NA 133*  K 3.8  CL 103  CO2 23  GLUCOSE 100*  BUN 14  CREATININE 0.71  CALCIUM 8.9   LFT No results for input(s): PROT, ALBUMIN, AST, ALT, ALKPHOS, BILITOT, BILIDIR, IBILI, LIPASE in the last 72 hours. PT/INR No results for input(s): LABPROT, INR in the last 72 hours.   Studies/Results: CT IMAGE GUIDED DRAINAGE BY PERCUTANEOUS CATHETER  Result Date: 10/07/2020 INDICATION: 40 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 EXAM: CT GUIDED DRAINAGE OF PELVIC ABSCESS MEDICATIONS: The patient is currently admitted to the hospital and receiving intravenous antibiotics. The antibiotics were administered within an appropriate time frame prior to the initiation of the procedure. ANESTHESIA/SEDATION: 3 mg IV Versed 150 mcg IV Fentanyl Moderate Sedation Time:  18 minutes The patient was continuously monitored during the procedure by the interventional radiology nurse under my direct supervision. COMPLICATIONS: None immediate. TECHNIQUE: Informed written consent was obtained from the patient after a thorough discussion of the procedural risks, benefits and alternatives. All questions were addressed. Maximal Sterile Barrier Technique was utilized including caps, mask, sterile gowns, sterile gloves, sterile drape, hand hygiene and skin antiseptic. A timeout was performed prior to the initiation of the procedure. PROCEDURE: The posterior right pelvic skin was prepped with chlorhexidine in a sterile fashion, and a sterile drape was applied covering the operative field. A sterile gown and sterile gloves were used for the procedure. Local anesthesia was provided with 1% Lidocaine. Following local lidocaine  administration, 19 gauge Yueh needle was inserted into the fluid collection. Purulent return was noted. The Yueh catheter was removed over 0.035 inch guidewire and tract dilation was performed with 10 and 12 [place] dilators. 10.2 [place] multipurpose pigtail drain was placed into the collection. Total of 30 mL of purulent material was aspirated. Samples were sent for Gram stain and culture. Drain secured to skin with suture and connected to bulb suction. IMPRESSION: 10.2 [place] multipurpose pigtail drain placed in perirectal abscess. Electronically Signed   By: Miachel Roux M.D.   On: 10/07/2020 15:03    Assessment/Plan: Perirectal abscess s/p percutaneous TG drain placement 4/26 Drain remains in place.  Serosanguinous output in bulb.  Flush yield, thick, debris-containing fluid.  Flush as directed. Monitor output, repeat imaging when output minimal   LOS: 9 days   I spent a total of 15 minutes in face to face in clinical consultation, greater than 50% of which was counseling/coordinating care for perirectal abscess drain  Docia Barrier PA-C 10/09/2020 12:12 PM

## 2020-10-09 NOTE — TOC Progression Note (Addendum)
  1Transition of Care [facility]) - Progression Note    Patient Details  Name: Candace Jones MRN: [id number] Date of Birth: 1981/04/24  Transition of Care [facility]) CM/SW [place], RN Phone Number: 10/09/2020, 8:08 AM  Clinical Narrative:    Patient on oral antibiotics/antinfectants. Office working on some coverage through [employer]. Post hospitalization visit scheduled at community [facility] wellness. [place] program for medications, SEND MEDICATIONS TO TOC PHARMACY tomorrow. Expected discharge de[ends on drainage, likely Friday or Saturday.   [place] done for patient,TOC pharmacy aware   Expected Discharge Plan: Home/Self Care    Expected Discharge Plan and Services Expected Discharge Plan: Home/Self Care   Discharge Planning Services: CM [facility],Medication Assistance,Follow-up appt [facility]   Living arrangements for the past 2 months: Apartment                           HH Arranged: NA HH Agency: NA         Social Determinants of Health (SDOH) Interventions    Readmission Risk Interventions No flowsheet data found.

## 2020-10-09 NOTE — Progress Notes (Signed)
PROGRESS NOTE    Candace Jones  [id number] DOB: 04/29/1981 DOA: 09/29/2020 PCP: Candace Jones, No Pcp Per (Inactive)    Brief Narrative:  Candace Jones is a 40 year old female from [place] with no significant past medical history who was brought to the emergency department on 4/18 with complaints of aphasia, right facial droop, right-sided weakness.  Candace Jones also reported mild headache over the last few days with severe menorrhagia.  In the ED, Candace Jones was found to be confused and code stroke was initiated.  She had a generalized tonic-clonic seizure in the ED.  CT head without contrast with possible left-sided brain mass with edema.  MRI with 2 cystic brain masses in the left cerebral hemisphere with extensive vasogenic edema and mild midline shift.  Hemoglobin 6.  Neurosurgery, neurology was consulted.  Candace Jones was loaded with Keppra.  Transfused with PRBC.  Hospitalist service consulted for further evaluation and management.   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  CT head without contrast with possible left-sided brain mass with edema.  MRI with 2 cystic brain masses left cerebral hemisphere with extensive vasogenic edema and mild midline shift.  Neurology and neurosurgery was consulted.  Candace Jones developed tonic-clonic seizure in the ED and was loaded on Keppra and started on IV steroids.  EEG 10/01/2020 with cortical dysfunction left temporal region likely secondary to underlying structural abnormality.  Candace Jones underwent left posterior temporal craniectomy for open brain biopsy and cultures by  neurosurgery, Dr. Arnoldo Morale on 10/02/2020.  Surgical pathology consistent with necrosis and mixed inflammation and organisms consistent with toxoplasmosis. --ID following, appreciate assistance --Neurosurgery now signed off, outpatient follow-up as needed --pyrimethamine 200 mg x 1 then 75 mg QD --sulfadiazine 1500mg  PO QID --leucovorin 25mg  PO daily --ID plan 6-week course --ID follow-up scheduled on 10/17/2020 at [facility] with Janene Madeira, NP  Seizure --Continue Decadron 4mg  IV  q24h today then will discontinue --Keppra 750 mg p.o. twice daily --outpatient f/u with neurology  Per [facility] statutes, patients with seizures are not allowed to drive until they have been seizure-free for six months. Use caution when using heavy equipment or power tools. Avoid working on ladders or at heights. Take showers instead of baths. Ensure the water temperature is not too high on the home water heater. Do not go swimming alone. When caring for infants or small children, sit down when holding, feeding, or changing them to minimize risk of injury to the child in the event you have a seizure.  To reduce risk of seizures, maintain good sleep hygiene avoid alcohol and illicit drug use, take all anti-seizure medications as prescribed.   HIV/AIDS CD4 <35 --Defer ART until 2 weeks of toxo treatment completed --Bactrim SS for PJP prophylaxis --Pending hepatitis panel and QuantiFERON gold in a.m. --Outpatient follow-up with ID  Perirectal abscess Candace Jones underwent IR drain placement on 10/07/2020.  Fungal smear and AFB smear negative. --Gram stain: no organisms seen --Culture: with no growth x 2 days --Augmentin twice daily, plan 7 day course per ID --Continue monitor drain output, 47mL out past 24h  Uterine fibroids CT abdomen/pelvis  notable for significantly large uterus with multiple uterine lesions.  MRI abdomen/pelvis with bulky enlargement of the uterus with multiple uterine masses, largest  measuring 10 x 10 x 9.6 cm with suspicion for leiomyosarcoma.  Evaluated by GYN/ONC on 10/02/2020, uterus with likely fibroids rather than leiomyosarcoma.   --Outpatient follow-up with GYN  Bradycardia Candace Jones was persistently bradycardic with heart rate in the range of 40-60.  She is asymptomatic. echocardiogram showed ejection fraction of 55 to 60%, no other abnormalities.  Monitor on telemetry.  Denies any lightheadedness or dizziness.  HR stable now  Microcytic hypochromic anemia Candace Jones with multiple heavy menstrual cycles, Candace Jones was transfused with PRBCs, IV iron during hospitalization.  Etiology likely secondary to her uterine fibroids. --Hemoglobin stable, 9.0 --Outpatient follow-up with GYN  Hypokalemia: Resolved Repleted.   DVT prophylaxis:    Code Status: Full Code Family Communication:   Disposition Plan:  Level of care: Telemetry Medical Status is: Inpatient  Remains inpatient appropriate because:Unsafe d/c plan, IV treatments appropriate due to intensity of illness or inability to take PO and Inpatient level of care appropriate due to severity of illness   Dispo: The Candace Jones is from: Home              Anticipated d/c is to: Home              Candace Jones currently is not medically stable to d/c.   Difficult to place Candace Jones No    Consultants:   Neurology  Oncology  Neurosurgery  GYN/ONC  Infectious disease  Procedures:   Left posterior temporal craniotomy for open brain biopsy and cultures, Dr. Arnoldo Morale 10/02/2020  Antimicrobials:   IV Bactrim 4/22 - 4/27  Pyrimethamine 4/28>>  Bactrim PO 4/27>>  Augmentin 4/27>>  Sulfadiazine 4/26>>   Subjective: Candace Jones seen and examined at bedside, family member present.  Sitting at edge of bed watching TV.  Aided with Hospital doctor # [id number].  No specific complaints this morning.  Denies headache, no visual changes, no chest pain, no palpitations, no shortness of breath, no abdominal pain, no  weakness, no fatigue, no paresthesias.  No acute events overnight per nursing staff.   Objective: Vitals:   10/08/20 2010 10/09/20 0000 10/09/20 0300 10/09/20 0700  BP: 109/78 109/68 (!) 105/58 107/71  Pulse: 84 61 (!) 59 63  Resp:    15  Temp: 97.8 F (36.6 C) 97.8 F (36.6 C) (!) 97.5 F (36.4 C) (!) 97.4 F (36.3 C)  TempSrc: Oral Oral Oral Oral  SpO2: 100% 100% 100% 98%  Weight:      Height:        Intake/Output Summary (Last 24 hours) at 10/09/2020 1318 Last data filed at 10/09/2020 0600 Gross per 24 hour  Intake 130 ml  Output 15 ml  Net 115 ml   Filed Weights   09/29/20 1925  Weight: 89.8 kg    Examination:  General exam: Appears calm and comfortable  HEENT: PERRL, EOMI, surgical incision site clean/dry/intact Respiratory system: Clear to auscultation. Respiratory effort normal.  On room air Cardiovascular system: S1 & S2 heard, RRR. No JVD, murmurs, rubs, gallops or clicks. No pedal edema. Gastrointestinal system: Abdomen is nondistended, soft and nontender. No organomegaly or masses felt. Normal bowel sounds heard. Central nervous system: Alert and oriented. No focal neurological deficits. Extremities: Symmetric 5 x 5 power. Skin: Dry/healing macular papular rash to face, trunk and upper extremities, no ulcers Psychiatry: Judgement and insight appear normal. Mood & affect appropriate.     Data  Reviewed: I have personally reviewed following labs and imaging studies  CBC: Recent Labs  Lab 10/03/20 0420 10/05/20 0359 10/06/20 0754 10/08/20 0415  WBC 7.2 4.6 4.4 5.4  NEUTROABS 5.8 2.3 3.2 3.9  HGB 8.1* 7.7* 8.8* 9.0*  HCT 28.0* 26.9* 30.5* 31.0*  MCV 65.9* 66.7* 68.4* 69.5*  PLT 187 172 193 [id number]   Basic Metabolic Panel: Recent Labs  Lab 10/03/20 0420 10/05/20 0359 10/06/20 0754 10/08/20 0415  NA 138 135 134* 133*  K 4.0 3.1* 3.7 3.8  CL 109 105 105 103  CO2 21* 24 24 23   GLUCOSE 111* 83 105* 100*  BUN 15 11 8 14   CREATININE 0.59 0.79 0.73  0.71  CALCIUM 8.4* 8.4* 9.0 8.9   GFR: Estimated Creatinine Clearance: 109.6 mL/min (by C-G formula based on SCr of 0.71 mg/dL). Liver Function Tests: No results for input(s): AST, ALT, ALKPHOS, BILITOT, PROT, ALBUMIN in the last 168 hours. No results for input(s): LIPASE, AMYLASE in the last 168 hours. No results for input(s): AMMONIA in the last 168 hours. Coagulation Profile: No results for input(s): INR, PROTIME in the last 168 hours. Cardiac Enzymes: No results for input(s): CKTOTAL, CKMB, CKMBINDEX, TROPONINI in the last 168 hours. BNP (last 3 results) No results for input(s): PROBNP in the last 8760 hours. HbA1C: No results for input(s): HGBA1C in the last 72 hours. CBG: Recent Labs  Lab 10/02/20 1559 10/02/20 2333 10/03/20 1228 10/03/20 1744 10/03/20 2324  GLUCAP 115* 127* 106* 113* 129*   Lipid Profile: No results for input(s): CHOL, HDL, LDLCALC, TRIG, CHOLHDL, LDLDIRECT in the last 72 hours. Thyroid Function Tests: No results for input(s): TSH, T4TOTAL, FREET4, T3FREE, THYROIDAB in the last 72 hours. Anemia Panel: No results for input(s): VITAMINB12, FOLATE, FERRITIN, TIBC, IRON, RETICCTPCT in the last 72 hours. Sepsis Labs: No results for input(s): PROCALCITON, LATICACIDVEN in the last 168 hours.  Recent Results (from the past 240 hour(s))  Resp Panel by RT-PCR (Flu A&B, Covid) Nasopharyngeal Swab     Status: None   Collection Time: 09/29/20 10:45 PM   Specimen: Nasopharyngeal Swab; Nasopharyngeal(NP) swabs in vial transport medium  Result Value Ref Range Status   SARS Coronavirus 2 by RT PCR NEGATIVE NEGATIVE Final    Comment: (NOTE) SARS-CoV-2 target nucleic acids are NOT DETECTED.  The SARS-CoV-2 RNA is generally detectable in upper respiratory specimens during the acute phase of infection. The lowest concentration of SARS-CoV-2 viral copies this assay can detect is 138 copies/mL. A negative result does not preclude SARS-Cov-2 infection and should not  be used as the sole basis for treatment or other Candace Jones management decisions. A negative result may occur with  improper specimen collection/handling, submission of specimen other than nasopharyngeal swab, presence of viral mutation(s) within the areas targeted by this assay, and inadequate number of viral copies(<138 copies/mL). A negative result must be combined with clinical observations, Candace Jones history, and epidemiological information. The expected result is Negative.  Fact Sheet for Patients:  EntrepreneurPulse.com.au  Fact Sheet for Healthcare Providers:  IncredibleEmployment.be  This test is no t yet approved or cleared by the [place] FDA and  has been authorized for detection and/or diagnosis of SARS-CoV-2 by FDA under an Emergency Use Authorization (EUA). This EUA will remain  in effect (meaning this test can be used) for the duration of the COVID-19 declaration under Section 564(b)(1) of the Act, 21 U.S.C.section 360bbb-3(b)(1), unless the authorization is terminated  or revoked sooner.       Influenza A by  PCR NEGATIVE NEGATIVE Final   Influenza B by PCR NEGATIVE NEGATIVE Final    Comment: (NOTE) The Xpert Xpress SARS-CoV-2/FLU/RSV plus assay is intended as an aid in the diagnosis of influenza from Nasopharyngeal swab specimens and should not be used as a sole basis for treatment. Nasal washings and aspirates are unacceptable for Xpert Xpress SARS-CoV-2/FLU/RSV testing.  Fact Sheet for Patients: EntrepreneurPulse.com.au  Fact Sheet for Healthcare Providers: IncredibleEmployment.be  This test is not yet approved or cleared by the [place] FDA and has been authorized for detection and/or diagnosis of SARS-CoV-2 by FDA under an Emergency Use Authorization (EUA). This EUA will remain in effect (meaning this test can be used) for the duration of the COVID-19 declaration under Section  564(b)(1) of the Act, 21 U.S.C. section 360bbb-3(b)(1), unless the authorization is terminated or revoked.  Performed at [facility], [place] [address] [place], [place] [address]   MRSA PCR Screening     Status: None   Collection Time: 09/30/20  1:07 AM   Specimen: Nasal Mucosa; Nasopharyngeal  Result Value Ref Range Status   MRSA by PCR NEGATIVE NEGATIVE Final    Comment:        The GeneXpert MRSA Assay (FDA approved for NASAL specimens only), is one component of a comprehensive MRSA colonization surveillance program. It is not intended to diagnose MRSA infection nor to guide or monitor treatment for MRSA infections. Performed at [facility], [place] [address], [place], [place] [address]   Culture, fungus without smear     Status: None (Preliminary result)   Collection Time: 10/02/20 12:36 PM   Specimen: PATH Other; Tissue  Result Value Ref Range Status   Specimen Description BIOPSY  Final   Special Requests LEFT POSTERIOR BRAIN  Final   Culture   Final    NO FUNGUS ISOLATED AFTER 7 DAYS Performed at [facility], [place] [address], [place], [place] [address]    Report Status PENDING  Incomplete  Acid Fast Smear (AFB)     Status: None   Collection Time: 10/02/20 12:36 PM   Specimen: PATH Other; Tissue  Result Value Ref Range Status   AFB Specimen Processing Comment  Final    Comment: Tissue Grinding and Digestion/Decontamination   Acid Fast Smear Negative  Final    Comment: (NOTE) Performed At: [facility] [place], [place] [id number] Rush Farmer MD PJ:[id number]    Source (AFB) BIOPSY  Final    Comment: LEFT POSTERIOR BRAIN Performed at [facility], [place] [address], [place], [place] [address]   Aerobic/Anaerobic Culture (surgical/deep wound)     Status: None (Preliminary result)   Collection Time: 10/07/20  1:48 PM   Specimen: Abscess  Result Value Ref Range Status   Specimen Description ABSCESS  Final   Special  Requests Immunocompromised  Final   Gram Stain   Final    FEW WBC PRESENT, PREDOMINANTLY PMN NO ORGANISMS SEEN    Culture   Final    NO GROWTH 2 DAYS Performed at [facility], [place] [address] [place], [place] [address]    Report Status PENDING  Incomplete  Culture, fungus without smear     Status: None (Preliminary result)   Collection Time: 10/07/20  3:14 PM   Specimen: Perirectal; Abscess  Result Value Ref Range Status   Specimen Description PERIRECTAL  Final   Special Requests Immunocompromised  Final   Culture   Final    NO FUNGUS  ISOLATED AFTER 1 DAY Performed at [facility], [place] [address], [place], [place] [address]    Report Status PENDING  Incomplete         Radiology Studies: CT IMAGE GUIDED DRAINAGE BY PERCUTANEOUS CATHETER  Result Date: 10/07/2020 INDICATION: 40 year old Candace Jones with history of HIV presents today measure radiology for drainage of pelvic fluid collection. EXAM: CT GUIDED DRAINAGE OF PELVIC ABSCESS MEDICATIONS: The Candace Jones is currently admitted to the hospital and receiving intravenous antibiotics. The antibiotics were administered within an appropriate time frame prior to the initiation of the procedure. ANESTHESIA/SEDATION: 3 mg IV Versed 150 mcg IV Fentanyl Moderate Sedation Time:  18 minutes The Candace Jones was continuously monitored during the procedure by the interventional radiology nurse under my direct supervision. COMPLICATIONS: None immediate. TECHNIQUE: Informed written consent was obtained from the Candace Jones after a thorough discussion of the procedural risks, benefits and alternatives. All questions were addressed. Maximal Sterile Barrier Technique was utilized including caps, mask, sterile gowns, sterile gloves, sterile drape, hand hygiene and skin antiseptic. A timeout was performed prior to the initiation of the procedure. PROCEDURE: The posterior right pelvic skin was prepped with chlorhexidine in a sterile fashion, and a sterile drape  was applied covering the operative field. A sterile gown and sterile gloves were used for the procedure. Local anesthesia was provided with 1% Lidocaine. Following local lidocaine administration, 19 gauge Yueh needle was inserted into the fluid collection. Purulent return was noted. The Yueh catheter was removed over 0.035 inch guidewire and tract dilation was performed with 10 and 12 [place] dilators. 10.2 [place] multipurpose pigtail drain was placed into the collection. Total of 30 mL of purulent material was aspirated. Samples were sent for Gram stain and culture. Drain secured to skin with suture and connected to bulb suction. IMPRESSION: 10.2 [place] multipurpose pigtail drain placed in perirectal abscess. Electronically Signed   By: Miachel Roux M.D.   On: 10/07/2020 15:03        Scheduled Meds: . sodium chloride   Intravenous Once  . amoxicillin-clavulanate  1 tablet Oral Q12H  . Chlorhexidine Gluconate Cloth  6 each Topical Daily  . dexamethasone (DECADRON) injection  4 mg Intravenous Q24H  . docusate sodium  100 mg Oral BID  . feeding supplement  237 mL Oral BID BM  . ferrous sulfate  325 mg Oral Q breakfast  . leucovorin  25 mg Oral Daily  . levETIRAcetam  750 mg Oral BID  . nystatin  5 mL Oral QID  . pantoprazole  40 mg Oral QHS  . pyrimethamine  75 mg Oral Q breakfast  . sodium chloride flush  5 mL Intracatheter Q8H  . sulfaDIAZINE  1,500 mg Oral QID  . [START ON 10/10/2020] sulfamethoxazole-trimethoprim  1 tablet Oral Daily   Continuous Infusions:   LOS: 9 days    Time spent: 35 minutes spent on chart review, discussion with nursing staff, consultants, updating family and interview/physical exam; more than 50% of that time was spent in counseling and/or coordination of care.    Catilyn Boggus J [place] DO [facility] Available via Epic secure chat 7am-7pm After these hours, please refer to coverage provider listed on [email or web address] 10/09/2020, 1:18 PM

## 2020-10-10 ENCOUNTER — Other Ambulatory Visit (HOSPITAL_COMMUNITY): Payer: Self-pay

## 2020-10-10 DIAGNOSIS — T65891A Toxic effect of other specified substances, accidental (unintentional), initial encounter: Secondary | ICD-10-CM

## 2020-10-10 DIAGNOSIS — K521 Toxic gastroenteritis and colitis: Secondary | ICD-10-CM

## 2020-10-10 MED ORDER — PANTOPRAZOLE SODIUM 40 MG PO TBEC
40.0000 mg | DELAYED_RELEASE_TABLET | Freq: Every day | ORAL | 0 refills | Status: DC
Start: 2020-10-10 — End: 2020-10-31
  Filled 2020-10-10: qty 30, 30d supply, fill #0

## 2020-10-10 MED ORDER — PYRIMETHAMINE 25 MG PO TABS
75.0000 mg | ORAL_TABLET | Freq: Every day | ORAL | 0 refills | Status: DC
  Filled 2020-10-10: qty 90, 30d supply, fill #0

## 2020-10-10 MED ORDER — DOCUSATE SODIUM 100 MG PO CAPS
100.0000 mg | ORAL_CAPSULE | Freq: Two times a day (BID) | ORAL | 0 refills | Status: DC
  Filled 2020-10-10: qty 30, 15d supply, fill #0

## 2020-10-10 NOTE — Discharge Summary (Signed)
[facility]  Physician Discharge Summary   Patient ID: Candace Jones MRN: [id number] DOB/AGE: 41/41/1982 41 y.o.  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 Patient will need referral to GYN for her uterine fibroids 2. Follow-up arranged with infectious disease    Home Health: None Equipment/Devices: None  CODE STATUS: Full code  DISCHARGE CONDITION: fair  Diet recommendation: Regular as tolerated  INITIAL HISTORY: Candace Jones is a 40 year old female from [place] with no significant past medical history who was brought to the emergency department on 4/18 with complaints of aphasia, right facial droop, right-sided weakness.  Patient also reported mild headache over the last few days with severe menorrhagia.  In the ED, patient was found to be confused and code stroke was initiated.  She had a generalized tonic-clonic seizure in the ED.  CT head without contrast with possible left-sided brain mass with edema.  MRI with 2 cystic brain masses in the left cerebral hemisphere with extensive vasogenic edema and mild midline shift.  Hemoglobin 6.  Neurosurgery, neurology was consulted.  Patient was loaded with Keppra.  Transfused with PRBC.  Hospitalist service consulted for further evaluation and management.   Consultants:   Neurology  Oncology  Neurosurgery  GYN/ONC  Infectious disease  Procedures:   Left posterior temporal craniotomy for open brain biopsy and cultures, Dr. Arnoldo Morale 10/02/2020  Drain placement for perirectal abscess    HOSPITAL COURSE:   CNS toxoplasmosis Patient presenting to ED with right facial droop, right-sided weakness and aphasia.  CT head without contrast with possible  left-sided brain mass with edema.  MRI with 2 cystic brain masses left cerebral hemisphere with extensive vasogenic edema and mild midline shift.  Neurology and neurosurgery was consulted.  Patient developed tonic-clonic seizure in the ED and was loaded on Keppra and started on IV steroids.  EEG 10/01/2020 with cortical dysfunction left temporal region likely secondary to underlying structural abnormality.  Patient underwent left posterior temporal craniectomy for open brain biopsy and cultures by neurosurgery, Dr. Arnoldo Morale on 10/02/2020.  Surgical pathology consistent with necrosis and mixed inflammation and organisms consistent with toxoplasmosis. Patient was being followed by ID.  Plan is to discharge her on sulfadiazine, pyrimethamine, leucovorin.  Outpatient follow-up with ID.  Outpatient follow-up with neurosurgery.  Seizure Was given dexamethasone in the hospital but not continued at discharge.  Will be discharged on Keppra. --outpatient f/u with neurology.  She was told that she cannot drive or operate machinery until cleared to do so by neurology.  HIV/AIDS CD4 <35.  ID to initiate antiretroviral treatment in the near future after she has completed treatment for toxoplasmosis.  Perirectal abscess Patient underwent IR drain placement on 10/07/2020.  Fungal smear and AFB smear negative. Cultures have been negative so far.  7-day course of Augmentin.  Drain to be kept in for now.  May need to repeat imaging in the outpatient setting and then to decide on removal of drain at that time.  Uterine fibroids CT abdomen/pelvis notable for significantly large uterus with multiple uterine lesions.  MRI abdomen/pelvis with bulky enlargement of the uterus with multiple uterine masses, largest measuring 10 x 10 x 9.6 cm with suspicion for leiomyosarcoma.  Evaluated by GYN/ONC on 10/02/2020, uterus with likely fibroids rather than leiomyosarcoma.   --Outpatient follow-up  with GYN  Bradycardia Patient  waspersistently bradycardic with heart rate in the range of 40-60. Echocardiogram showed ejection fraction of 55 to 60%, no other abnormalities.  Possibly due to her intracranial lesion.  Heart rate has improved.  Microcytic hypochromic anemia Patient with multiple heavy menstrual cycles, patient was transfused with PRBCs, IV iron during hospitalization.  Etiology likely secondary to her uterine fibroids. --Outpatient follow-up with GYN  Hypokalemia: Resolved Repleted.  Obesity Estimated body mass index is 30.1 kg/m as calculated from the following:   Height as of this encounter: 5\' 8"  (1.727 m).   Weight as of this encounter: 89.8 kg.  Patient is stable.  Okay for discharge home today.  Discussed with her sister.  PERTINENT LABS:  The results of significant diagnostics from this hospitalization (including imaging, microbiology, ancillary and laboratory) are listed below for reference.    Microbiology: Recent Results (from the past 240 hour(s))  Culture, fungus without smear     Status: None (Preliminary result)   Collection Time: 10/02/20 12:36 PM   Specimen: PATH Other; Tissue  Result Value Ref Range Status   Specimen Description BIOPSY  Final   Special Requests LEFT POSTERIOR BRAIN  Final   Culture   Final    NO FUNGUS ISOLATED AFTER 8 DAYS Performed at [facility] [address] [place], [place] [address]    Report Status PENDING  Incomplete  Acid Fast Smear (AFB)     Status: None   Collection Time: 10/02/20 12:36 PM   Specimen: PATH Other; Tissue  Result Value Ref Range Status   AFB Specimen Processing Comment  Final    Comment: Tissue Grinding and Digestion/Decontamination   Acid Fast Smear Negative  Final    Comment: (NOTE) Performed At: [facility] [place], [place] [id number] Rush Farmer MD [id number]    Source (AFB) BIOPSY  Final    Comment: LEFT POSTERIOR BRAIN Performed at [facility], [place] [address]  [place], [place] [address]   Aerobic/Anaerobic Culture (surgical/deep wound)     Status: None (Preliminary result)   Collection Time: 10/07/20  1:48 PM   Specimen: Abscess  Result Value Ref Range Status   Specimen Description ABSCESS  Final   Special Requests Immunocompromised  Final   Gram Stain   Final    FEW WBC PRESENT, PREDOMINANTLY PMN NO ORGANISMS SEEN    Culture   Final    NO GROWTH 3 DAYS NO ANAEROBES ISOLATED; CULTURE IN PROGRESS FOR 5 DAYS Performed at [facility] [address], [place], [place] [address]    Report Status PENDING  Incomplete  Culture, fungus without smear     Status: None (Preliminary result)   Collection Time: 10/07/20  3:14 PM   Specimen: Perirectal; Abscess  Result Value Ref Range Status   Specimen Description PERIRECTAL  Final   Special Requests Immunocompromised  Final   Culture   Final    NO FUNGUS ISOLATED AFTER 2 DAYS Performed at [facility], [address], [place], [place] [address]    Report Status PENDING  Incomplete     Labs:  COVID-19 Labs   Lab Results  Component Value Date   [place] NEGATIVE 09/29/2020      Basic Metabolic Panel: Recent Labs  Lab 10/05/20 0359 10/06/20 0754 10/08/20 0415  NA 135 134* 133*  K 3.1* 3.7 3.8  CL 105 105 103  CO2 24 24 23   GLUCOSE 83 105* 100*  BUN  11 8 14   CREATININE 0.79 0.73 0.71  CALCIUM 8.4* 9.0 8.9   CBC: Recent Labs  Lab 10/05/20 0359 10/06/20 0754 10/08/20 0415  WBC 4.6 4.4 5.4  NEUTROABS 2.3 3.2 3.9  HGB 7.7* 8.8* 9.0*  8.8*  HCT 26.9* 30.5* 31.0*  MCV 66.7* 68.4* 69.5*  PLT 172 193 190     IMAGING STUDIES CT ANGIO HEAD W OR WO CONTRAST  Result Date: 09/29/2020 CLINICAL DATA:  Acute neuro deficit, aphasia.  Right-sided deficit EXAM: CT ANGIOGRAPHY HEAD AND NECK CT PERFUSION BRAIN TECHNIQUE: Multidetector CT imaging of the head and neck was performed using the standard protocol during bolus administration of intravenous contrast. Multiplanar CT  image reconstructions and MIPs were obtained to evaluate the vascular anatomy. Carotid stenosis measurements (when applicable) are obtained utilizing NASCET criteria, using the distal internal carotid diameter as the denominator. Multiphase CT imaging of the brain was performed following IV bolus contrast injection. Subsequent parametric perfusion maps were calculated using RAPID software. CONTRAST:  125mL OMNIPAQUE IOHEXOL 350 MG/ML SOLN COMPARISON:  CT head 09/29/2020 FINDINGS: CTA NECK FINDINGS Aortic arch: Standard branching. Imaged portion shows no evidence of aneurysm or dissection. No significant stenosis of the major arch vessel origins. Right carotid system: Normal right carotid. Negative for atherosclerotic disease. Left carotid system: Negative left carotid. Negative for atherosclerotic disease or stenosis. Vertebral arteries: Normal vertebral arteries bilaterally. Skeleton: No acute skeletal abnormality. Other neck: Negative for mass or enlarged lymph node. Upper chest: Mild ground-glass density in the right upper lobe without focal infiltrate or mass. Review of the MIP images confirms the above findings CTA HEAD FINDINGS Anterior circulation: Normal anterior circulation. Negative for stenosis or large vessel occlusion. Posterior circulation: Normal posterior circulation. Negative for stenosis or large vessel occlusion. Venous sinuses: Normal venous enhancement. Anatomic variants: None Review of the MIP images confirms the above findings CT Brain Perfusion Findings: ASPECTS: 10 CBF (<30%) Volume: 65mL Perfusion (Tmax>6.0s) volume: 22mL Mismatch Volume: 31mL Infarction Location:Patchy decreased perfusion in the left frontal lobe and high right frontal lobe. IMPRESSION: Normal CT angiogram head neck. No significant atherosclerotic disease. No intracranial large vessel occlusion CT perfusion demonstrates small patchy areas of diminished perfusion left frontal and right frontal lobe of uncertain significance.  No fixed infarct. Electronically Signed   By: Franchot Gallo M.D.   On: 09/29/2020 18:55   DG Ankle 2 Views Right  Result Date: 09/30/2020 CLINICAL DATA:  Ankle pain EXAM: RIGHT ANKLE - 2 VIEW COMPARISON:  X-ray ankle 09/17/2019 FINDINGS: No evidence of fracture, dislocation, or joint effusion. Redemonstration of a sclerotic appearance of the navicular bone. Soft tissues are unremarkable. IMPRESSION: No acute displaced fracture or dislocation with redemonstration of a chronic sclerotic appearing navicular. As per previous x-ray, noncontrast CT or MRI of the right focal may be valuable for further evaluation if clinically indicated. Electronically Signed   By: Iven Finn M.D.   On: 09/30/2020 21:01   CT HEAD WO CONTRAST  Result Date: 10/02/2020 CLINICAL DATA:  Brain mass.  Preoperative planning. EXAM: CT HEAD WITHOUT CONTRAST TECHNIQUE: Contiguous axial images were obtained from the base of the skull through the vertex without intravenous contrast. COMPARISON:  MRI head 09/30/2020.  CT head 10/01/2020 FINDINGS: Brain: BrainLAB protocol for preoperative planning purposes. Large areas of edema in the left frontal white matter and left temporoparietal white matter appear unchanged from recent studies. Prior MRI demonstrated associated enhancing masses. Masses are difficult to see on unenhanced CT. There is mass-effect on the left cerebral hemisphere.  5 mm midline shift to the right similar to slightly improved from prior studies. No hydrocephalus identified.  No acute hemorrhage identified. Vascular: Negative for hyperdense vessel Skull: No focal lesion. Sinuses/Orbits: Mucosal edema paranasal sinuses. Bony thickening of the maxillary sinus bilaterally. Negative orbit Other: None IMPRESSION: Large areas of edema in the left frontal lobe and left temporoparietal lobe unchanged. Associated areas of enhancement noted on MRI. 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 No acute  hemorrhage. 5 mm midline shift to the right similar to slightly improved. Electronically Signed   By: Franchot Gallo M.D.   On: 10/02/2020 09:41   CT HEAD WO CONTRAST  Result Date: 10/01/2020 CLINICAL DATA:  Brain mass EXAM: CT HEAD WITHOUT CONTRAST TECHNIQUE: Contiguous axial images were obtained from the base of the skull through the vertex without intravenous contrast. COMPARISON:  Brain MRI 09/30/2020 FINDINGS: Brain: Large areas of vasogenic edema in the left temporal lobe and left frontal lobe are unchanged. There is rightward midline shift 6 mm. No hydrocephalus. No acute hemorrhage. Vascular: No hyperdense vessel or unexpected calcification. Skull: Normal. Negative for fracture or focal lesion. Sinuses/Orbits: Right frontal sinus opacification Other: None. IMPRESSION: Unchanged appearance of large areas of vasogenic edema in the left temporal lobe and left frontal lobe with 6 mm rightward midline shift. Electronically Signed   By: Ulyses Jarred M.D.   On: 10/01/2020 23:09   CT ANGIO NECK W OR WO CONTRAST  Result Date: 09/29/2020 CLINICAL DATA:  Acute neuro deficit, aphasia.  Right-sided deficit EXAM: CT ANGIOGRAPHY HEAD AND NECK CT PERFUSION BRAIN TECHNIQUE: Multidetector CT imaging of the head and neck was performed using the standard protocol during bolus administration of intravenous contrast. Multiplanar CT image reconstructions and MIPs were obtained to evaluate the vascular anatomy. Carotid stenosis measurements (when applicable) are obtained utilizing NASCET criteria, using the distal internal carotid diameter as the denominator. Multiphase CT imaging of the brain was performed following IV bolus contrast injection. Subsequent parametric perfusion maps were calculated using RAPID software. CONTRAST:  111mL OMNIPAQUE IOHEXOL 350 MG/ML SOLN COMPARISON:  CT head 09/29/2020 FINDINGS: CTA NECK FINDINGS Aortic arch: Standard branching. Imaged portion shows no evidence of aneurysm or dissection. No  significant stenosis of the major arch vessel origins. Right carotid system: Normal right carotid. Negative for atherosclerotic disease. Left carotid system: Negative left carotid. Negative for atherosclerotic disease or stenosis. Vertebral arteries: Normal vertebral arteries bilaterally. Skeleton: No acute skeletal abnormality. Other neck: Negative for mass or enlarged lymph node. Upper chest: Mild ground-glass density in the right upper lobe without focal infiltrate or mass. Review of the MIP images confirms the above findings CTA HEAD FINDINGS Anterior circulation: Normal anterior circulation. Negative for stenosis or large vessel occlusion. Posterior circulation: Normal posterior circulation. Negative for stenosis or large vessel occlusion. Venous sinuses: Normal venous enhancement. Anatomic variants: None Review of the MIP images confirms the above findings CT Brain Perfusion Findings: ASPECTS: 10 CBF (<30%) Volume: 34mL Perfusion (Tmax>6.0s) volume: 54mL Mismatch Volume: 23mL Infarction Location:Patchy decreased perfusion in the left frontal lobe and high right frontal lobe. IMPRESSION: Normal CT angiogram head neck. No significant atherosclerotic disease. No intracranial large vessel occlusion CT perfusion demonstrates small patchy areas of diminished perfusion left frontal and right frontal lobe of uncertain significance. No fixed infarct. Electronically Signed   By: Franchot Gallo M.D.   On: 09/29/2020 18:55   MR Brain W and Wo Contrast  Result Date: 09/30/2020 CLINICAL DATA:  Right-sided facial droop with aphasia. Left-sided  body weakness EXAM: MRI HEAD WITHOUT AND WITH CONTRAST TECHNIQUE: Multiplanar, multiecho pulse sequences of the brain and surrounding structures were obtained without and with intravenous contrast. CONTRAST:  87mL GADAVIST GADOBUTROL 1 MMOL/ML IV SOLN COMPARISON:  CT and CTA from yesterday FINDINGS: Brain: 2 ring-enhancing comma shaped brain lesions located along the gray-white  matter junction of the posterior left temporal lobe and lateral left frontal lobe, measuring up to 21 mm in maximal span at the posterior temporal lobe. No associated restricted diffusion to imply pyogenic abscess. No discontinuous enhancement to imply tumefactive demyelination. Both lesions have a very similar morphology with a fairly smoothly contoured rim and adjacent nodular thickening with infolding of the wall. There is extensive adjacent vasogenic edema with local mass effect and mild rightward midline shift. No acute infarct, hydrocephalus, or acute hemorrhage. Small presumed arachnoid cyst is seen at the right vertex with mild local mass effect. Vascular: Preserved flow voids and vascular enhancement Skull and upper cervical spine: Normal marrow signal. Sinuses/Orbits: Opacified right frontal sinus with proteinaceous features. 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 Electronically Signed   By: Monte Fantasia M.D.   On: 09/30/2020 05:32   MR PELVIS W WO CONTRAST  Result Date: 10/02/2020 CLINICAL DATA:  Cancer of unknown primary. EXAM: MRI ABDOMEN AND PELVIS WITHOUT AND WITH CONTRAST TECHNIQUE: Multiplanar multisequence MR imaging of the abdomen and pelvis was performed both before and after the administration of intravenous contrast. CONTRAST:  32mL GADAVIST GADOBUTROL 1 MMOL/ML IV SOLN COMPARISON:  CT chest, abdomen and pelvis of September 29, 2020 FINDINGS: COMBINED FINDINGS FOR BOTH MR ABDOMEN AND PELVIS Lower chest: Please see dedicated CT of the chest from April 18th. No effusion or consolidation. Limited assessment of the lung bases on MRI. Heart size is enlarged. Hepatobiliary: Marked decreased T2 signal within the hepatic  parenchyma in this patient who received a dose of Feraheme on 09/30/2020 which limits the evaluation in general. No visible lesion, biliary ductal dilation or pericholecystic stranding. Pancreas: Pancreas grossly normal without ductal dilation or sign of adjacent inflammation. Spleen: Spleen with marked reduction in signal on T2 weighted images, normal size. Adrenals/Urinary Tract: Adrenal glands are unremarkable. Mild fullness of collecting systems and ureters is similar to the previous CT evaluation with mild RIGHT hydronephrosis perhaps related to compression of the distal ureters secondary to mass in the pelvis. Smooth renal contours. No visible renal lesion. Stomach/Bowel: Limited assessment of the gastrointestinal tract on MRI confounded by presence of susceptibility artifact from gas and gross patient and respiratory motion. Vascular/Lymphatic: Vascular structures in the abdomen are patent. The IVC is engorged in the setting of RIGHT heart enlargement. No aneurysmal dilation of the abdominal aorta. No adenopathy visible in the abdomen or in the pelvis. Limited assessment due to motion artifact and pre-existing vascular signal related to [facility] administration. No pelvic adenopathy. Reproductive: Bulky enlargement of the uterus. Numerous uterine masses. Endometrium not assessed. Adjacent urinary bladder is collapsed due to mass effect. Distal RIGHT ureter is mildly dilated as is the distal LEFT ureter due to mass effect. Uterus measuring 16 x 9 x 11 cm. Heterogeneous mass in the lower uterine segment is the largest mass in the uterus measuring 10 x 10 x 9.6 cm. Other small masses with predominantly low T2 signal and variable enhancement  are noted. The dominant uterine mass displays enhancement from baseline signal with heterogeneous pattern. Examination limited by motion. Other: RIGHT paramidline pelvic floor/Peri coccygeal and perirectal cystic area measuring 5.3 x 3.3 cm with mildly thickened wall. Baseline  T1 signal, hypointense. Baseline T2 signal with thin rim of low signal and evidence of septation. It is incompletely imaged at its lower margin on post-contrast images in appears to be below the pubococcygeal muscle abutting the gluteal muscle and the sacrum and coccyx as well as the rectum, perhaps insinuated between pelvic floor musculature. No ascites. Musculoskeletal: Marked diminished T1 and T2 signal within visualized skeletal structures compatible with iron deposition in marrow spaces in this patient with recent exogenous iron administration. No visible lesion. IMPRESSION: 1. Bulky enlargement of the uterus with multiple uterine masses, the largest of which measures 10 x 10 x 9.6 cm. Given the size of the dominant lesion and the heterogeneity leiomyosarcoma is a differential consideration though no definitive findings of leiomyosarcoma are present. 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 2. RIGHT paramidline pelvic floor/Peri coccygeal and perirectal cystic area measuring 5.3 x 3.3 cm with mildly thickened wall. It is incompletely imaged at its lower margin on post-contrast images and appears to be below the pubococcygeal muscle and perhaps insinuated between pelvic floor musculature. This may represent a tail gut cyst. The possibility of complication by infection is considered given the mildly thickened wall with enhancement. No nodularity. 3. Mild fullness of the collecting systems and ureters is similar to the previous CT evaluation with mild RIGHT hydronephrosis perhaps related to compression of the distal ureters secondary to mass effect. 4. 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 This does limit assessment. 5. Cardiomegaly. Electronically Signed   By: Zetta Bills M.D.   On: 10/02/2020 08:26   MR ABDOMEN W WO CONTRAST  Result Date: 10/02/2020 CLINICAL DATA:  Cancer of unknown  primary. EXAM: MRI ABDOMEN AND PELVIS WITHOUT AND WITH CONTRAST TECHNIQUE: Multiplanar multisequence MR imaging of the abdomen and pelvis was performed both before and after the administration of intravenous contrast. CONTRAST:  57mL GADAVIST GADOBUTROL 1 MMOL/ML IV SOLN COMPARISON:  CT chest, abdomen and pelvis of September 29, 2020 FINDINGS: COMBINED FINDINGS FOR BOTH MR ABDOMEN AND PELVIS Lower chest: Please see dedicated CT of the chest from April 18th. No effusion or consolidation. Limited assessment of the lung bases on MRI. Heart size is enlarged. Hepatobiliary: Marked decreased T2 signal within the hepatic parenchyma in this patient who received a dose of Feraheme on 09/30/2020 which limits the evaluation in general. No visible lesion, biliary ductal dilation or pericholecystic stranding. Pancreas: Pancreas grossly normal without ductal dilation or sign of adjacent inflammation. Spleen: Spleen with marked reduction in signal on T2 weighted images, normal size. Adrenals/Urinary Tract: Adrenal glands are unremarkable. Mild fullness of collecting systems and ureters is similar to the previous CT evaluation with mild RIGHT hydronephrosis perhaps related to compression of the distal ureters secondary to mass in the pelvis. Smooth renal contours. No visible renal lesion. Stomach/Bowel: Limited assessment of the gastrointestinal tract on MRI confounded by presence of susceptibility artifact from gas and gross patient and respiratory motion. Vascular/Lymphatic: Vascular structures in the abdomen are patent. The IVC is engorged in the setting of RIGHT heart enlargement. No aneurysmal dilation of the abdominal aorta. No adenopathy visible in the abdomen or in the pelvis. Limited assessment due to motion artifact and pre-existing vascular signal related to [facility]  administration. No pelvic adenopathy. Reproductive: Bulky enlargement of the uterus. Numerous uterine masses. Endometrium not assessed. Adjacent urinary bladder  is collapsed due to mass effect. Distal RIGHT ureter is mildly dilated as is the distal LEFT ureter due to mass effect. Uterus measuring 16 x 9 x 11 cm. Heterogeneous mass in the lower uterine segment is the largest mass in the uterus measuring 10 x 10 x 9.6 cm. Other small masses with predominantly low T2 signal and variable enhancement are noted. The dominant uterine mass displays enhancement from baseline signal with heterogeneous pattern. Examination limited by motion. Other: RIGHT paramidline pelvic floor/Peri coccygeal and perirectal cystic area measuring 5.3 x 3.3 cm with mildly thickened wall. Baseline T1 signal, hypointense. Baseline T2 signal with thin rim of low signal and evidence of septation. It is incompletely imaged at its lower margin on post-contrast images in appears to be below the pubococcygeal muscle abutting the gluteal muscle and the sacrum and coccyx as well as the rectum, perhaps insinuated between pelvic floor musculature. No ascites. Musculoskeletal: Marked diminished T1 and T2 signal within visualized skeletal structures compatible with iron deposition in marrow spaces in this patient with recent exogenous iron administration. No visible lesion. IMPRESSION: 1. Bulky enlargement of the uterus with multiple uterine masses, the largest of which measures 10 x 10 x 9.6 cm. Given the size of the dominant lesion and the heterogeneity leiomyosarcoma is a differential consideration though no definitive findings of leiomyosarcoma are present. 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 2. RIGHT paramidline pelvic floor/Peri coccygeal and perirectal cystic area measuring 5.3 x 3.3 cm with mildly thickened wall. It is incompletely imaged at its lower margin on post-contrast images and appears to be below the pubococcygeal muscle and perhaps insinuated between pelvic floor musculature. This may represent a tail gut cyst. The possibility of complication by infection is considered  given the mildly thickened wall with enhancement. No nodularity. 3. Mild fullness of the collecting systems and ureters is similar to the previous CT evaluation with mild RIGHT hydronephrosis perhaps related to compression of the distal ureters secondary to mass effect. 4. 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 This does limit assessment. 5. Cardiomegaly. Electronically Signed   By: Zetta Bills M.D.   On: 10/02/2020 08:26   CT CHEST ABDOMEN PELVIS W CONTRAST  Result Date: 09/29/2020 CLINICAL DATA:  Intracranial mass, rule out primary lesion EXAM: CT CHEST, ABDOMEN, AND PELVIS WITH CONTRAST TECHNIQUE: Multidetector CT imaging of the chest, abdomen and pelvis was performed following the standard protocol during bolus administration of intravenous contrast. CONTRAST:  46mL OMNIPAQUE IOHEXOL 300 MG/ML  SOLN COMPARISON:  None. FINDINGS: CT CHEST FINDINGS Cardiovascular: Thoracic aorta and its branches are within normal limits. No aneurysmal dilatation is seen. The pulmonary artery as visualized is within normal limits. Heart is not significantly enlarged. Mediastinum/Nodes: Thoracic inlet is within normal limits. No hilar or mediastinal adenopathy is noted. The esophagus as visualized is within normal limits. Lungs/Pleura: The lungs are well aerated bilaterally. Mild dependent atelectatic changes are seen. No focal parenchymal nodule is noted. No sizable effusion or pneumothorax is noted. Musculoskeletal: Degenerative changes of the thoracic spine are noted. No rib abnormality is seen. No specific breast mass is noted. CT ABDOMEN PELVIS FINDINGS Hepatobiliary: No focal liver abnormality is seen. No gallstones, gallbladder wall thickening, or biliary dilatation. Pancreas: Unremarkable. No pancreatic ductal dilatation or surrounding inflammatory changes. Spleen: Normal in size without focal abnormality. Adrenals/Urinary  Tract: Adrenal glands are within normal limits. Kidneys demonstrate a normal enhancement pattern with normal excretion bilaterally. No obstructive changes are seen. Bladder is well distended with opacified urine consistent with prior contrast administration. Stomach/Bowel: No obstructive or inflammatory changes of the colon are noted. The appendix is well visualized and within normal limits. Small bowel and stomach appear within normal limits. Vascular/Lymphatic: No significant vascular findings are present. No enlarged abdominal or pelvic lymph nodes. Reproductive: Uterus is well visualized and demonstrates bulky enlargement with multifocal lesions. The largest of these measures at least 10 cm in dimension. These likely represent uterine fibroids although the possibility of a more aggressive lesion could not be totally excluded. Other: Adjacent to the rectum on the right there is a somewhat multiloculated cystic structure which measures approximately 4.6 by 3.0 cm. This is of uncertain etiology. Possibility of small perirectal abscess would deserve consideration. Alternatively this could represent a cystic metastatic lesion. Musculoskeletal: No acute bony abnormality is noted. IMPRESSION: CT of the chest: No acute intrathoracic abnormality is noted. CT of the abdomen and pelvis: Significantly enlarged uterus with multiple uterine lesions within. These may simply represent uterine fibroids although the possibility of a more aggressive process could not be excluded given the changes in the head on recent CT. MRI would be helpful on a nonemergent basis when the patient's condition has improved for further evaluation. Cystic structure adjacent to the rectum on the right of uncertain significance and etiology. This could also be evaluated with MRI of the pelvis. No other focal abnormality is noted. Electronically Signed   By: Inez Catalina M.D.   On: 09/29/2020 20:45   CT CEREBRAL PERFUSION W CONTRAST  Result Date:  09/29/2020 CLINICAL DATA:  Acute neuro deficit, aphasia.  Right-sided deficit EXAM: CT ANGIOGRAPHY HEAD AND NECK CT PERFUSION BRAIN TECHNIQUE: Multidetector CT imaging of the head and neck was performed using the standard protocol during bolus administration of intravenous contrast. Multiplanar CT image reconstructions and MIPs were obtained to evaluate the vascular anatomy. Carotid stenosis measurements (when applicable) are obtained utilizing NASCET criteria, using the distal internal carotid diameter as the denominator. Multiphase CT imaging of the brain was performed following IV bolus contrast injection. Subsequent parametric perfusion maps were calculated using RAPID software. CONTRAST:  156mL OMNIPAQUE IOHEXOL 350 MG/ML SOLN COMPARISON:  CT head 09/29/2020 FINDINGS: CTA NECK FINDINGS Aortic arch: Standard branching. Imaged portion shows no evidence of aneurysm or dissection. No significant stenosis of the major arch vessel origins. Right carotid system: Normal right carotid. Negative for atherosclerotic disease. Left carotid system: Negative left carotid. Negative for atherosclerotic disease or stenosis. Vertebral arteries: Normal vertebral arteries bilaterally. Skeleton: No acute skeletal abnormality. Other neck: Negative for mass or enlarged lymph node. Upper chest: Mild ground-glass density in the right upper lobe without focal infiltrate or mass. Review of the MIP images confirms the above findings CTA HEAD FINDINGS Anterior circulation: Normal anterior circulation. Negative for stenosis or large vessel occlusion. Posterior circulation: Normal posterior circulation. Negative for stenosis or large vessel occlusion. Venous sinuses: Normal venous enhancement. Anatomic variants: None Review of the MIP images confirms the above findings CT Brain Perfusion Findings: ASPECTS: 10 CBF (<30%) Volume: 39mL Perfusion (Tmax>6.0s) volume: 79mL Mismatch Volume: 52mL Infarction Location:Patchy decreased perfusion in the  left frontal lobe and high right frontal lobe. IMPRESSION: Normal CT angiogram head neck. No significant atherosclerotic disease. No intracranial large vessel occlusion CT perfusion demonstrates small patchy areas of diminished perfusion left frontal and right frontal lobe of uncertain significance.  No fixed infarct. Electronically Signed   By: Franchot Gallo M.D.   On: 09/29/2020 18:55   EEG adult  Result Date: 09/30/2020 Lora Havens, MD     09/30/2020  2:07 PM Patient Name: Candace Jones MRN: [id number] Epilepsy Attending: Lora Havens Referring Physician/Provider: Dr. Gean Birchwood Date: 09/30/2020 Duration: 23.02 mins Patient history: 40 year old female presented with sudden onset of aphasia right facial droop and right-sided weakness.  CT head showed possible left-sided brain mass with edema.  EEG to evaluate for seizures. Level of alertness: Awake AEDs during EEG study: Keppra Technical aspects: This EEG study was done with scalp electrodes positioned according to the 10-20 International system of electrode placement. Electrical activity was acquired at a sampling rate of 500Hz  and reviewed with a high frequency filter of 70Hz  and a low frequency filter of 1Hz . EEG data were recorded continuously and digitally stored. Description:  The posterior dominant rhythm consists of 9 Hz activity of moderate voltage (25-35 uV) seen predominantly in posterior head regions, symmetric and reactive to eye opening and eye closing. EEG also showed continuous rhythmic amplitude 2 to 3 Hz delta slowing in left temporal region. Additionally there is also continuous generalized polymorphic mixed frequencies with predominantly 5 to 8 Hz theta-alpha activity. Hyperventilation and photic stimulation were not performed.   ABNORMALITY - Lateralized rhythmic delta activity, left temporal region - Continuous slow, generalized IMPRESSION: This study is suggestive of cortical dysfunction in left temporal region likely  secondary underlying structural abnormality, ictal, postictal state.  Additionally there is evidence of moderate diffuse encephalopathy, nonspecific etiology.  No seizures or epileptiform discharges were seen throughout the recording. Priyanka Barbra Sarks    Overnight EEG with video  Result Date: 10/01/2020 Lora Havens, MD     10/02/2020  8:47 AM Patient Name: Candace Jones MRN: [id number] Epilepsy Attending: Lora Havens Referring Physician/Provider:  Myra Rude, NP Duration: 09/30/2020 1406 to 10/01/2020 1448  Patient history: 40 year old female presented with sudden onset of aphasia right facial droop and right-sided weakness.  CT head showed possible left-sided brain mass with edema.  EEG to evaluate for seizures.  Level of alertness: Awake, asleep  AEDs during EEG study: Keppra  Technical aspects: This EEG study was done with scalp electrodes positioned according to the 10-20 International system of electrode placement. Electrical activity was acquired at a sampling rate of 500Hz  and reviewed with a high frequency filter of 70Hz  and a low frequency filter of 1Hz . EEG data were recorded continuously and digitally stored.  Description:  The posterior dominant rhythm consists of 9 Hz activity of moderate voltage (25-35 uV) seen predominantly in posterior head regions, symmetric and reactive to eye opening and eye closing.   Sleep was characterized by vertex waves, sleep spindles (12 to 14 Hz), maximal frontocentral region.  EEG also showed near continuous rhythmic amplitude 2 to 3 Hz delta slowing in left temporal region. Additionally there is also continuous generalized polymorphic mixed frequencies with predominantly 5 to 8 Hz theta-alpha activity. Hyperventilation and photic stimulation were not performed.    ABNORMALITY - Lateralized rhythmic delta activity, left temporal region - Continuous slow, generalized  IMPRESSION: This study is suggestive of cortical dysfunction in left temporal  region likely secondary underlying structural abnormality, ictal, postictal state.  Additionally there is evidence of moderate diffuse encephalopathy, nonspecific etiology.  No seizures or epileptiform discharges were seen throughout the recording.  Lora Havens   ECHOCARDIOGRAM COMPLETE  Result Date: 10/03/2020    ECHOCARDIOGRAM  REPORT   Patient Name:   Candace Jones Date of Exam: 10/03/2020 Medical Rec #:  [id number]        Height:       68.1 in Accession #:    [id number]       Weight:       198.0 lb Date of Birth:  09-07-1980       BSA:          2.037 m Patient Age:    39 years         BP:           139/109 mmHg Patient Gender: F                HR:           44 bpm. Exam Location:  Inpatient Procedure: 2D Echo Indications:    abnormal ecg  History:        Patient has no prior history of Echocardiogram examinations.                 Brain tumor.  Sonographer:    Delcie Roch Referring Phys: [id number] AMRIT ADHIKARI IMPRESSIONS  1. Left ventricular ejection fraction, by estimation, is 55 to 60%. The left ventricle has normal function. The left ventricle has no regional wall motion abnormalities. Left ventricular diastolic parameters were normal.  2. Right ventricular systolic function is normal. The right ventricular size is normal.  3. The mitral valve is normal in structure. Trivial mitral valve regurgitation. No evidence of mitral stenosis.  4. The aortic valve is normal in structure. Aortic valve regurgitation is not visualized. No aortic stenosis is present.  5. The inferior vena cava is normal in size with greater than 50% respiratory variability, suggesting right atrial pressure of 3 mmHg. FINDINGS  Left Ventricle: Left ventricular ejection fraction, by estimation, is 55 to 60%. The left ventricle has normal function. The left ventricle has no regional wall motion abnormalities. The left ventricular internal cavity size was normal in size. There is  no left ventricular hypertrophy. Left  ventricular diastolic parameters were normal. Right Ventricle: The right ventricular size is normal. No increase in right ventricular wall thickness. Right ventricular systolic function is normal. Left Atrium: Left atrial size was normal in size. Right Atrium: Right atrial size was normal in size. Pericardium: There is no evidence of pericardial effusion. Mitral Valve: The mitral valve is normal in structure. Trivial mitral valve regurgitation. No evidence of mitral valve stenosis. Tricuspid Valve: The tricuspid valve is normal in structure. Tricuspid valve regurgitation is not demonstrated. No evidence of tricuspid stenosis. Aortic Valve: The aortic valve is normal in structure. Aortic valve regurgitation is not visualized. No aortic stenosis is present. Pulmonic Valve: The pulmonic valve was normal in structure. Pulmonic valve regurgitation is trivial. No evidence of pulmonic stenosis. Aorta: The aortic root is normal in size and structure. Venous: The inferior vena cava is normal in size with greater than 50% respiratory variability, suggesting right atrial pressure of 3 mmHg. IAS/Shunts: No atrial level shunt detected by color flow Doppler.  LEFT VENTRICLE PLAX 2D LVIDd:         4.90 cm  Diastology LVIDs:         3.10 cm  LV e' medial:    10.00 cm/s LV PW:         0.90 cm  LV E/e' medial:  11.2 LV IVS:        0.80 cm  LV e' lateral:  18.50 cm/s LVOT diam:     2.10 cm  LV E/e' lateral: 6.1 LV SV:         104 LV SV Index:   51 LVOT Area:     3.46 cm  RIGHT VENTRICLE             IVC RV S prime:     14.70 cm/s  IVC diam: 1.60 cm TAPSE (M-mode): 3.2 cm LEFT ATRIUM             Index       RIGHT ATRIUM           Index LA diam:        3.90 cm 1.91 cm/m  RA Area:     13.60 cm LA Vol (A2C):   58.6 ml 28.76 ml/m RA Volume:   28.90 ml  14.18 ml/m LA Vol (A4C):   46.5 ml 22.82 ml/m LA Biplane Vol: 56.8 ml 27.88 ml/m  AORTIC VALVE LVOT Vmax:   119.00 cm/s LVOT Vmean:  73.100 cm/s LVOT VTI:    0.299 m  AORTA Ao Root  diam: 2.60 cm Ao Asc diam:  3.10 cm MITRAL VALVE MV Area (PHT): 3.99 cm     SHUNTS MV Decel Time: 190 msec     Systemic VTI:  0.30 m MV E velocity: 112.00 cm/s  Systemic Diam: 2.10 cm MV A velocity: 57.50 cm/s MV E/A ratio:  1.95 Glori Bickers MD Electronically signed by Glori Bickers MD Signature Date/Time: 10/03/2020/4:35:51 PM    Final    CT IMAGE GUIDED DRAINAGE BY PERCUTANEOUS CATHETER  Result Date: 10/07/2020 INDICATION: 40 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 EXAM: CT GUIDED DRAINAGE OF PELVIC ABSCESS MEDICATIONS: The patient is currently admitted to the hospital and receiving intravenous antibiotics. The antibiotics were administered within an appropriate time frame prior to the initiation of the procedure. ANESTHESIA/SEDATION: 3 mg IV Versed 150 mcg IV Fentanyl Moderate Sedation Time:  18 minutes The patient was continuously monitored during the procedure by the interventional radiology nurse under my direct supervision. COMPLICATIONS: None immediate. TECHNIQUE: Informed written consent was obtained from the patient after a thorough discussion of the procedural risks, benefits and alternatives. All questions were addressed. Maximal Sterile Barrier Technique was utilized including caps, mask, sterile gowns, sterile gloves, sterile drape, hand hygiene and skin antiseptic. A timeout was performed prior to the initiation of the procedure. PROCEDURE: The posterior right pelvic skin was prepped with chlorhexidine in a sterile fashion, and a sterile drape was applied covering the operative field. A sterile gown and sterile gloves were used for the procedure. Local anesthesia was provided with 1% Lidocaine. Following local lidocaine administration, 19 gauge Yueh needle was inserted into the fluid collection. Purulent return was noted. The Yueh catheter was removed over 0.035 inch guidewire and tract dilation was performed with 10 and  12 [place] dilators. 10.2 [place] multipurpose pigtail drain was placed into the collection. Total of 30 mL of purulent material was aspirated. Samples were sent for Gram stain and culture. Drain secured to skin with suture and connected to bulb suction. IMPRESSION: 10.2 [place] multipurpose pigtail drain placed in perirectal abscess. Electronically Signed   By: Miachel Roux M.D.   On: 10/07/2020 15:03   CT HEAD CODE STROKE WO CONTRAST  Addendum Date: 09/29/2020   ADDENDUM REPORT: 09/29/2020 18:36 ADDENDUM: These results were called by telephone at the time of interpretation on 09/29/2020 at 6:36 pm to provider KRISTIE  HORTON , who verbally acknowledged these results. Code stroke imaging results were communicated on 09/29/2020 at 6:36 pm to provider Bhagat via text page Electronically Signed   By: Franchot Gallo M.D.   On: 09/29/2020 18:36   Result Date: 09/29/2020 CLINICAL DATA:  Code stroke. Acute neuro deficit. Right-sided deficit, aphasia EXAM: CT HEAD WITHOUT CONTRAST TECHNIQUE: Contiguous axial images were obtained from the base of the skull through the vertex without intravenous contrast. COMPARISON:  None. FINDINGS: Brain: Moderate amount of white matter edema is present in the left temporal lobe and left posterior frontal lobe with sparing of the cortex. There is mass-effect and 6 mm midline shift to the right. No edema on the right side. No hemorrhage. Ventricle size normal. Vascular: Negative for hyperdense vessel Skull: Negative Sinuses/Orbits: Mild mucosal edema paranasal sinuses. Negative orbit Other: None ASPECTS ([place] Stroke Program Early CT Score) - Ganglionic level infarction (caudate, lentiform nuclei, internal capsule, insula, M1-M3 cortex): 7 - Supraganglionic infarction (M4-M6 cortex): 3 Total score (0-10 with 10 being normal): 10 IMPRESSION: 1. Moderate amount of white matter edema in the left frontal and temporal lobe with sparing of cortex. Pattern is most likely due to tumor. Possible  metastatic disease or primary brain tumor. Recommend MRI brain without and with contrast 2. ASPECTS is 10 Electronically Signed: By: Franchot Gallo M.D. On: 09/29/2020 18:33    DISCHARGE EXAMINATION: Vitals:   10/09/20 2000 10/10/20 0000 10/10/20 0752 10/10/20 1131  BP: 103/75 103/62 103/69 103/75  Pulse: 75 71 62 72  Resp: 18 18 18 18   Temp: 97.8 F (36.6 C) (!) 97.4 F (36.3 C) (!) 97.5 F (36.4 C) (!) 97.5 F (36.4 C)  TempSrc: Oral Oral Oral Oral  SpO2: 100% 99% 100% 100%  Weight:      Height:       General appearance: Awake alert.  In no distress Resp: Clear to auscultation bilaterally.  Normal effort Cardio: S1-S2 is normal regular.  No S3-S4.  No rubs murmurs or bruit GI: Abdomen is soft.  Nontender nondistended.  Bowel sounds are present normal.  No masses organomegaly.  Drain catheter noted. Extremities: No edema.  Full range of motion of lower extremities. Neurologic: Alert and oriented x3.  No focal neurological deficits.    DISPOSITION: Home with sister  Discharge Instructions    Ambulatory referral to Neurology   Complete by: As directed    An appointment is requested in approximately: 4 weeks for seizure follow-up   Call MD for:  difficulty breathing, headache or visual disturbances   Complete by: As directed    Call MD for:  extreme fatigue   Complete by: As directed    Call MD for:  persistant dizziness or light-headedness   Complete by: As directed    Call MD for:  persistant nausea and vomiting   Complete by: As directed    Call MD for:  severe uncontrolled pain   Complete by: As directed    Call MD for:  temperature >100.4   Complete by: As directed    Diet general   Complete by: As directed    Discharge instructions   Complete by: As directed    Please be sure to show up for your follow-up appointments.  Take your medications as prescribed.  Seek attention if you have increasing pain at the drain site or if you develop fevers or nausea or  vomiting.  You were cared for by a hospitalist during your hospital stay. If you have any  questions about your discharge medications or the care you received while you were in the hospital after you are discharged, you can call the unit and asked to speak with the hospitalist on call if the hospitalist that took care of you is not available. Once you are discharged, your primary care physician will handle any further medical issues. Please note that NO REFILLS for any discharge medications will be authorized once you are discharged, as it is imperative that you return to your primary care physician (or establish a relationship with a primary care physician if you do not have one) for your aftercare needs so that they can reassess your need for medications and monitor your lab values. If you do not have a primary care physician, you can call [phone] for a physician referral.   Increase activity slowly   Complete by: As directed    No wound care   Complete by: As directed         Allergies as of 10/10/2020   No Known Allergies     Medication List    TAKE these medications   amoxicillin-clavulanate 875-125 MG tablet Commonly known as: AUGMENTIN Take 1 tablet by mouth every 12 (twelve) hours for 5 days.   docusate sodium 100 MG capsule Commonly known as: COLACE Take 1 capsule (100 mg total) by mouth 2 (two) times daily.   FeroSul 325 (65 FE) MG tablet Generic drug: ferrous sulfate Take 1 tablet (325 mg total) by mouth daily with breakfast.   leucovorin 25 MG tablet Commonly known as: WELLCOVORIN Take 1 tablet (25 mg total) by mouth daily.   levETIRAcetam 750 MG tablet Commonly known as: KEPPRA Take 1 tablet (750 mg total) by mouth 2 (two) times daily.   pantoprazole 40 MG tablet Commonly known as: PROTONIX Take 1 tablet (40 mg total) by mouth at bedtime.   pyrimethamine 25 MG tablet Commonly known as: DARAPRIM Take 3 tablets (75 mg total) by mouth daily with breakfast.    sulfaDIAZINE 500 MG tablet Take 3 tablets (1,500 mg total) by mouth 4 (four) times daily.         Follow-up [facility] Follow up on 10/17/2020.   Specialty: Infectious Diseases Why: Appointment with [name] at 9:00 am and Pharmacy. Please arrive 15 min before your appointment  Contact information: [facility], [place] [id number] [place]. Go on 11/20/2020.   Why: hospital f/u appointment for 2:10 with Freeman Caldron and request to establish primary care with clinic at this appointment- if you are unable to keep this appointment please call within 24 hr to change or cancel-please arrive 15 min prior to appointment Contact information: [place] [phone]       Newman Pies, MD. Call.   Specialty: Neurosurgery Why: as needed Contact information: [address] [place] [address] [phone]        Mir, Paula Libra, MD Follow up.   Specialties: Interventional Radiology, Diagnostic Radiology, Radiology Why: Follow up visit for CT scan and possible drain injection at the clinic in 1-2 week after discharge.  Our office will call you to sent up the appointment.  Contact information: [address] [place] [address] [phone]               TOTAL DISCHARGE TIME: 35  minutes  [employer] on [email or web address]  10/11/2020, 11:04 AM

## 2020-10-10 NOTE — Discharge Instructions (Addendum)
[place] Instruction and IR follow up   Outpatient follow up visit for CT scan and possible drain injection at the clinic. Our office will call you to set up the appointment in 1-2 week after discharge. Please call our clinic if you have any questions or concerns about the appointment.    Outpatient discharge instruction for the drain and dressing:    ? Patient to flush the drain with 5 cc normal saline daily.  ? Record output daily (subtract 5 cc that was used to flush the drain from the total daily output)  ? Dressing change as needed and at least once a week.          Toxoplasmosis Toxoplasmosis is an infection that is caused by the parasite Toxoplasma gondii. The parasite lives in animals and humans. Toxoplasmosis is most commonly spread through the stool (feces) of cats and through undercooked meat. What are the causes? This condition may be caused by:  Having contact with the feces of an infected cat and passing the parasite from hands to mouth. This can also happen by accidentally ingesting anything that came in contact with an infected cat's feces, such as drinking water or unwashed fruits and vegetables grown in a garden. ? House cats that live on cat food generally do not carry the parasite (are not infectious). ? Outdoor cats that eat birds, rodents, and other small animals that carry the parasite can be infected.  Eating undercooked meat from an infected animal. This can also occur by using knives, cutting boards, or other utensils that have had contact with undercooked or raw, contaminated meat.  Handling undercooked meat from an infected animal and passing the parasite from hands to mouth.  A pregnant mother passing the infection to her fetus.  Receiving a donated organ (organ transplantation) or donated blood (transfusion) from someone with the infection. What increases the risk? People with a weak disease-fighting system (immune system) have a higher  risk of developing toxoplasmosis, especially a more serious or life-threatening case of toxoplasmosis. A weak immune system may be caused by:  HIV or AIDS.  Organ or bone marrow transplants.  Cancer of the blood or bone marrow.  Medicines that reduce the strength of the immune system (immunosuppressive drugs).  Medicines to treat cancer (chemotherapy drugs). Babies who are born to mothers infected with the parasite are also more likely to develop toxoplasmosis. What are the signs or symptoms? Most people with this infection have no symptoms. Those who have symptoms usually feel like they have the flu. Symptoms can include:  Swollen lymph nodes.  Fever, chills, or sweats.  Headache.  Muscle and joint aches.  Tiredness (fatigue). Some people may have eye disease (ocular disease) related to toxoplasmosis. Symptoms of ocular disease can include:  Blurred vision.  Seeing spots.  Sensitivity to bright light. People with a weak immune system may also develop more serious and possibly life-threatening symptoms. These symptoms may include:  Seizures.  Confusion or memory loss.  Poor coordination.  Shortness of breath.  Chest pain.  Abnormal heartbeat.  Loss of consciousness. How is this diagnosed? This condition may be diagnosed based on:  Blood tests.  Tissue samples (biopsies).  Amniocentesis. This is a procedure that involves passing a tiny needle through a pregnant woman's abdomen into the uterus to collect amniotic fluid to be tested.  CT scan.  MRI.   How is this treated? Treatment depends on the severity of your symptoms and other medical conditions that you may  have. You may not need treatment, or your treatment may include:  Medicines to kill the parasite (anti-parasitics). You may need supplements to help protect bone marrow from the anti-parasitic medicines. If you are pregnant, your baby may need to take similar medicines and supplements after  birth.  Medicines to help your eyes or eyesight. People with poor immune function may require long-term or permanent treatment for toxoplasmosis. Follow these instructions at home:  Take over-the-counter and prescription medicines only as told by your health care provider. This includes any supplements.  Keep all follow-up visits as told by your health care provider. This is important. How is this prevented? Food and drinks  Wash your hands thoroughly with soap and warm water before and after preparing or cooking food.  Cook all meat thoroughly before eating it. ? Cook [employer] manager, such as chicken and [place], to 165F (74C) or higher. ? Cook non-poultry ground meat to 160F (71C) or higher. ? Cook whole cuts of non-poultry meat to 145F (63C) or higher.  Freezing meat is another way to kill the parasite. Freeze meat to 86F (-12C) or lower for 2 days.  Wash fruits and vegetables before eating them.  Wash kitchen utensils and cutting boards thoroughly after each use.  Do not drink unpasteurized milk.  Avoid drinking untreated water.   Precautions around cats  Avoid outdoor or stray cats.  If you have a cat: ? Keep your cat indoors. ? Feed your cat only store-bought foods, and maintain your cat's health. ? Always wash your hands after cleaning your cat's litter box. ? If you are pregnant, avoid cleaning your cat's litter box. Ask someone to do this for you. ? If you have a weak immune system, wear gloves and a face mask when cleaning a cat litter box, or have someone clean the litter box for you. Other safety measures  If you have a weak immune system, your health care provider may prescribe medicine to prevent toxoplasmosis.  Wear gloves when you handle soil or work in your garden. Wash your hands thoroughly after gardening.  Cover outdoor sandboxes. Contact a health care provider if:  You have a fever.  You have muscle aches or pains that last for several  weeks.  You feel like you have the flu or have swollen lymph nodes in your neck. Get help right away if:  You feel confused.  You have vision changes.  You have seizures.  You are pregnant and have any swollen glands or lymph nodes.  You have a headache that will not go away, even with medicine.  You have shortness of breath.  You have chest pain.  You lose consciousness or you faint. Summary  Toxoplasmosis is an infection that is caused by a parasite.  This condition may be caused by having contact with the stool (feces) of an infected cat, eating or handling undercooked meat, or receiving an organ or blood donation from an infected person. A pregnant woman may also pass the infection to her baby.  Most people with this condition do not have any symptoms. Those who have symptoms usually feel like they have the flu.  People with a weak disease-fighting system (immune system) have a higher risk of developing toxoplasmosis.  This condition is usually treated with medicines to kill the parasite (anti-parasitics), supplements to protect your bone marrow from the anti-parasitic medicines, and medicines to help your eyes. This information is not intended to replace advice given to you by your health care provider.  Make sure you discuss any questions you have with your health care provider. Document Revised: 05/21/2020 Document Reviewed: 08/18/2018 Elsevier Patient Education  2021 [place] (acquired immunodeficiency syndrome) occurs when your body's disease-fighting system (immune system) is badly damaged and no longer protects you from infections and other health problems. This condition is caused by HIV (human immunodeficiency virus) infection. Although your immune system is weakened when you are living with AIDS, your health care provider will give you instructions about how to stay as healthy as possible. How to manage lifestyle changes Managing  stress Techniques to manage stress include:  Meditation, muscle relaxation, and breathing exercises.  Talk therapy.  Joining a support group of other people who have AIDS.  Getting 7 to 8 hours of sleep each night.  Spending time on hobbies and activities you enjoy. Relationships  Talk to your state's health department about partner notification services. This free service can help find your sex partners who may have been exposed to AIDS and provide screening.  Protect your sex partners by telling them you have AIDS. Always use a condom during sex. This includes vaginal, oral, and anal sex.  Talk to your sex partners about taking medicine to prevent HIV and AIDS (pre-exposure prophylaxis).  Do not share needles or other equipment that is used for injecting, smoking, or snorting drugs. Use a clean, unused needle and syringe every time. Dispose of all needles and syringes after use. Safety and prevention  Wash your hands often with soap and warm water for at least 20 seconds. If soap and water are not available, use alcohol-based hand sanitizer. Wash your hands: ? After coming into contact with human stool (feces). ? After working in the garden or yard. ? After handling a pet. ? Before preparing or eating food. ? After using the bathroom.  Wash all fruits and vegetables before eating.  Always wash your utensils and cutting boards with soap and water after each use.  Always keep raw foods separate from other food. Use a separate cutting board for raw meat.  If you are around pets and other animals: ? Do not touch their feces. If you have a cat, ask someone else to clean the litter box. ? If your pet has diarrhea, ask someone else to take your pet to the vet for you. Ask the vet whether the diarrhea is caused by bacteria that is harmful to you. ? Make sure all your pets are up to date on their vaccines. ? Avoid touching reptiles, such as snakes, lizards, or turtles.  Drink  bottled water or filtered drinking water. Do not drink water from rivers, lakes, or ponds.  If you are planning a trip to another country, talk to your health care provider. While traveling, always drink bottled water and be careful about what foods you eat.   How to recognize changes in your condition AIDS is a lifelong (chronic) illness. You may show no signs or symptoms of illness when AIDS is well managed. Following appropriate medical treatment will lessen the risk of infection and improve the quality and length of your life. Signs that your condition is getting worse may include:  Fever.  Persistent diarrhea.  Skin problems.  Weight loss.  Shortness of breath or difficulty breathing.  Changes in vision or memory. Follow these instructions at home: Medicines  Take over-the-counter and prescription medicines only as told by your health care provider. Do not change the dose or the time  of day that you take the medicine without first getting approval from your health care provider. Taking medicines may help to prevent spreading HIV and AIDS to others.  Tell your health care provider about all over-the-counter or prescription medicines you are taking, including vitamins, dietary supplements, herbs, eye drops, creams, and ointments.  If you were prescribed an antibiotic medicine, take it as told by your health care provider. Do not stop taking the antibiotic even if you start to feel better.   Eating and drinking  Talk with your health care provider or nutritionist about the best diet for you. Based on your condition, you may need to eat more protein or carbohydrates.  Drink enough fluid to keep your urine pale yellow.  Follow instructions from your health care provider about avoiding certain foods while taking certain medicines.  Do not eat: ? Raw or undercooked fish, meat, or eggs. ? Raw (unpasteurized) milk, juices, honey, or cheese. ? Raw sprouts, such as alfalfa sprouts.    Activity  Exercise regularly. Try doing 30 minutes of moderate exercise 5 days a week.  Talk with your health care provider before starting a new exercise routine. Lifestyle  If you drink alcohol: ? Limit how much you have to:  0-1 drink a day for women.  0-2 drinks a day for men. ? Know how much alcohol is in your drink. In the [place] one drink equals one 12 oz bottle of beer (355 mL), one 5 oz glass of wine (148 mL), or one 1 oz glass of hard liquor (44 mL).  Do not use any products that contain nicotine or tobacco. These products include cigarettes, chewing tobacco, and vaping devices, such as e-cigarettes. If you need help quitting, ask your health care provider. General instructions  Make sure you are up to date on recommended vaccinations. Ask your health care provider what vaccines are recommended for you.  Keep all follow-up visits. This is important. Where to find more information  Centers for Disease Control and Prevention: FindingEternity.ca  [place].S. Department of Health and Human Services: BloggingAssistance.si Contact a health care provider if:  You miss a dose of medicine.  You have new side effects from your medicine. Get help right away if:  You have signs of an infection, such as a fever, chills, or sore throat.  You have shortness of breath.  You have chest pain.  You vomit multiple times.  You have diarrhea.  You have vision problems.  You have confusion.  You have problems controlling your muscles.  You have severe pain that does not get better. Summary  AIDS occurs when your body's immune system is badly damaged and no longer protects you from infections and other health problems.  Although your immune system is weakened when you are living with AIDS, your health care provider will give you instructions about how to stay as healthy as possible.  Contact a health care provider right away if you have any signs of an infection, such as a fever, chills, or  sore throat. This information is not intended to replace advice given to you by your health care provider. Make sure you discuss any questions you have with your health care provider. Document Revised: 12/05/2019 Document Reviewed: 12/05/2019 Elsevier Patient Education  [place].

## 2020-10-10 NOTE — Progress Notes (Signed)
[facility] for Infectious Disease  Date of Admission:  09/29/2020      Total days of antibiotics 8  Day 3 pyrimethamine , leucovorin, sulfadiazine   bactrim reduced to proph dose 4/27  Augmentin day 3          ASSESSMENT: Urvi Imes is a 40 y.o. 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 Also with unrelated perirectal abscess s/p IR percutaneous drain placement. Macular / papular appearing rash.   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 Continues to improve. Pharmacy has filled a week of her pill box for her to have at home. All medication access taken care of for 2 weeks until her ADAP is approved. I asked her and her sister to please bring all of her medications and pill boxes with her at her next appointment so we can continue supporting this for her.   HIV, AIDS+ = Appt next Friday arranged with me in clinic to follow up on treatment and consider starting ARVs at that time [facility] likely). Hep B sAg negative with seroprotection against hep A/B. RPR negative. STI screen and pap smear to be collected outpatient. G6PD normal. Hold on bactrim proph for now as she will have some coverage against PCP on sulfadiazine.   Uninsured = see above, ADAP pending  Perirectal abscess = IR drain in place, IR planning   Maculopapular hyperpigmented rash = likely HIV / AIDS related. Follow on ART. Refer to derm if does not improve or concern for alterative process.   Diarrhea= antibiotic associated. Reasonable to do symptomatic treatment with imodium to help slow down frequency for now.     PLAN: 1. Continue pyrimethamine 75 mg QD + sulfadiazine + leucovorin  2. Stop bactrim   3. Continue Augmentin x 7d until reassessment in clinic 4. IR drain clinic follow up outpatient 5. Ensure final read on micro from abscess matches #3   Appts Set:     Principal Problem:   AIDS with toxoplasmosis  ([facility]) Active Problems:   AIDS (acquired immune deficiency syndrome) (HCC)   Intracranial mass   Seizure (HCC)   Microcytic hypochromic anemia   Brain mass   Ankle pain   Uterine mass   Perirectal abscess   MRI contraindicated due to metal implant   . sodium chloride   Intravenous Once  . amoxicillin-clavulanate  1 tablet Oral Q12H  . Chlorhexidine Gluconate Cloth  6 each Topical Daily  . docusate sodium  100 mg Oral BID  . feeding supplement  237 mL Oral BID BM  . ferrous sulfate  325 mg Oral Q breakfast  . leucovorin  25 mg Oral Daily  . levETIRAcetam  750 mg Oral BID  . nystatin  5 mL Oral QID  . pantoprazole  40 mg Oral QHS  . pyrimethamine  75 mg Oral Q breakfast  . sodium chloride flush  5 mL Intracatheter Q8H  . sulfaDIAZINE  1,500 mg Oral QID    SUBJECTIVE: Doing well - feels better re: appetite today. Ready to go home.    Review of Systems: Review of Systems  Constitutional: Negative for chills and fever.  HENT: Negative for tinnitus.   Eyes: Negative for blurred vision and photophobia.  Respiratory: Negative for cough and sputum production.   Cardiovascular: Negative for chest pain.  Gastrointestinal: Positive for diarrhea. Negative for abdominal pain, nausea and vomiting.  Poor appetite   Genitourinary: Negative for dysuria.  Musculoskeletal: Negative for back pain, joint pain and myalgias.  Skin: Negative for rash.  Neurological: Negative for speech change, weakness and headaches.  Psychiatric/Behavioral: Negative for depression.     No Known Allergies    OBJECTIVE: Vitals:   10/09/20 2000 10/10/20 0000 10/10/20 0752 10/10/20 1131  BP: 103/75 103/62 103/69 103/75  Pulse: 75 71 62 72  Resp: 18 18 18 18   Temp: 97.8 F (36.6 C) (!) 97.4 F (36.3 C) (!) 97.5 F (36.4 C) (!) 97.5 F (36.4 C)  TempSrc: Oral Oral Oral Oral  SpO2: 100% 99% 100% 100%  Weight:      Height:       Body mass index is 30.1 kg/m.  Physical Exam Vitals reviewed.   Constitutional:      Appearance: She is well-developed.     Comments: Resting quietly in bed.   HENT:     Mouth/Throat:     Mouth: No oral lesions.     Dentition: Normal dentition. No dental abscesses.     Pharynx: No oropharyngeal exudate.  Cardiovascular:     Rate and Rhythm: Normal rate and regular rhythm.     Heart sounds: Normal heart sounds.  Pulmonary:     Effort: Pulmonary effort is normal.     Breath sounds: Normal breath sounds.  Abdominal:     General: There is no distension.     Palpations: Abdomen is soft.     Tenderness: There is no abdominal tenderness.  Genitourinary:    Comments: Scant thin red drainage today in bulb Lymphadenopathy:     Cervical: No cervical adenopathy.  Skin:    General: Skin is warm and dry.     Capillary Refill: Capillary refill takes less than 2 seconds.     Findings: No rash.  Neurological:     Mental Status: She is alert and oriented to person, place, and time.  Psychiatric:        Judgment: Judgment normal.     Comments: In good spirits today and engaged in care discussion      Lab Results Lab Results  Component Value Date   WBC 5.4 10/08/2020   HGB 9.0 (L) 10/08/2020   HGB 8.8 (L) 10/08/2020   HCT 31.0 (L) 10/08/2020   MCV 69.5 (L) 10/08/2020   PLT 190 10/08/2020    Lab Results  Component Value Date   CREATININE 0.71 10/08/2020   BUN 14 10/08/2020   NA 133 (L) 10/08/2020   K 3.8 10/08/2020   CL 103 10/08/2020   CO2 23 10/08/2020    Lab Results  Component Value Date   ALT 24 09/30/2020   AST 33 09/30/2020   ALKPHOS 60 09/30/2020   BILITOT 0.7 09/30/2020     Microbiology: Recent Results (from the past 240 hour(s))  Culture, fungus without smear     Status: None (Preliminary result)   Collection Time: 10/02/20 12:36 PM   Specimen: PATH Other; Tissue  Result Value Ref Range Status   Specimen Description BIOPSY  Final   Special Requests LEFT POSTERIOR BRAIN  Final   Culture   Final    NO FUNGUS ISOLATED  AFTER 8 DAYS Performed at [facility], [place] [address] [place], [place] [address]    Report Status PENDING  Incomplete  Acid Fast Smear (AFB)     Status: None   Collection Time: 10/02/20 12:36 PM   Specimen: PATH Other; Tissue  Result Value Ref  Range Status   AFB Specimen Processing Comment  Final    Comment: Tissue Grinding and Digestion/Decontamination   Acid Fast Smear Negative  Final    Comment: (NOTE) Performed At: [facility] [place], [place] [id number] Rush Farmer MD [id number]    Source (AFB) BIOPSY  Final    Comment: LEFT POSTERIOR BRAIN Performed at [facility], [place] [address], [place], [place] [address]   Aerobic/Anaerobic Culture (surgical/deep wound)     Status: None (Preliminary result)   Collection Time: 10/07/20  1:48 PM   Specimen: Abscess  Result Value Ref Range Status   Specimen Description ABSCESS  Final   Special Requests Immunocompromised  Final   Gram Stain   Final    FEW WBC PRESENT, PREDOMINANTLY PMN NO ORGANISMS SEEN    Culture   Final    NO GROWTH 2 DAYS NO ANAEROBES ISOLATED; CULTURE IN PROGRESS FOR 5 DAYS Performed at [facility] [address], [place], [place] [address]    Report Status PENDING  Incomplete  Culture, fungus without smear     Status: None (Preliminary result)   Collection Time: 10/07/20  3:14 PM   Specimen: Perirectal; Abscess  Result Value Ref Range Status   Specimen Description PERIRECTAL  Final   Special Requests Immunocompromised  Final   Culture   Final    NO FUNGUS ISOLATED AFTER 2 DAYS Performed at [facility], [address] [place], [place] [address]    Report Status PENDING  Incomplete     Janene Madeira, MSN, NP-C [facility] Infectious Disease [email or web address] Pager: [phone] Office: [phone] [place]: [phone]

## 2020-10-12 LAB — AEROBIC/ANAEROBIC CULTURE W GRAM STAIN (SURGICAL/DEEP WOUND): Culture: NO GROWTH

## 2020-10-13 ENCOUNTER — Emergency Department (HOSPITAL_COMMUNITY)
Admission: EM | Admit: 2020-10-13 | Discharge: 2020-10-13 | Disposition: A | Discharge status: Home / Self Care | Payer: Medicaid Other | Attending: Emergency Medicine | Admitting: Emergency Medicine

## 2020-10-13 ENCOUNTER — Other Ambulatory Visit: Payer: Self-pay

## 2020-10-13 ENCOUNTER — Other Ambulatory Visit: Payer: Self-pay | Admitting: Infectious Diseases

## 2020-10-13 ENCOUNTER — Encounter (HOSPITAL_COMMUNITY): Payer: Self-pay | Admitting: Emergency Medicine

## 2020-10-13 DIAGNOSIS — R112 Nausea with vomiting, unspecified: Secondary | ICD-10-CM | POA: Diagnosis present

## 2020-10-13 DIAGNOSIS — R1013 Epigastric pain: Secondary | ICD-10-CM | POA: Insufficient documentation

## 2020-10-13 DIAGNOSIS — R110 Nausea: Secondary | ICD-10-CM

## 2020-10-13 DIAGNOSIS — B20 Human immunodeficiency virus [HIV] disease: Secondary | ICD-10-CM | POA: Insufficient documentation

## 2020-10-13 DIAGNOSIS — K611 Rectal abscess: Secondary | ICD-10-CM

## 2020-10-13 LAB — COMPREHENSIVE METABOLIC PANEL
ALT: 40 U/L (ref 0–44)
AST: 34 U/L (ref 15–41)
Albumin: 3.5 g/dL (ref 3.5–5.0)
Alkaline Phosphatase: 56 U/L (ref 38–126)
Anion gap: 9 (ref 5–15)
BUN: 15 mg/dL (ref 6–20)
CO2: 23 mmol/L (ref 22–32)
Calcium: 8.8 mg/dL — ABNORMAL LOW (ref 8.9–10.3)
Chloride: 103 mmol/L (ref 98–111)
Creatinine, Ser: 1.62 mg/dL — ABNORMAL HIGH (ref 0.44–1.00)
GFR, Estimated: 41 mL/min — ABNORMAL LOW (ref >60)
Glucose, Bld: 93 mg/dL (ref 70–99)
Potassium: 3.8 mmol/L (ref 3.5–5.1)
Sodium: 135 mmol/L (ref 135–145)
Total Bilirubin: 0.3 mg/dL (ref 0.3–1.2)
Total Protein: 8.2 g/dL — ABNORMAL HIGH (ref 6.5–8.1)

## 2020-10-13 LAB — CBC WITH DIFFERENTIAL/PLATELET
Abs Immature Granulocytes: 0.11 10*3/uL — ABNORMAL HIGH (ref 0.00–0.07)
Basophils Absolute: 0 10*3/uL (ref 0.0–0.1)
Basophils Relative: 0 %
Eosinophils Absolute: 0 10*3/uL (ref 0.0–0.5)
Eosinophils Relative: 0 %
HCT: 31.8 % — ABNORMAL LOW (ref 36.0–46.0)
Hemoglobin: 9.2 g/dL — ABNORMAL LOW (ref 12.0–15.0)
Immature Granulocytes: 1 %
Lymphocytes Relative: 7 %
Lymphs Abs: 0.7 10*3/uL (ref 0.7–4.0)
MCH: 21.1 pg — ABNORMAL LOW (ref 26.0–34.0)
MCHC: 28.9 g/dL — ABNORMAL LOW (ref 30.0–36.0)
MCV: 72.9 fL — ABNORMAL LOW (ref 80.0–100.0)
Monocytes Absolute: 1.1 10*3/uL — ABNORMAL HIGH (ref 0.1–1.0)
Monocytes Relative: 12 %
Neutro Abs: 7.8 10*3/uL — ABNORMAL HIGH (ref 1.7–7.7)
Neutrophils Relative %: 80 %
Platelets: 169 10*3/uL (ref 150–400)
RBC: 4.36 MIL/uL (ref 3.87–5.11)
RDW: 33.9 % — ABNORMAL HIGH (ref 11.5–15.5)
WBC: 9.7 10*3/uL (ref 4.0–10.5)
nRBC: 0 % (ref 0.0–0.2)

## 2020-10-13 LAB — LIPASE, BLOOD: Lipase: 27 U/L (ref 11–51)

## 2020-10-13 MED ORDER — ONDANSETRON HCL 4 MG PO TABS: 4.0000 mg | ORAL_TABLET | Freq: Three times a day (TID) | ORAL | 0 refills | Status: DC | PRN

## 2020-10-13 MED ORDER — LIDOCAINE VISCOUS HCL 2 % MT SOLN
15.0000 mL | Freq: Once | OROMUCOSAL | Status: AC
  Administered 2020-10-13: 15 mL via ORAL
  Filled 2020-10-13: qty 15

## 2020-10-13 MED ORDER — ONDANSETRON 4 MG PO TBDP
4.0000 mg | ORAL_TABLET | Freq: Once | ORAL | Status: AC
Start: 2020-10-13 — End: 2020-10-13
  Administered 2020-10-13: 4 mg via ORAL
  Filled 2020-10-13: qty 1

## 2020-10-13 MED ORDER — ALUM & MAG HYDROXIDE-SIMETH 200-200-20 MG/5ML PO SUSP
30.0000 mL | Freq: Once | ORAL | Status: AC
  Administered 2020-10-13: 30 mL via ORAL
  Filled 2020-10-13: qty 30

## 2020-10-13 NOTE — ED Notes (Signed)
Pt called for VS x1, no response.

## 2020-10-13 NOTE — ED Provider Notes (Signed)
Emergency Medicine Provider Triage Evaluation Note  Candace Jones 39 y.o. female was evaluated in triage.  Pt complains of abd pain, nausea.  Patient reports that today after she took her medications, she started having abdominal pain, nausea.  She was just admitted to the hospital and had a pararectal abscess drained and a drain inserted.  She states it is still been draining.  No fevers.  She has been taking her medications.  No chest pain, difficulty breathing.  Language interpreter used.  Review of Systems  Positive: Abdominal pain, nausea Negative: Fever, chest pain, difficulty breathing.  Physical Exam  BP 134/82   Pulse 70   Temp 98.2 F (36.8 C) (Oral)   Resp 18   Ht 5\' 4"  (1.626 m)   Wt 65.8 kg   SpO2 100%   BMI 24.89 kg/m  Gen:   Awake, no distress   HEENT:  Atraumatic  Resp:  Normal effort  Cardiac:  Normal rate  Abd:   Slight abdominal distention which patient states is baseline.  Generalized tender Ness with no rigidity, guarding.  No CVA tenderness.  She has a drain noted to the posterior gluteus that is extending and draining serosanguineous fluid.  MSK:   Moves extremities without difficulty  Neuro:  Speech clear   Medical Decision Making  Medically screening exam initiated at 3:55 AM.  Appropriate orders placed.  Candace Jones was informed that the remainder of the evaluation will be completed by another provider, this initial triage assessment does not replace that evaluation, and the importance of remaining in the ED until their evaluation is complete.  Clinical Impression  abd pain, nausea   Portions of this note were generated with Dragon dictation software. Dictation errors may occur despite best attempts at proofreading.    Candace Napoleon, PA-C 10/13/20 1623    Candace Jones, Candace Critchley, DO 10/14/20 0010

## 2020-10-13 NOTE — ED Provider Notes (Signed)
[facility] EMERGENCY DEPARTMENT Provider Note   CSN: [id number] Arrival date & time: 10/13/20  1442     History Chief Complaint  Patient presents with  . Abdominal Pain  . Nausea  . Emesis    Candace Jones is a 40 y.o. female.  HPI   40 year old female presents the emergency room with concern for epigastric abdominal pain associated with nausea/vomiting.  Note patient recently had a perirectal abscess drained and there is still a drain in place.  She states that it is appropriately draining, denies any fevers abnormal swelling or any symptoms associated with this.  She states she developed epigastric abdominal pain associated with nausea and nonbloody emesis.  She was unable to tolerate her medications today.  Denies any diarrhea.  No chest pain or shortness of breath.  History reviewed. No pertinent past medical history.  Patient Active Problem List   Diagnosis Date Noted  . MRI contraindicated due to metal implant   . 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 Ankle pain   . AIDS (acquired immune deficiency syndrome) ([place])   . Uterine mass   . Perirectal abscess   . Brain mass 09/30/2020  . 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 Seizure ([place]) 09/29/2020  . Microcytic hypochromic anemia 09/29/2020    Past Surgical History:  Procedure Laterality Date  . APPLICATION OF CRANIAL NAVIGATION Left 10/02/2020   Procedure: APPLICATION OF CRANIAL NAVIGATION;  Surgeon: Newman Pies, MD;  Location: [facility];  Service: Neurosurgery;  Laterality: Left;  . CRANIOTOMY Left 10/02/2020   Procedure: LEFT CRANIOTOMY FOR TUMOR BIOPSY/ RESECTION with BrainLab;  Surgeon: Newman Pies, MD;  Location: [facility];  Service: Neurosurgery;  Laterality: Left;     OB History   No obstetric history on file.     Family History  Family history unknown: Yes    Social History   Tobacco Use  . Smoking status: Never Smoker  . Smokeless tobacco: Never Used  Substance Use Topics   . Alcohol use: Never  . Drug use: Never    Home Medications Prior to Admission medications   Medication Sig Start Date End Date Taking? Authorizing Provider  ondansetron (ZOFRAN) 4 MG tablet Take 1 tablet (4 mg total) by mouth every 8 (eight) hours as needed for nausea or vomiting. 10/13/20  Yes Shandi Godfrey, Alvin Critchley, DO  amoxicillin-clavulanate (AUGMENTIN) 875-125 MG tablet Take 1 tablet by mouth every 12 (twelve) hours for 5 days. 10/10/20 10/15/20  [place] Donnamarie Poag, DO  docusate sodium (COLACE) 100 MG capsule Take 1 capsule (100 mg total) by mouth 2 (two) times daily. 10/10/20   Bonnielee Haff, MD  ferrous sulfate 325 (65 FE) MG tablet Take 1 tablet (325 mg total) by mouth daily with breakfast. 10/10/20 01/08/21  [place] Donnamarie Poag, DO  leucovorin (WELLCOVORIN) 25 MG tablet Take 1 tablet (25 mg total) by mouth daily. 10/07/20 11/09/20  Shelly Coss, MD  levETIRAcetam (KEPPRA) 750 MG tablet Take 1 tablet (750 mg total) by mouth 2 (two) times daily. 10/10/20 01/08/21  [place] Donnamarie Poag, DO  pantoprazole (PROTONIX) 40 MG tablet Take 1 tablet (40 mg total) by mouth at bedtime. 10/10/20   Bonnielee Haff, MD  pyrimethamine (DARAPRIM) 25 MG tablet Take 3 tablets (75 mg total) by mouth daily with breakfast. 10/10/20 11/09/20  Bonnielee Haff, MD  sulfaDIAZINE 500 MG tablet Take 3 tablets (1,500 mg total) by mouth 4 (four) times daily. 10/07/20 11/09/20  Shelly Coss, MD    Allergies    Patient  has no known allergies.  Review of Systems   Review of Systems  Constitutional: Negative for chills and fever.  HENT: Negative for congestion.   Respiratory: Negative for shortness of breath.   Cardiovascular: Negative for chest pain.  Gastrointestinal: Positive for abdominal pain, nausea and vomiting. Negative for diarrhea.  Genitourinary: Negative for dysuria.  Skin: Negative for rash.  Neurological: Negative for headaches.    Physical Exam Updated Vital Signs BP 114/81   Pulse 77   Temp 98.3 F (36.8 C)  (Oral)   Resp 16   LMP 10/06/2020   SpO2 100%   Physical Exam Vitals and nursing note reviewed.  Constitutional:      Appearance: Normal appearance.  HENT:     Head: Normocephalic.     Mouth/Throat:     Mouth: Mucous membranes are moist.  Cardiovascular:     Rate and Rhythm: Normal rate.  Pulmonary:     Effort: Pulmonary effort is normal. No respiratory distress.  Abdominal:     Palpations: Abdomen is soft.     Tenderness: There is no abdominal tenderness.  Skin:    General: Skin is warm.  Neurological:     Mental Status: She is alert and oriented to person, place, and time. Mental status is at baseline.  Psychiatric:        Mood and Affect: Mood normal.     ED Results / Procedures / Treatments   Labs (all labs ordered are listed, but only abnormal results are displayed) Labs Reviewed  COMPREHENSIVE METABOLIC PANEL - Abnormal; Notable for the following components:      Result Value   Creatinine, Ser 1.62 (*)    Calcium 8.8 (*)    Total Protein 8.2 (*)    GFR, Estimated 41 (*)    All other components within normal limits  CBC WITH DIFFERENTIAL/PLATELET - Abnormal; Notable for the following components:   Hemoglobin 9.2 (*)    HCT 31.8 (*)    MCV 72.9 (*)    MCH 21.1 (*)    MCHC 28.9 (*)    RDW 33.9 (*)    Neutro Abs 7.8 (*)    Monocytes Absolute 1.1 (*)    Abs Immature Granulocytes 0.11 (*)    All other components within normal limits  LIPASE, BLOOD  URINALYSIS, ROUTINE W REFLEX MICROSCOPIC    EKG None  Radiology No results found.  Procedures Procedures   Medications Ordered in ED Medications  ondansetron (ZOFRAN-ODT) disintegrating tablet 4 mg (4 mg Oral Given 10/13/20 2330)  alum & mag hydroxide-simeth (MAALOX/MYLANTA) 200-200-20 MG/5ML suspension 30 mL (30 mLs Oral Given 10/13/20 2329)    And  lidocaine (XYLOCAINE) 2 % viscous mouth solution 15 mL (15 mLs Oral Given 10/13/20 2330)    ED Course  I have reviewed the triage vital signs and the nursing  notes.  Pertinent labs & imaging results that were available during my care of the patient were reviewed by me and considered in my medical decision making (see chart for details).    MDM Rules/Calculators/A&P                          40 year old female presents the emergency department with epigastric discomfort, nausea/vomiting.  Vitals are stable on arrival.  [place] interpreter used.  Abdomen is benign.  Blood work shows a mildly elevated creatinine at 1.62, this was previously normal about 5 days ago.  Otherwise belly labs was normal.  No leukocytosis, no fever  no signs of infection.  No complaints in regards to the abscess that was drained, drain hold serous fluid.  After Zofran and GI cocktail patient states that her symptoms have completely resolved.  She has been eating and drinking.  Discussed with the patient the importance of oral rehydration, she has demonstrated her the ability to drink fluids, she understands the importance of this.  Strict return to ED discussed.  Patient will be discharged and treated as an outpatient.  Discharge plan and strict return to ED precautions discussed, patient verbalizes understanding and agreement.  Final Clinical Impression(s) / ED Diagnoses Final diagnoses:  Nausea    Rx / DC Orders ED Discharge Orders         Ordered    ondansetron (ZOFRAN) 4 MG tablet  Every 8 hours PRN        10/13/20 2347           Shimshon Narula, Alvin Critchley, DO 10/13/20 2354

## 2020-10-13 NOTE — Discharge Instructions (Addendum)
You have been seen and discharged from the emergency department.  Take nausea medicine as prescribed.  Follow-up with your primary provider for reevaluation and further care. Take home medications as prescribed. If you have any worsening symptoms or further concerns for your health please return to an emergency department for further evaluation.

## 2020-10-13 NOTE — ED Triage Notes (Signed)
Translator/pt stated, pt. Having N/V abdominal pain. Started this morning-

## 2020-10-13 NOTE — ED Notes (Signed)
Pt d/c by MD and is provided w/ d/c instructions and follow up care  

## 2020-10-17 ENCOUNTER — Encounter: Payer: Self-pay | Admitting: Infectious Diseases

## 2020-10-17 ENCOUNTER — Other Ambulatory Visit: Payer: Self-pay | Admitting: Infectious Diseases

## 2020-10-17 ENCOUNTER — Other Ambulatory Visit: Payer: Self-pay

## 2020-10-17 ENCOUNTER — Ambulatory Visit (HOSPITAL_COMMUNITY)
Admission: RE | Admit: 2020-10-17 | Discharge: 2020-10-17 | Disposition: A | Discharge status: Home / Self Care | Payer: Medicaid Other | Source: Ambulatory Visit | Attending: Infectious Diseases | Admitting: Infectious Diseases

## 2020-10-17 ENCOUNTER — Ambulatory Visit (INDEPENDENT_AMBULATORY_CARE_PROVIDER_SITE_OTHER): Payer: Self-pay | Admitting: Infectious Diseases

## 2020-10-17 ENCOUNTER — Ambulatory Visit: Payer: Self-pay | Admitting: Pharmacist

## 2020-10-17 VITALS — BP 111/77 | HR 81 | Temp 97.9°F | Wt 162.0 lb

## 2020-10-17 DIAGNOSIS — K611 Rectal abscess: Secondary | ICD-10-CM | POA: Diagnosis not present

## 2020-10-17 DIAGNOSIS — B20 Human immunodeficiency virus [HIV] disease: Secondary | ICD-10-CM

## 2020-10-17 DIAGNOSIS — R110 Nausea: Secondary | ICD-10-CM

## 2020-10-17 DIAGNOSIS — B582 Toxoplasma meningoencephalitis: Secondary | ICD-10-CM

## 2020-10-17 DIAGNOSIS — Z5309 Procedure and treatment not carried out because of other contraindication: Secondary | ICD-10-CM

## 2020-10-17 DIAGNOSIS — R569 Unspecified convulsions: Secondary | ICD-10-CM

## 2020-10-17 DIAGNOSIS — R112 Nausea with vomiting, unspecified: Secondary | ICD-10-CM

## 2020-10-17 HISTORY — PX: IR RADIOLOGIST EVAL & MGMT: IMG5224

## 2020-10-17 MED ORDER — IOHEXOL 300 MG/ML  SOLN
100.0000 mL | Freq: Once | INTRAMUSCULAR | Status: AC | PRN
  Administered 2020-10-17: 80 mL via INTRAVENOUS

## 2020-10-17 MED ORDER — BIKTARVY 50-200-25 MG PO TABS: 1.0000 | ORAL_TABLET | Freq: Every day | ORAL | 5 refills | Status: DC

## 2020-10-17 MED ORDER — SODIUM CHLORIDE (PF) 0.9 % IJ SOLN
INTRAMUSCULAR | Status: AC
  Filled 2020-10-17: qty 50

## 2020-10-17 MED ORDER — ONDANSETRON HCL 4 MG PO TABS: 4.0000 mg | ORAL_TABLET | Freq: Three times a day (TID) | ORAL | 1 refills | Status: DC | PRN

## 2020-10-17 NOTE — Assessment & Plan Note (Signed)
Continue Keppra twice daily.  We will need to get her engaged with primary care.

## 2020-10-17 NOTE — Progress Notes (Signed)
Referring Physician(s): Dixon,Stephanie N  Supervising Physician: Aletta Edouard  Patient Status:  [facility]-outpatient  Chief Complaint: Peri-rectal abscess  Subjective: Candace Jones is a 40 y.o. 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 She presented to hosptial with stroke-like symptoms but was also found to have perirectal cystic fluid collection. She underwent successful peri-rectal drain placement and was discharged home in improved condition.  She is referred to [facility] today for urgent evaluation of her drain as evaluation at her ID office today revealed possible retraction.   Patient assessed in [facility] Radiology today following CT imaging.  She reports her drain has had minimal drainage for several days.  Denies fever, chills, nausea, vomiting.  The drain is uncomfortable due to position.  No new or concerning symptoms.   Allergies: Patient has no known allergies.  Medications: Prior to Admission medications   Medication Sig Start Date End Date Taking? Authorizing Provider  bictegravir-emtricitabine-tenofovir AF (BIKTARVY) 50-200-25 MG TABS tablet Take 1 tablet by mouth daily. 10/17/20   Alhambra Callas, NP  docusate sodium (COLACE) 100 MG capsule Take 1 capsule (100 mg total) by mouth 2 (two) times daily. 10/10/20   Bonnielee Haff, MD  ferrous sulfate 325 (65 FE) MG tablet Take 1 tablet (325 mg total) by mouth daily with breakfast. 10/10/20 01/08/21  [place] Donnamarie Poag, DO  leucovorin (WELLCOVORIN) 25 MG tablet Take 1 tablet (25 mg total) by mouth daily. 10/07/20 11/09/20  Shelly Coss, MD  levETIRAcetam (KEPPRA) 750 MG tablet Take 1 tablet (750 mg total) by mouth 2 (two) times daily. 10/10/20 01/08/21  [place] Eric J, DO  ondansetron (ZOFRAN) 4 MG tablet Take 1 tablet (4 mg total) by mouth every 8 (eight) hours as needed for nausea or vomiting. 10/17/20    Callas, NP  pantoprazole (PROTONIX) 40 MG tablet Take 1 tablet (40 mg total) by mouth at  bedtime. 10/10/20   Bonnielee Haff, MD  pyrimethamine (DARAPRIM) 25 MG tablet Take 3 tablets (75 mg total) by mouth daily with breakfast. 10/10/20 11/09/20  Bonnielee Haff, MD  sulfaDIAZINE 500 MG tablet Take 3 tablets (1,500 mg total) by mouth 4 (four) times daily. 10/07/20 11/09/20  Shelly Coss, MD     Vital Signs: LMP 10/06/2020   Physical Exam  NAD, alert Abdomen: TG drain in place on the right.  Insertion site intact, clean, and dry.  Trace amount of cloudy pink fluid in bulb.   Imaging: No results found.  Labs:  CBC: Recent Labs    10/05/20 0359 10/06/20 0754 10/08/20 0415 10/13/20 1622  WBC 4.6 4.4 5.4 9.7  HGB 7.7* 8.8* 9.0*  8.8* 9.2*  HCT 26.9* 30.5* 31.0* 31.8*  PLT 172 193 190 169    COAGS: Recent Labs    09/29/20 1814  INR 1.1  APTT 23*    BMP: Recent Labs    10/05/20 0359 10/06/20 0754 10/08/20 0415 10/13/20 1622  NA 135 134* 133* 135  K 3.1* 3.7 3.8 3.8  CL 105 105 103 103  CO2 24 24 23 23   GLUCOSE 83 105* 100* 93  BUN 11 8 14 15   CALCIUM 8.4* 9.0 8.9 8.8*  CREATININE 0.79 0.73 0.71 1.62*  GFRNONAA >60 >60 >60 41*    LIVER FUNCTION TESTS: Recent Labs    09/29/20 1814 09/30/20 0555 10/13/20 1622  BILITOT 0.6 0.7 0.3  AST 41 33 34  ALT 27 24 40  ALKPHOS 66 60 56  PROT 9.7* 9.0* 8.2*  ALBUMIN 3.6 3.2* 3.5    Assessment and Plan: Peri-rectal abscess Patient referred to [facility] today for assessment of her drain after several days of decreased output and concern for retraction at ID office today.  CT Pelvis obtained and reviewed by Dr. Kathlene Cote who notes resolve of the fluid collection. The drain is slightly retracted.  OK for removal.  Drain removed in its entirety by this PA without complication.  Patient tolerated well.  Dressing placed. Care instructions given.  No follow-up with IR needed at this time.   Electronically Signed: Docia Barrier, PA 10/17/2020, 2:00 PM   I spent a total of 25 Minutes at the the  patient's bedside AND on the patient's hospital floor or unit, greater than 50% of which was counseling/coordinating care for peri-rectal abscess.

## 2020-10-17 NOTE — Progress Notes (Signed)
Name: Candace Jones  DOB: [birth year] MRN: [id number] PCP: Patient, No Pcp Per (Inactive)    Brief Narrative:  Candace Jones is a 40 y.o. 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 Naive   Genotypes: . 09/2020 - hospitalized and not performed.   Subjective:   Chief Complaint  Patient presents with  . New Patient (Initial Visit)     HPI: [name] is here with her sister today for hospital follow up. In-person [place] interpreter is also present.  Hospital Hx:  [name] was brought to the emergency department on April 18 with complaints of aphasia, right facial droop, right-sided weakness and mild headache with abnormal uterine bleeding.  MRI of the brain revealed 2 cystic brain masses in the left cerebral hemisphere with extensive vasogenic edema and midline shift.  Neurosurgery was consulted started the patient on Keppra and took her for a biopsy given concern of a possible malignant process.  Pathology stains have returned positive for toxoplasmosis.  No metastatic cells were detected on path.  In this setting she was found to have reactive HIV antibody with CD4 count less than 35.   She was started on high-dose Bactrim until preferred regimen of sulfadiazine, pyrimethamine, leucovorin were available to her. Antiretroviral treatment was deferred until she completed 2-week induction for CNS toxoplasmosis.   She has been back to the emergency room since for nausea/heartburn.  She feels like this happens first thing in the morning and continues throughout the day.  She is had a poor appetite overall.  She has had 1 or 2 episodes of diarrhea but not every day.  She has completed her Augmentin antibiotic for perirectal abscess.  They are having trouble with the  drain and tell me that when they flush it it leaks all over the pad.  The bulb does not stay engaged.  She has completed the prescribed Augmentin which has helped her diarrhea.    Review of Systems  Constitutional: Negative for chills and fever.  HENT: Negative for tinnitus.   Eyes: Negative for blurred vision and photophobia.  Respiratory: Negative for cough and sputum production.   Cardiovascular: Negative for chest pain.  Gastrointestinal: Positive for heartburn, nausea and vomiting. Negative for diarrhea.  Genitourinary: Negative for dysuria.  Musculoskeletal: Negative for falls.  Skin: Negative for rash.  Neurological: Negative for dizziness, tingling, focal weakness, seizures, weakness and headaches.  Psychiatric/Behavioral: Negative for memory loss.    No past medical history on file.  Outpatient Medications Prior to Visit  Medication Sig Dispense Refill  . docusate sodium (COLACE) 100 MG capsule Take 1 capsule (100 mg total) by mouth 2 (two) times daily. 30 capsule 0  . ferrous sulfate 325 (65 FE) MG tablet Take 1 tablet (325 mg total) by mouth daily with breakfast. 90 tablet 0  . leucovorin (WELLCOVORIN) 25 MG tablet Take 1 tablet (25 mg total) by mouth daily. 30 tablet 0  . levETIRAcetam (KEPPRA) 750 MG tablet Take 1 tablet (750 mg total) by mouth 2 (two) times daily. 60 tablet 2  . pantoprazole (PROTONIX) 40 MG tablet Take 1 tablet (40 mg total) by mouth at bedtime. 30 tablet 0  . pyrimethamine (DARAPRIM) 25 MG tablet Take 3 tablets (75 mg  total) by mouth daily with breakfast. 90 tablet 0  . sulfaDIAZINE 500 MG tablet Take 3 tablets (1,500 mg total) by mouth 4 (four) times daily. 360 tablet 0  . ondansetron (ZOFRAN) 4 MG tablet Take 1 tablet (4 mg total) by mouth every 8 (eight) hours as needed for nausea or vomiting. 4 tablet 0   No facility-administered medications prior to visit.     No Known Allergies  Social History   Tobacco Use  . Smoking status: Never Smoker   . Smokeless tobacco: Never Used  Substance Use Topics  . Alcohol use: Never  . Drug use: Never    Family History  Family history unknown: Yes    Social History   Substance and Sexual Activity  Sexual Activity Yes     Objective:   Vitals:   10/17/20 0839  BP: 111/77  Pulse: 81  Temp: 97.9 F (36.6 C)  TempSrc: Oral  SpO2: 100%  Weight: 162 lb (73.5 kg)   Body mass index is 24.63 kg/m.  Physical Exam Vitals reviewed.  Constitutional:      Appearance: Normal appearance. She is not ill-appearing.  HENT:     Head:     Comments: Her left temporal surgical incision from brain biopsy is well-healed with scabs on the incision.  Incision is left open to air by this provider.    Mouth/Throat:     Mouth: Mucous membranes are moist.     Pharynx: Oropharynx is clear. No oropharyngeal exudate.  Eyes:     Extraocular Movements: Extraocular movements intact.     Pupils: Pupils are equal, round, and reactive to light.  Cardiovascular:     Rate and Rhythm: Normal rate and regular rhythm.     Pulses: Normal pulses.     Heart sounds: No murmur heard.   Pulmonary:     Effort: Pulmonary effort is normal.     Breath sounds: Normal breath sounds.  Abdominal:     General: Bowel sounds are normal. There is no distension.     Palpations: Abdomen is soft.     Tenderness: There is no abdominal tenderness.  Genitourinary:    Comments: Right gluteal JP drain examined -there is very scant pink-tinged fluid in the bulb.  When the drain is manipulated I can hear the suction release as one of the holes is right at the surface of the skin.  Based on the suture it looks like the drain has been withdrawn a bit.  The JP bulb will not stay engaged.  There is no drainage coming out around the insertion site from the drain. Musculoskeletal:     Cervical back: Normal range of motion and neck supple.  Skin:    General: Skin is warm and dry.     Capillary Refill: Capillary refill takes less than  2 seconds.     Findings: No rash.  Neurological:     General: No focal deficit present.     Mental Status: She is alert and oriented to person, place, and time.     Motor: No weakness (in general appears weakened).     Gait: Gait abnormal.  Psychiatric:        Mood and Affect: Mood normal.        Behavior: Behavior normal.      Lab Results Lab Results  Component Value Date   WBC 9.7 10/13/2020   HGB 9.2 (L) 10/13/2020   HCT 31.8 (L) 10/13/2020   MCV 72.9 (L) 10/13/2020   PLT  169 10/13/2020    Lab Results  Component Value Date   CREATININE 1.62 (H) 10/13/2020   BUN 15 10/13/2020   NA 135 10/13/2020   K 3.8 10/13/2020   CL 103 10/13/2020   CO2 23 10/13/2020    Lab Results  Component Value Date   ALT 40 10/13/2020   AST 34 10/13/2020   ALKPHOS 56 10/13/2020   BILITOT 0.3 10/13/2020    No results found for: CHOL, HDL, LDLCALC, LDLDIRECT, TRIG, CHOLHDL HIV 1 RNA Quant (copies/mL)  Date Value  09/30/2020 28,100     Assessment & Plan:   Problem List Items Addressed This Visit      High   AIDS (acquired immune deficiency syndrome) ([place]) - Primary    No other concerns for AIDS defining illness aside from known CNS toxoplasmosis. She is doing very well on her treatment and we can begin antiretrovirals today.  She was counseled on once daily Biktarvy.  I took care to separate this from the iron tablets in her pill tray.  She will come back in 1 week for reevaluation and pill tray refill.      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 Liese is doing very well on her complicated treatment for CNS toxoplasmosis.  She is only missed 1 dose of sulfadiazine over the last 1 week.  Fortunately she did bring her pill tray today and all of her medications. She has completed 2 weeks thustfar including bactrim induction.  I spent time to refill another weeks  worth of medications.  We discussed this in depth again as well as her new pill, Biktarvy, in her tray. At her next visit in 1 week we will again refill her pill tray and send in appropriate refills of her medications to last the remaining 3 weeks.       Relevant Medications   bictegravir-emtricitabine-tenofovir AF (BIKTARVY) 50-200-25 MG TABS tablet   Seizure ([facility])    Continue Keppra twice daily.  We will need to get her engaged with primary care.      Perirectal abscess    We called to speak with the drain clinic today to inform of the changes and request assistance with guidance on what to do going forward.  I feel like the drain is completely useless at this point given that it will not properly hold suction.  [facility] has graciously agreed to try to get the patient there today as there is no earlier appointments available at [facility] clinic until next week.  We will keep a look out for the CT results to ensure that her abscess has been adequately treated. Stop Augmentin, pending CT.       Nausea & vomiting    It is hard for me to differentiate whether she is having heartburn or nausea.  At her ER visit recently the Zofran that she was given did relieve her symptoms.  Unfortunately she was not able to pick this up as the medication was never E prescribed.  I will send her in an ample supply of Zofran tablets so she can tolerate her medications and diet better.      MRI contraindicated due to metal implant    Other Visit Diagnoses    Nausea       Relevant Medications   ondansetron (ZOFRAN) 4 MG tablet     I spent 40 minutes with  the patient in direct counseling of the above, exam, dressing care, setting up pill tray for 1 week, and coordinating care.   Janene Madeira, MSN, NP-C [facility] for Infectious [place] Pager: [phone] Office: [phone]  10/17/20  1:53 PM

## 2020-10-17 NOTE — Assessment & Plan Note (Addendum)
No other concerns for AIDS defining illness aside from known CNS toxoplasmosis. She is doing very well on her treatment and we can begin antiretrovirals today.  She was counseled on once daily Biktarvy.  I took care to separate this from the iron tablets in her pill tray.  She will come back in 1 week for reevaluation and pill tray refill.

## 2020-10-17 NOTE — Assessment & Plan Note (Signed)
It is hard for me to differentiate whether she is having heartburn or nausea.  At her ER visit recently the Zofran that she was given did relieve her symptoms.  Unfortunately she was not able to pick this up as the medication was never E prescribed.  I will send her in an ample supply of Zofran tablets so she can tolerate her medications and diet better.

## 2020-10-17 NOTE — Assessment & Plan Note (Signed)
We called to speak with the drain clinic today to inform of the changes and request assistance with guidance on what to do going forward.  I feel like the drain is completely useless at this point given that it will not properly hold suction.  [facility] has graciously agreed to try to get the patient there today as there is no earlier appointments available at [facility] clinic until next week.  We will keep a look out for the CT results to ensure that her abscess has been adequately treated. Stop Augmentin, pending CT.

## 2020-10-17 NOTE — Assessment & Plan Note (Signed)
Candace Jones is doing very well on her complicated treatment for CNS toxoplasmosis.  She is only missed 1 dose of sulfadiazine over the last 1 week.  Fortunately she did bring her pill tray today and all of her medications. She has completed 2 weeks thustfar including bactrim induction.  I spent time to refill another weeks worth of medications.  We discussed this in depth again as well as her new pill, Biktarvy, in her tray. At her next visit in 1 week we will again refill her pill tray and send in appropriate refills of her medications to last the remaining 3 weeks.

## 2020-10-17 NOTE — Patient Instructions (Addendum)
[facility] is going to try to get you in to their clinic today - keep your phone near you!  I refilled your pill box for another week.   You have 2 new prescriptions to pick up at the pharmacy -  1. Biktarvy  2. Candace Jones   Will see you in 1 week - please bring all your medications again to this appointment

## 2020-10-20 LAB — HIV GENOSURE(R) MG

## 2020-10-20 LAB — HIV-1 RNA, PCR (GRAPH) RFX/GENO EDI
HIV-1 RNA BY PCR: 57200 copies/mL
HIV-1 RNA Quant, Log: 4.757 log10copy/mL

## 2020-10-20 LAB — REFLEX TO GENOSURE(R) MG EDI: HIV GenoSure(R): 1

## 2020-10-22 ENCOUNTER — Other Ambulatory Visit: Payer: Self-pay

## 2020-10-22 ENCOUNTER — Inpatient Hospital Stay: Admission: RE | Admit: 2020-10-22 | Payer: Self-pay | Source: Ambulatory Visit

## 2020-10-23 LAB — CULTURE, FUNGUS WITHOUT SMEAR

## 2020-10-24 ENCOUNTER — Ambulatory Visit (INDEPENDENT_AMBULATORY_CARE_PROVIDER_SITE_OTHER): Payer: Self-pay | Admitting: Infectious Diseases

## 2020-10-24 ENCOUNTER — Other Ambulatory Visit: Payer: Self-pay

## 2020-10-24 ENCOUNTER — Encounter: Payer: Self-pay | Admitting: Infectious Diseases

## 2020-10-24 DIAGNOSIS — B20 Human immunodeficiency virus [HIV] disease: Secondary | ICD-10-CM

## 2020-10-24 DIAGNOSIS — K611 Rectal abscess: Secondary | ICD-10-CM

## 2020-10-24 DIAGNOSIS — R112 Nausea with vomiting, unspecified: Secondary | ICD-10-CM

## 2020-10-24 DIAGNOSIS — B582 Toxoplasma meningoencephalitis: Secondary | ICD-10-CM

## 2020-10-24 NOTE — Patient Instructions (Addendum)
Please see if you or your sister can pick up Maalox over the counter to help with the stomach pain can take it in the morning and up until 3 pm.  Continue to take the Ondansteron   It does interact with your new HIV medication we have for you at 5 pm, so please do not take the stomach medication after that.   Please come back to see our pharmacy team in 1 week to refill your pill box. You are about halfway through your treatment and doing a great job!

## 2020-10-24 NOTE — Progress Notes (Signed)
Name: Candace Jones  DOB: Oct 05, 1980 MRN: [id number] PCP: Patient, No Pcp Per (Inactive)    Brief Narrative:  Candace Jones is a 40 y.o. 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 Biktarvy 09/2020  Genotypes: . 09/2020 - hospitalized and not performed.   Subjective:   Chief Complaint  Patient presents with  . Follow-up    Reports abdominal and back pain that started this morning after taking meds; reports nausea and vomiting at night, severe head pain "often"; last week occurred twice     HPI: Candace Jones is here for follow up after starting on ARVs. [employer] [place] Interpretor used for the visit.  . She is having some significant abdominal pain and some episodes of vomiting and nausea. BMs are normal and not a problem for her to pass. The pain happens in the morning and through out the day. Does not describe it as nausea more sharp pain and abdominal discomfort. The medication she took at the ER recently was very helpful for her pain and wonders if she can continue taking this.   She has continue to do well with Toxo treatment and missed no doses. She has her pill tray here today and all her medications. She has made it through the first week of Biktarvy treatment and has noticed some light headaches across the forehead but nothing severe. No vision changes or concern for unilaterally neurologic symptoms / weakness.   Her gluteal drain has been removed and no concerns with previous site.    Review of Systems  Constitutional: Negative for chills and fever.  HENT: Negative for tinnitus.   Eyes: Negative for blurred vision and photophobia.  Respiratory: Negative for cough and sputum production.   Cardiovascular: Negative for chest pain.   Gastrointestinal: Positive for heartburn, nausea and vomiting. Negative for diarrhea.  Genitourinary: Negative for dysuria.  Musculoskeletal: Negative for falls.  Skin: Negative for rash.  Neurological: Negative for dizziness, tingling, focal weakness, seizures, weakness and headaches.  Psychiatric/Behavioral: Negative for memory loss.    Past Medical History:  Diagnosis Date  . AIDS (acquired immune deficiency syndrome) ([place])   . 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 bictegravir-emtricitabine-tenofovir AF (BIKTARVY) 50-200-25 MG TABS tablet Take 1 tablet by mouth daily. 30 tablet 5  . docusate sodium (COLACE) 100 MG capsule Take 1 capsule (100 mg total) by mouth 2 (two) times daily. 30 capsule 0  . ferrous sulfate 325 (65 FE) MG tablet Take 1 tablet (325 mg total) by mouth daily with breakfast. 90 tablet 0  . leucovorin (WELLCOVORIN) 25 MG tablet Take 1 tablet (25 mg total) by mouth daily. 30 tablet 0  . levETIRAcetam (KEPPRA) 750 MG tablet Take 1 tablet (750 mg total) by mouth 2 (two) times daily. 60 tablet 2  . ondansetron (ZOFRAN) 4 MG tablet Take 1 tablet (4 mg total) by mouth every 8 (eight) hours as needed for nausea or vomiting. 20 tablet 1  . pantoprazole (PROTONIX) 40 MG tablet Take 1 tablet (40 mg total) by mouth at bedtime. 30 tablet 0  . pyrimethamine (DARAPRIM) 25 MG tablet Take 3 tablets (75 mg total) by mouth daily with breakfast. 90  tablet 0  . sulfaDIAZINE 500 MG tablet Take 3 tablets (1,500 mg total) by mouth 4 (four) times daily. 360 tablet 0   No facility-administered medications prior to visit.     No Known Allergies  Social History   Tobacco Use  . Smoking status: Never Smoker  . Smokeless tobacco: Never Used  Substance Use Topics  . Alcohol use: Never  . Drug use: Never    Family History  Family history unknown: Yes    Social History   Substance and Sexual  Activity  Sexual Activity Yes     Objective:   Vitals:   10/24/20 0916  BP: 132/88  Pulse: 79  Weight: 163 lb (73.9 kg)   Body mass index is 24.78 kg/m.  Physical Exam Vitals reviewed.  Constitutional:      Appearance: She is well-developed.     Comments: Appears uncomfortable.   HENT:     Mouth/Throat:     Mouth: No oral lesions.     Dentition: Normal dentition. No dental abscesses.     Pharynx: No oropharyngeal exudate.  Cardiovascular:     Rate and Rhythm: Normal rate and regular rhythm.     Heart sounds: Normal heart sounds.  Pulmonary:     Effort: Pulmonary effort is normal.     Breath sounds: Normal breath sounds.  Abdominal:     General: There is no distension.     Palpations: Abdomen is soft.     Tenderness: There is abdominal tenderness.  Lymphadenopathy:     Cervical: No cervical adenopathy.  Skin:    General: Skin is warm and dry.     Capillary Refill: Capillary refill takes less than 2 seconds.     Findings: No rash.  Neurological:     Mental Status: She is alert and oriented to person, place, and time.  Psychiatric:        Judgment: Judgment normal.      Lab Results Lab Results  Component Value Date   WBC 9.7 10/13/2020   HGB 9.2 (L) 10/13/2020   HCT 31.8 (L) 10/13/2020   MCV 72.9 (L) 10/13/2020   PLT 169 10/13/2020    Lab Results  Component Value Date   CREATININE 1.62 (H) 10/13/2020   BUN 15 10/13/2020   NA 135 10/13/2020   K 3.8 10/13/2020   CL 103 10/13/2020   CO2 23 10/13/2020    Lab Results  Component Value Date   ALT 40 10/13/2020   AST 34 10/13/2020   ALKPHOS 56 10/13/2020   BILITOT 0.3 10/13/2020    No results found for: CHOL, HDL, LDLCALC, LDLDIRECT, TRIG, CHOLHDL HIV 1 RNA Quant (copies/mL)  Date Value  09/30/2020 28,100     Assessment & Plan:   Problem List Items Addressed This Visit      High   AIDS (acquired immune deficiency syndrome) ([place])    Doing well with Biktarvy after a week now. We delayed x 2  weeks to start treating toxo process first.  Having some mild headaches that are r/t new ARVs - we discussed this today and recommendation to take tylenol as needed to help with the pain. Check VL after 4weeks to monitor for therapeutic response.          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 Will continue for the planned 6 week course (started 4/27).  Tolerating initiation of ART well after 1 week without  concern for inflammatory reaction.  Another 1 week filled in pill box today.       Perirectal abscess    Resolved on CT scan s/p drain + antibiotics.       Nausea & vomiting    Discussed safe use of Maalox like products and can use until 3 pm (she doses her biktarvy at 5 pm) - if she has ongoing problems with pain into the evening maybe we can change the biktarvy timing to allow for Maalox. Continue zofran.   If unresolved or uncontrollable will check lipase next visit to ensure no drug induced pancreatitis r/t sulfadiazine, which I suspect is causing all her pain/GI complaints.          FU in 1 week with pharmacy team to help re-load her pill box given complexity of medical regimen and language barrier.   Janene Madeira, MSN, NP-C [facility] for Infectious [place] Pager: [phone] Office: [phone]  10/28/20  4:43 PM

## 2020-10-28 ENCOUNTER — Encounter: Payer: Self-pay | Admitting: Infectious Diseases

## 2020-10-28 NOTE — Assessment & Plan Note (Addendum)
Doing well with Biktarvy after a week now. We delayed x 2 weeks to start treating toxo process first.  Having some mild headaches that are r/t new ARVs - we discussed this today and recommendation to take tylenol as needed to help with the pain. Check VL after 4weeks to monitor for therapeutic response.

## 2020-10-28 NOTE — Assessment & Plan Note (Signed)
Resolved on CT scan s/p drain + antibiotics.

## 2020-10-28 NOTE — Assessment & Plan Note (Signed)
Discussed safe use of Maalox like products and can use until 3 pm (she doses her biktarvy at 5 pm) - if she has ongoing problems with pain into the evening maybe we can change the biktarvy timing to allow for Maalox. Continue zofran.   If unresolved or uncontrollable will check lipase next visit to ensure no drug induced pancreatitis r/t sulfadiazine, which I suspect is causing all her pain/GI complaints.

## 2020-10-28 NOTE — Assessment & Plan Note (Addendum)
Having some side effects we are trying to manage likely r/t sulfadiazine. Will continue for the planned 6 week course (started 4/27).  Tolerating initiation of ART well after 1 week without concern for inflammatory reaction.  Another 1 week filled in pill box today.

## 2020-10-29 LAB — CULTURE, FUNGUS WITHOUT SMEAR

## 2020-10-31 ENCOUNTER — Other Ambulatory Visit: Payer: Self-pay | Admitting: Infectious Diseases

## 2020-10-31 ENCOUNTER — Ambulatory Visit (INDEPENDENT_AMBULATORY_CARE_PROVIDER_SITE_OTHER): Payer: Self-pay | Admitting: Pharmacist

## 2020-10-31 ENCOUNTER — Other Ambulatory Visit: Payer: Self-pay

## 2020-10-31 DIAGNOSIS — R112 Nausea with vomiting, unspecified: Secondary | ICD-10-CM

## 2020-10-31 DIAGNOSIS — B582 Toxoplasma meningoencephalitis: Secondary | ICD-10-CM

## 2020-10-31 DIAGNOSIS — K2100 Gastro-esophageal reflux disease with esophagitis, without bleeding: Secondary | ICD-10-CM

## 2020-10-31 LAB — CBC WITH DIFFERENTIAL/PLATELET
Absolute Monocytes: 432 cells/uL (ref 200–950)
Basophils Absolute: 21 cells/uL (ref 0–200)
Basophils Relative: 0.8 %
Eosinophils Absolute: 101 cells/uL (ref 15–500)
Eosinophils Relative: 3.9 %
HCT: 26.1 % — ABNORMAL LOW (ref 35.0–45.0)
Hemoglobin: 7.8 g/dL — ABNORMAL LOW (ref 11.7–15.5)
Lymphs Abs: 582 cells/uL — ABNORMAL LOW (ref 850–3900)
MCH: 22.5 pg — ABNORMAL LOW (ref 27.0–33.0)
MCHC: 29.9 g/dL — ABNORMAL LOW (ref 32.0–36.0)
MCV: 75.4 fL — ABNORMAL LOW (ref 80.0–100.0)
Monocytes Relative: 16.6 %
Neutro Abs: 1464 cells/uL — ABNORMAL LOW (ref 1500–7800)
Neutrophils Relative %: 56.3 %
Platelets: 265 10*3/uL (ref 140–400)
RBC: 3.46 10*6/uL — ABNORMAL LOW (ref 3.80–5.10)
Total Lymphocyte: 22.4 %
WBC: 2.6 10*3/uL — ABNORMAL LOW (ref 3.8–10.8)

## 2020-10-31 LAB — COMPREHENSIVE METABOLIC PANEL
AG Ratio: 1 (calc) (ref 1.0–2.5)
ALT: 39 U/L — ABNORMAL HIGH (ref 6–29)
AST: 35 U/L — ABNORMAL HIGH (ref 10–30)
Albumin: 3.6 g/dL (ref 3.6–5.1)
Alkaline phosphatase (APISO): 69 U/L (ref 31–125)
BUN/Creatinine Ratio: 7 (calc) (ref 6–22)
BUN: 42 mg/dL — ABNORMAL HIGH (ref 7–25)
CO2: 20 mmol/L (ref 20–32)
Calcium: 8.8 mg/dL (ref 8.6–10.2)
Chloride: 103 mmol/L (ref 98–110)
Creat: 5.87 mg/dL — ABNORMAL HIGH (ref 0.50–1.10)
Globulin: 3.6 g/dL (calc) (ref 1.9–3.7)
Glucose, Bld: 84 mg/dL (ref 65–99)
Potassium: 5.1 mmol/L (ref 3.5–5.3)
Sodium: 134 mmol/L — ABNORMAL LOW (ref 135–146)
Total Bilirubin: 0.3 mg/dL (ref 0.2–1.2)
Total Protein: 7.2 g/dL (ref 6.1–8.1)

## 2020-10-31 LAB — AMYLASE: Amylase: 126 U/L — ABNORMAL HIGH (ref 21–101)

## 2020-10-31 LAB — LIPASE: Lipase: 21 U/L (ref 7–60)

## 2020-10-31 MED ORDER — PANTOPRAZOLE SODIUM 40 MG PO TBEC: 40.0000 mg | DELAYED_RELEASE_TABLET | Freq: Two times a day (BID) | ORAL | 0 refills | Status: DC

## 2020-10-31 MED ORDER — LEVETIRACETAM 750 MG PO TABS: 750.0000 mg | ORAL_TABLET | Freq: Two times a day (BID) | ORAL | 2 refills | Status: DC

## 2020-10-31 MED ORDER — LEUCOVORIN CALCIUM 25 MG PO TABS: 25.0000 mg | ORAL_TABLET | Freq: Every day | ORAL | 0 refills | Status: DC

## 2020-10-31 MED ORDER — SULFADIAZINE 500 MG PO TABS: 1500.0000 mg | ORAL_TABLET | Freq: Four times a day (QID) | ORAL | 0 refills | Status: DC

## 2020-10-31 MED ORDER — PYRIMETHAMINE 25 MG PO TABS: 75.0000 mg | ORAL_TABLET | Freq: Every day | ORAL | 0 refills | Status: DC

## 2020-10-31 MED ORDER — PROMETHAZINE HCL 12.5 MG PO TABS
12.5000 mg | ORAL_TABLET | Freq: Four times a day (QID) | ORAL | 1 refills | Status: DC | PRN
Start: 2020-10-31 — End: 2021-01-07

## 2020-10-31 NOTE — Progress Notes (Signed)
HPI: Candace Jones is a 40 y.o. female who presents to the [facility] for HIV follow-up.  Patient Active Problem List   Diagnosis Date Noted  . AKI (acute kidney injury) ([place]) 11/05/2020  . Hyperkalemia 11/05/2020  . Nausea & vomiting 10/17/2020  . MRI contraindicated due to metal implant   . 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 AIDS (acquired immune deficiency syndrome) ([place])   . Uterine mass   . Perirectal abscess   . Seizure ([place]) 09/29/2020  . Microcytic hypochromic anemia 09/29/2020    Patient's Medications  New Prescriptions   PROMETHAZINE (PHENERGAN) 12.5 MG TABLET    Take 1 tablet (12.5 mg total) by mouth every 6 (six) hours as needed for nausea or vomiting.  Previous Medications   BICTEGRAVIR-EMTRICITABINE-TENOFOVIR AF (BIKTARVY) 50-200-25 MG TABS TABLET    Take 1 tablet by mouth daily.   DOCUSATE SODIUM (COLACE) 100 MG CAPSULE    Take 1 capsule (100 mg total) by mouth 2 (two) times daily.   FERROUS SULFATE 325 (65 FE) MG TABLET    Take 1 tablet (325 mg total) by mouth daily with breakfast.   IBUPROFEN (ADVIL) 200 MG TABLET    Take 400 mg by mouth every 6 (six) hours as needed for headache or moderate pain.   LEUCOVORIN (WELLCOVORIN) 25 MG TABLET    Take 25 mg by mouth daily.  Modified Medications   Modified Medication Previous Medication   LEVETIRACETAM (KEPPRA) 750 MG TABLET levETIRAcetam (KEPPRA) 750 MG tablet      Take 1 tablet (750 mg total) by mouth 2 (two) times daily.    Take 1 tablet (750 mg total) by mouth 2 (two) times daily.   PANTOPRAZOLE (PROTONIX) 40 MG TABLET pantoprazole (PROTONIX) 40 MG tablet      Take 1 tablet (40 mg total) by mouth 2 (two) times daily.    Take 1 tablet (40 mg total) by mouth at bedtime.   PYRIMETHAMINE (DARAPRIM) 25 MG TABLET pyrimethamine (DARAPRIM) 25 MG tablet      Take 3 tablets (75 mg total) by mouth daily with breakfast for 12 days.    Take 3 tablets (75 mg total) by mouth daily with breakfast.    SULFADIAZINE 500 MG TABLET sulfaDIAZINE 500 MG tablet      Take 3 tablets (1,500 mg total) by mouth 4 (four) times daily for 12 days.    Take 3 tablets (1,500 mg total) by mouth 4 (four) times daily.  Discontinued Medications   No medications on file    Allergies: No Known Allergies  Past Medical History: Past Medical History:  Diagnosis Date  . AIDS (acquired immune deficiency syndrome) ([place])   . 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 Marital status: Single    Spouse name: Not on file  . Number of children: Not on file  . Years of education: Not on file  . Highest education level: Not on file  Occupational History  . Not on file  Tobacco Use  . Smoking status: Never Smoker  . Smokeless tobacco: Never Used  Substance and Sexual Activity  . Alcohol use: Never  . Drug use: Never  . Sexual activity: Yes  Other Topics Concern  . Not on file  Social History Narrative  . Not on file   Social Determinants of Health   Financial Resource Strain: Not on file  Food Insecurity: Not on file  Transportation Needs: Not on file  Physical Activity: Not on file  Stress: Not on file  Social Connections: Not on file    Labs: Lab Results  Component Value Date   HIV1RNAQUANT 28,100 09/30/2020    RPR and STI Lab Results  Component Value Date   LABRPR Reactive (A) 09/30/2020    No flowsheet data found.  Hepatitis B Lab Results  Component Value Date   HEPBSAG NON REACTIVE 10/08/2020   HEPBCAB NON REACTIVE 10/08/2020   Hepatitis C No results found for: HEPCAB, HCVRNAPCRQN Hepatitis A Lab Results  Component Value Date   HAV Reactive (A) 10/08/2020   Lipids: No results found for: CHOL, TRIG, HDL, CHOLHDL, VLDL, LDLCALC  Current HIV Regimen: Biktarvy  Assessment: Candace Jones comes in today for a pill box refill. [place] [phone] [place] interpreter used for visit.  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 She is on her 4th week and is set to finish induction therapy on 6/3. She brings all of her medication with her to the visit.  She has been 100% adherent to her medications this past week but is still having several issues. Today, she is complaining of continued belly pain, vomiting, constipation, and mild headache. She also states that her menstrual cycle bleeding is much worse than usual. She has no pain on palpitation on her abdomen. She is also still experiencing heartburn.  The headaches could be the Biktarvy or her menstrual cycle. It could also be from the vomiting. Asked her to take OTC ibuprofen for relief (400 mg BID as needed). Will up her Colace back to twice daily (she went to once daily before) for her constipation. With her abdominal pain and vomiting, I am a little worried about possible pancreatitis and/or liver toxicity with sulfadiazine. Will check amylase, lipase, and CMET today to make sure.   She needs refills on her medication, so will make sure she gets those before next week's refill on Friday. I refilled her pill box with Biktarvy (5pm dose to help with drug interaction with her iron and maalox), Colace, iron, keppra, pyrimethamine, leucovorin, sulfadiazine, and protonix. She will see me again next week.  Plan: - Filled pill box - Increased Colace to twice daily - Ibuprofen as needed for headache relief - Amylase, lipase, CMET today - F/u next Friday at [facility]. Tou Hayner, PharmD, BCIDP, AAHIVP, CPP Clinical Pharmacist Practitioner Infectious Diseases [facility] for Infectious Disease 11/06/2020, 8:39 AM

## 2020-10-31 NOTE — Progress Notes (Signed)
D/W Cassie who saw the patient today for pill refill.   Nausea and vomiting refractory to zofran. Will D/C this and try phenergan tablet every 6 hours.  Increase pantoprazole to BID to help with reflux.  No abdominal pain but with risk for drug induced pancreatitis will check LFTs and amylase/lipase.   Refills sent in for her Toxo medications for 12 more days to complete the 6 weeks of recommended treatment.   She is also concerned about large bloody clots with menses that is new and abnormal for her.  Pelvic MRI recently with  1. Bulky enlargement of the uterus with multiple uterine masses, the largest of which measures 10 x 10 x 9.6 cm. Given the size of the dominant lesion and the heterogeneity leiomyosarcoma is a differential consideration though no definitive findings of leiomyosarcoma are present. 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  Will refer to gynecology for assistance.   Janene Madeira, MSN, NP-C [facility] for Infectious Disease [email or web address] Pager: [phone] Office: [phone] [place]: [phone]

## 2020-11-05 ENCOUNTER — Emergency Department (HOSPITAL_COMMUNITY): Payer: Medicaid Other

## 2020-11-05 ENCOUNTER — Inpatient Hospital Stay (HOSPITAL_COMMUNITY)
Admission: EM | Admit: 2020-11-05 | Discharge: 2020-11-15 | DRG: 682 | Disposition: A | Discharge status: Home Health | Payer: Medicaid Other | Attending: Internal Medicine | Admitting: Internal Medicine

## 2020-11-05 ENCOUNTER — Encounter (HOSPITAL_COMMUNITY): Payer: Self-pay

## 2020-11-05 ENCOUNTER — Other Ambulatory Visit: Payer: Self-pay

## 2020-11-05 ENCOUNTER — Telehealth: Payer: Self-pay

## 2020-11-05 ENCOUNTER — Telehealth: Payer: Self-pay | Admitting: Infectious Diseases

## 2020-11-05 DIAGNOSIS — D509 Iron deficiency anemia, unspecified: Secondary | ICD-10-CM | POA: Diagnosis present

## 2020-11-05 DIAGNOSIS — D619 Aplastic anemia, unspecified: Secondary | ICD-10-CM | POA: Diagnosis present

## 2020-11-05 DIAGNOSIS — E875 Hyperkalemia: Secondary | ICD-10-CM | POA: Diagnosis present

## 2020-11-05 DIAGNOSIS — K30 Functional dyspepsia: Secondary | ICD-10-CM | POA: Diagnosis present

## 2020-11-05 DIAGNOSIS — Z21 Asymptomatic human immunodeficiency virus [HIV] infection status: Secondary | ICD-10-CM | POA: Diagnosis present

## 2020-11-05 DIAGNOSIS — N1330 Unspecified hydronephrosis: Secondary | ICD-10-CM

## 2020-11-05 DIAGNOSIS — B582 Toxoplasma meningoencephalitis: Secondary | ICD-10-CM | POA: Diagnosis present

## 2020-11-05 DIAGNOSIS — Z597 Insufficient social insurance and welfare support: Secondary | ICD-10-CM

## 2020-11-05 DIAGNOSIS — N131 Hydronephrosis with ureteral stricture, not elsewhere classified: Secondary | ICD-10-CM | POA: Diagnosis present

## 2020-11-05 DIAGNOSIS — N139 Obstructive and reflux uropathy, unspecified: Secondary | ICD-10-CM

## 2020-11-05 DIAGNOSIS — R112 Nausea with vomiting, unspecified: Secondary | ICD-10-CM

## 2020-11-05 DIAGNOSIS — D638 Anemia in other chronic diseases classified elsewhere: Secondary | ICD-10-CM | POA: Diagnosis present

## 2020-11-05 DIAGNOSIS — H538 Other visual disturbances: Secondary | ICD-10-CM | POA: Diagnosis not present

## 2020-11-05 DIAGNOSIS — N179 Acute kidney failure, unspecified: Principal | ICD-10-CM | POA: Diagnosis present

## 2020-11-05 DIAGNOSIS — D62 Acute posthemorrhagic anemia: Secondary | ICD-10-CM | POA: Diagnosis present

## 2020-11-05 DIAGNOSIS — G44039 Episodic paroxysmal hemicrania, not intractable: Secondary | ICD-10-CM

## 2020-11-05 DIAGNOSIS — G40909 Epilepsy, unspecified, not intractable, without status epilepticus: Secondary | ICD-10-CM | POA: Diagnosis present

## 2020-11-05 DIAGNOSIS — Z5181 Encounter for therapeutic drug level monitoring: Secondary | ICD-10-CM | POA: Diagnosis present

## 2020-11-05 DIAGNOSIS — R0602 Shortness of breath: Secondary | ICD-10-CM

## 2020-11-05 DIAGNOSIS — Z20822 Contact with and (suspected) exposure to covid-19: Secondary | ICD-10-CM | POA: Diagnosis present

## 2020-11-05 DIAGNOSIS — D259 Leiomyoma of uterus, unspecified: Secondary | ICD-10-CM | POA: Diagnosis present

## 2020-11-05 DIAGNOSIS — N920 Excessive and frequent menstruation with regular cycle: Secondary | ICD-10-CM | POA: Diagnosis present

## 2020-11-05 DIAGNOSIS — R12 Heartburn: Secondary | ICD-10-CM

## 2020-11-05 DIAGNOSIS — R310 Gross hematuria: Secondary | ICD-10-CM | POA: Diagnosis present

## 2020-11-05 DIAGNOSIS — B20 Human immunodeficiency virus [HIV] disease: Secondary | ICD-10-CM | POA: Diagnosis present

## 2020-11-05 DIAGNOSIS — N739 Female pelvic inflammatory disease, unspecified: Secondary | ICD-10-CM | POA: Diagnosis present

## 2020-11-05 DIAGNOSIS — K611 Rectal abscess: Secondary | ICD-10-CM | POA: Diagnosis present

## 2020-11-05 DIAGNOSIS — R102 Pelvic and perineal pain: Secondary | ICD-10-CM | POA: Diagnosis present

## 2020-11-05 DIAGNOSIS — Z79899 Other long term (current) drug therapy: Secondary | ICD-10-CM

## 2020-11-05 HISTORY — DX: Hyperkalemia: E87.5

## 2020-11-05 LAB — CBC WITH DIFFERENTIAL/PLATELET
Abs Immature Granulocytes: 0.03 10*3/uL (ref 0.00–0.07)
Basophils Absolute: 0 10*3/uL (ref 0.0–0.1)
Basophils Relative: 1 %
Eosinophils Absolute: 0.1 10*3/uL (ref 0.0–0.5)
Eosinophils Relative: 4 %
HCT: 27.3 % — ABNORMAL LOW (ref 36.0–46.0)
Hemoglobin: 8.2 g/dL — ABNORMAL LOW (ref 12.0–15.0)
Immature Granulocytes: 1 %
Lymphocytes Relative: 24 %
Lymphs Abs: 0.8 10*3/uL (ref 0.7–4.0)
MCH: 23.4 pg — ABNORMAL LOW (ref 26.0–34.0)
MCHC: 30 g/dL (ref 30.0–36.0)
MCV: 78 fL — ABNORMAL LOW (ref 80.0–100.0)
Monocytes Absolute: 0.4 10*3/uL (ref 0.1–1.0)
Monocytes Relative: 12 %
Neutro Abs: 1.9 10*3/uL (ref 1.7–7.7)
Neutrophils Relative %: 58 %
Platelets: 204 10*3/uL (ref 150–400)
RBC: 3.5 MIL/uL — ABNORMAL LOW (ref 3.87–5.11)
WBC: 3.3 10*3/uL — ABNORMAL LOW (ref 4.0–10.5)
nRBC: 0 % (ref 0.0–0.2)

## 2020-11-05 LAB — COMPREHENSIVE METABOLIC PANEL
ALT: 54 U/L — ABNORMAL HIGH (ref 0–44)
AST: 50 U/L — ABNORMAL HIGH (ref 15–41)
Albumin: 3.4 g/dL — ABNORMAL LOW (ref 3.5–5.0)
Alkaline Phosphatase: 64 U/L (ref 38–126)
Anion gap: 7 (ref 5–15)
BUN: 26 mg/dL — ABNORMAL HIGH (ref 6–20)
CO2: 21 mmol/L — ABNORMAL LOW (ref 22–32)
Calcium: 8.7 mg/dL — ABNORMAL LOW (ref 8.9–10.3)
Chloride: 107 mmol/L (ref 98–111)
Creatinine, Ser: 4.14 mg/dL — ABNORMAL HIGH (ref 0.44–1.00)
GFR, Estimated: 13 mL/min — ABNORMAL LOW (ref >60)
Glucose, Bld: 100 mg/dL — ABNORMAL HIGH (ref 70–99)
Potassium: 5.2 mmol/L — ABNORMAL HIGH (ref 3.5–5.1)
Sodium: 135 mmol/L (ref 135–145)
Total Bilirubin: 0.2 mg/dL — ABNORMAL LOW (ref 0.3–1.2)
Total Protein: 7.5 g/dL (ref 6.5–8.1)

## 2020-11-05 LAB — URINALYSIS, ROUTINE W REFLEX MICROSCOPIC

## 2020-11-05 LAB — I-STAT BETA HCG BLOOD, ED (MC, WL, AP ONLY): I-stat hCG, quantitative: <5 m[IU]/mL (ref <5)

## 2020-11-05 LAB — URINALYSIS, MICROSCOPIC (REFLEX): RBC / HPF: >50 RBC/hpf (ref 0–5)

## 2020-11-05 LAB — LIPASE, BLOOD: Lipase: 34 U/L (ref 11–51)

## 2020-11-05 MED ORDER — SODIUM CHLORIDE 0.9 % IV BOLUS
1000.0000 mL | Freq: Once | INTRAVENOUS | Status: AC
  Administered 2020-11-05: 1000 mL via INTRAVENOUS

## 2020-11-05 MED ORDER — SODIUM ZIRCONIUM CYCLOSILICATE 10 G PO PACK
10.0000 g | PACK | Freq: Once | ORAL | Status: AC
  Administered 2020-11-05: 10 g via ORAL
  Filled 2020-11-05: qty 1

## 2020-11-05 NOTE — ED Notes (Signed)
Pt transported to CT ?

## 2020-11-05 NOTE — Telephone Encounter (Addendum)
[employer] interpretors used to contact patient - did not answer.   Triage team to help try to reach her sister.   Patient will need to come to ER for evaluation given sudden change in creatinine and acute kidney failure.   Lab Results  Component Value Date   CREATININE 5.87 (H) 10/31/2020   CREATININE 1.62 (H) 10/13/2020   CREATININE 0.71 10/08/2020   Unclear if this is due to a medication side effect or another process but she needs urgent work up for the same.   Would recommend to hold her Biktarvy for now given acute change in eGFR - I do not see where other current treatments for her toxoplasmosis would be responsible for this - will follow up tomorrow after ER evaluation.   Janene Madeira, MSN, NP-C [facility] for Infectious Disease [email or web address] Pager: [phone] Office: [phone] [place]: [phone]

## 2020-11-05 NOTE — ED Provider Notes (Signed)
Emergency Medicine Provider Triage Evaluation Note  Candace Jones , a 40 y.o. female  was evaluated in triage.  Pt complains of abnormal lab. Had labs with infectious disease which showed aki. Pt c/o left flank pain and vomiting. Reports dysuria  Review of Systems  Positive: Flank pain, urinary sxs, nv Negative: fever  Physical Exam  BP 131/83 (BP Location: Right Arm)   Pulse 84   Temp 98.5 F (36.9 C)   Resp 17   LMP 10/06/2020   SpO2 100%  Gen:   Awake, no distress   Resp:  Normal effort  MSK:   Moves extremities without difficulty  Other:  epigastric and ruq ttp, left cva ttp  Medical Decision Making  Medically screening exam initiated at 5:21 PM.  Appropriate orders placed.  Candace Jones was informed that the remainder of the evaluation will be completed by another provider, this initial triage assessment does not replace that evaluation, and the importance of remaining in the ED until their evaluation is complete.     [facility] [place] 11/05/20 1725    Lacretia Leigh, MD 11/07/20 [phone]

## 2020-11-05 NOTE — ED Provider Notes (Signed)
[facility] EMERGENCY DEPARTMENT Provider Note   CSN: [id number] Arrival date & time: 11/05/20  1711     History No chief complaint on file.   Candace Jones is a 40 y.o. female.  HPI   HPI was collected using a [place] interpreter   Patient with significant medical history of AIDS, toxoplasma meningitis presents because she was told by her infectious disease doctor that her kidney functions were extremely elevated and she had to be evaluated here.  She has no complaints at this time, she states that she had some lower abdominal pain  which has been going on for the last couple days, states that she recently had her menstrual cycle and this typically is what happens to her.  She denies any urinary symptoms, denies urinary urgency, frequency, vaginal discharge.  She does endorse that she noticed her urine was bloody today, states is the first time this is ever happened to her, she has no history of kidney stones.  she denies chest pain, shortness of breath, pedal edema orthopnea.     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  She is also recently started on the ART 1 week ago, and there is concerned that this could have possibly damage her kidneys and it is advised by her infectious doctor to withhold this at this time. .  Past Medical History:  Diagnosis Date  . AIDS (acquired immune deficiency syndrome) ([place])   . 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 AKI (acute kidney injury) ([place]) 11/05/2020  . Nausea & vomiting 10/17/2020  . MRI contraindicated  due to metal implant   . 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 AIDS (acquired immune deficiency syndrome) ([place])   . Uterine mass   . Perirectal abscess   . Seizure ([place]) 09/29/2020  . Microcytic hypochromic anemia 09/29/2020    Past Surgical History:  Procedure Laterality Date  . APPLICATION OF CRANIAL NAVIGATION Left 10/02/2020   Procedure: APPLICATION OF CRANIAL NAVIGATION;  Surgeon: Newman Pies, MD;  Location: [facility];  Service: Neurosurgery;  Laterality: Left;  . CRANIOTOMY Left 10/02/2020   Procedure: LEFT CRANIOTOMY FOR TUMOR BIOPSY/ RESECTION with Brain[facility];  Surgeon: Newman Pies, MD;  Location: [facility];  Service: Neurosurgery;  Laterality: Left;     OB History   No obstetric history on file.     Family History  Family history unknown: Yes    Social History   Tobacco Use  . Smoking status: Never Smoker  . Smokeless tobacco: Never Used  Substance Use Topics  . Alcohol use: Never  . Drug use: Never    Home Medications Prior to Admission medications   Medication Sig Start Date End Date Taking? Authorizing Provider  bictegravir-emtricitabine-tenofovir AF (BIKTARVY) 50-200-25 MG TABS tablet Take 1 tablet by mouth daily. 10/17/20  Yes [place] Callas, NP  docusate sodium (COLACE) 100 MG capsule Take 1 capsule (100 mg total) by mouth 2 (two) times daily. 10/10/20  Yes Bonnielee Haff, MD  ferrous sulfate 325 (65 FE) MG tablet Take 1 tablet (325 mg total) by mouth daily with breakfast. 10/10/20 01/08/21 Yes [place] Donnamarie Poag, DO  ibuprofen (ADVIL) 200 MG tablet Take 400 mg by mouth every 6 (six) hours as needed for headache or moderate pain.   Yes [provider]  leucovorin (WELLCOVORIN) 25 MG tablet Take 25 mg by mouth daily.   Yes [provider]  levETIRAcetam (KEPPRA) 750 MG tablet Take 1 tablet (750 mg total) by mouth 2 (two) times daily. 10/31/20 01/29/21 Yes [place] Callas, NP  pantoprazole (PROTONIX) 40 MG tablet Take 1 tablet (40  mg total) by mouth 2 (two) times daily. Patient taking differently: Take 40 mg by mouth at bedtime. 10/31/20  Yes [place] Callas, NP  promethazine (PHENERGAN) 12.5 MG tablet Take 1 tablet (12.5 mg total) by mouth every 6 (six) hours as needed for nausea or vomiting. 10/31/20  Yes Kuppelweiser, Cassie L, RPH-CPP  pyrimethamine (DARAPRIM) 25 MG tablet Take 3 tablets (75 mg total) by mouth daily with breakfast for 12 days. Patient taking differently: Take 75 mg by mouth daily. 11/07/20 11/19/20 Yes [place] Callas, NP  sulfaDIAZINE 500 MG tablet Take 3 tablets (1,500 mg total) by mouth 4 (four) times daily for 12 days. 11/07/20 11/19/20 Yes  Callas, NP    Allergies    Patient has no known allergies.  Review of Systems   Review of Systems  Constitutional: Negative for chills and fever.  HENT: Negative for congestion.   Respiratory: Negative for shortness of breath.   Cardiovascular: Negative for chest pain.  Gastrointestinal: Negative for abdominal pain, nausea and vomiting.  Genitourinary: Positive for hematuria. Negative for difficulty urinating, dyspareunia and enuresis.  Musculoskeletal: Negative for back pain.  Skin: Negative for rash.  Neurological: Negative for dizziness.  Hematological: Does not bruise/bleed easily.    Physical Exam Updated Vital Signs BP 128/71   Pulse 78   Temp 98.5 F (36.9 C)   Resp 16   LMP 10/06/2020   SpO2 100%   Physical Exam Vitals and nursing note reviewed.  Constitutional:      General: She is not in acute distress.    Appearance: She is not ill-appearing.  HENT:     Head: Normocephalic and atraumatic.     Nose: No congestion.  Eyes:     Conjunctiva/sclera: Conjunctivae normal.  Cardiovascular:     Rate and Rhythm: Normal rate and regular rhythm.     Pulses: Normal pulses.     Heart sounds: No murmur heard. No friction rub. No gallop.   Pulmonary:     Effort: No respiratory distress.     Breath sounds: No wheezing, rhonchi  or rales.  Abdominal:     General: There is no distension.     Palpations: Abdomen is soft.     Tenderness: There is abdominal tenderness. There is no right CVA tenderness or left CVA tenderness.     Comments: Abdomen was visualized is nondistended, normoactive bowel sounds, dull to percussion, she is slightly tender to palpation in her epigastric as well as her left lower quadrant, no guarding, rebound tenderness, peritoneal sign, no CVA tenderness.  Musculoskeletal:     Right lower leg: No edema.     Left lower leg: No edema.  Skin:    General: Skin is warm and dry.  Neurological:     Mental Status: She is alert.  Psychiatric:        Mood and Affect: Mood normal.     ED Results / Procedures / Treatments   Labs (all labs ordered are listed, but only abnormal results are displayed) Labs Reviewed  COMPREHENSIVE METABOLIC PANEL - Abnormal; Notable for the following components:      Result Value   Potassium 5.2 (*)    CO2 21 (*)    Glucose, Bld 100 (*)    BUN 26 (*)    Creatinine, Ser 4.14 (*)    Calcium 8.7 (*)    Albumin 3.4 (*)    AST 50 (*)    ALT 54 (*)    Total Bilirubin 0.2 (*)    GFR, Estimated 13 (*)    All other components within normal limits  CBC WITH DIFFERENTIAL/PLATELET - Abnormal; Notable for the following components:   WBC 3.3 (*)    RBC 3.50 (*)    Hemoglobin 8.2 (*)    HCT 27.3 (*)    MCV 78.0 (*)    MCH 23.4 (*)    All other components within normal limits  URINALYSIS, ROUTINE W REFLEX MICROSCOPIC - Abnormal; Notable for the following components:   Color, Urine RED (*)    APPearance TURBID (*)    Glucose, UA   (*)    Value: TEST NOT REPORTED DUE TO COLOR INTERFERENCE OF URINE PIGMENT   Hgb urine dipstick   (*)    Value: TEST NOT REPORTED DUE TO COLOR INTERFERENCE OF URINE PIGMENT   Bilirubin Urine   (*)    Value: TEST NOT REPORTED DUE TO COLOR INTERFERENCE OF URINE PIGMENT   Ketones, ur   (*)    Value: TEST NOT REPORTED DUE TO COLOR INTERFERENCE  OF URINE PIGMENT   Protein, ur   (*)    Value: TEST NOT REPORTED DUE TO COLOR INTERFERENCE OF URINE PIGMENT   Nitrite   (*)    Value: TEST NOT REPORTED DUE TO COLOR INTERFERENCE OF URINE PIGMENT   Leukocytes,Ua   (*)    Value: TEST NOT REPORTED DUE TO COLOR INTERFERENCE OF URINE PIGMENT   All other components within normal limits  URINALYSIS, MICROSCOPIC (REFLEX) - Abnormal; Notable for the following components:   Bacteria, UA RARE (*)    Non Squamous Epithelial PRESENT (*)    All other components within normal limits  URINE CULTURE  LIPASE, BLOOD  I-STAT BETA HCG BLOOD, ED ([facility], [facility], [facility] ONLY)    EKG None  Radiology CT ABDOMEN PELVIS WO CONTRAST  Result Date: 11/05/2020 CLINICAL DATA:  Back and flank pain, renal failure, nausea and vomiting for 2 days, dysuria and EXAM: CT ABDOMEN AND PELVIS WITHOUT CONTRAST TECHNIQUE: Multidetector CT imaging of the abdomen and pelvis was performed following the standard protocol without IV contrast. COMPARISON:  CT 10/17/2020 MR 10/02/2020 FINDINGS: Motion degradation of the lung bases and upper abdomen likely related to respiratory motion artifact. Lower chest: Lung bases appear grossly clear. Borderline cardiomegaly. Hypoattenuation of the cardiac blood pool often reflective of anemia. Trace pericardial effusion. Hepatobiliary: No visible focal liver lesion. Smooth liver surface contour. Normal hepatic attenuation. Gallbladder largely decompressed at the time of exam. Gross gallbladder abnormality is seen. No visible calcified gallstones. No discernible biliary ductal dilatation though evaluation limited upper motion artifact. Pancreas: No discernible pancreatic mass or lesion. No peripancreatic stranding. No visible ductal dilatation. Spleen: Normal in size. No concerning splenic lesions. Adrenals/Urinary Tract: Adrenal glands are difficult to fully discern in the absence of contrast media given extensive motion artifact. Some layering hyperdensity is seen  within the proximal collecting system bilaterally. Given symmetry of this finding, could reflect excreted contrast media versus is milk of calcium. A relatively brush like appearance argues against discrete  urolithiasis though may be accentuated by motion artifact. Mild to moderate bilateral hydroureteronephrosis. Visible distal ureteral calculi. Urinary bladder is largely decompressed at the time of exam and therefore poorly evaluated by CT imaging. Stomach/Bowel: Challenging assessment the bowel in the absence of contrast media or enteric contrast. No gross acute abnormality is seen of the distal thoracic esophagus, stomach or duodenum. Multiple fluid-filled loops of small bowel are present without gross distention. No discernible bowel wall thickening. Moderate to large colonic stool burden. Discrete colonic wall thickening dilatation or dilatation. No CT features of high-grade obstruction. Vascular/Lymphatic: Limited assessment of the vasculature in the absence of contrast media no aneurysm or ectasia is evident. Numerous pelvic phleboliths. Reproductive: Markedly enlarged, heterogeneous uterus compatible the numerous large fibroid seen on comparison CT and MR imaging, better characterized on those exams. No gross interval change from comparison prior. No discrete uterine masses are observable within the limitations of this unenhanced exam. Other: Small fluid attenuation along the right pelvic floor appears to have reaccumulated in the interim from comparison prior CT following removal of a drainage catheter on the CT images dated 10/17/2020. This collection measures 4.4 x 2.2 x 4.3 cm. Some mild surrounding stranding is nonspecific. Musculoskeletal: No acute osseous abnormality or suspicious osseous lesion. Stable chronic angulation of the sacrococcygeal junction. 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 Minimal degenerative changes the spine, hips pelvis. IMPRESSION: 1. Evaluation is complicated by an  absence of intravenous contrast media, extensive respiratory motion artifact and a paucity of intraperitoneal fat, all of which may contribute to limited detection of subtle abnormalities. 2. Reaccumulation of the thick-walled collection along the right pelvic floor following removal of a drainage catheter seen on comparison CT imaging concerning for recurrent perirectal abscess. 3. Markedly enlarged, heterogeneous uterus with multiple dural mass is suspicious for uterine fibroids though underlying leiomyosarcoma is not fully excluded. Findings are better detailed on comparison MR imaging. 4. Persistent and slightly increased mild to moderate hydroureteronephrosis at least to the level of the markedly enlarged uterus, possibly secondary to mass effect. Can certainly result in some diminished renal function. Increased attenuation dependently within the collecting systems could reflect excreted contrast media versus milk of calcium or urolithiasis with motion artifact. 5. Hypoattenuation of the cardiac blood pool, often reflective of anemia. 6. Trace pericardial effusion. Electronically Signed   By: Lovena Le M.D.   On: 11/05/2020 22:19    Procedures Procedures   Medications Ordered in ED Medications  sodium chloride 0.9 % bolus 1,000 mL (1,000 mLs Intravenous New Bag/Given 11/05/20 2257)  sodium zirconium cyclosilicate (LOKELMA) packet 10 g (10 g Oral Given 11/05/20 2258)    ED Course  I have reviewed the triage vital signs and the nursing notes.  Pertinent labs & imaging results that were available during my care of the patient were reviewed by me and considered in my medical decision making (see chart for details).    MDM Rules/Calculators/A&P                         Initial impression-patient presents with abnormal lab work.  She is alert, does not appear to be in acute distress, vital signs reassuring.  Triage obtain basic lab work-up, will obtain CT abdomen pelvis for further  evaluation.  Work-up-CBC shows leukocytopenia with a white blood count of 3.3 appears to be baseline for patient, normocytic anemia with hemoglobin 8.2 appears to be baseline for patient.  CMP shows hyperkalemia 5.2, decreased CO2 of 21,  hyperglycemia 100, creatinine elevated at 4.14 baseline around 1, slightly elevated liver enzymes, decrease GFR of 13.  UA is altered due to color of urine, it does show many red blood cells, few white blood cells, rare bacteria.  CT scan of abdomen reveals thickened wall collection along the right pelvic floor possible for recurrent perirectal abscess, large uterus with multiple masses suspect fibroids, persistent mild to moderate hydroureteronephrosis possibly due to mass-effect from uterus.  Reassessment-updated patient on lab work and imaging, I recommend admission due to her worsening creatinine functions, patient agreeable this.  Will consult with hospitalist for further evaluation.  Consult  1.  Due to elevated creatinine we will speak with nephrology for further recommendations.  Spoke with Dr. Posey Pronto, he states he will come down and evaluate the patient admit to medicine.  2.  Spoke with Dr Olevia Bowens of the hospitalist team, he is except the patient cannot evaluate.  Rule out-low suspicion for systemic infection as patient is nontoxic-appearing, vital signs reassuring, no obvious source infection present my exam.  Low suspicion for kidney stone as CT imaging is negative for this, she does have noted gross hematuria this is possibly due to the ART treatment that patient was recently started on. Low suspicion for UTI or pyelonephritis as patient denies urinary symptoms, no leukocytosis seen on CBC, CT scan negative for these findings.  Low suspicion patient will require emergent hemodialysis as there is no new respiratory requirements, no severe electrolyte derangements, patient is not fluid overloaded.  I have low suspicion for recurrent perirectal abscess as patient  denies rectal pain, vital signs reassuring, no leukocytosis.   Plan-admit to medicine due to AKI, patient care be transferred to hospitalist team for further evaluation.   Final Clinical Impression(s) / ED Diagnoses Final diagnoses:  AKI (acute kidney injury) ([place])  Hyperkalemia    Rx / DC Orders ED Discharge Orders    None       Aron Baba 11/05/20 2345    Lacretia Leigh, MD 11/07/20 [phone]

## 2020-11-05 NOTE — Telephone Encounter (Signed)
Received notification from Janene Madeira, NP that patient's creatinine is 5.87 and that she needs to go to the emergency department for acute kidney failure.   Attempted to call patient with interpreter, no answer.   Called patient's sister and explained that she needs to take the patient to the emergency room to have her kidney function evaluated. Emphasized that patient needs to go to [facility] emergency department, not Stephanie's office. RN offered to call back with [place] interpreter, patient's sister politely declined and stated she understood.   Patient's sister was able to repeat back that she will take the patient to the emergency department to have her kidneys looked at. Advised her to call back with any questions.   Beryle Flock, RN

## 2020-11-05 NOTE — H&P (Signed)
History and Physical    Candace Jones OAC:[id number] DOB: Jul 31, 1980 DOA: 11/05/2020  PCP: Patient, No Pcp Per (Inactive)   Patient coming from: Home.  I have personally briefly reviewed patient's old medical records in [place]  Chief Complaint: Abnormal blood test..  HPI: Candace Jones is a 40 y.o. female with medical history significant of AIDS, toxoplasma meningoencephalitis who was started on [place] recently for AIDS, but follow-up lab work revealed that the patient had a significant increase and BUN/creatinine and decrease in GFR.  She was subsequently sent to the ED for further evaluation and treatment.  She denies dysuria, frequency, hematuria or flank pain.  She does not have a history of urolithiasis to her knowledge.  She denies fever, chills, sore throat, rhinorrhea, wheezing, hemoptysis, dyspnea, chest pain, dizziness, lower extremity edema, PND or orthopnea.  She denies nausea, emesis, abdominal pain, diarrhea, constipation, melena or hematochezia.  No polyuria, polydipsia, polyphagia or blurred vision.  ED Course: Initial vital signs temperature 98.5 F, pulse 84, respiration 17, BP 131/83 mmHg O2 sat 100% on room air.  The patient received a 1000 mL NS bolus.  Lab work: Urinalysis had interference of urine pigment.  CBC shows a white count of 3.3, hemoglobin 8.2 g/dL and platelets 204.  Sodium 135, potassium 5.2, chloride 107 and CO2 21 mmol/L.  Glucose 100, BUN 26 and creatinine 4.14 mg/dL.  Imaging: CT abdomen without contrast showed patient has reaccumulation of thick-walled collection along the right pelvic floor following removal of a drainage catheter concerning for recurrent perirectal abscess.  Persistent and slightly increased mild to moderate hydronephrosis at least to the level of the markedly enlarged uterus, possibly secondary to mass-effect.  Review of Systems: As per HPI otherwise all other systems reviewed and are negative.  Past Medical History:   Diagnosis Date  . AIDS (acquired immune deficiency syndrome) ([place])   . 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 APPLICATION OF CRANIAL NAVIGATION Left 10/02/2020   Procedure: APPLICATION OF CRANIAL NAVIGATION;  Surgeon: Newman Pies, MD;  Location: [place];  Service: Neurosurgery;  Laterality: Left;  . CRANIOTOMY Left 10/02/2020   Procedure: LEFT CRANIOTOMY FOR TUMOR BIOPSY/ RESECTION with BrainLab;  Surgeon: Newman Pies, MD;  Location: [place];  Service: Neurosurgery;  Laterality: Left;    Social History  reports that she has never smoked. She has never used smokeless tobacco. She reports that she does not drink alcohol and does not use drugs.  No Known Allergies  Family History  Family history unknown: Yes   Prior to Admission medications   Medication Sig Start Date End Date Taking? Authorizing Provider  bictegravir-emtricitabine-tenofovir AF (BIKTARVY) 50-200-25 MG TABS tablet Take 1 tablet by mouth daily. 10/17/20  Yes [place] Callas, NP  docusate sodium (COLACE) 100 MG capsule Take 1 capsule (100 mg total) by mouth 2 (two) times daily. 10/10/20  Yes Bonnielee Haff, MD  ferrous sulfate 325 (65 FE) MG tablet Take 1 tablet (325 mg total) by mouth daily with breakfast. 10/10/20 01/08/21 Yes [place] Donnamarie Poag, DO  ibuprofen (ADVIL) 200 MG tablet Take 400 mg by mouth every 6 (six) hours as needed for headache or moderate pain.   Yes [provider]  leucovorin (WELLCOVORIN) 25 MG tablet Take 25 mg by mouth daily.   Yes [provider]  levETIRAcetam (KEPPRA) 750 MG tablet Take 1 tablet (750 mg total) by mouth 2 (two) times daily. 10/31/20 01/29/21 Yes [place],  Melton Krebs, NP  pantoprazole (PROTONIX) 40 MG tablet Take 1 tablet (40 mg total) by mouth 2 (two) times daily. Patient taking differently: Take 40 mg by mouth at bedtime. 10/31/20  Yes [place] Callas, NP  promethazine (PHENERGAN) 12.5 MG tablet Take  1 tablet (12.5 mg total) by mouth every 6 (six) hours as needed for nausea or vomiting. 10/31/20  Yes Kuppelweiser, Cassie L, RPH-CPP  pyrimethamine (DARAPRIM) 25 MG tablet Take 3 tablets (75 mg total) by mouth daily with breakfast for 12 days. Patient taking differently: Take 75 mg by mouth daily. 11/07/20 11/19/20 Yes [place] Callas, NP  sulfaDIAZINE 500 MG tablet Take 3 tablets (1,500 mg total) by mouth 4 (four) times daily for 12 days. 11/07/20 11/19/20 Yes [place] Callas, NP    Physical Exam: Vitals:   11/05/20 2150 11/05/20 2220 11/05/20 2230 11/05/20 2255  BP:  128/87 (!) 135/95 128/71  Pulse: 72 72 78 78  Resp:  16 16 16   Temp:      SpO2: 100% 100%      Constitutional: NAD, calm, comfortable Eyes: PERRL, lids and conjunctivae mildly injected. ENMT: Mucous membranes are moist. Posterior pharynx clear of any exudate or lesions. Neck: normal, supple, no masses, no thyromegaly Respiratory: clear to auscultation bilaterally, no wheezing, no crackles. Normal respiratory effort. No accessory muscle use.  Cardiovascular: Regular rate and rhythm, no murmurs / rubs / gallops. No extremity edema. 2+ pedal pulses. No carotid bruits.  Abdomen: No distention.  Bowel sounds positive.  Soft, no tenderness, no masses palpated. No hepatosplenomegaly. Bowel sounds positive.  Musculoskeletal: no clubbing / cyanosis.  Good ROM, no contractures. Normal muscle tone.  Skin: no new  rashes, lesions, ulcers  Neurologic: CN 2-12 grossly intact. Sensation intact, DTR normal. Strength 5/5 in all 4.  Psychiatric: Normal judgment and insight. Alert and oriented x 3. Normal mood.   Labs on Admission: I have personally reviewed following labs and imaging studies  CBC: Recent Labs  Lab 10/31/20 1126 11/05/20 1725  WBC 2.6* 3.3*  NEUTROABS 1,464* 1.9  HGB 7.8* 8.2*  HCT 26.1* 27.3*  MCV 75.4* 78.0*  PLT 265 401    Basic Metabolic Panel: Recent Labs  Lab 10/31/20 1126 11/05/20 1725  NA 134* 135   K 5.1 5.2*  CL 103 107  CO2 20 21*  GLUCOSE 84 100*  BUN 42* 26*  CREATININE 5.87* 4.14*  CALCIUM 8.8 8.7*    GFR: Estimated Creatinine Clearance: 18.2 mL/min (A) (by C-G formula based on SCr of 4.14 mg/dL (H)).  Liver Function Tests: Recent Labs  Lab 10/31/20 1126 11/05/20 1725  AST 35* 50*  ALT 39* 54*  ALKPHOS  --  64  BILITOT 0.3 0.2*  PROT 7.2 7.5  ALBUMIN  --  3.4*    Urine analysis:    Component Value Date/Time   COLORURINE RED (A) 11/05/2020 1725   APPEARANCEUR TURBID (A) 11/05/2020 1725   LABSPEC  11/05/2020 1725    TEST NOT REPORTED DUE TO COLOR INTERFERENCE OF URINE PIGMENT   PHURINE  11/05/2020 1725    TEST NOT REPORTED DUE TO COLOR INTERFERENCE OF URINE PIGMENT   GLUCOSEU (A) 11/05/2020 1725    TEST NOT REPORTED DUE TO COLOR INTERFERENCE OF URINE PIGMENT   HGBUR (A) 11/05/2020 1725    TEST NOT REPORTED DUE TO COLOR INTERFERENCE OF URINE PIGMENT   BILIRUBINUR (A) 11/05/2020 1725    TEST NOT REPORTED DUE TO COLOR INTERFERENCE OF URINE PIGMENT   KETONESUR (A)  11/05/2020 1725    TEST NOT REPORTED DUE TO COLOR INTERFERENCE OF URINE PIGMENT   PROTEINUR (A) 11/05/2020 1725    TEST NOT REPORTED DUE TO COLOR INTERFERENCE OF URINE PIGMENT   NITRITE (A) 11/05/2020 1725    TEST NOT REPORTED DUE TO COLOR INTERFERENCE OF URINE PIGMENT   LEUKOCYTESUR (A) 11/05/2020 1725    TEST NOT REPORTED DUE TO COLOR INTERFERENCE OF URINE PIGMENT    Radiological Exams on Admission: CT ABDOMEN PELVIS WO CONTRAST  Result Date: 11/05/2020 CLINICAL DATA:  Back and flank pain, renal failure, nausea and vomiting for 2 days, dysuria and EXAM: CT ABDOMEN AND PELVIS WITHOUT CONTRAST TECHNIQUE: Multidetector CT imaging of the abdomen and pelvis was performed following the standard protocol without IV contrast. COMPARISON:  CT 10/17/2020 MR 10/02/2020 FINDINGS: Motion degradation of the lung bases and upper abdomen likely related to respiratory motion artifact. Lower chest: Lung bases  appear grossly clear. Borderline cardiomegaly. Hypoattenuation of the cardiac blood pool often reflective of anemia. Trace pericardial effusion. Hepatobiliary: No visible focal liver lesion. Smooth liver surface contour. Normal hepatic attenuation. Gallbladder largely decompressed at the time of exam. Gross gallbladder abnormality is seen. No visible calcified gallstones. No discernible biliary ductal dilatation though evaluation limited upper motion artifact. Pancreas: No discernible pancreatic mass or lesion. No peripancreatic stranding. No visible ductal dilatation. Spleen: Normal in size. No concerning splenic lesions. Adrenals/Urinary Tract: Adrenal glands are difficult to fully discern in the absence of contrast media given extensive motion artifact. Some layering hyperdensity is seen within the proximal collecting system bilaterally. Given symmetry of this finding, could reflect excreted contrast media versus is milk of calcium. A relatively brush like appearance argues against discrete urolithiasis though may be accentuated by motion artifact. Mild to moderate bilateral hydroureteronephrosis. Visible distal ureteral calculi. Urinary bladder is largely decompressed at the time of exam and therefore poorly evaluated by CT imaging. Stomach/Bowel: Challenging assessment the bowel in the absence of contrast media or enteric contrast. No gross acute abnormality is seen of the distal thoracic esophagus, stomach or duodenum. Multiple fluid-filled loops of small bowel are present without gross distention. No discernible bowel wall thickening. Moderate to large colonic stool burden. Discrete colonic wall thickening dilatation or dilatation. No CT features of high-grade obstruction. Vascular/Lymphatic: Limited assessment of the vasculature in the absence of contrast media no aneurysm or ectasia is evident. Numerous pelvic phleboliths. Reproductive: Markedly enlarged, heterogeneous uterus compatible the numerous large  fibroid seen on comparison CT and MR imaging, better characterized on those exams. No gross interval change from comparison prior. No discrete uterine masses are observable within the limitations of this unenhanced exam. Other: Small fluid attenuation along the right pelvic floor appears to have reaccumulated in the interim from comparison prior CT following removal of a drainage catheter on the CT images dated 10/17/2020. This collection measures 4.4 x 2.2 x 4.3 cm. Some mild surrounding stranding is nonspecific. Musculoskeletal: No acute osseous abnormality or suspicious osseous lesion. Stable chronic angulation of the sacrococcygeal junction. 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 Minimal degenerative changes the spine, hips pelvis. IMPRESSION: 1. Evaluation is complicated by an absence of intravenous contrast media, extensive respiratory motion artifact and a paucity of intraperitoneal fat, all of which may contribute to limited detection of subtle abnormalities. 2. Reaccumulation of the thick-walled collection along the right pelvic floor following removal of a drainage catheter seen on comparison CT imaging concerning for recurrent perirectal abscess. 3. Markedly enlarged, heterogeneous uterus with multiple dural mass is suspicious  for uterine fibroids though underlying leiomyosarcoma is not fully excluded. Findings are better detailed on comparison MR imaging. 4. Persistent and slightly increased mild to moderate hydroureteronephrosis at least to the level of the markedly enlarged uterus, possibly secondary to mass effect. Can certainly result in some diminished renal function. Increased attenuation dependently within the collecting systems could reflect excreted contrast media versus milk of calcium or urolithiasis with motion artifact. 5. Hypoattenuation of the cardiac blood pool, often reflective of anemia. 6. Trace pericardial effusion. Electronically Signed   By: Lovena Le M.D.   On:  11/05/2020 22:19    EKG: Independently reviewed.   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 Continue IV fluids. Monitor intake and output. Follow-up renal function electrolytes. If no improvement, consider nephrology and/or urology input.  Active Problems:   Hyperkalemia Received Lokelma in ED. Continue IV fluids. Follow-up potassium level.    AIDS (acquired immune deficiency syndrome) ([place]) Antiretroviral held by ID clinic. Patient to follow-up with ID for other treatment options    Perirectal abscess Has been afebrile. Area nontender. We will continue to monitor.    Microcytic hypochromic anemia Check anemia panel.    DVT prophylaxis: Lovenox SQ. Code Status:   Full code. Family Communication: Disposition Plan:   Patient is from:  Home.  Anticipated DC to:  Home.  Anticipated DC date:  11/07/2020.  Anticipated DC barriers: Clinical status.  Consults called: Admission status:  Observation/telemetry.  Severity of Illness:  High severity due to developing acute renal failure in the setting of tenofovir administration and history of chronic hydroureteronephrosis.  Reubin Milan MD [facility]  How to contact the [facility] Attending or Consulting provider [place] or covering provider during after hours [place], for this patient?   1. Check the care team in [facility] and look for a) attending/consulting TR[facility] provider listed and b) the [facility] team listed 2. Log into [email or web address] and use [place]'s universal password to access. If you do not have the password, please contact the hospital operator. 3. Locate the [facility] provider you are looking for under [facility] and page to a number that you can be directly reached. 4. If you still have difficulty reaching the provider, please page the [facility] (Director on Call) for the Hospitalists listed on amion for assistance.  11/05/2020, 11:55 PM   This  document was prepared using Dragon voice recognition software and may contain some unintended transcription errors

## 2020-11-05 NOTE — ED Triage Notes (Signed)
Patient directed to ED by infectious disease clinic for renal failure. Patient complains of back pain since last week. Patient complains of nausea and vomiting x 2 days. Complains of dysuria.

## 2020-11-06 ENCOUNTER — Encounter (HOSPITAL_COMMUNITY): Payer: Self-pay | Admitting: Family Medicine

## 2020-11-06 ENCOUNTER — Other Ambulatory Visit: Payer: Self-pay | Admitting: Pharmacist

## 2020-11-06 ENCOUNTER — Telehealth: Payer: Self-pay | Admitting: Pharmacist

## 2020-11-06 DIAGNOSIS — N131 Hydronephrosis with ureteral stricture, not elsewhere classified: Secondary | ICD-10-CM | POA: Diagnosis present

## 2020-11-06 DIAGNOSIS — N179 Acute kidney failure, unspecified: Secondary | ICD-10-CM | POA: Diagnosis present

## 2020-11-06 DIAGNOSIS — G44039 Episodic paroxysmal hemicrania, not intractable: Secondary | ICD-10-CM

## 2020-11-06 DIAGNOSIS — R12 Heartburn: Secondary | ICD-10-CM

## 2020-11-06 DIAGNOSIS — K611 Rectal abscess: Secondary | ICD-10-CM

## 2020-11-06 DIAGNOSIS — B20 Human immunodeficiency virus [HIV] disease: Secondary | ICD-10-CM | POA: Diagnosis present

## 2020-11-06 DIAGNOSIS — D259 Leiomyoma of uterus, unspecified: Secondary | ICD-10-CM | POA: Diagnosis present

## 2020-11-06 DIAGNOSIS — N920 Excessive and frequent menstruation with regular cycle: Secondary | ICD-10-CM | POA: Diagnosis present

## 2020-11-06 DIAGNOSIS — K30 Functional dyspepsia: Secondary | ICD-10-CM | POA: Diagnosis present

## 2020-11-06 DIAGNOSIS — D62 Acute posthemorrhagic anemia: Secondary | ICD-10-CM | POA: Diagnosis present

## 2020-11-06 DIAGNOSIS — Z597 Insufficient social insurance and welfare support: Secondary | ICD-10-CM | POA: Diagnosis not present

## 2020-11-06 DIAGNOSIS — Z20822 Contact with and (suspected) exposure to covid-19: Secondary | ICD-10-CM | POA: Diagnosis present

## 2020-11-06 DIAGNOSIS — E875 Hyperkalemia: Secondary | ICD-10-CM | POA: Diagnosis present

## 2020-11-06 DIAGNOSIS — N739 Female pelvic inflammatory disease, unspecified: Secondary | ICD-10-CM | POA: Diagnosis present

## 2020-11-06 DIAGNOSIS — G40909 Epilepsy, unspecified, not intractable, without status epilepticus: Secondary | ICD-10-CM | POA: Diagnosis present

## 2020-11-06 DIAGNOSIS — H538 Other visual disturbances: Secondary | ICD-10-CM

## 2020-11-06 DIAGNOSIS — R310 Gross hematuria: Secondary | ICD-10-CM | POA: Diagnosis present

## 2020-11-06 DIAGNOSIS — N139 Obstructive and reflux uropathy, unspecified: Secondary | ICD-10-CM

## 2020-11-06 DIAGNOSIS — D509 Iron deficiency anemia, unspecified: Secondary | ICD-10-CM

## 2020-11-06 DIAGNOSIS — Z79899 Other long term (current) drug therapy: Secondary | ICD-10-CM | POA: Diagnosis not present

## 2020-11-06 DIAGNOSIS — D638 Anemia in other chronic diseases classified elsewhere: Secondary | ICD-10-CM | POA: Diagnosis present

## 2020-11-06 DIAGNOSIS — D619 Aplastic anemia, unspecified: Secondary | ICD-10-CM | POA: Diagnosis present

## 2020-11-06 DIAGNOSIS — B582 Toxoplasma meningoencephalitis: Secondary | ICD-10-CM | POA: Diagnosis present

## 2020-11-06 LAB — CBC
HCT: 23.8 % — ABNORMAL LOW (ref 36.0–46.0)
Hemoglobin: 7.1 g/dL — ABNORMAL LOW (ref 12.0–15.0)
MCH: 23.3 pg — ABNORMAL LOW (ref 26.0–34.0)
MCHC: 29.8 g/dL — ABNORMAL LOW (ref 30.0–36.0)
MCV: 78 fL — ABNORMAL LOW (ref 80.0–100.0)
Platelets: 186 10*3/uL (ref 150–400)
RBC: 3.05 MIL/uL — ABNORMAL LOW (ref 3.87–5.11)
WBC: 3.8 10*3/uL — ABNORMAL LOW (ref 4.0–10.5)
nRBC: 0 % (ref 0.0–0.2)

## 2020-11-06 LAB — URINALYSIS, ROUTINE W REFLEX MICROSCOPIC
Bilirubin Urine: NEGATIVE
Glucose, UA: NEGATIVE mg/dL
Ketones, ur: NEGATIVE mg/dL
Nitrite: NEGATIVE
Protein, ur: NEGATIVE mg/dL
Specific Gravity, Urine: 1.008 (ref 1.005–1.030)
pH: 6 (ref 5.0–8.0)

## 2020-11-06 LAB — BASIC METABOLIC PANEL
Anion gap: 7 (ref 5–15)
BUN: 28 mg/dL — ABNORMAL HIGH (ref 6–20)
CO2: 22 mmol/L (ref 22–32)
Calcium: 8.8 mg/dL — ABNORMAL LOW (ref 8.9–10.3)
Chloride: 109 mmol/L (ref 98–111)
Creatinine, Ser: 4.4 mg/dL — ABNORMAL HIGH (ref 0.44–1.00)
GFR, Estimated: 12 mL/min — ABNORMAL LOW (ref >60)
Glucose, Bld: 110 mg/dL — ABNORMAL HIGH (ref 70–99)
Potassium: 4.2 mmol/L (ref 3.5–5.1)
Sodium: 138 mmol/L (ref 135–145)

## 2020-11-06 LAB — SARS CORONAVIRUS 2 (TAT 6-24 HRS): SARS Coronavirus 2: NEGATIVE

## 2020-11-06 MED ORDER — LEUCOVORIN CALCIUM 25 MG PO TABS: 25.0000 mg | ORAL_TABLET | Freq: Every day | ORAL | 0 refills | Status: DC

## 2020-11-06 MED ORDER — LEUCOVORIN CALCIUM 25 MG PO TABS
25.0000 mg | ORAL_TABLET | Freq: Every day | ORAL | Status: DC
  Administered 2020-11-06 – 2020-11-15 (×10): 25 mg via ORAL
  Filled 2020-11-06 (×10): qty 1

## 2020-11-06 MED ORDER — FERROUS SULFATE 325 (65 FE) MG PO TABS
325.0000 mg | ORAL_TABLET | Freq: Every day | ORAL | Status: DC
  Administered 2020-11-06 – 2020-11-15 (×10): 325 mg via ORAL
  Filled 2020-11-06 (×10): qty 1

## 2020-11-06 MED ORDER — SODIUM CHLORIDE 0.9 % IV SOLN: INTRAVENOUS | Status: AC

## 2020-11-06 MED ORDER — SULFADIAZINE 500 MG PO TABS
1500.0000 mg | ORAL_TABLET | Freq: Once | ORAL | Status: DC
  Filled 2020-11-06: qty 3

## 2020-11-06 MED ORDER — PANTOPRAZOLE SODIUM 40 MG PO TBEC
40.0000 mg | DELAYED_RELEASE_TABLET | Freq: Every day | ORAL | Status: DC
  Administered 2020-11-06 – 2020-11-14 (×10): 40 mg via ORAL
  Filled 2020-11-06 (×10): qty 1

## 2020-11-06 MED ORDER — PYRIMETHAMINE 25 MG PO TABS: 75.0000 mg | ORAL_TABLET | Freq: Every day | ORAL | 0 refills | Status: DC

## 2020-11-06 MED ORDER — ENOXAPARIN SODIUM 30 MG/0.3ML IJ SOSY
30.0000 mg | PREFILLED_SYRINGE | INTRAMUSCULAR | Status: DC
  Administered 2020-11-06: 30 mg via SUBCUTANEOUS
  Filled 2020-11-06: qty 0.3

## 2020-11-06 MED ORDER — PROMETHAZINE HCL 25 MG PO TABS
12.5000 mg | ORAL_TABLET | Freq: Four times a day (QID) | ORAL | Status: DC | PRN
  Administered 2020-11-07 – 2020-11-14 (×10): 12.5 mg via ORAL
  Filled 2020-11-06 (×10): qty 1

## 2020-11-06 MED ORDER — ACETAMINOPHEN 325 MG PO TABS
650.0000 mg | ORAL_TABLET | Freq: Four times a day (QID) | ORAL | Status: DC | PRN
  Administered 2020-11-08: 650 mg via ORAL
  Filled 2020-11-06: qty 2

## 2020-11-06 MED ORDER — PYRIMETHAMINE 25 MG PO TABS
75.0000 mg | ORAL_TABLET | Freq: Every day | ORAL | Status: DC
  Filled 2020-11-06: qty 3

## 2020-11-06 MED ORDER — SULFADIAZINE 500 MG PO TABS
1500.0000 mg | ORAL_TABLET | Freq: Once | ORAL | Status: AC
  Administered 2020-11-06: 1500 mg via ORAL
  Filled 2020-11-06: qty 3

## 2020-11-06 MED ORDER — DOLUTEGRAVIR SODIUM 50 MG PO TABS
50.0000 mg | ORAL_TABLET | Freq: Every day | ORAL | Status: DC
  Administered 2020-11-06 – 2020-11-15 (×10): 50 mg via ORAL
  Filled 2020-11-06 (×10): qty 1

## 2020-11-06 MED ORDER — SULFADIAZINE 500 MG PO TABS
1500.0000 mg | ORAL_TABLET | Freq: Four times a day (QID) | ORAL | Status: AC
  Administered 2020-11-06 – 2020-11-07 (×2): 1500 mg via ORAL
  Filled 2020-11-06 (×4): qty 3

## 2020-11-06 MED ORDER — LACTATED RINGERS IV SOLN: INTRAVENOUS | Status: DC

## 2020-11-06 MED ORDER — ENOXAPARIN SODIUM 30 MG/0.3ML IJ SOSY: 30.0000 mg | PREFILLED_SYRINGE | INTRAMUSCULAR | Status: DC

## 2020-11-06 MED ORDER — LEVETIRACETAM 750 MG PO TABS
750.0000 mg | ORAL_TABLET | Freq: Two times a day (BID) | ORAL | Status: DC
  Administered 2020-11-06 – 2020-11-15 (×20): 750 mg via ORAL
  Filled 2020-11-06 (×22): qty 1

## 2020-11-06 MED ORDER — ACETAMINOPHEN 650 MG RE SUPP: 650.0000 mg | Freq: Four times a day (QID) | RECTAL | Status: DC | PRN

## 2020-11-06 MED ORDER — DOCUSATE SODIUM 100 MG PO CAPS
100.0000 mg | ORAL_CAPSULE | Freq: Two times a day (BID) | ORAL | Status: DC
  Administered 2020-11-06 – 2020-11-15 (×17): 100 mg via ORAL
  Filled 2020-11-06 (×18): qty 1

## 2020-11-06 MED ORDER — ACETAMINOPHEN 325 MG PO TABS
650.0000 mg | ORAL_TABLET | Freq: Once | ORAL | Status: AC
  Administered 2020-11-06: 650 mg via ORAL
  Filled 2020-11-06: qty 2

## 2020-11-06 MED ORDER — PYRIMETHAMINE 25 MG PO TABS
75.0000 mg | ORAL_TABLET | Freq: Every day | ORAL | Status: DC
  Administered 2020-11-06 – 2020-11-15 (×10): 75 mg via ORAL
  Filled 2020-11-06 (×10): qty 3

## 2020-11-06 MED ORDER — OMEPRAZOLE 20 MG PO CPDR: 20.0000 mg | DELAYED_RELEASE_CAPSULE | Freq: Every day | ORAL | 0 refills | Status: DC

## 2020-11-06 MED ORDER — LAMIVUDINE 150 MG PO TABS
300.0000 mg | ORAL_TABLET | Freq: Every day | ORAL | Status: DC
  Administered 2020-11-06 – 2020-11-15 (×10): 300 mg via ORAL
  Filled 2020-11-06 (×10): qty 2

## 2020-11-06 MED ORDER — SULFADIAZINE 500 MG PO TABS
1500.0000 mg | ORAL_TABLET | Freq: Four times a day (QID) | ORAL | Status: DC
  Administered 2020-11-06: 1500 mg via ORAL

## 2020-11-06 NOTE — Progress Notes (Signed)
Pt given entire complement of HS medications. RN went to get some water for pt. Returned to room and pt had taken almost entire cup of medications and had vomited some of them into the trash trying to take all at the same time. Unable to determine which medications were in trash and which ones she was able to keep down. Instructed pt that it is not necessary to take all medications at once and to take them one at a time or with applesauce/pudding etc. Verbalizes understanding. Pt angry with RN thinking that she had to take all medications at once, which was never relayed to her. Reiterated for her to take them as slow as needed to keep them down due to the need for them to stay in her body. Again verbalizes understanding. NT in room and witnessed all that transpired.

## 2020-11-06 NOTE — Consult Note (Signed)
[facility] Renal Consultation Note  Requesting MD: Cordelia Poche, MD Indication for Consultation:  AKI  Chief complaint: sent the ER per outpatient labs  HPI:  Candace Jones is a 40 y.o. 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  She had Cr 0.71 end of April as below then increase to 1.62 on 5/2 and to 5.87 on 5/20.  Labs here a little better but still with AKI.  She has also been using ibuprofen for headache and states taking BID.  Previously took [employer] as well.  Work-up here demonstrated an enlarged uterus with possible mass effect and slightly increased mild to moderate hydroureteronephrosis.  Also with concern for recurrent perirectal abscess.  Had hematuria but note she also reported being on her period; LMP last Sunday and her period ended on 5/25.  She has been on LR at 125 ml/hr and got 1 liter NS in the ER.  Her sister is here and supplements history.   She was interviewed today with a [place] interpreter using a video program.    Creat  Date/Time Value Ref Range Status  10/31/2020 11:26 AM 5.87 (H) 0.50 - 1.10 mg/dL Final   Creatinine, Ser  Date/Time Value Ref Range Status  11/06/2020 02:08 AM 4.40 (H) 0.44 - 1.00 mg/dL Final  11/05/2020 05:25 PM 4.14 (H) 0.44 - 1.00 mg/dL Final  10/13/2020 04:22 PM 1.62 (H) 0.44 - 1.00 mg/dL Final  10/08/2020 04:15 AM 0.71 0.44 - 1.00 mg/dL Final  10/06/2020 07:54 AM 0.73 0.44 - 1.00 mg/dL Final  10/05/2020 03:59 AM 0.79 0.44 - 1.00 mg/dL Final  10/03/2020 04:20 AM 0.59 0.44 - 1.00 mg/dL Final  10/02/2020 04:56 AM 0.49 0.44 - 1.00 mg/dL Final  10/01/2020 07:44 AM 0.58 0.44 - 1.00 mg/dL Final  09/30/2020 05:55 AM 0.48 0.44 - 1.00 mg/dL Final  09/29/2020 06:22 PM 0.50 0.44 - 1.00 mg/dL Final  09/29/2020 06:14 PM 0.57 0.44 - 1.00 mg/dL Final     PMHx:   Past Medical History:  Diagnosis Date  . AIDS (acquired immune deficiency  syndrome) ([place])   . 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 APPLICATION OF CRANIAL NAVIGATION Left 10/02/2020   Procedure: APPLICATION OF CRANIAL NAVIGATION;  Surgeon: Newman Pies, MD;  Location: [place];  Service: Neurosurgery;  Laterality: Left;  . CRANIOTOMY Left 10/02/2020   Procedure: LEFT CRANIOTOMY FOR TUMOR BIOPSY/ RESECTION with BrainLab;  Surgeon: Newman Pies, MD;  Location: [place];  Service: Neurosurgery;  Laterality: Left;    Family Hx:  Family History  Family history unknown: Yes    Social History:  reports that she has never smoked. She has never used smokeless tobacco. She reports that she does not drink alcohol and does not use drugs.  Allergies: No Known Allergies  Medications: Prior to Admission medications   Medication Sig Start Date End Date Taking? Authorizing Provider  bictegravir-emtricitabine-tenofovir AF (BIKTARVY) 50-200-25 MG TABS tablet Take 1 tablet by mouth daily. 10/17/20  Yes  Callas, NP  docusate sodium (COLACE) 100 MG capsule Take 1 capsule (100 mg total) by mouth 2 (two) times daily. 10/10/20  Yes Bonnielee Haff, MD  ferrous sulfate 325 (65 FE) MG tablet Take 1 tablet (325 mg total) by mouth daily with breakfast. 10/10/20 01/08/21 Yes [place] Donnamarie Poag, DO  ibuprofen (ADVIL) 200 MG tablet Take 400 mg by mouth every 6 (six) hours as needed for  headache or moderate pain.   Yes [provider]  leucovorin (WELLCOVORIN) 25 MG tablet Take 25 mg by mouth daily.   Yes [provider]  levETIRAcetam (KEPPRA) 750 MG tablet Take 1 tablet (750 mg total) by mouth 2 (two) times daily. 10/31/20 01/29/21 Yes [place] Callas, NP  pantoprazole (PROTONIX) 40 MG tablet Take 1 tablet (40 mg total) by mouth 2 (two) times daily. Patient taking differently: Take 40 mg by mouth at bedtime. 10/31/20  Yes [place] Callas, NP  promethazine (PHENERGAN) 12.5 MG tablet Take 1 tablet  (12.5 mg total) by mouth every 6 (six) hours as needed for nausea or vomiting. 10/31/20  Yes Kuppelweiser, Cassie L, RPH-CPP  pyrimethamine (DARAPRIM) 25 MG tablet Take 3 tablets (75 mg total) by mouth daily with breakfast for 12 days. Patient taking differently: Take 75 mg by mouth daily. 11/07/20 11/19/20 Yes [place] Callas, NP  sulfaDIAZINE 500 MG tablet Take 3 tablets (1,500 mg total) by mouth 4 (four) times daily for 12 days. 11/07/20 11/19/20 Yes [place] Callas, NP    I have reviewed the patient's current and reported prior to admission medications.  Labs:  BMP Latest Ref Rng & Units 11/06/2020 11/05/2020 10/31/2020  Glucose 70 - 99 mg/dL 110(H) 100(H) 84  BUN 6 - 20 mg/dL 28(H) 26(H) 42(H)  Creatinine 0.44 - 1.00 mg/dL 4.40(H) 4.14(H) 5.87(H)  BUN/Creat Ratio 6 - 22 (calc) - - 7  Sodium 135 - 145 mmol/L 138 135 134(L)  Potassium 3.5 - 5.1 mmol/L 4.2 5.2(H) 5.1  Chloride 98 - 111 mmol/L 109 107 103  CO2 22 - 32 mmol/L 22 21(L) 20  Calcium 8.9 - 10.3 mg/dL 8.8(L) 8.7(L) 8.8    Urinalysis    Component Value Date/Time   COLORURINE RED (A) 11/05/2020 1725   APPEARANCEUR TURBID (A) 11/05/2020 1725   LABSPEC  11/05/2020 1725    TEST NOT REPORTED DUE TO COLOR INTERFERENCE OF URINE PIGMENT   PHURINE  11/05/2020 1725    TEST NOT REPORTED DUE TO COLOR INTERFERENCE OF URINE PIGMENT   GLUCOSEU (A) 11/05/2020 1725    TEST NOT REPORTED DUE TO COLOR INTERFERENCE OF URINE PIGMENT   HGBUR (A) 11/05/2020 1725    TEST NOT REPORTED DUE TO COLOR INTERFERENCE OF URINE PIGMENT   BILIRUBINUR (A) 11/05/2020 1725    TEST NOT REPORTED DUE TO COLOR INTERFERENCE OF URINE PIGMENT   KETONESUR (A) 11/05/2020 1725    TEST NOT REPORTED DUE TO COLOR INTERFERENCE OF URINE PIGMENT   PROTEINUR (A) 11/05/2020 1725    TEST NOT REPORTED DUE TO COLOR INTERFERENCE OF URINE PIGMENT   NITRITE (A) 11/05/2020 1725    TEST NOT REPORTED DUE TO COLOR INTERFERENCE OF URINE PIGMENT   LEUKOCYTESUR (A) 11/05/2020 1725     TEST NOT REPORTED DUE TO COLOR INTERFERENCE OF URINE PIGMENT     ROS:  Pertinent items noted in HPI and remainder of comprehensive ROS otherwise negative.    Physical Exam: Vitals:   11/06/20 1100 11/06/20 1155  BP: 131/88   Pulse: 82   Resp: 20   Temp:  98.4 F (36.9 C)  SpO2: 100%      General: adult female in stretcher in NAD at rest HEENT: NCAT Eyes: EOMI sclera anicteric Neck: supple trachea midline Heart: S1S2 no rub Lungs: clear and unlabored on room air Abdomen: soft/nt/nd Extremities: no edema no cyanosis or clubbing Skin: no rash on extremities exposed Neuro: alert and oriented and conversant.  Initially doesn't answer  the year but then states 2022 - using assistance of video interpreter  Assessment/Plan:  # AKI  - Secondary to tenofovir and cannot rule out contribution of the hydroureteronephrosis  - Gentle fluids at NS at 75/hr x 18 hours (LR is ordered but not hanging on my exam) - Change to renal diet - Urology as below  # AIDS - Would defer any compounds with tenofovir   # Mild to moderate bilateral hydroureteronephrosis  - May be from mass effect from enlarged uterus  - Would consult GYN and urology  # Anemia microcytic  - Would transfuse PRBC's if Hb drops further  - anticipate will need PRBC's  # Perirectal abscess - noted as concern for recurrence per imaging this admission  Claudia Desanctis 11/06/2020, 1:39 PM

## 2020-11-06 NOTE — ED Notes (Signed)
Attempted report x1. 

## 2020-11-06 NOTE — ED Notes (Signed)
Attempted report x 2 

## 2020-11-06 NOTE — Progress Notes (Signed)
PROGRESS NOTE    Candace Jones  ZOX:[id number] DOB: Apr 09, 1981 DOA: 11/05/2020 PCP: Patient, No Pcp Per (Inactive)   Brief Narrative: Candace Jones is a 40 y.o. female history of AIDS, toxoplasma meningoencephalitis, recent perirectal abscess. Patient presented secondary to abnormal labs with evidence of elevated creatinine of unknown etiology  Assessment & Plan:   Principal Problem:   AKI (acute kidney injury) ([place]) Active Problems:   Microcytic hypochromic anemia   AIDS (acquired immune deficiency syndrome) ([place])   Perirectal abscess   Hyperkalemia   AKI Unknown etiology. She has been taking ibuprofen to help with headaches. She reports taking about 400 mg twice per day for less than one week. She ran out of ibuprofen yesterday and started taking Aleve. CT imaging with evidence of ureteral mass every and resultant mild-moderate bilateral hydronephrosis; discussed with urology who states this chronic issues likely is not accounting for AKI. Complicated by patient's AIDS status. -Continue IV fluids; if no improvement tomorrow, will consult nephrology -BMP in AM  Gross hematuria Unsure of etiology.  CT scan performed on admission with no evidence of nephrolithiasis. Associated bilateral flank pain that radiated down towards pelvis and is improved. Hematuria has improved. Discussed with urology who recommended outpatient urology resident clinic visit.  Acute blood loss anemia Acute on chronic anemia Patient with chronic anemia.  Previous iron panel from April 2022 significant for iron deficiency anemia.  Patient also with recent gross hematuria which likely contributed to acute component.  Baseline hemoglobin of about 8-9.  Down to 7.1 this morning.  Patient does not have continued hematuria. -Serial CBC, transfuse as needed for hemoglobin less than 7  Perirectal abscess Previously treated with percutaneous drain placement and antibiotics in April 2022.  Complicated by AIDS  diagnosis. -General surgery consult  AIDS Undetectable CD4 count.  Viral load of 57,200 copies per milliliter.  Patient is on [place] as an outpatient which has been held secondary to severe renal impairment. -Infectious disease consult  Uterine masses Previously diagnosed as fibroids vs leiomyosarcoma. Gynecology consulted at previous visit who recommended outpatient follow-up. -Will consult gynecology inpatient  Hyperkalemia Mildly elevated secondary to AKI. Given one dose of Lokelma.  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 Started on sulfadiazine and pyrimethamine  -Infectious disease consult   DVT prophylaxis: SCDs Code Status:   Code Status: Full Code Family Communication: None at bedside Disposition Plan: Discharge home likely in several days pending continued management/improvement of AKI, management of abscess   Consultants:   Infectious disease  Urology  General surgery  Procedures:   None  Antimicrobials:  None   Subjective: No concerns this morning. Pain has improved.  Objective: Vitals:   11/06/20 0400 11/06/20 0515 11/06/20 0600 11/06/20 0800  BP: 121/85 116/83 120/86 129/85  Pulse: 65 70  73  Resp: 16 15 16 14   Temp:      TempSrc:      SpO2: 100% 100%  100%    Intake/Output Summary (Last 24 hours) at 11/06/2020 8119 Last data filed at 11/06/2020 0055 Gross per 24 hour  Intake 1000 ml  Output --  Net 1000 ml   There were no vitals filed for this visit.  Examination:  General exam: Appears calm and comfortable Respiratory system: Clear to auscultation. Respiratory effort normal. Cardiovascular system: S1 & S2 heard, RRR. 2/6 systolic murmur Gastrointestinal system: Abdomen is nondistended, soft and nontender. No organomegaly or masses felt. Normal bowel sounds heard. Genitourinary: no appreciable abscess on exam Central nervous system: Alert  and oriented. No focal neurological deficits. Musculoskeletal: No edema. No calf  tenderness Skin: No cyanosis. No rashes Psychiatry: Judgement and insight appear normal. Mood & affect appropriate.     Data Reviewed: I have personally reviewed following labs and imaging studies  CBC Lab Results  Component Value Date   WBC 3.8 (L) 11/06/2020   RBC 3.05 (L) 11/06/2020   HGB 7.1 (L) 11/06/2020   HCT 23.8 (L) 11/06/2020   MCV 78.0 (L) 11/06/2020   MCH 23.3 (L) 11/06/2020   PLT 186 11/06/2020   MCHC 29.8 (L) 11/06/2020   RDW Not Measured 11/06/2020   LYMPHSABS 0.8 11/05/2020   MONOABS 0.4 11/05/2020   EOSABS 0.1 11/05/2020   BASOSABS 0.0 35/46/5681     Last metabolic panel Lab Results  Component Value Date   NA 138 11/06/2020   K 4.2 11/06/2020   CL 109 11/06/2020   CO2 22 11/06/2020   BUN 28 (H) 11/06/2020   CREATININE 4.40 (H) 11/06/2020   GLUCOSE 110 (H) 11/06/2020   GFRNONAA 12 (L) 11/06/2020   CALCIUM 8.8 (L) 11/06/2020   PHOS 3.6 10/01/2020   PROT 7.5 11/05/2020   ALBUMIN 3.4 (L) 11/05/2020   BILITOT 0.2 (L) 11/05/2020   ALKPHOS 64 11/05/2020   AST 50 (H) 11/05/2020   ALT 54 (H) 11/05/2020   ANIONGAP 7 11/06/2020    CBG (last 3)  No results for input(s): GLUCAP in the last 72 hours.   GFR: Estimated Creatinine Clearance: 17.1 mL/min (A) (by C-G formula based on SCr of 4.4 mg/dL (H)).  Coagulation Profile: No results for input(s): INR, PROTIME in the last 168 hours.  Recent Results (from the past 240 hour(s))  SARS CORONAVIRUS 2 (TAT 6-24 HRS) Nasopharyngeal Nasopharyngeal Swab     Status: None   Collection Time: 11/06/20 12:14 AM   Specimen: Nasopharyngeal Swab  Result Value Ref Range Status   SARS Coronavirus 2 NEGATIVE NEGATIVE Final    Comment: (NOTE) SARS-CoV-2 target nucleic acids are NOT DETECTED.  The SARS-CoV-2 RNA is generally detectable in upper and lower respiratory specimens during the acute phase of infection. Negative results do not preclude SARS-CoV-2 infection, do not rule out co-infections with other  pathogens, and should not be used as the sole basis for treatment or other patient management decisions. Negative results must be combined with clinical observations, patient history, and epidemiological information. The expected result is Negative.  Fact Sheet for Patients: SugarRoll.be  Fact Sheet for Healthcare Providers: https://www.woods-mathews.com/  This test is not yet approved or cleared by the [place] FDA and  has been authorized for detection and/or diagnosis of SARS-CoV-2 by FDA under an Emergency Use Authorization (EUA). This EUA will remain  in effect (meaning this test can be used) for the duration of the COVID-19 declaration under Se ction 564(b)(1) of the Act, 21 U.S.C. section 360bbb-3(b)(1), unless the authorization is terminated or revoked sooner.  Performed at [facility], [place] [address], [place], [place] [address]         Radiology Studies: CT ABDOMEN PELVIS WO CONTRAST  Result Date: 11/05/2020 CLINICAL DATA:  Back and flank pain, renal failure, nausea and vomiting for 2 days, dysuria and EXAM: CT ABDOMEN AND PELVIS WITHOUT CONTRAST TECHNIQUE: Multidetector CT imaging of the abdomen and pelvis was performed following the standard protocol without IV contrast. COMPARISON:  CT 10/17/2020 MR 10/02/2020 FINDINGS: Motion degradation of the lung bases and upper abdomen likely related to respiratory motion artifact. Lower chest: Lung bases appear grossly  clear. Borderline cardiomegaly. Hypoattenuation of the cardiac blood pool often reflective of anemia. Trace pericardial effusion. Hepatobiliary: No visible focal liver lesion. Smooth liver surface contour. Normal hepatic attenuation. Gallbladder largely decompressed at the time of exam. Gross gallbladder abnormality is seen. No visible calcified gallstones. No discernible biliary ductal dilatation though evaluation limited upper motion artifact. Pancreas: No  discernible pancreatic mass or lesion. No peripancreatic stranding. No visible ductal dilatation. Spleen: Normal in size. No concerning splenic lesions. Adrenals/Urinary Tract: Adrenal glands are difficult to fully discern in the absence of contrast media given extensive motion artifact. Some layering hyperdensity is seen within the proximal collecting system bilaterally. Given symmetry of this finding, could reflect excreted contrast media versus is milk of calcium. A relatively brush like appearance argues against discrete urolithiasis though may be accentuated by motion artifact. Mild to moderate bilateral hydroureteronephrosis. Visible distal ureteral calculi. Urinary bladder is largely decompressed at the time of exam and therefore poorly evaluated by CT imaging. Stomach/Bowel: Challenging assessment the bowel in the absence of contrast media or enteric contrast. No gross acute abnormality is seen of the distal thoracic esophagus, stomach or duodenum. Multiple fluid-filled loops of small bowel are present without gross distention. No discernible bowel wall thickening. Moderate to large colonic stool burden. Discrete colonic wall thickening dilatation or dilatation. No CT features of high-grade obstruction. Vascular/Lymphatic: Limited assessment of the vasculature in the absence of contrast media no aneurysm or ectasia is evident. Numerous pelvic phleboliths. Reproductive: Markedly enlarged, heterogeneous uterus compatible the numerous large fibroid seen on comparison CT and MR imaging, better characterized on those exams. No gross interval change from comparison prior. No discrete uterine masses are observable within the limitations of this unenhanced exam. Other: Small fluid attenuation along the right pelvic floor appears to have reaccumulated in the interim from comparison prior CT following removal of a drainage catheter on the CT images dated 10/17/2020. This collection measures 4.4 x 2.2 x 4.3 cm. Some  mild surrounding stranding is nonspecific. Musculoskeletal: No acute osseous abnormality or suspicious osseous lesion. Stable chronic angulation of the sacrococcygeal junction. 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 Minimal degenerative changes the spine, hips pelvis. IMPRESSION: 1. Evaluation is complicated by an absence of intravenous contrast media, extensive respiratory motion artifact and a paucity of intraperitoneal fat, all of which may contribute to limited detection of subtle abnormalities. 2. Reaccumulation of the thick-walled collection along the right pelvic floor following removal of a drainage catheter seen on comparison CT imaging concerning for recurrent perirectal abscess. 3. Markedly enlarged, heterogeneous uterus with multiple dural mass is suspicious for uterine fibroids though underlying leiomyosarcoma is not fully excluded. Findings are better detailed on comparison MR imaging. 4. Persistent and slightly increased mild to moderate hydroureteronephrosis at least to the level of the markedly enlarged uterus, possibly secondary to mass effect. Can certainly result in some diminished renal function. Increased attenuation dependently within the collecting systems could reflect excreted contrast media versus milk of calcium or urolithiasis with motion artifact. 5. Hypoattenuation of the cardiac blood pool, often reflective of anemia. 6. Trace pericardial effusion. Electronically Signed   By: Lovena Le M.D.   On: 11/05/2020 22:19        Scheduled Meds: . docusate sodium  100 mg Oral BID  . enoxaparin (LOVENOX) injection  30 mg Subcutaneous Q24H  . ferrous sulfate  325 mg Oral Q breakfast  . leucovorin  25 mg Oral Daily  . levETIRAcetam  750 mg Oral BID  . pantoprazole  40  mg Oral QHS  . [START ON 11/07/2020] pyrimethamine  75 mg Oral Q breakfast  . sulfaDIAZINE  1,500 mg Oral QID   Continuous Infusions: . lactated ringers 125 mL/hr at 11/06/20 0124     LOS: 0 days      Cordelia Poche, MD [facility] 11/06/2020, 8:32 AM  If 7PM-7AM, please contact night-coverage [email or web address]

## 2020-11-06 NOTE — Progress Notes (Signed)
Referring Physician(s): Candace Jones  Supervising Physician: Candace Jones  Patient Status:  [name] - In-pt  Chief Complaint:  Recurrent pelvic abscess  Subjective: Pt known to IR service from image guided drainage of perirectal abscess on 10/07/20; cultures were negative.  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 She was last seen by our service on 10/17/20 and CT at that time revealed resolution of drained collection and drain was subsequently removed. She now presents to [facility] with recent abd/back pain,N/V, hematuria/dysuria and f/u CT A/P revealing:  1. Evaluation is complicated by an absence of intravenous contrast media, extensive respiratory motion artifact and a paucity of intraperitoneal fat, all of which may contribute to limited detection of subtle abnormalities. 2. Reaccumulation of the thick-walled collection along the right pelvic floor following removal of a drainage catheter seen on comparison CT imaging concerning for recurrent perirectal abscess. 3. Markedly enlarged, heterogeneous uterus with multiple dural mass is suspicious for uterine fibroids though underlying leiomyosarcoma is not fully excluded. Findings are better detailed on comparison MR imaging.  4. Persistent and slightly increased mild to moderate hydroureteronephrosis at least to the level of the markedly enlarged uterus, possibly secondary to mass effect. Can certainly result in some diminished renal function. Increased attenuation dependently within the collecting systems could reflect excreted contrast media versus milk of calcium or urolithiasis with motion artifact. 5. Hypoattenuation of the cardiac blood pool, often reflective of anemia. 6. Trace pericardial effusion  She is afebrile, WBC 3.8, hgb 7.1, plts 186k, creat 4.4, COVID 19 neg; request now received for pelvic abscess drain replacement.  Past Medical History:   Diagnosis Date  . AIDS (acquired immune deficiency syndrome) ([place])   . 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 APPLICATION OF CRANIAL NAVIGATION Left 10/02/2020   Procedure: APPLICATION OF CRANIAL NAVIGATION;  Surgeon: Newman Pies, MD;  Location: [place];  Service: Neurosurgery;  Laterality: Left;  . CRANIOTOMY Left 10/02/2020   Procedure: LEFT CRANIOTOMY FOR TUMOR BIOPSY/ RESECTION with [facility];  Surgeon: Newman Pies, MD;  Location: [place];  Service: Neurosurgery;  Laterality: Left;      Allergies: Patient has no known allergies.  Medications: Prior to Admission medications   Medication Sig Start Date End Date Taking? Authorizing Provider  bictegravir-emtricitabine-tenofovir AF (BIKTARVY) 50-200-25 MG TABS tablet Take 1 tablet by mouth daily. 10/17/20  Yes [place] Callas, NP  docusate sodium (COLACE) 100 MG capsule Take 1 capsule (100 mg total) by mouth 2 (two) times daily. 10/10/20  Yes Bonnielee Haff, MD  ferrous sulfate 325 (65 FE) MG tablet Take 1 tablet (325 mg total) by mouth daily with breakfast. 10/10/20 01/08/21 Yes [place] Donnamarie Poag, DO  ibuprofen (ADVIL) 200 MG tablet Take 400 mg by mouth every 6 (six) hours as needed for headache or moderate pain.   Yes [provider]  levETIRAcetam (KEPPRA) 750 MG tablet Take 1 tablet (750 mg total) by mouth 2 (two) times daily. 10/31/20 01/29/21 Yes [place] Callas, NP  promethazine (PHENERGAN) 12.5 MG tablet Take 1 tablet (12.5 mg total) by mouth every 6 (six) hours as needed for nausea or vomiting. 10/31/20  Yes [place]uppelweiser, Cassie L, RPH-CPP  sulfaDIAZINE 500 MG tablet Take 3 tablets (1,500 mg total) by mouth 4 (four) times daily for 12 days. 11/07/20 11/19/20 Yes [place] Callas, NP  leucovorin (WELLCOVORIN) 25 MG tablet Take 1 tablet (25 mg total) by mouth  daily. 11/06/20   Kuppelweiser, Cassie L, RPH-CPP  omeprazole (PRILOSEC) 20 MG capsule Take 1 capsule  (20 mg total) by mouth daily. 11/06/20   Kuppelweiser, Cassie L, RPH-CPP  pyrimethamine (DARAPRIM) 25 MG tablet Take 3 tablets (75 mg total) by mouth daily with breakfast. 11/07/20   Kuppelweiser, Cassie L, RPH-CPP     Vital Signs: BP 138/90 (BP Location: Left Arm)   Pulse 77   Temp 98.2 F (36.8 C) (Oral)   Resp 20   SpO2 100%   Physical Exam awake/alert; chest- CTA bilat; heart- RRR; abd- soft,+BS, currently NT; no LE edema  Imaging: CT ABDOMEN PELVIS WO CONTRAST  Result Date: 11/05/2020 CLINICAL DATA:  Back and flank pain, renal failure, nausea and vomiting for 2 days, dysuria and EXAM: CT ABDOMEN AND PELVIS WITHOUT CONTRAST TECHNIQUE: Multidetector CT imaging of the abdomen and pelvis was performed following the standard protocol without IV contrast. COMPARISON:  CT 10/17/2020 MR 10/02/2020 FINDINGS: Motion degradation of the lung bases and upper abdomen likely related to respiratory motion artifact. Lower chest: Lung bases appear grossly clear. Borderline cardiomegaly. Hypoattenuation of the cardiac blood pool often reflective of anemia. Trace pericardial effusion. Hepatobiliary: No visible focal liver lesion. Smooth liver surface contour. Normal hepatic attenuation. Gallbladder largely decompressed at the time of exam. Gross gallbladder abnormality is seen. No visible calcified gallstones. No discernible biliary ductal dilatation though evaluation limited upper motion artifact. Pancreas: No discernible pancreatic mass or lesion. No peripancreatic stranding. No visible ductal dilatation. Spleen: Normal in size. No concerning splenic lesions. Adrenals/Urinary Tract: Adrenal glands are difficult to fully discern in the absence of contrast media given extensive motion artifact. Some layering hyperdensity is seen within the proximal collecting system bilaterally. Given symmetry of this finding, could reflect excreted contrast media versus is milk of calcium. A relatively brush like appearance argues  against discrete urolithiasis though may be accentuated by motion artifact. Mild to moderate bilateral hydroureteronephrosis. Visible distal ureteral calculi. Urinary bladder is largely decompressed at the time of exam and therefore poorly evaluated by CT imaging. Stomach/Bowel: Challenging assessment the bowel in the absence of contrast media or enteric contrast. No gross acute abnormality is seen of the distal thoracic esophagus, stomach or duodenum. Multiple fluid-filled loops of small bowel are present without gross distention. No discernible bowel wall thickening. Moderate to large colonic stool burden. Discrete colonic wall thickening dilatation or dilatation. No CT features of high-grade obstruction. Vascular/Lymphatic: Limited assessment of the vasculature in the absence of contrast media no aneurysm or ectasia is evident. Numerous pelvic phleboliths. Reproductive: Markedly enlarged, heterogeneous uterus compatible the numerous large fibroid seen on comparison CT and MR imaging, better characterized on those exams. No gross interval change from comparison prior. No discrete uterine masses are observable within the limitations of this unenhanced exam. Other: Small fluid attenuation along the right pelvic floor appears to have reaccumulated in the interim from comparison prior CT following removal of a drainage catheter on the CT images dated 10/17/2020. This collection measures 4.4 x 2.2 x 4.3 cm. Some mild surrounding stranding is nonspecific. Musculoskeletal: No acute osseous abnormality or suspicious osseous lesion. Stable chronic angulation of the sacrococcygeal junction. 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 Minimal degenerative changes the spine, hips pelvis. IMPRESSION: 1. Evaluation is complicated by an absence of intravenous contrast media, extensive respiratory motion artifact and a paucity of intraperitoneal fat, all of which may contribute to limited detection of subtle abnormalities. 2.  Reaccumulation of the thick-walled collection along the right pelvic floor  following removal of a drainage catheter seen on comparison CT imaging concerning for recurrent perirectal abscess. 3. Markedly enlarged, heterogeneous uterus with multiple dural mass is suspicious for uterine fibroids though underlying leiomyosarcoma is not fully excluded. Findings are better detailed on comparison MR imaging. 4. Persistent and slightly increased mild to moderate hydroureteronephrosis at least to the level of the markedly enlarged uterus, possibly secondary to mass effect. Can certainly result in some diminished renal function. Increased attenuation dependently within the collecting systems could reflect excreted contrast media versus milk of calcium or urolithiasis with motion artifact. 5. Hypoattenuation of the cardiac blood pool, often reflective of anemia. 6. Trace pericardial effusion. Electronically Signed   By: Lovena Le M.D.   On: 11/05/2020 22:19    Labs:  CBC: Recent Labs    10/13/20 1622 10/31/20 1126 11/05/20 1725 11/06/20 0208  WBC 9.7 2.6* 3.3* 3.8*  HGB 9.2* 7.8* 8.2* 7.1*  HCT 31.8* 26.1* 27.3* 23.8*  PLT 169 265 204 186    COAGS: Recent Labs    09/29/20 1814  INR 1.1  APTT 23*    BMP: Recent Labs    10/08/20 0415 10/13/20 1622 10/31/20 1126 11/05/20 1725 11/06/20 0208  NA 133* 135 134* 135 138  K 3.8 3.8 5.1 5.2* 4.2  CL 103 103 103 107 109  CO2 23 23 20  21* 22  GLUCOSE 100* 93 84 100* 110*  BUN 14 15 42* 26* 28*  CALCIUM 8.9 8.8* 8.8 8.7* 8.8*  CREATININE 0.71 1.62* 5.87* 4.14* 4.40*  GFRNONAA >60 41*  --  13* 12*    LIVER FUNCTION TESTS: Recent Labs    09/29/20 1814 09/30/20 0555 10/13/20 1622 10/31/20 1126 11/05/20 1725  BILITOT 0.6 0.7 0.3 0.3 0.2*  AST 41 33 34 35* 50*  ALT 27 24 40 39* 54*  ALKPHOS 66 60 56  --  64  PROT 9.7* 9.0* 8.2* 7.2 7.5  ALBUMIN 3.6 3.2* 3.5  --  3.4*    Assessment and Plan: Pt known to IR service from image guided  drainage of perirectal abscess on 10/07/20; cultures were negative.  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 She was last seen by our service on 10/17/20 and CT at that time revealed resolution of drained collection and drain was subsequently removed. She now presents to [facility] with recent abd/back pain,N/V, hematuria/dysuria and f/u CT A/P revealing:  1. Evaluation is complicated by an absence of intravenous contrast media, extensive respiratory motion artifact and a paucity of intraperitoneal fat, all of which may contribute to limited detection of subtle abnormalities. 2. Reaccumulation of the thick-walled collection along the right pelvic floor following removal of a drainage catheter seen on comparison CT imaging concerning for recurrent perirectal abscess. 3. Markedly enlarged, heterogeneous uterus with multiple dural mass is suspicious for uterine fibroids though underlying leiomyosarcoma is not fully excluded. Findings are better detailed on comparison MR imaging.  4. Persistent and slightly increased mild to moderate hydroureteronephrosis at least to the level of the markedly enlarged uterus, possibly secondary to mass effect. Can certainly result in some diminished renal function. Increased attenuation dependently within the collecting systems could reflect excreted contrast media versus milk of calcium or urolithiasis with motion artifact. 5. Hypoattenuation of the cardiac blood pool, often reflective of anemia. 6. Trace pericardial effusion  She is afebrile, WBC 3.8, hgb 7.1, plts 186k, creat 4.4, COVID 19 neg; request now received for pelvic abscess drain  replacement.Imaging studies have been reviewed by Dr. Dwaine Gale.Risks and benefits discussed with the patient/sister via interpreter including bleeding, infection, damage to adjacent structures, bowel perforation/fistula connection, and sepsis.  All of the  patient's questions were answered, patient is agreeable to proceed. Consent signed and in chart.  Procedure scheduled for 5/27     Electronically Signed: D. Rowe Robert, PA-C 11/06/2020, 3:54 PM   I spent a total of 25 minutes at the the patient's bedside AND on the patient's hospital floor or unit, greater than 50% of which was counseling/coordinating care for CT guided drainage of pelvic abscess    Patient ID: Candace Jones, female   DOB: June 25, 1980, 40 y.o.   MRN: [id number]

## 2020-11-06 NOTE — Consult Note (Signed)
[place] November 21, 1980  [id number].    Requesting MD: Dr. 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  She was started on abx therapy for this.  She was also noted to have a pelvic floor fluid collection that was drained by IR with a perc drain.  We were not involved in her care at that time.  Her cultures from this appear sterile with no organisms seen.  She has this in for a couple of weeks.  It apparently retracted some and follow up imaging revealed a resolved fluid collection.  The drain was removed on 5/6.  She did not have symptoms from this fluid collection. She still does not.  She is here in the ED today secondary to ARF from her abx regimen.  She denies any rectal pain, trouble moving her bowels, abdominal pain, etc.  Her A/P was rescanned today secondary to dysuria and left flank pain.  The scan revealed a recurrence of this fluid collection which is very high and not a visible perirectal area.  Once again, she is asymptomatic from this.  We have been asked to see her for further evaluation and recommendations.  ROS: ROS: Please see HPI, otherwise c/o HA and all other systems are negative  Family History  Family history unknown: Yes    Past Medical History:  Diagnosis Date  . AIDS (acquired immune deficiency syndrome) ([place])   . 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 APPLICATION OF CRANIAL NAVIGATION Left 10/02/2020   Procedure: APPLICATION OF CRANIAL NAVIGATION;  Surgeon: Newman Pies, MD;  Location: [facility];  Service: Neurosurgery;  Laterality: Left;  . CRANIOTOMY Left 10/02/2020   Procedure: LEFT CRANIOTOMY FOR TUMOR BIOPSY/ RESECTION with BrainLab;  Surgeon: Newman Pies, MD;  Location: [facility];  Service: Neurosurgery;  Laterality: Left;    Social History:  reports that she has never smoked. She has never used smokeless tobacco. She reports that she does not drink alcohol and does not use drugs.  Allergies: No Known Allergies  Medications Prior to Admission  Medication Sig Dispense Refill  . bictegravir-emtricitabine-tenofovir AF (BIKTARVY) 50-200-25 MG TABS tablet Take 1 tablet by mouth daily. 30 tablet 5  . docusate sodium (COLACE) 100 MG capsule Take 1 capsule (100 mg total) by mouth 2 (two) times daily. 30 capsule 0  . ferrous sulfate 325 (65 FE) MG tablet Take 1 tablet (325 mg total) by mouth daily with breakfast. 90 tablet 0  . ibuprofen (ADVIL) 200 MG tablet Take 400 mg by mouth every 6 (six) hours as needed for headache or moderate pain.    Marland Kitchen levETIRAcetam (KEPPRA) 750 MG tablet Take 1 tablet (750 mg total) by mouth 2 (two) times daily. 60 tablet 2  . promethazine (PHENERGAN) 12.5 MG tablet Take 1 tablet (12.5 mg total) by mouth every 6 (six) hours as needed for nausea or vomiting. 60 tablet 1  . [START ON 11/07/2020] sulfaDIAZINE 500 MG tablet Take 3 tablets (1,500 mg total) by mouth 4 (four) times daily for 12 days. 144 tablet 0  . leucovorin (WELLCOVORIN) 25 MG tablet Take 1 tablet (25 mg total) by mouth daily. 30 tablet 0  . omeprazole (PRILOSEC) 20 MG capsule Take 1 capsule (20 mg  total) by mouth daily. 30 capsule 0  . [START ON 11/07/2020] pyrimethamine (DARAPRIM) 25 MG tablet Take 3 tablets (75 mg total) by mouth daily with breakfast. 90 tablet 0     Physical Exam: Blood pressure 138/90, pulse 77, temperature 98.2 F (36.8 C), temperature source Oral, resp. rate 20, SpO2 100 %. General: pleasant, WD, WN black female who is laying in bed in NAD HEENT: head is normocephalic, atraumatic with a healing scar on the left temporal side.  Sclera are noninjected.  PERRL.  Ears and nose without any masses or lesions.  Mouth is pink and  moist Heart: regular, rate, and rhythm.  Normal s1,s2. No obvious murmurs, gallops, or rubs noted.  Palpable radial and pedal pulses bilaterally Lungs: CTAB, no wheezes, rhonchi, or rales noted.  Respiratory effort nonlabored Abd: soft, NT, ND, +BS, no masses, hernias, but her uterus is palpable in her lower abdomen GU: normal rectum.  No pain at all to palpation in her buttocks or perirectal area.  No induration, erythema, or signs of infection Neuro: Cranial nerves 2-12 grossly intact, sensation is normal throughout Psych: A&Ox3 with an appropriate affect.   Results for orders placed or performed during the hospital encounter of 11/05/20 (from the past 48 hour(s))  Comprehensive metabolic panel     Status: Abnormal   Collection Time: 11/05/20  5:25 PM  Result Value Ref Range   Sodium 135 135 - 145 mmol/L   Potassium 5.2 (H) 3.5 - 5.1 mmol/L   Chloride 107 98 - 111 mmol/L   CO2 21 (L) 22 - 32 mmol/L   Glucose, Bld 100 (H) 70 - 99 mg/dL    Comment: Glucose reference range applies only to samples taken after fasting for at least 8 hours.   BUN 26 (H) 6 - 20 mg/dL   Creatinine, Ser 4.14 (H) 0.44 - 1.00 mg/dL   Calcium 8.7 (L) 8.9 - 10.3 mg/dL   Total Protein 7.5 6.5 - 8.1 g/dL   Albumin 3.4 (L) 3.5 - 5.0 g/dL   AST 50 (H) 15 - 41 U/L   ALT 54 (H) 0 - 44 U/L   Alkaline Phosphatase 64 38 - 126 U/L   Total Bilirubin 0.2 (L) 0.3 - 1.2 mg/dL   GFR, Estimated 13 (L) >60 mL/min    Comment: (NOTE) Calculated using the CKD-EPI Creatinine Equation (2021)    Anion gap 7 5 - 15    Comment: Performed at [facility], [place] [address] [place], [place] [address]  Lipase, blood     Status: None   Collection Time: 11/05/20  5:25 PM  Result Value Ref Range   Lipase 34 11 - 51 U/L    Comment: Performed at [facility] [address], [place], [place] [address]  CBC with Differential     Status: Abnormal   Collection Time: 11/05/20  5:25 PM  Result Value Ref Range   WBC 3.3 (L) 4.0 -  10.5 K/uL   RBC 3.50 (L) 3.87 - 5.11 MIL/uL   Hemoglobin 8.2 (L) 12.0 - 15.0 g/dL   HCT 27.3 (L) 36.0 - 46.0 %   MCV 78.0 (L) 80.0 - 100.0 fL   MCH 23.4 (L) 26.0 - 34.0 pg   MCHC 30.0 30.0 - 36.0 g/dL   RDW Not Measured 11.5 - 15.5 %   Platelets 204 150 - 400 K/uL   nRBC 0.0 0.0 - 0.2 %   Neutrophils Relative % 58 %   Neutro Abs 1.9  1.7 - 7.7 K/uL   Lymphocytes Relative 24 %   Lymphs Abs 0.8 0.7 - 4.0 K/uL   Monocytes Relative 12 %   Monocytes Absolute 0.4 0.1 - 1.0 K/uL   Eosinophils Relative 4 %   Eosinophils Absolute 0.1 0.0 - 0.5 K/uL   Basophils Relative 1 %   Basophils Absolute 0.0 0.0 - 0.1 K/uL   Immature Granulocytes 1 %   Abs Immature Granulocytes 0.03 0.00 - 0.07 K/uL   Burr Cells PRESENT    Ovalocytes PRESENT     Comment: Performed at [facility], [place] [address], [place], [place] [address]  Urinalysis, Routine w reflex microscopic Urine, Clean Catch     Status: Abnormal   Collection Time: 11/05/20  5:25 PM  Result Value Ref Range   Color, Urine RED (A) YELLOW    Comment: BIOCHEMICALS MAY BE AFFECTED BY COLOR   APPearance TURBID (A) CLEAR   Specific Gravity, Urine  1.005 - 1.030    TEST NOT REPORTED DUE TO COLOR INTERFERENCE OF URINE PIGMENT   pH  5.0 - 8.0    TEST NOT REPORTED DUE TO COLOR INTERFERENCE OF URINE PIGMENT   Glucose, UA (A) NEGATIVE mg/dL    TEST NOT REPORTED DUE TO COLOR INTERFERENCE OF URINE PIGMENT   Hgb urine dipstick (A) NEGATIVE    TEST NOT REPORTED DUE TO COLOR INTERFERENCE OF URINE PIGMENT   Bilirubin Urine (A) NEGATIVE    TEST NOT REPORTED DUE TO COLOR INTERFERENCE OF URINE PIGMENT   Ketones, ur (A) NEGATIVE mg/dL    TEST NOT REPORTED DUE TO COLOR INTERFERENCE OF URINE PIGMENT   Protein, ur (A) NEGATIVE mg/dL    TEST NOT REPORTED DUE TO COLOR INTERFERENCE OF URINE PIGMENT   Nitrite (A) NEGATIVE    TEST NOT REPORTED DUE TO COLOR INTERFERENCE OF URINE PIGMENT   Leukocytes,Ua (A) NEGATIVE    TEST NOT REPORTED DUE TO COLOR  INTERFERENCE OF URINE PIGMENT    Comment: Performed at [facility], [place] [address], [place], [place] [address]  Urinalysis, Microscopic (reflex)     Status: Abnormal   Collection Time: 11/05/20  5:25 PM  Result Value Ref Range   RBC / HPF >50 0 - 5 RBC/hpf   WBC, UA 6-10 0 - 5 WBC/hpf   Bacteria, UA RARE (A) NONE SEEN   Squamous Epithelial / LPF 0-5 0 - 5   Non Squamous Epithelial PRESENT (A) NONE SEEN    Comment: Performed at [facility], [place] [address] [place], [place] [address]  I-Stat beta hCG blood, ED     Status: None   Collection Time: 11/05/20  6:18 PM  Result Value Ref Range   I-stat hCG, quantitative <5.0 <5 mIU/mL   Comment 3            Comment:   GEST. AGE      CONC.  (mIU/mL)   <=1 WEEK        5 - 50     2 WEEKS       50 - 500     3 WEEKS       100 - 10,000     4 WEEKS     1,000 - 30,000        FEMALE AND NON-PREGNANT FEMALE:     LESS THAN 5 mIU/mL   SARS CORONAVIRUS 2 (TAT 6-24 HRS) Nasopharyngeal Nasopharyngeal Swab     Status: None   Collection Time: 11/06/20 12:14  AM   Specimen: Nasopharyngeal Swab  Result Value Ref Range   SARS Coronavirus 2 NEGATIVE NEGATIVE    Comment: (NOTE) SARS-CoV-2 target nucleic acids are NOT DETECTED.  The SARS-CoV-2 RNA is generally detectable in upper and lower respiratory specimens during the acute phase of infection. Negative results do not preclude SARS-CoV-2 infection, do not rule out co-infections with other pathogens, and should not be used as the sole basis for treatment or other patient management decisions. Negative results must be combined with clinical observations, patient history, and epidemiological information. The expected result is Negative.  Fact Sheet for Patients: SugarRoll.be  Fact Sheet for Healthcare Providers: https://www.woods-mathews.com/  This test is not yet approved or cleared by the [place] FDA and  has been authorized for detection  and/or diagnosis of SARS-CoV-2 by FDA under an Emergency Use Authorization (EUA). This EUA will remain  in effect (meaning this test can be used) for the duration of the COVID-19 declaration under Se ction 564(b)(1) of the Act, 21 U.S.C. section 360bbb-3(b)(1), unless the authorization is terminated or revoked sooner.  Performed at [facility], [place] [address], [place], [place] [address]   CBC     Status: Abnormal   Collection Time: 11/06/20  2:08 AM  Result Value Ref Range   WBC 3.8 (L) 4.0 - 10.5 K/uL   RBC 3.05 (L) 3.87 - 5.11 MIL/uL   Hemoglobin 7.1 (L) 12.0 - 15.0 g/dL    Comment: Reticulocyte Hemoglobin testing may be clinically indicated, consider ordering this additional test [id number]    HCT 23.8 (L) 36.0 - 46.0 %   MCV 78.0 (L) 80.0 - 100.0 fL   MCH 23.3 (L) 26.0 - 34.0 pg   MCHC 29.8 (L) 30.0 - 36.0 g/dL   RDW Not Measured 11.5 - 15.5 %   Platelets 186 150 - 400 K/uL    Comment: REPEATED TO VERIFY   nRBC 0.0 0.0 - 0.2 %    Comment: Performed at [facility], [place] [address] [place], [place] [address]  Basic metabolic panel     Status: Abnormal   Collection Time: 11/06/20  2:08 AM  Result Value Ref Range   Sodium 138 135 - 145 mmol/L   Potassium 4.2 3.5 - 5.1 mmol/L   Chloride 109 98 - 111 mmol/L   CO2 22 22 - 32 mmol/L   Glucose, Bld 110 (H) 70 - 99 mg/dL    Comment: Glucose reference range applies only to samples taken after fasting for at least 8 hours.   BUN 28 (H) 6 - 20 mg/dL   Creatinine, Ser 4.40 (H) 0.44 - 1.00 mg/dL   Calcium 8.8 (L) 8.9 - 10.3 mg/dL   GFR, Estimated 12 (L) >60 mL/min    Comment: (NOTE) Calculated using the CKD-EPI Creatinine Equation (2021)    Anion gap 7 5 - 15    Comment: Performed at [facility] [address], [place],  [address]  Urinalysis, Routine w reflex microscopic     Status: Abnormal   Collection Time: 11/06/20  3:30 PM  Result Value Ref Range   Color, Urine STRAW (A) YELLOW   APPearance  HAZY (A) CLEAR   Specific Gravity, Urine 1.008 1.005 - 1.030   pH 6.0 5.0 - 8.0   Glucose, UA NEGATIVE NEGATIVE mg/dL   Hgb urine dipstick LARGE (A) NEGATIVE   Bilirubin Urine NEGATIVE NEGATIVE   Ketones, ur NEGATIVE NEGATIVE mg/dL   Protein, ur NEGATIVE NEGATIVE mg/dL  Nitrite NEGATIVE NEGATIVE   Leukocytes,Ua LARGE (A) NEGATIVE   RBC / HPF 0-5 0 - 5 RBC/hpf   WBC, UA 11-20 0 - 5 WBC/hpf   Bacteria, UA RARE (A) NONE SEEN   Squamous Epithelial / LPF 0-5 0 - 5   Mucus PRESENT     Comment: Performed at [facility], [place] [address] [place], [place] [address]   CT ABDOMEN PELVIS WO CONTRAST  Result Date: 11/05/2020 CLINICAL DATA:  Back and flank pain, renal failure, nausea and vomiting for 2 days, dysuria and EXAM: CT ABDOMEN AND PELVIS WITHOUT CONTRAST TECHNIQUE: Multidetector CT imaging of the abdomen and pelvis was performed following the standard protocol without IV contrast. COMPARISON:  CT 10/17/2020 MR 10/02/2020 FINDINGS: Motion degradation of the lung bases and upper abdomen likely related to respiratory motion artifact. Lower chest: Lung bases appear grossly clear. Borderline cardiomegaly. Hypoattenuation of the cardiac blood pool often reflective of anemia. Trace pericardial effusion. Hepatobiliary: No visible focal liver lesion. Smooth liver surface contour. Normal hepatic attenuation. Gallbladder largely decompressed at the time of exam. Gross gallbladder abnormality is seen. No visible calcified gallstones. No discernible biliary ductal dilatation though evaluation limited upper motion artifact. Pancreas: No discernible pancreatic mass or lesion. No peripancreatic stranding. No visible ductal dilatation. Spleen: Normal in size. No concerning splenic lesions. Adrenals/Urinary Tract: Adrenal glands are difficult to fully discern in the absence of contrast media given extensive motion artifact. Some layering hyperdensity is seen within the proximal collecting system bilaterally.  Given symmetry of this finding, could reflect excreted contrast media versus is milk of calcium. A relatively brush like appearance argues against discrete urolithiasis though may be accentuated by motion artifact. Mild to moderate bilateral hydroureteronephrosis. Visible distal ureteral calculi. Urinary bladder is largely decompressed at the time of exam and therefore poorly evaluated by CT imaging. Stomach/Bowel: Challenging assessment the bowel in the absence of contrast media or enteric contrast. No gross acute abnormality is seen of the distal thoracic esophagus, stomach or duodenum. Multiple fluid-filled loops of small bowel are present without gross distention. No discernible bowel wall thickening. Moderate to large colonic stool burden. Discrete colonic wall thickening dilatation or dilatation. No CT features of high-grade obstruction. Vascular/Lymphatic: Limited assessment of the vasculature in the absence of contrast media no aneurysm or ectasia is evident. Numerous pelvic phleboliths. Reproductive: Markedly enlarged, heterogeneous uterus compatible the numerous large fibroid seen on comparison CT and MR imaging, better characterized on those exams. No gross interval change from comparison prior. No discrete uterine masses are observable within the limitations of this unenhanced exam. Other: Small fluid attenuation along the right pelvic floor appears to have reaccumulated in the interim from comparison prior CT following removal of a drainage catheter on the CT images dated 10/17/2020. This collection measures 4.4 x 2.2 x 4.3 cm. Some mild surrounding stranding is nonspecific. Musculoskeletal: No acute osseous abnormality or suspicious osseous lesion. Stable chronic angulation of the sacrococcygeal junction. 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 Minimal degenerative changes the spine, hips pelvis. IMPRESSION: 1. Evaluation is complicated by an absence of intravenous contrast media, extensive  respiratory motion artifact and a paucity of intraperitoneal fat, all of which may contribute to limited detection of subtle abnormalities. 2. Reaccumulation of the thick-walled collection along the right pelvic floor following removal of a drainage catheter seen on comparison CT imaging concerning for recurrent perirectal abscess. 3. Markedly enlarged, heterogeneous uterus with multiple dural mass is suspicious for uterine fibroids though underlying leiomyosarcoma is not fully  excluded. Findings are better detailed on comparison MR imaging. 4. Persistent and slightly increased mild to moderate hydroureteronephrosis at least to the level of the markedly enlarged uterus, possibly secondary to mass effect. Can certainly result in some diminished renal function. Increased attenuation dependently within the collecting systems could reflect excreted contrast media versus milk of calcium or urolithiasis with motion artifact. 5. Hypoattenuation of the cardiac blood pool, often reflective of anemia. 6. Trace pericardial effusion. Electronically Signed   By: Lovena Le M.D.   On: 11/05/2020 22:19      Assessment/Plan MMP  Pelvic floor fluid collection This fluid collection is not bothering the patient.  It was drained several weeks ago and the drain was removed on 5/6 but appears to have recurred.  The culture appears sterile, but she is on a lot of abx therapy and she is immunocompromised.  This is too high to see or feel on external exam and would likely benefit from replacement of IR drain.  This was discussed with the patient and her family member who was present via an interpreter.  They understand and agree to proceed.   Henreitta Cea, [facility] Surgery 11/06/2020, 4:31 PM Please see Amion for pager number during day hours 7:00am-4:30pm or 7:00am -11:30am on weekends

## 2020-11-06 NOTE — ED Notes (Signed)
Pt provided with turkey sandwich and water

## 2020-11-06 NOTE — Consult Note (Signed)
[facility]    Date of Admission:  11/05/2020    Total days of antibiotics                Reason for Consult: HIV/AIDS, toxoplasmosis, AKI     Referring Provider: [facility]  Primary Care Provider: Patient, No Pcp Per (Inactive)    Assessment: Candace Jones is a 40 y.o. 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 has had ongoing abdominal pain, heartburn and nausea related to likely the sulfadiazine. Will plan to switch this out for clindamycin QID as alternate option for treatment to complete the last 12(approx) days of treatment. Will hold on this switch until after her drain is replaced so we can get a higher yield of possible causative organism there (previously did not grow after she was on bactrim therapy).   HIV/AIDS = holding biktarvy. Likely will change to dovato for TAF sparing regimen as below. If we change sulfadiazine tomorrow will need to add dapsone for PJP prophylaxis. VL in AM.   AKI with Creatinine > 5 in the setting of epigastric/abdominal pain, new ARV, decreased oral intake, NSAID use and what seems to be obstructive pathology from mass effect of her uterus. Low suspicion that the tenofovir is the sole cause for this abrupt change. Nonetheless contraindicated with current CrCl and don't want to cloud picture for working this up. Will plan to change to dovato and spare of tenofovir. Repeat VL for therapeutic response. Given mass effect of uterus would recommend GYN surgery see her.   Recurrent perirectal abscess = surgery has seen and recommended repeat percutaneous drain for control. Please send for routine culture and AFB cultures.   Headaches - likely d/t Biktarvy side effect but hard to completely exclude that CNS lesions have not changed especially if she is having some  vomiting with medications. Will probably need to MR her head to follow up.    Blurry vision - I looked back at the last hospitalization and don't see where she was seen by ophthalmology. She could have occular involvement of toxoplasmosis, which I think may warrant some systemic steroids. Consultation with ophthalmology would be recommended for guidance.    Plan: 1. Continue pyrimethamine, leucovorin and sulfadiazine for now 2. Hold biktarvy  3. IR to drain abscess - please send for routine cultures / AFB cultures 4. After #3 change sulfadiazine to clindamycin QID as part of ongoing toxo tx  5. Will plan to reintroduce TAF sparing ARV soon  6. Ophthalmology consult for concern over occular toxo  7. MR of the brain to ensure no progression of CNS disease (given headaches and blurry vision)  8. Would recommend GYN surgery given obstructive nature of uterus and worsening hydronephrosis.    Principal Problem:   AKI (acute kidney injury) ([place]) Active Problems:   AIDS (acquired immune deficiency syndrome) ([facility])   Microcytic hypochromic anemia   Perirectal abscess   Hyperkalemia   . docusate sodium  100 mg Oral BID  . enoxaparin (LOVENOX) injection  30 mg Subcutaneous Q24H  . ferrous sulfate  325 mg Oral Q breakfast  . leucovorin  25 mg Oral Daily  . levETIRAcetam  750 mg Oral BID  . pantoprazole  40 mg Oral QHS  . [START ON 11/07/2020] pyrimethamine  75 mg  Oral Q breakfast  . sulfaDIAZINE  1,500 mg Oral QID  . sulfaDIAZINE  1,500 mg Oral Once  . sulfaDIAZINE  1,500 mg Oral QID    HPI: Candace Jones is a 40 y.o. female who we sent to the hospital after significant AKI in the setting of HIV, AIDS+, CNS toxoplasmosis.   Friday 5/20 she was seeing our pharmacy team to refill her complicated pill regimen and was complaining of abdominal pain. She has had reflux and stomach pain (thought to be due to the sulfadizine regimen)   Has been having headaches due to recently introduced  biktarvy ( 2 weeks in) and treating with tylenol and more recently aleve / ibuprofen (200 mg BID). She tells me today that she has had some blurry vision that has not gotten better throughout treatment. Headaches are described to be light and can resolve on their own  Upper abdominal pain and "warmth" that causes some trouble breathing after each dose of medication that resolves on it's own, but happens frequently considering high dose frequency of her current pill regimen.   Denies any changes to bowels (aside from some constipation that has returned), but does report bloating, recent heavy menstrual period with large clots that stopped yesterday. Denies any back pain or dysuria symptoms. No fevers/chills. She thinks she has been urinating normally but not sure since she has not been eating normally in the setting of frequent nausea a/w medications.    Review of Systems: Review of Systems  Constitutional: Negative for chills, fever and weight loss.  Eyes: Positive for blurred vision.    Past Medical History:  Diagnosis Date  . AIDS (acquired immune deficiency syndrome) ([place])   . 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 Smoking status: Never Smoker  . Smokeless tobacco: Never Used  Substance Use Topics  . Alcohol use: Never  . Drug use: Never    Family History  Family history unknown: Yes   No Known Allergies  OBJECTIVE: Blood pressure 124/89, pulse 76, temperature 98.2 F (36.8 C), temperature source Oral, resp. rate 19, SpO2 100 %.  Physical Exam Constitutional:      Appearance: Normal appearance. She is not ill-appearing.  HENT:     Mouth/Throat:     Mouth: Mucous membranes are moist.     Pharynx: Oropharynx is clear.  Eyes:     General: No scleral icterus.    Conjunctiva/sclera: Conjunctivae normal.     Pupils: Pupils are equal, round, and reactive to light.  Cardiovascular:     Rate and Rhythm: Normal rate and regular  rhythm.     Pulses: Normal pulses.     Heart sounds: No murmur heard.   Pulmonary:     Effort: Pulmonary effort is normal.     Breath sounds: Normal breath sounds.  Abdominal:     General: Abdomen is flat. Bowel sounds are normal. There is distension.     Palpations: Abdomen is soft.     Tenderness: There is abdominal tenderness (epigastrid/RUQ).  Musculoskeletal:        General: Normal range of motion.  Skin:    General: Skin is warm and dry.     Capillary Refill: Capillary refill takes less than 2 seconds.  Neurological:     General: No focal deficit present.     Mental Status: She is alert and oriented to person, place, and time.     Lab Results Lab Results  Component Value Date  WBC 3.8 (L) 11/06/2020   HGB 7.1 (L) 11/06/2020   HCT 23.8 (L) 11/06/2020   MCV 78.0 (L) 11/06/2020   PLT 186 11/06/2020    Lab Results  Component Value Date   CREATININE 4.40 (H) 11/06/2020   BUN 28 (H) 11/06/2020   NA 138 11/06/2020   K 4.2 11/06/2020   CL 109 11/06/2020   CO2 22 11/06/2020    Lab Results  Component Value Date   ALT 54 (H) 11/05/2020   AST 50 (H) 11/05/2020   ALKPHOS 64 11/05/2020   BILITOT 0.2 (L) 11/05/2020     Microbiology: Recent Results (from the past 240 hour(s))  SARS CORONAVIRUS 2 (TAT 6-24 HRS) Nasopharyngeal Nasopharyngeal Swab     Status: None   Collection Time: 11/06/20 12:14 AM   Specimen: Nasopharyngeal Swab  Result Value Ref Range Status   SARS Coronavirus 2 NEGATIVE NEGATIVE Final    Comment: (NOTE) SARS-CoV-2 target nucleic acids are NOT DETECTED.  The SARS-CoV-2 RNA is generally detectable in upper and lower respiratory specimens during the acute phase of infection. Negative results do not preclude SARS-CoV-2 infection, do not rule out co-infections with other pathogens, and should not be used as the sole basis for treatment or other patient management decisions. Negative results must be combined with clinical observations, patient  history, and epidemiological information. The expected result is Negative.  Fact Sheet for Patients: SugarRoll.be  Fact Sheet for Healthcare Providers: https://www.woods-mathews.com/  This test is not yet approved or cleared by the [place] FDA and  has been authorized for detection and/or diagnosis of SARS-CoV-2 by FDA under an Emergency Use Authorization (EUA). This EUA will remain  in effect (meaning this test can be used) for the duration of the COVID-19 declaration under Se ction 564(b)(1) of the Act, 21 U.S.C. section 360bbb-3(b)(1), unless the authorization is terminated or revoked sooner.  Performed at [facility], [place] [address], [place], [place] [address]     Janene Madeira, MSN, NP-C [facility] [email or web address] Pager: [phone] Office: [place]: [phone]

## 2020-11-06 NOTE — Telephone Encounter (Signed)
Patient was set to see me tomorrow for a pill box fill with her medications. She is currently hospitalized. I was able to pick up her leucovorin, sulfadiazine, keppra, and omeprazole at [facility]. They were only able to give me a partial fill of the sulfadiazine (3 days worth) and will order more for tomorrow.   Delivered medications to inpatient ID pharmacist, Dorothea Ogle. He will work with patient and sister to refill her pill box before discharge. Patient's sister will go pick up remaining supply of sulfadiazine tomorrow at [facility]. She should have enough pyrimethamine, iron, docusate, and Biktarvy (if Biktarvy is to be restarted after discharge) on hand.

## 2020-11-07 ENCOUNTER — Ambulatory Visit: Payer: Self-pay | Admitting: Pharmacist

## 2020-11-07 ENCOUNTER — Inpatient Hospital Stay (HOSPITAL_COMMUNITY): Payer: Medicaid Other

## 2020-11-07 LAB — CBC
HCT: 23.5 % — ABNORMAL LOW (ref 36.0–46.0)
Hemoglobin: 7.3 g/dL — ABNORMAL LOW (ref 12.0–15.0)
MCH: 23.3 pg — ABNORMAL LOW (ref 26.0–34.0)
MCHC: 31.1 g/dL (ref 30.0–36.0)
MCV: 75.1 fL — ABNORMAL LOW (ref 80.0–100.0)
Platelets: 193 10*3/uL (ref 150–400)
RBC: 3.13 MIL/uL — ABNORMAL LOW (ref 3.87–5.11)
WBC: 3.7 10*3/uL — ABNORMAL LOW (ref 4.0–10.5)
nRBC: 0 % (ref 0.0–0.2)

## 2020-11-07 LAB — PROTIME-INR
INR: 1.1 (ref 0.8–1.2)
Prothrombin Time: 13.8 seconds (ref 11.4–15.2)

## 2020-11-07 LAB — BASIC METABOLIC PANEL
Anion gap: 6 (ref 5–15)
BUN: 26 mg/dL — ABNORMAL HIGH (ref 6–20)
CO2: 22 mmol/L (ref 22–32)
Calcium: 8.5 mg/dL — ABNORMAL LOW (ref 8.9–10.3)
Chloride: 108 mmol/L (ref 98–111)
Creatinine, Ser: 3.42 mg/dL — ABNORMAL HIGH (ref 0.44–1.00)
GFR, Estimated: 17 mL/min — ABNORMAL LOW (ref >60)
Glucose, Bld: 93 mg/dL (ref 70–99)
Potassium: 4.4 mmol/L (ref 3.5–5.1)
Sodium: 136 mmol/L (ref 135–145)

## 2020-11-07 LAB — IRON AND TIBC
Iron: 54 ug/dL (ref 28–170)
Saturation Ratios: 21 % (ref 10.4–31.8)
TIBC: 253 ug/dL (ref 250–450)
UIBC: 199 ug/dL

## 2020-11-07 LAB — RETICULOCYTES
Immature Retic Fract: 9.5 % (ref 2.3–15.9)
RBC.: 3.14 MIL/uL — ABNORMAL LOW (ref 3.87–5.11)
Retic Count, Absolute: 11 10*3/uL — ABNORMAL LOW (ref 19.0–186.0)
Retic Ct Pct: <0.4 % — ABNORMAL LOW (ref 0.4–3.1)

## 2020-11-07 LAB — CD4/CD8 (T-HELPER/T-SUPPRESSOR CELL)
CD4 absolute: <35 /uL — ABNORMAL LOW (ref 400–1790)
CD4%: 4.21 % — ABNORMAL LOW (ref 33–65)
CD8 T Cell Abs: 354 /uL (ref 190–1000)
CD8tox: 66.94 % — ABNORMAL HIGH (ref 12–40)
Ratio: 0.06 — ABNORMAL LOW (ref 1.0–3.0)
Total lymphocyte count: 529 /uL — ABNORMAL LOW (ref 1000–4000)

## 2020-11-07 LAB — VITAMIN B12: Vitamin B-12: 355 pg/mL (ref 180–914)

## 2020-11-07 LAB — HIV-1 RNA QUANT-NO REFLEX-BLD
HIV 1 RNA Quant: 100 copies/mL
LOG10 HIV-1 RNA: 2 log10copy/mL

## 2020-11-07 LAB — URINE CULTURE: Culture: <10000 — AB

## 2020-11-07 LAB — FERRITIN: Ferritin: 84 ng/mL (ref 11–307)

## 2020-11-07 LAB — FOLATE: Folate: 45.2 ng/mL (ref >5.9)

## 2020-11-07 MED ORDER — MIDAZOLAM HCL 2 MG/2ML IJ SOLN
INTRAMUSCULAR | Status: AC
  Filled 2020-11-07: qty 4

## 2020-11-07 MED ORDER — SODIUM CHLORIDE 0.9% FLUSH
5.0000 mL | Freq: Three times a day (TID) | INTRAVENOUS | Status: DC
  Administered 2020-11-07 – 2020-11-15 (×15): 5 mL

## 2020-11-07 MED ORDER — FENTANYL CITRATE (PF) 100 MCG/2ML IJ SOLN
INTRAMUSCULAR | Status: AC | PRN
  Administered 2020-11-07 (×2): 50 ug via INTRAVENOUS

## 2020-11-07 MED ORDER — SULFADIAZINE 500 MG PO TABS: 1500.0000 mg | ORAL_TABLET | Freq: Four times a day (QID) | ORAL | Status: DC

## 2020-11-07 MED ORDER — LIDOCAINE HCL 1 % IJ SOLN
INTRAMUSCULAR | Status: AC
  Filled 2020-11-07: qty 20

## 2020-11-07 MED ORDER — MIDAZOLAM HCL 2 MG/2ML IJ SOLN
INTRAMUSCULAR | Status: AC | PRN
  Administered 2020-11-07 (×3): 1 mg via INTRAVENOUS

## 2020-11-07 MED ORDER — SODIUM CHLORIDE 0.9 % IV SOLN: INTRAVENOUS | Status: AC

## 2020-11-07 MED ORDER — CLINDAMYCIN HCL 300 MG PO CAPS
600.0000 mg | ORAL_CAPSULE | Freq: Four times a day (QID) | ORAL | Status: DC
  Administered 2020-11-07 – 2020-11-15 (×33): 600 mg via ORAL
  Filled 2020-11-07 (×36): qty 2

## 2020-11-07 MED ORDER — FENTANYL CITRATE (PF) 100 MCG/2ML IJ SOLN
INTRAMUSCULAR | Status: AC
  Filled 2020-11-07: qty 4

## 2020-11-07 NOTE — Progress Notes (Signed)
[facility] Progress Note  Name: Candace Jones MRN: [id number] DOB: [birth year]  Chief Complaint:  Sent for outpatient labs  Subjective:  Interviewed with aid of [place] video interpreter.  she received gentle fluids on 5/26 - NS at 75/hr x 18 hours.  Was moved to 5W.  Note surgery was consulted for a pelvic floor abscess.  No urine output is charted   Review of systems:  Denies shortness of breath or chest pain  Some nausea yesterday none today --------- Background on consult:  Candace Jones is a 40 y.o. 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  She had Cr 0.71 end of April as below then increase to 1.62 on 5/2 and to 5.87 on 5/20.  Labs here a little better but still with AKI.  She has also been using ibuprofen for headache and states taking BID.  Previously took [employer] as well.  Work-up here demonstrated an enlarged uterus with possible mass effect and slightly increased mild to moderate hydroureteronephrosis.  Also with concern for recurrent perirectal abscess.  Had hematuria but note she also reported being on her period; LMP last Sunday and her period ended on 5/25.  She has been on LR at 125 ml/hr and got 1 liter NS in the ER.  Her sister is here and supplements history.    Intake/Output Summary (Last 24 hours) at 11/07/2020 1036 Last data filed at 11/07/2020 9381 Gross per 24 hour  Intake 2459.83 ml  Output --  Net 2459.83 ml    Vitals:  Vitals:   11/06/20 1640 11/06/20 2008 11/07/20 0009 11/07/20 0536  BP: 128/90 124/81 124/86 115/80  Pulse: 86 77 76 86  Resp: 18 18 16 15   Temp: 98.5 F (36.9 C) 97.8 F (36.6 C) 97.9 F (36.6 C) 97.8 F (36.6 C)  TempSrc: Oral Oral Axillary Axillary  SpO2:  99% 100% 100%     Physical Exam:  General adult female in bed in no acute distress HEENT normocephalic atraumatic extraocular movements intact sclera anicteric Neck supple  trachea midline Lungs clear to auscultation bilaterally normal work of breathing at rest  Heart S1S2 no rub Abdomen soft nontender nondistended Extremities no edema  Psych normal mood and affect Neuro alert and oriented x 3 provides hx and follows commands  Medications reviewed   Labs:  BMP Latest Ref Rng & Units 11/07/2020 11/06/2020 11/05/2020  Glucose 70 - 99 mg/dL 93 110(H) 100(H)  BUN 6 - 20 mg/dL 26(H) 28(H) 26(H)  Creatinine 0.44 - 1.00 mg/dL 3.42(H) 4.40(H) 4.14(H)  BUN/Creat Ratio 6 - 22 (calc) - - -  Sodium 135 - 145 mmol/L 136 138 135  Potassium 3.5 - 5.1 mmol/L 4.4 4.2 5.2(H)  Chloride 98 - 111 mmol/L 108 109 107  CO2 22 - 32 mmol/L 22 22 21(L)  Calcium 8.9 - 10.3 mg/dL 8.5(L) 8.8(L) 8.7(L)     Assessment/Plan:   # AKI  - Secondary to tenofovir and cannot rule out contribution of the hydroureteronephrosis  - resume NS at 75/hr x 18 hours   - Urology as below - please do not give any compounds with tenofovir - ordered strict ins/outs  # AIDS - Would defer any compounds with tenofovir   # Mild to moderate bilateral hydroureteronephrosis  - May be from mass effect from enlarged uterus  - recommended contacting urology and GYN.  Appreciate primary team.  See that urology recommended outpatient follow-up and team appears  to be consulting GYN   # Anemia microcytic  - Would transfuse PRBC's if Hb drops further  - anticipate will need PRBC's  # Perirectal abscess - noted as concern for recurrence per imaging this admission - surgery consulted   # toxoplasma meningoencephalitis - on sulfadiazine  Candace Desanctis, MD 11/07/2020 10:36 AM

## 2020-11-07 NOTE — Progress Notes (Signed)
PROGRESS NOTE    Candace Jones  ZJQ:[id number] DOB: 1980-06-17 DOA: 11/05/2020 PCP: Patient, No Pcp Per (Inactive)   Brief Narrative: Candace Jones is a 40 y.o. female history of AIDS, toxoplasma meningoencephalitis, recent perirectal abscess. 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 She has been taking ibuprofen to help with headaches. She reports taking about 400 mg twice per day for less than one week. She ran out of ibuprofen yesterday and started taking Aleve. CT imaging with evidence of ureteral mass every and resultant mild-moderate bilateral hydronephrosis; discussed with urology who states this chronic issues likely is not accounting for AKI. Complicated by patient's AIDS status. -Nephrology recommendation: continue IV fluids -Daily BMP  Gross hematuria Unsure of etiology.  CT scan performed on admission with no evidence of nephrolithiasis. Associated bilateral flank pain that radiated down towards pelvis and is improved. Hematuria has improved. Discussed with urology who recommended outpatient urology resident clinic visit.  Acute blood loss anemia Acute on chronic anemia Patient with chronic anemia.  Previous iron panel from April 2022 significant for iron deficiency anemia.  Patient also with recent gross hematuria which likely contributed to acute component.  Baseline hemoglobin of about 8-9.  Down to 3.42 this morning.  Patient does not have continued hematuria. -Serial CBC, transfuse as needed for hemoglobin less  than 7  Perirectal abscess Previously treated with percutaneous drain placement and antibiotics in April 2022.  Complicated by AIDS diagnosis. -General surgery recommendations: IR consult for percutaneous drain  AIDS Undetectable CD4 count.  Viral load of 57,200 copies per milliliter.  Patient is on [place] as an outpatient which has been held secondary to severe renal impairment. -Infectious disease recommendations: dolutegravir and lamivudine  Uterine masses Previously diagnosed as fibroids vs leiomyosarcoma. Gynecology consulted at previous visit who recommended outpatient follow-up. -Gynecology consult for consideration of biopsy  Hyperkalemia Mildly elevated secondary to AKI. Given one dose of Lokelma.  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 Started on sulfadiazine and pyrimethamine which were continued on admission -Infectious disease recommendations: adding clindamycin and discontinued sulfadiazine   DVT prophylaxis: SCDs Code Status:   Code Status: Full Code Family Communication: Sister at bedside Disposition Plan: Discharge home likely in several days pending continued management/improvement of AKI, management of abscess, ID recommendations   Consultants:   Infectious disease  Urology  General surgery  Procedures:   None  Antimicrobials:  Sulfadiazine  Lamivudine  Dolutegravir  Clindamycin   Subjective: Interpreter: Roma Kayser [phone]  Vomited medications yesterday. No other issues overnight. No concerns today. No hematuria. Patient is having vaginal bleeding which may have skewed urinalysis results.  Objective: Vitals:   11/06/20 1640 11/06/20 2008 11/07/20 0009 11/07/20 0536  BP: 128/90 124/81 124/86 115/80  Pulse: 86 77 76 86  Resp: 18 18 16 15   Temp: 98.5 F (36.9 C) 97.8 F (36.6 C) 97.9 F (36.6 C) 97.8 F (36.6 C)  TempSrc: Oral Oral Axillary Axillary  SpO2:  99% 100% 100%    Intake/Output Summary (Last 24 hours) at  11/07/2020 1020 Last data  filed at 11/07/2020 2671 Gross per 24 hour  Intake 2459.83 ml  Output --  Net 2459.83 ml   There were no vitals filed for this visit.  Examination:  General exam: Appears calm and comfortable  Respiratory system: Clear to auscultation. Respiratory effort normal. Cardiovascular system: S1 & S2 heard, 2/6 systolic murmur Gastrointestinal system: Abdomen is nondistended, soft and nontender. No organomegaly or masses felt. Normal bowel sounds heard. Central nervous system: Alert and oriented. No focal neurological deficits. Musculoskeletal: No edema. No calf tenderness Skin: No cyanosis. No rashes Psychiatry: Judgement and insight appear normal. Mood & affect appropriate.    Data Reviewed: I have personally reviewed following labs and imaging studies  CBC Lab Results  Component Value Date   WBC 3.7 (L) 11/07/2020   RBC 3.14 (L) 11/07/2020   HGB 7.3 (L) 11/07/2020   HCT 23.5 (L) 11/07/2020   MCV 75.1 (L) 11/07/2020   MCH 23.3 (L) 11/07/2020   PLT 193 11/07/2020   MCHC 31.1 11/07/2020   RDW Not Measured 11/07/2020   LYMPHSABS 0.8 11/05/2020   MONOABS 0.4 11/05/2020   EOSABS 0.1 11/05/2020   BASOSABS 0.0 24/58/0998     Last metabolic panel Lab Results  Component Value Date   NA 136 11/07/2020   K 4.4 11/07/2020   CL 108 11/07/2020   CO2 22 11/07/2020   BUN 26 (H) 11/07/2020   CREATININE 3.42 (H) 11/07/2020   GLUCOSE 93 11/07/2020   GFRNONAA 17 (L) 11/07/2020   CALCIUM 8.5 (L) 11/07/2020   PHOS 3.6 10/01/2020   PROT 7.5 11/05/2020   ALBUMIN 3.4 (L) 11/05/2020   BILITOT 0.2 (L) 11/05/2020   ALKPHOS 64 11/05/2020   AST 50 (H) 11/05/2020   ALT 54 (H) 11/05/2020   ANIONGAP 6 11/07/2020    CBG (last 3)  No results for input(s): GLUCAP in the last 72 hours.   GFR: CrCl cannot be calculated (Unknown ideal weight.).  Coagulation Profile: Recent Labs  Lab 11/07/20 0125  INR 1.1    Recent Results (from the past 240 hour(s))  Urine  culture     Status: Abnormal   Collection Time: 11/05/20 11:03 PM   Specimen: Urine, Random  Result Value Ref Range Status   Specimen Description URINE, RANDOM  Final   Special Requests NONE  Final   Culture (A)  Final    <10,000 COLONIES/mL INSIGNIFICANT GROWTH Performed at [facility], [address] [place], [place] [address]    Report Status 11/07/2020 FINAL  Final  SARS CORONAVIRUS 2 (TAT 6-24 HRS) Nasopharyngeal Nasopharyngeal Swab     Status: None   Collection Time: 11/06/20 12:14 AM   Specimen: Nasopharyngeal Swab  Result Value Ref Range Status   SARS Coronavirus 2 NEGATIVE NEGATIVE Final    Comment: (NOTE) SARS-CoV-2 target nucleic acids are NOT DETECTED.  The SARS-CoV-2 RNA is generally detectable in upper and lower respiratory specimens during the acute phase of infection. Negative results do not preclude SARS-CoV-2 infection, do not rule out co-infections with other pathogens, and should not be used as the sole basis for treatment or other patient management decisions. Negative results must be combined with clinical observations, patient history, and epidemiological information. The expected result is Negative.  Fact Sheet for Patients: SugarRoll.be  Fact Sheet for Healthcare Providers: https://www.woods-mathews.com/  This test is not yet approved or cleared by the [place] FDA and  has been authorized for detection and/or diagnosis of SARS-CoV-2 by FDA under an Emergency Use Authorization (EUA). This  EUA will remain  in effect (meaning this test can be used) for the duration of the COVID-19 declaration under Se ction 564(b)(1) of the Act, 21 U.S.C. section 360bbb-3(b)(1), unless the authorization is terminated or revoked sooner.  Performed at [facility], [place] [address], [place], [place] [address]         Radiology Studies: CT ABDOMEN PELVIS WO CONTRAST  Result Date: 11/05/2020 CLINICAL DATA:   Back and flank pain, renal failure, nausea and vomiting for 2 days, dysuria and EXAM: CT ABDOMEN AND PELVIS WITHOUT CONTRAST TECHNIQUE: Multidetector CT imaging of the abdomen and pelvis was performed following the standard protocol without IV contrast. COMPARISON:  CT 10/17/2020 MR 10/02/2020 FINDINGS: Motion degradation of the lung bases and upper abdomen likely related to respiratory motion artifact. Lower chest: Lung bases appear grossly clear. Borderline cardiomegaly. Hypoattenuation of the cardiac blood pool often reflective of anemia. Trace pericardial effusion. Hepatobiliary: No visible focal liver lesion. Smooth liver surface contour. Normal hepatic attenuation. Gallbladder largely decompressed at the time of exam. Gross gallbladder abnormality is seen. No visible calcified gallstones. No discernible biliary ductal dilatation though evaluation limited upper motion artifact. Pancreas: No discernible pancreatic mass or lesion. No peripancreatic stranding. No visible ductal dilatation. Spleen: Normal in size. No concerning splenic lesions. Adrenals/Urinary Tract: Adrenal glands are difficult to fully discern in the absence of contrast media given extensive motion artifact. Some layering hyperdensity is seen within the proximal collecting system bilaterally. Given symmetry of this finding, could reflect excreted contrast media versus is milk of calcium. A relatively brush like appearance argues against discrete urolithiasis though may be accentuated by motion artifact. Mild to moderate bilateral hydroureteronephrosis. Visible distal ureteral calculi. Urinary bladder is largely decompressed at the time of exam and therefore poorly evaluated by CT imaging. Stomach/Bowel: Challenging assessment the bowel in the absence of contrast media or enteric contrast. No gross acute abnormality is seen of the distal thoracic esophagus, stomach or duodenum. Multiple fluid-filled loops of small bowel are present without gross  distention. No discernible bowel wall thickening. Moderate to large colonic stool burden. Discrete colonic wall thickening dilatation or dilatation. No CT features of high-grade obstruction. Vascular/Lymphatic: Limited assessment of the vasculature in the absence of contrast media no aneurysm or ectasia is evident. Numerous pelvic phleboliths. Reproductive: Markedly enlarged, heterogeneous uterus compatible the numerous large fibroid seen on comparison CT and MR imaging, better characterized on those exams. No gross interval change from comparison prior. No discrete uterine masses are observable within the limitations of this unenhanced exam. Other: Small fluid attenuation along the right pelvic floor appears to have reaccumulated in the interim from comparison prior CT following removal of a drainage catheter on the CT images dated 10/17/2020. This collection measures 4.4 x 2.2 x 4.3 cm. Some mild surrounding stranding is nonspecific. Musculoskeletal: No acute osseous abnormality or suspicious osseous lesion. Stable chronic angulation of the sacrococcygeal junction. 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 Minimal degenerative changes the spine, hips pelvis. IMPRESSION: 1. Evaluation is complicated by an absence of intravenous contrast media, extensive respiratory motion artifact and a paucity of intraperitoneal fat, all of which may contribute to limited detection of subtle abnormalities. 2. Reaccumulation of the thick-walled collection along the right pelvic floor following removal of a drainage catheter seen on comparison CT imaging concerning for recurrent perirectal abscess. 3. Markedly enlarged, heterogeneous uterus with multiple dural mass is suspicious for uterine fibroids though underlying leiomyosarcoma is not fully excluded. Findings are better detailed on comparison MR  imaging. 4. Persistent and slightly increased mild to moderate hydroureteronephrosis at least to the level of the markedly  enlarged uterus, possibly secondary to mass effect. Can certainly result in some diminished renal function. Increased attenuation dependently within the collecting systems could reflect excreted contrast media versus milk of calcium or urolithiasis with motion artifact. 5. Hypoattenuation of the cardiac blood pool, often reflective of anemia. 6. Trace pericardial effusion. Electronically Signed   By: Lovena Le M.D.   On: 11/05/2020 22:19        Scheduled Meds: . docusate sodium  100 mg Oral BID  . dolutegravir  50 mg Oral Daily  . ferrous sulfate  325 mg Oral Q breakfast  . lamiVUDine  300 mg Oral Daily  . leucovorin  25 mg Oral Daily  . levETIRAcetam  750 mg Oral BID  . pantoprazole  40 mg Oral QHS  . pyrimethamine  75 mg Oral Q breakfast  . sulfaDIAZINE  1,500 mg Oral QID  . sulfaDIAZINE  1,500 mg Oral QID   Continuous Infusions:    LOS: 1 day     Cordelia Poche, MD [facility] 11/07/2020, 10:20 AM  If 7PM-7AM, please contact night-coverage [email or web address]

## 2020-11-07 NOTE — Progress Notes (Signed)
[facility] for Infectious Disease  Date of Admission:  11/05/2020      Total days of antibiotics 35           ASSESSMENT: Candace Jones is a 40 y.o. 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 Admitted to the hospital with acute kidney injury, recurrent perirectal abscess and abdominal pain.   AKI = SCr 5.8 >> 4.4 >> 3.4 today. Most concerning primary cause seems to be the obstructive uterus with bilateral hydroureteronephrosis >>also with mention of distal ureteral calculi?  ?acutely worsened due to recent heavy menstrual cycle vs calculi. No proteinuria on UA. Unlikely due primarily to recent tenofovir exposure, but currently on sparing ART regimen to account for low CrCl.  Please call GYN / Urology to see her for consideration of surgical management. While immunosuppressed, she did quite well with brain surgery 6 weeks ago and her viral load is likely under good control or nearly so at this point.   CNS Toxoplasmosis, AIDS+ = today is day 35/42 for induction therapy. Will continue current doses of pyrimethamine and leucovorin once daily. Will plan to change sulfadiazine QID to Clindamycin QID in hopes she will tolerate this better. Will plan this after abscess drain is placed and culture sent. MR Head WO contrast ordered to follow up CNS lesions in the setting of headaches, blurry vision and vomiting of some of her medications. Add Dapsone for PJP proph once we switch to clinda (G6PD normal).   HIV = VL pending after 2-3 weeks of treatment. We started tenofovir sparing regimen of lamivudine QD + Dolutegravir QD. Explained this to her today that at discharge will be on new single tablet Dovato once daily. We will take care to set her new pill tray prior to her discharge.   Headaches = seem better today after rehydrating vs stopping biktarvy. Either related to CNS disease or S/E from biktarvy (which is now on hold). MRI pending as  above.   Blurry Vision = seems better today. She could have occular toxoplasmosis involvement vs other OI (CMV). CMV pcr pending (though this may be hard to interpret as a surrogate for invasive disease). Would recommend ophthalmology to see the patient to help with recommendations if concern for occular disease.   Perirectal Abscess = percutaneous drain to be placed today. She is currently NPO. Please send for routine and AFB cultures.   Tender PIV = appears to be infiltrating. I stopped saline infusion and notified her bedside team to assess.     PLAN: 1. Continue pyrimethamine, leucovorin daily 2. Will change sulfadiazine to QID clinidamycin this evening after drain 3. Follow routine / AFB cultures for drain (orders placed)  4. Please ask GYN / Urology to consult in review of ureteral obstruction 5. Continue dolutegravir + lamivudine PO daily together for HIV treatment  6. Follow VL 7. Follow CMV PCR  8. 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 docusate sodium  100 mg Oral BID  . dolutegravir  50 mg Oral Daily  .  ferrous sulfate  325 mg Oral Q breakfast  . lamiVUDine  300 mg Oral Daily  . leucovorin  25 mg Oral Daily  . levETIRAcetam  750 mg Oral BID  . pantoprazole  40 mg Oral QHS  . pyrimethamine  75 mg Oral Q breakfast  . sulfaDIAZINE  1,500 mg Oral QID  . sulfaDIAZINE  1,500 mg Oral Once  . sulfaDIAZINE  1,500 mg Oral QID    SUBJECTIVE: Feeling about the same. L AC IV hurts and puffy Vomiting last night with PM medication pill burden.  Still with abdominal pain, "warmth" and nausea.  Headaches  are a bit better. Blurry vision still present but better   Review of Systems: Review of Systems  Constitutional: Negative for chills, fever and weight loss.  Eyes: Positive for blurred vision.  Respiratory: Negative for cough and wheezing.   Cardiovascular: Negative for chest pain and leg swelling.  Gastrointestinal: Positive for abdominal pain (epigastric), constipation, heartburn, nausea and vomiting. Negative for diarrhea.  Genitourinary: Negative for dysuria.  Musculoskeletal: Negative for joint pain and neck pain.  Neurological: Positive for headaches.    No Known Allergies  OBJECTIVE: Vitals:   11/06/20 1640 11/06/20 2008 11/07/20 0009 11/07/20 0536  BP: 128/90 124/81 124/86 115/80  Pulse: 86 77 76 86  Resp: 18 18 16 15   Temp: 98.5 F (36.9 C) 97.8 F (36.6 C) 97.9 F (36.6 C) 97.8 F (36.6 C)  TempSrc: Oral Oral Axillary Axillary  SpO2:  99% 100% 100%   There is no height or weight on file to calculate BMI.  Physical Exam Vitals reviewed.  Constitutional:      Appearance: Normal appearance.  HENT:     Mouth/Throat:     Mouth: Mucous membranes are moist.     Pharynx: No oropharyngeal exudate.  Eyes:     General: No scleral icterus.    Conjunctiva/sclera: Conjunctivae normal.  Cardiovascular:     Rate and Rhythm: Normal rate and regular rhythm.  Pulmonary:     Effort: Pulmonary effort is normal.     Breath sounds: Normal breath sounds.  Abdominal:     General: Bowel sounds are normal. There is distension.     Palpations: Abdomen is soft.     Tenderness: There is abdominal tenderness.  Musculoskeletal:        General: Normal range of motion.  Skin:    General: Skin is warm and dry.     Capillary Refill: Capillary refill takes less than 2 seconds.  Neurological:     Mental Status: She is alert and oriented to person, place, and time.     Lab Results Lab Results  Component Value Date   WBC 3.7 (L) 11/07/2020   HGB 7.3 (L) 11/07/2020   HCT 23.5 (L)  11/07/2020   MCV 75.1 (L) 11/07/2020   PLT 193 11/07/2020    Lab Results  Component Value Date   CREATININE 3.42 (H) 11/07/2020   BUN 26 (H) 11/07/2020   NA 136 11/07/2020   K 4.4 11/07/2020   CL 108 11/07/2020   CO2 22 11/07/2020    Lab Results  Component Value Date   ALT 54 (H) 11/05/2020   AST 50 (H) 11/05/2020   ALKPHOS 64 11/05/2020   BILITOT 0.2 (L) 11/05/2020     Microbiology: Recent Results (from the past 240 hour(s))  Urine culture     Status: Abnormal   Collection Time: 11/05/20 11:03 PM   Specimen: Urine, Random  Result Value Ref Range Status  Specimen Description URINE, RANDOM  Final   Special Requests NONE  Final   Culture (A)  Final    <10,000 COLONIES/mL INSIGNIFICANT GROWTH Performed at [facility], [place] [address], [place], [place] [address]    Report Status 11/07/2020 FINAL  Final  SARS CORONAVIRUS 2 (TAT 6-24 HRS) Nasopharyngeal Nasopharyngeal Swab     Status: None   Collection Time: 11/06/20 12:14 AM   Specimen: Nasopharyngeal Swab  Result Value Ref Range Status   SARS Coronavirus 2 NEGATIVE NEGATIVE Final    Comment: (NOTE) SARS-CoV-2 target nucleic acids are NOT DETECTED.  The SARS-CoV-2 RNA is generally detectable in upper and lower respiratory specimens during the acute phase of infection. Negative results do not preclude SARS-CoV-2 infection, do not rule out co-infections with other pathogens, and should not be used as the sole basis for treatment or other patient management decisions. Negative results must be combined with clinical observations, patient history, and epidemiological information. The expected result is Negative.  Fact Sheet for Patients: SugarRoll.be  Fact Sheet for Healthcare Providers: https://www.woods-mathews.com/  This test is not yet approved or cleared by the [place] FDA and  has been authorized for detection and/or diagnosis of SARS-CoV-2 by FDA under an  Emergency Use Authorization (EUA). This EUA will remain  in effect (meaning this test can be used) for the duration of the COVID-19 declaration under Se ction 564(b)(1) of the Act, 21 U.S.C. section 360bbb-3(b)(1), unless the authorization is terminated or revoked sooner.  Performed at [facility], [place] [address], [place], [place] [address]     Janene Madeira, MSN, NP-C [facility] [email or web address] Pager: [phone] Office: [phone] Justice: [phone]

## 2020-11-07 NOTE — TOC Initial Note (Addendum)
Transition of Care [facility]) - Initial/Assessment Note    Patient Details  Name: Candace Jones MRN: [id number] Date of Birth: 12-May-1981  Transition of Care [facility] CM/SW Contact:    Verdell Carmine, RN Phone Number: 11/07/2020, 9:08 AM  Clinical Narrative:                  40 YO patient admitted for reoccurring perirectal abscess, ARF in the  Light of AIDS. She had recent craniotomy for biopsy and is on medications for toxiplasmosis. Currently uninsured, no PCP. Needs to get partnered with [facility]. M will follow for needs.  Will reach out to F for Medicaid if not already started.  Expected Discharge Plan: [place] Barriers to Discharge: Continued Medical Work up   Patient Goals and CMS Choice        Expected Discharge Plan and Services Expected Discharge Plan: [place]   Discharge Planning Services: CM Consult   Living arrangements for the past 2 months: Apartment                                      Prior Living Arrangements/Services Living arrangements for the past 2 months: Apartment Lives with:: Self Patient language and need for interpreter reviewed:: Yes        Need for Family Participation in Patient Care: Yes (Comment) Care giver support system in place?: Yes (comment)   Criminal Activity/Legal Involvement Pertinent to Current Situation/Hospitalization: No - Comment as needed  Activities of Daily Living      Permission Sought/Granted                  Emotional Assessment       Orientation: : Oriented to Situation,Oriented to  Time,Oriented to Place,Oriented to Self Alcohol / Substance Use: Not Applicable    Admission diagnosis:  Hyperkalemia [E87.5] AKI (acute kidney injury) ([place]) [N17.9] Pelvic abscess in female [N73.9] Patient Active Problem List   Diagnosis Date Noted  . Pelvic abscess in female   . Heartburn   . Episodic paroxysmal hemicrania, not intractable   .  Blurry vision   . Obstructive uropathy   . AKI (acute kidney injury) ([place]) 11/05/2020  . Hyperkalemia 11/05/2020  . Nausea & vomiting 10/17/2020  . MRI contraindicated due to metal implant   . 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 AIDS (acquired immune deficiency syndrome) ([place])   . Uterine mass   . Perirectal abscess   . Seizure ([place]) 09/29/2020  . Microcytic hypochromic anemia 09/29/2020   PCP:  Patient, No Pcp Per (Inactive) Pharmacy:   [id number] [place], [place] [address] [place] [address] Phone: [phone] Fax: [phone]  [facility] [address] [place] [address] Phone: [phone] Fax: [phone]  [facility] [place], [place] [address] Phone: [phone] Fax: [phone]  [facility] [phone] [place], [place] [address] Phone: [phone] Fax: [phone]     Social Determinants of Health (SDOH) Interventions    Readmission Risk Interventions No flowsheet data found.

## 2020-11-07 NOTE — Procedures (Signed)
Interventional Radiology Procedure Note  Procedure: Right pelvic drain placement  Findings: Please refer to procedural dictation for full description. CT guided right posterior perirectal abscess drain placement, 10.2 Fr.  Approximately 20 mL purulent aspirate.  Complications: None immeidate  Estimated Blood Loss: < 5 mL  Recommendations: Keep to bulb suction. IR will follow.   Ruthann Cancer, MD

## 2020-11-07 NOTE — Progress Notes (Signed)
Subjective: No new complaints today except pain at her IV site  ROS: See above, otherwise other systems negative  Objective: Vital signs in last 24 hours: Temp:  [97.8 F (36.6 C)-98.5 F (36.9 C)] 97.8 F (36.6 C) (05/27 0536) Pulse Rate:  [76-86] 86 (05/27 0536) Resp:  [15-20] 15 (05/27 0536) BP: (115-138)/(80-90) 115/80 (05/27 0536) SpO2:  [99 %-100 %] 100 % (05/27 0536) Last BM Date: 11/06/20  Intake/Output from previous day: 05/26 0701 - 05/27 0700 In: 2459.8 [I.V.:2459.8] Out: -  Intake/Output this shift: No intake/output data recorded.  PE: Heart: regular Lungs: CTAB Abd: soft, NT Rectum: no pain or areas of induration or infection noted Ext: LUE with some roping and induration over vein in [facility] near current IV, although current IV site looks ok  Lab Results:  Recent Labs    11/06/20 0208 11/07/20 0125  WBC 3.8* 3.7*  HGB 7.1* 7.3*  HCT 23.8* 23.5*  PLT 186 193   BMET Recent Labs    11/06/20 0208 11/07/20 0125  NA 138 136  K 4.2 4.4  CL 109 108  CO2 22 22  GLUCOSE 110* 93  BUN 28* 26*  CREATININE 4.40* 3.42*  CALCIUM 8.8* 8.5*   PT/INR Recent Labs    11/07/20 0125  LABPROT 13.8  INR 1.1   CMP     Component Value Date/Time   NA 136 11/07/2020 0125   K 4.4 11/07/2020 0125   CL 108 11/07/2020 0125   CO2 22 11/07/2020 0125   GLUCOSE 93 11/07/2020 0125   BUN 26 (H) 11/07/2020 0125   CREATININE 3.42 (H) 11/07/2020 0125   CREATININE 5.87 (H) 10/31/2020 1126   CALCIUM 8.5 (L) 11/07/2020 0125   PROT 7.5 11/05/2020 1725   ALBUMIN 3.4 (L) 11/05/2020 1725   AST 50 (H) 11/05/2020 1725   ALT 54 (H) 11/05/2020 1725   ALKPHOS 64 11/05/2020 1725   BILITOT 0.2 (L) 11/05/2020 1725   GFRNONAA 17 (L) 11/07/2020 0125   Lipase     Component Value Date/Time   LIPASE 34 11/05/2020 1725       Studies/Results: CT ABDOMEN PELVIS WO CONTRAST  Result Date: 11/05/2020 CLINICAL DATA:  Back and flank pain, renal failure, nausea and vomiting  for 2 days, dysuria and EXAM: CT ABDOMEN AND PELVIS WITHOUT CONTRAST TECHNIQUE: Multidetector CT imaging of the abdomen and pelvis was performed following the standard protocol without IV contrast. COMPARISON:  CT 10/17/2020 MR 10/02/2020 FINDINGS: Motion degradation of the lung bases and upper abdomen likely related to respiratory motion artifact. Lower chest: Lung bases appear grossly clear. Borderline cardiomegaly. Hypoattenuation of the cardiac blood pool often reflective of anemia. Trace pericardial effusion. Hepatobiliary: No visible focal liver lesion. Smooth liver surface contour. Normal hepatic attenuation. Gallbladder largely decompressed at the time of exam. Gross gallbladder abnormality is seen. No visible calcified gallstones. No discernible biliary ductal dilatation though evaluation limited upper motion artifact. Pancreas: No discernible pancreatic mass or lesion. No peripancreatic stranding. No visible ductal dilatation. Spleen: Normal in size. No concerning splenic lesions. Adrenals/Urinary Tract: Adrenal glands are difficult to fully discern in the absence of contrast media given extensive motion artifact. Some layering hyperdensity is seen within the proximal collecting system bilaterally. Given symmetry of this finding, could reflect excreted contrast media versus is milk of calcium. A relatively brush like appearance argues against discrete urolithiasis though may be accentuated by motion artifact. Mild to moderate bilateral hydroureteronephrosis. Visible distal ureteral calculi. Urinary bladder is largely  decompressed at the time of exam and therefore poorly evaluated by CT imaging. Stomach/Bowel: Challenging assessment the bowel in the absence of contrast media or enteric contrast. No gross acute abnormality is seen of the distal thoracic esophagus, stomach or duodenum. Multiple fluid-filled loops of small bowel are present without gross distention. No discernible bowel wall thickening.  Moderate to large colonic stool burden. Discrete colonic wall thickening dilatation or dilatation. No CT features of high-grade obstruction. Vascular/Lymphatic: Limited assessment of the vasculature in the absence of contrast media no aneurysm or ectasia is evident. Numerous pelvic phleboliths. Reproductive: Markedly enlarged, heterogeneous uterus compatible the numerous large fibroid seen on comparison CT and MR imaging, better characterized on those exams. No gross interval change from comparison prior. No discrete uterine masses are observable within the limitations of this unenhanced exam. Other: Small fluid attenuation along the right pelvic floor appears to have reaccumulated in the interim from comparison prior CT following removal of a drainage catheter on the CT images dated 10/17/2020. This collection measures 4.4 x 2.2 x 4.3 cm. Some mild surrounding stranding is nonspecific. Musculoskeletal: No acute osseous abnormality or suspicious osseous lesion. Stable chronic angulation of the sacrococcygeal junction. 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 Minimal degenerative changes the spine, hips pelvis. IMPRESSION: 1. Evaluation is complicated by an absence of intravenous contrast media, extensive respiratory motion artifact and a paucity of intraperitoneal fat, all of which may contribute to limited detection of subtle abnormalities. 2. Reaccumulation of the thick-walled collection along the right pelvic floor following removal of a drainage catheter seen on comparison CT imaging concerning for recurrent perirectal abscess. 3. Markedly enlarged, heterogeneous uterus with multiple dural mass is suspicious for uterine fibroids though underlying leiomyosarcoma is not fully excluded. Findings are better detailed on comparison MR imaging. 4. Persistent and slightly increased mild to moderate hydroureteronephrosis at least to the level of the markedly enlarged uterus, possibly secondary to mass effect. Can  certainly result in some diminished renal function. Increased attenuation dependently within the collecting systems could reflect excreted contrast media versus milk of calcium or urolithiasis with motion artifact. 5. Hypoattenuation of the cardiac blood pool, often reflective of anemia. 6. Trace pericardial effusion. Electronically Signed   By: Lovena Le M.D.   On: 11/05/2020 22:19    Anti-infectives: Anti-infectives (From admission, onward)   Start     Dose/Rate Route Frequency Ordered Stop   11/07/20 0800  pyrimethamine (DARAPRIM) tablet 75 mg  Status:  Discontinued        75 mg Oral Daily with breakfast 11/06/20 0043 11/06/20 2011   11/06/20 2200  sulfaDIAZINE tablet 1,500 mg        1,500 mg Oral 4 times daily 11/06/20 1448 11/08/20 0959   11/06/20 2100  pyrimethamine (DARAPRIM) tablet 75 mg        75 mg Oral Daily with breakfast 11/06/20 2011     11/06/20 2000  dolutegravir (TIVICAY) tablet 50 mg        50 mg Oral Daily 11/06/20 1754     11/06/20 2000  lamiVUDine (EPIVIR) tablet 300 mg       Note to Pharmacy: Do NOT adjust dose based on renal fxn   300 mg Oral Daily 11/06/20 1754     11/06/20 1800  sulfaDIAZINE tablet 1,500 mg  Status:  Discontinued        1,500 mg Oral  Once 11/06/20 1321 11/07/20 0947   11/06/20 1030  sulfaDIAZINE tablet 1,500 mg  1,500 mg Oral  Once 11/06/20 1027 11/06/20 1156   11/06/20 1000  sulfaDIAZINE tablet 1,500 mg        1,500 mg Oral 4 times daily 11/06/20 0043         Assessment/Plan MMP IV site pain - d/w RN  Pelvic floor fluid collection -IR to see about replacement of drain today. -await cxs, last were negative -would have ID guide on further recommendations regarding this collection.  Unclear etiology and truly if this is infectious.  Await drain placement to see what is extracted.  FEN - NPO for procedure, may eat after VTE - on hold for procedure ID - per ID   LOS: 1 day    Henreitta Cea , [facility]  Surgery 11/07/2020, 9:48 AM Please see Amion for pager number during day hours 7:00am-4:30pm or 7:00am -11:30am on weekends

## 2020-11-08 ENCOUNTER — Inpatient Hospital Stay (HOSPITAL_COMMUNITY): Payer: Medicaid Other

## 2020-11-08 ENCOUNTER — Other Ambulatory Visit: Payer: Self-pay | Admitting: Radiology

## 2020-11-08 DIAGNOSIS — N179 Acute kidney failure, unspecified: Principal | ICD-10-CM

## 2020-11-08 DIAGNOSIS — K611 Rectal abscess: Secondary | ICD-10-CM

## 2020-11-08 LAB — CBC
HCT: 23.7 % — ABNORMAL LOW (ref 36.0–46.0)
Hemoglobin: 7.3 g/dL — ABNORMAL LOW (ref 12.0–15.0)
MCH: 23.2 pg — ABNORMAL LOW (ref 26.0–34.0)
MCHC: 30.8 g/dL (ref 30.0–36.0)
MCV: 75.2 fL — ABNORMAL LOW (ref 80.0–100.0)
Platelets: 198 10*3/uL (ref 150–400)
RBC: 3.15 MIL/uL — ABNORMAL LOW (ref 3.87–5.11)
WBC: 3.3 10*3/uL — ABNORMAL LOW (ref 4.0–10.5)
nRBC: 0 % (ref 0.0–0.2)

## 2020-11-08 LAB — BASIC METABOLIC PANEL
Anion gap: 10 (ref 5–15)
BUN: 18 mg/dL (ref 6–20)
CO2: 20 mmol/L — ABNORMAL LOW (ref 22–32)
Calcium: 8.5 mg/dL — ABNORMAL LOW (ref 8.9–10.3)
Chloride: 106 mmol/L (ref 98–111)
Creatinine, Ser: 2.54 mg/dL — ABNORMAL HIGH (ref 0.44–1.00)
GFR, Estimated: 24 mL/min — ABNORMAL LOW (ref >60)
Glucose, Bld: 84 mg/dL (ref 70–99)
Potassium: 4.4 mmol/L (ref 3.5–5.1)
Sodium: 136 mmol/L (ref 135–145)

## 2020-11-08 LAB — SAVE SMEAR(SSMR), FOR PROVIDER SLIDE REVIEW

## 2020-11-08 LAB — LACTATE DEHYDROGENASE: LDH: 204 U/L — ABNORMAL HIGH (ref 98–192)

## 2020-11-08 LAB — DIRECT ANTIGLOBULIN TEST (NOT AT ARMC)
DAT, IgG: NEGATIVE
DAT, complement: NEGATIVE

## 2020-11-08 LAB — RETICULOCYTES
RBC.: 3.15 MIL/uL — ABNORMAL LOW (ref 3.87–5.11)
Retic Ct Pct: <0.4 % — ABNORMAL LOW (ref 0.4–3.1)

## 2020-11-08 LAB — PREPARE RBC (CROSSMATCH)

## 2020-11-08 LAB — ACID FAST SMEAR (AFB, MYCOBACTERIA): Acid Fast Smear: NEGATIVE

## 2020-11-08 MED ORDER — SODIUM CHLORIDE 0.9% IV SOLUTION: Freq: Once | INTRAVENOUS | Status: AC

## 2020-11-08 MED ORDER — PHENOL 1.4 % MT LIQD
1.0000 | OROMUCOSAL | Status: DC | PRN
  Filled 2020-11-08: qty 177

## 2020-11-08 MED ORDER — CALCIUM CARBONATE ANTACID 500 MG PO CHEW
1.0000 | CHEWABLE_TABLET | Freq: Three times a day (TID) | ORAL | Status: DC | PRN
  Administered 2020-11-08: 200 mg via ORAL
  Filled 2020-11-08 (×2): qty 1

## 2020-11-08 MED ORDER — SODIUM CHLORIDE 0.9 % IV SOLN: INTRAVENOUS | Status: AC

## 2020-11-08 NOTE — Progress Notes (Signed)
[place] for Infectious Disease    Date of Admission:  11/05/2020   Total days of antibiotics 3          ID: Candace Jones is a 40 y.o. 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 She states her nausea is improved with anti-emetics. Not having abdominal pain. Tolerating drain placement.underwent MRI that showed improvement in CNS lesions   Medications:  . clindamycin  600 mg Oral Q6H  . docusate sodium  100 mg Oral BID  . dolutegravir  50 mg Oral Daily  . ferrous sulfate  325 mg Oral Q breakfast  . lamiVUDine  300 mg Oral Daily  . leucovorin  25 mg Oral Daily  . levETIRAcetam  750 mg Oral BID  . pantoprazole  40 mg Oral QHS  . pyrimethamine  75 mg Oral Q breakfast  . sodium chloride flush  5 mL Intracatheter Q8H    Objective: Vital signs in last 24 hours: Temp:  [97.6 F (36.4 C)-98.3 F (36.8 C)] 97.6 F (36.4 C) (05/28 1349) Pulse Rate:  [78-80] 78 (05/28 1349) Resp:  [18] 18 (05/28 1349) BP: (116-121)/(84-95) 121/95 (05/28 1349) SpO2:  [95 %-100 %] 100 % (05/28 1349)  Physical Exam  Constitutional:  oriented to person, place, and time. appears chronically ill and well-nourished. No distress.  HENT: [place]/AT, PERRLA, no scleral icterus;bitemporal wasting Mouth/Throat: Oropharynx is clear and moist. No oropharyngeal exudate.  Cardiovascular: Normal rate, regular rhythm and normal heart sounds. Exam reveals no gallop and no friction rub.  No murmur heard.   Pulmonary/Chest: Effort normal and breath sounds normal. No respiratory distress.  has no wheezes.  Neck = supple, no nuchal rigidity Abdominal: Soft. Bowel sounds are normal.  exhibits no distension. There is no tenderness. GU= Drain serous fluid Lymphadenopathy: no cervical adenopathy. No axillary adenopathy Neurological: alert and oriented to person, place, and time.  Skin: Skin is warm and dry. No rash noted. No erythema.  Psychiatric: a normal mood and affect.  behavior is normal.    Lab Results Recent Labs    11/07/20 0125 11/08/20 0305  WBC 3.7* 3.3*  HGB 7.3* 7.3*  HCT 23.5* 23.7*  NA 136 136  K 4.4 4.4  CL 108 106  CO2 22 20*  BUN 26* 18  CREATININE 3.42* 2.54*   Liver Panel Recent Labs    11/05/20 1725  PROT 7.5  ALBUMIN 3.4*  AST 50*  ALT 54*  ALKPHOS 64  BILITOT 0.2*    Microbiology: reviewed Studies/Results: MR BRAIN WO CONTRAST  Result Date: 11/08/2020 CLINICAL DATA:  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 EXAM: MRI HEAD WITHOUT CONTRAST TECHNIQUE: Multiplanar, multiecho pulse sequences of the brain and surrounding structures were obtained without intravenous contrast. COMPARISON:  Prior MRI from 10/01/2019 FINDINGS: Brain: Previously seen 2 ring-enhancing lesions involving the left frontal operculum and left temporal lobe are markedly improved as compared to previous exam, consistent with interval response to therapy. A small area of  mild residual T2/FLAIR signal abnormality measuring up to 2.6 cm now seen at the level of the left frontal region (series 11, image 15). Small amount of associated T1 hyperintensity and susceptibility artifact, which could reflect blood products. Additional fairly mild residual FLAIR signal abnormality seen at the left temporal lesion as well (series 11, image 8). This lesion was the site of biopsy, with postsurgical changes seen within the overlying scalp and  calvarium. 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 Previously seen vasogenic edema and mass effect is markedly improved, with no significant regional mass effect now seen. No midline shift. No other new lesions or other signal abnormality identified. No evidence for acute or subacute infarct. No other evidence for acute or chronic intracranial hemorrhage. 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 No other mass lesion or mass effect. No extra-axial fluid collection. Pituitary gland suprasellar region normal. Midline structures intact. Vascular: Major intracranial vascular flow voids are maintained. Skull and upper cervical spine: Craniocervical junction normal. Diffusely decreased T1 signal intensity throughout the visualized bone marrow, likely related to anemia. Postoperative changes present at the left temporal region. Sinuses/Orbits: Globes and orbital soft tissues demonstrate no acute finding. Chronic frontoethmoidal and maxillary sinusitis noted, similar to previous. No significant mastoid effusion. Inner ear structures grossly normal. Other: None. IMPRESSION: 1. Marked interval improvement in appearance of the previously identified two ring-enhancing lesions involving the left cerebral hemisphere, consistent with interval response to therapy. Residual mild localized signal change and edema at these locations without significant regional mass effect. 2. Sequelae of prior brain biopsy at the left temporal lesion, with associated small dural defect and meningoencephalocele. 3. No other new intracranial lesions or other abnormality identified. 4. Chronic frontoethmoidal and maxillary sinusitis, similar to previous. Electronically Signed   By: Jeannine Boga M.D.   On: 11/08/2020 04:14   CT IMAGE GUIDED DRAINAGE BY PERCUTANEOUS CATHETER  Result Date:  11/07/2020 INDICATION: 40 year old female with history of right perirectal abscess status post percutaneous drainage catheter placement 10/07/2020 which was subsequently removed earlier this month. Presents with recurrent pain secondary to reaccumulation of abscess. EXAM: CT IMAGE GUIDED DRAINAGE BY PERCUTANEOUS CATHETER COMPARISON:  None. MEDICATIONS: The patient is currently admitted to the hospital and receiving intravenous antibiotics. The antibiotics were administered within an appropriate time frame prior to the initiation of the procedure. ANESTHESIA/SEDATION: Moderate (conscious) sedation was employed during this procedure. A total of Versed 3 mg and Fentanyl 100 mcg was administered intravenously. Moderate Sedation Time: 10 minutes. The patient's level of consciousness and vital signs were monitored continuously by radiology nursing throughout the procedure under my direct supervision. CONTRAST:  None COMPLICATIONS: None immediate. PROCEDURE: Informed written consent was obtained from the patient after a discussion of the risks, benefits and alternatives to treatment. The patient was placed prone on the CT gantry and a pre procedural CT was performed re-demonstrating the known abscess/fluid collection within the right sub gluteal, perirectal space. The procedure was planned. A timeout was performed prior to the initiation of the procedure. The medial aspect of the right gluteal region was prepped and draped in the usual sterile fashion. The overlying soft tissues were anesthetized with 1% lidocaine with epinephrine. Appropriate trajectory was planned with the use of a 22 gauge spinal needle. An 18 gauge trocar needle was advanced into the abscess/fluid collection and a short Amplatz super stiff wire was coiled  within the collection. Appropriate positioning was confirmed with a limited CT scan. The tract was serially dilated allowing placement of a 10.2 [place] all-purpose drainage catheter. Appropriate  positioning was confirmed with a limited postprocedural CT scan. Approximately 20 ml of purulent fluid was aspirated. The tube was connected to a bulb suction and sutured in place. A dressing was placed. The patient tolerated the procedure well without immediate post procedural complication. IMPRESSION: Successful CT guided placement of a 10.2 [place] all purpose drain catheter into the right perirectal abscess with aspiration of approximately 20 mL of purulent fluid. Samples were sent to the laboratory as requested by the ordering clinical team. Ruthann Cancer, MD [facility] Radiology Electronically Signed   By: Ruthann Cancer MD   On: 11/07/2020 13:47     Assessment/Plan: HIV disease= using a TAF sparing regimen - currently on DTG and lamivudine  CNS toxo = continue on clindamycin, pyrimethamine, and leucovorin for induction treatment for CNS toxo; keppra for seizure proph  oi proph = will do weekly azithromycin 1200mg  po on Monday(give her a few days to get used to clindamycin )   Nausea SE = continue with pre treatment  AKI = appears improving since admit. Will continue to do nephro sparing regimen but may need to repeat imaging to see if obstructive uropathy from enlarge uterus is worsening..  Perirectal abscess = continue with drain. Will follow cx results  [facility] for Infectious Diseases Cell: [phone] Pager: [phone]  11/08/2020, 3:49 PM

## 2020-11-08 NOTE — Progress Notes (Signed)
PROGRESS NOTE    Candace Jones  QIW:[id number] DOB: Apr 30, 1981 DOA: 11/05/2020 PCP: Patient, No Pcp Per (Inactive)   Brief Narrative: Candace Jones is a 40 y.o. female history of AIDS, toxoplasma meningoencephalitis, recent perirectal abscess. 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 She has been taking ibuprofen to help with headaches. CT imaging with evidence of ureteral mass every and resultant mild-moderate bilateral hydronephrosis; discussed with urology who states this chronic issues likely is not accounting for AKI. Complicated by patient's AIDS status. Improvement with IV fluids. -Nephrology recommendation: Signed off. -Daily BMP  Gross hematuria Seems this may have been vaginal bleeding rather than hematuria. Hematuria has resolved with resolution of vaginal bleeding. Discussed with urology who recommended outpatient urology resident clinic visit.  Acute blood loss anemia Acute on chronic anemia Patient with chronic anemia.  Previous iron panel from April 2022 significant for iron deficiency anemia.  Patient also with recent gross hematuria which likely contributed to acute component.  Secondary to uterine fibroids and resultant heavy menstrual bleeding. Baseline hemoglobin of about 8-9. Hemoglobin down to 7.3 and stable.  Reticulocyte count is low. -Serial CBC, transfuse as needed for hemoglobin less than 7 -Consult oncology  Perirectal  abscess Previously treated with percutaneous drain placement and antibiotics in April 2022.  Complicated by AIDS diagnosis. -General surgery recommendations: recommendation for drain injection prior to removal -IR recommendations: follow-up at drain clinic 10-14 days s/p discharge for CT/drain injection  AIDS Undetectable CD4 count.  Viral load of 57,200 copies per milliliter.  Patient is on [place] as an outpatient which has been held secondary to severe renal impairment. -Infectious disease recommendations: dolutegravir and lamivudine  Uterine fibroids Previously diagnosed as fibroids vs leiomyosarcoma. Gynecology consulted at previous hospitalization who recommended outpatient follow-up and per assessment, unlikely leiomyosarcoma.  Hyperkalemia Mildly elevated secondary to AKI. Given one dose of Lokelma.  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 Started on sulfadiazine and pyrimethamine which were continued on admission -Infectious disease recommendations: added clindamycin and discontinued sulfadiazine   DVT prophylaxis: SCDs Code Status:   Code Status: Full Code Family Communication: Sister at bedside Disposition Plan: Discharge home likely in several days pending continued management/improvement of AKI, management of abscess, ID recommendations   Consultants:   Infectious disease  Urology  General surgery  Medical oncology  Interventional radiology  Procedures:   RIGHT PELVIC DRAIN PLACEMENT (11/07/2020)  Antimicrobials:  Sulfadiazine  Lamivudine  Dolutegravir  Clindamycin   Subjective: Phone interpreter  No issues today. Asked about her MRI. No vaginal bleeding.  Objective: Vitals:   11/07/20 1153 11/07/20 1452 11/07/20 2111 11/08/20 0403  BP: (!) 132/98 126/89 116/88 121/86  Pulse: 76 86    Resp: 15 18 18 18   Temp:  97.7 F (36.5 C) 97.7 F (36.5 C) 98.3 F (36.8 C)  TempSrc:  Oral Oral Oral  SpO2: 99%  98% 95%    Intake/Output  Summary (Last 24 hours) at 11/08/2020 1304 Last data filed at 11/08/2020 0500 Gross per  24 hour  Intake 1062.24 ml  Output 1015 ml  Net 47.24 ml   There were no vitals filed for this visit.  Examination:  General exam: Appears calm and comfortable  Respiratory system: Clear to auscultation. Respiratory effort normal. Cardiovascular system: S1 & S2 heard, 2/6 systolic murmur Gastrointestinal system: Abdomen is nondistended, soft and nontender. No organomegaly or masses felt. Normal bowel sounds heard. Central nervous system: Alert and oriented. No focal neurological deficits. Musculoskeletal: No edema. No calf tenderness Skin: No cyanosis. No rashes Psychiatry: Judgement and insight appear normal. Mood & affect appropriate.    Data Reviewed: I have personally reviewed following labs and imaging studies  CBC Lab Results  Component Value Date   WBC 3.3 (L) 11/08/2020   RBC 3.15 (L) 11/08/2020   HGB 7.3 (L) 11/08/2020   HCT 23.7 (L) 11/08/2020   MCV 75.2 (L) 11/08/2020   MCH 23.2 (L) 11/08/2020   PLT 198 11/08/2020   MCHC 30.8 11/08/2020   RDW Not Measured 11/08/2020   LYMPHSABS 0.8 11/05/2020   MONOABS 0.4 11/05/2020   EOSABS 0.1 11/05/2020   BASOSABS 0.0 50/56/9794     Last metabolic panel Lab Results  Component Value Date   NA 136 11/08/2020   K 4.4 11/08/2020   CL 106 11/08/2020   CO2 20 (L) 11/08/2020   BUN 18 11/08/2020   CREATININE 2.54 (H) 11/08/2020   GLUCOSE 84 11/08/2020   GFRNONAA 24 (L) 11/08/2020   CALCIUM 8.5 (L) 11/08/2020   PHOS 3.6 10/01/2020   PROT 7.5 11/05/2020   ALBUMIN 3.4 (L) 11/05/2020   BILITOT 0.2 (L) 11/05/2020   ALKPHOS 64 11/05/2020   AST 50 (H) 11/05/2020   ALT 54 (H) 11/05/2020   ANIONGAP 10 11/08/2020    CBG (last 3)  No results for input(s): GLUCAP in the last 72 hours.   GFR: CrCl cannot be calculated (Unknown ideal weight.).  Coagulation Profile: Recent Labs  Lab 11/07/20 0125  INR 1.1    Recent Results (from  the past 240 hour(s))  Urine culture     Status: Abnormal   Collection Time: 11/05/20 11:03 PM   Specimen: Urine, Random  Result Value Ref Range Status   Specimen Description URINE, RANDOM  Final   Special Requests NONE  Final   Culture (A)  Final    <10,000 COLONIES/mL INSIGNIFICANT GROWTH Performed at [facility], [address], [place], [place] [address]    Report Status 11/07/2020 FINAL  Final  SARS CORONAVIRUS 2 (TAT 6-24 HRS) Nasopharyngeal Nasopharyngeal Swab     Status: None   Collection Time: 11/06/20 12:14 AM   Specimen: Nasopharyngeal Swab  Result Value Ref Range Status   SARS Coronavirus 2 NEGATIVE NEGATIVE Final    Comment: (NOTE) SARS-CoV-2 target nucleic acids are NOT DETECTED.  The SARS-CoV-2 RNA is generally detectable in upper and lower respiratory specimens during the acute phase of infection. Negative results do not preclude SARS-CoV-2 infection, do not rule out co-infections with other pathogens, and should not be used as the sole basis for treatment or other patient management decisions. Negative results must be combined with clinical observations, patient history, and epidemiological information. The expected result is Negative.  Fact Sheet for Patients: SugarRoll.be  Fact Sheet for Healthcare Providers: https://www.woods-mathews.com/  This test is not yet approved or cleared by the [place] FDA and  has been authorized for detection and/or diagnosis of SARS-CoV-2 by FDA under an Emergency Use Authorization (EUA). This EUA will remain  in  effect (meaning this test can be used) for the duration of the COVID-19 declaration under Se ction 564(b)(1) of the Act, 21 U.S.C. section 360bbb-3(b)(1), unless the authorization is terminated or revoked sooner.  Performed at [facility], [place] [address], [place], [place] [address]   Aerobic/Anaerobic Culture w Gram Stain (surgical/deep wound)     Status:  None (Preliminary result)   Collection Time: 11/07/20 10:10 AM   Specimen: Abscess  Result Value Ref Range Status   Specimen Description ABSCESS  Final   Special Requests PELVIC RIGHT  Final   Gram Stain PENDING  Incomplete   Culture   Final    NO GROWTH < 24 HOURS Performed at [facility], [place] [address] [place], [place] [address]    Report Status PENDING  Incomplete        Radiology Studies: MR BRAIN WO CONTRAST  Result Date: 11/08/2020 CLINICAL DATA:  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 EXAM: MRI HEAD WITHOUT CONTRAST TECHNIQUE: Multiplanar, multiecho pulse sequences of the brain and surrounding structures were obtained without intravenous contrast. COMPARISON:  Prior MRI from 10/01/2019 FINDINGS: Brain: Previously seen 2 ring-enhancing lesions involving the left frontal operculum and left temporal lobe are markedly improved as compared to previous exam, consistent with interval response to therapy. A small area of mild residual T2/FLAIR signal abnormality measuring up to 2.6 cm now seen at the level of the left frontal region (series 11, image 15). Small amount of associated T1 hyperintensity and susceptibility artifact, which could reflect blood products. Additional fairly mild residual FLAIR signal abnormality seen at the left temporal lesion as well (series 11, image 8). This lesion was the site of biopsy, with postsurgical changes seen within the overlying scalp and calvarium. 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 Previously seen vasogenic edema and mass effect is markedly improved, with no significant regional mass effect now seen. No midline shift. No other new lesions or other signal abnormality identified. No evidence for acute or subacute infarct. No other evidence for acute or chronic intracranial hemorrhage. Presumed  benign arachnoid cyst overlying the right frontal vertex with mild localized mass effect again noted, stable. No other mass lesion or mass effect. No extra-axial fluid collection. Pituitary gland suprasellar region normal. Midline structures intact. Vascular: Major intracranial vascular flow voids are maintained. Skull and upper cervical spine: Craniocervical junction normal. Diffusely decreased T1 signal intensity throughout the visualized bone marrow, likely related to anemia. Postoperative changes present at the left temporal region. Sinuses/Orbits: Globes and orbital soft tissues demonstrate no acute finding. Chronic frontoethmoidal and maxillary sinusitis noted, similar to previous. No significant mastoid effusion. Inner ear structures grossly normal. Other: None. IMPRESSION: 1. Marked interval improvement in appearance of the previously identified two ring-enhancing lesions involving the left cerebral hemisphere, consistent with interval response to therapy. Residual mild localized signal change and edema at these locations without significant regional mass effect. 2. Sequelae of prior brain biopsy at the left temporal lesion, with associated small dural defect and meningoencephalocele. 3. No other new intracranial lesions or other abnormality identified. 4. Chronic frontoethmoidal and maxillary sinusitis, similar to previous. Electronically Signed   By: Jeannine Boga M.D.   On: 11/08/2020 04:14   CT IMAGE GUIDED DRAINAGE BY PERCUTANEOUS CATHETER  Result Date: 11/07/2020 INDICATION: 40 year old female with history of right perirectal abscess status post percutaneous drainage catheter placement 10/07/2020 which  was subsequently removed earlier this month. Presents with recurrent pain secondary to reaccumulation of abscess. EXAM: CT IMAGE GUIDED DRAINAGE BY PERCUTANEOUS CATHETER COMPARISON:  None. MEDICATIONS: The patient is currently admitted to the hospital and receiving intravenous antibiotics. The  antibiotics were administered within an appropriate time frame prior to the initiation of the procedure. ANESTHESIA/SEDATION: Moderate (conscious) sedation was employed during this procedure. A total of Versed 3 mg and Fentanyl 100 mcg was administered intravenously. Moderate Sedation Time: 10 minutes. The patient's level of consciousness and vital signs were monitored continuously by radiology nursing throughout the procedure under my direct supervision. CONTRAST:  None COMPLICATIONS: None immediate. PROCEDURE: Informed written consent was obtained from the patient after a discussion of the risks, benefits and alternatives to treatment. The patient was placed prone on the CT gantry and a pre procedural CT was performed re-demonstrating the known abscess/fluid collection within the right sub gluteal, perirectal space. The procedure was planned. A timeout was performed prior to the initiation of the procedure. The medial aspect of the right gluteal region was prepped and draped in the usual sterile fashion. The overlying soft tissues were anesthetized with 1% lidocaine with epinephrine. Appropriate trajectory was planned with the use of a 22 gauge spinal needle. An 18 gauge trocar needle was advanced into the abscess/fluid collection and a short Amplatz super stiff wire was coiled within the collection. Appropriate positioning was confirmed with a limited CT scan. The tract was serially dilated allowing placement of a 10.2 [place] all-purpose drainage catheter. Appropriate positioning was confirmed with a limited postprocedural CT scan. Approximately 20 ml of purulent fluid was aspirated. The tube was connected to a bulb suction and sutured in place. A dressing was placed. The patient tolerated the procedure well without immediate post procedural complication. IMPRESSION: Successful CT guided placement of a 10.2 [place] all purpose drain catheter into the right perirectal abscess with aspiration of approximately 20 mL  of purulent fluid. Samples were sent to the laboratory as requested by the ordering clinical team. Ruthann Cancer, MD [facility] Electronically Signed   By: Ruthann Cancer MD   On: 11/07/2020 13:47        Scheduled Meds: . sodium chloride   Intravenous Once  . clindamycin  600 mg Oral Q6H  . docusate sodium  100 mg Oral BID  . dolutegravir  50 mg Oral Daily  . ferrous sulfate  325 mg Oral Q breakfast  . lamiVUDine  300 mg Oral Daily  . leucovorin  25 mg Oral Daily  . levETIRAcetam  750 mg Oral BID  . pantoprazole  40 mg Oral QHS  . pyrimethamine  75 mg Oral Q breakfast  . sodium chloride flush  5 mL Intracatheter Q8H   Continuous Infusions: . sodium chloride       LOS: 2 days     Cordelia Poche, MD [facility] 11/08/2020, 1:04 PM  If 7PM-7AM, please contact night-coverage [email or web address]

## 2020-11-08 NOTE — Progress Notes (Signed)
Subjective: No new complaints today.  Drain placed yesterday  ROS: See above, otherwise other systems negative  Objective: Vital signs in last 24 hours: Temp:  [97.7 F (36.5 C)-98.3 F (36.8 C)] 98.3 F (36.8 C) (05/28 0403) Pulse Rate:  [76-86] 86 (05/27 1452) Resp:  [13-19] 18 (05/28 0403) BP: (116-162)/(86-107) 121/86 (05/28 0403) SpO2:  [95 %-100 %] 95 % (05/28 0403) Last BM Date: 11/07/20  Intake/Output from previous day: 05/27 0701 - 05/28 0700 In: 1062.2 [I.V.:1062.2] Out: 7654 [Urine:1000] Intake/Output this shift: No intake/output data recorded.  PE: Abd: soft, NT, drain in place with clear output.    Lab Results:  Recent Labs    11/07/20 0125 11/08/20 0305  WBC 3.7* 3.3*  HGB 7.3* 7.3*  HCT 23.5* 23.7*  PLT 193 198   BMET Recent Labs    11/07/20 0125 11/08/20 0305  NA 136 136  K 4.4 4.4  CL 108 106  CO2 22 20*  GLUCOSE 93 84  BUN 26* 18  CREATININE 3.42* 2.54*  CALCIUM 8.5* 8.5*   PT/INR Recent Labs    11/07/20 0125  LABPROT 13.8  INR 1.1   CMP     Component Value Date/Time   NA 136 11/08/2020 0305   K 4.4 11/08/2020 0305   CL 106 11/08/2020 0305   CO2 20 (L) 11/08/2020 0305   GLUCOSE 84 11/08/2020 0305   BUN 18 11/08/2020 0305   CREATININE 2.54 (H) 11/08/2020 0305   CREATININE 5.87 (H) 10/31/2020 1126   CALCIUM 8.5 (L) 11/08/2020 0305   PROT 7.5 11/05/2020 1725   ALBUMIN 3.4 (L) 11/05/2020 1725   AST 50 (H) 11/05/2020 1725   ALT 54 (H) 11/05/2020 1725   ALKPHOS 64 11/05/2020 1725   BILITOT 0.2 (L) 11/05/2020 1725   GFRNONAA 24 (L) 11/08/2020 0305   Lipase     Component Value Date/Time   LIPASE 34 11/05/2020 1725       Studies/Results: MR BRAIN WO CONTRAST  Result Date: 11/08/2020 CLINICAL DATA:  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 EXAM: MRI HEAD WITHOUT CONTRAST TECHNIQUE: Multiplanar, multiecho pulse sequences of the brain and  surrounding structures were obtained without intravenous contrast. COMPARISON:  Prior MRI from 10/01/2019 FINDINGS: Brain: Previously seen 2 ring-enhancing lesions involving the left frontal operculum and left temporal lobe are markedly improved as compared to previous exam, consistent with interval response to therapy. A small area of mild residual T2/FLAIR signal abnormality measuring up to 2.6 cm now seen at the level of the left frontal region (series 11, image 15). Small amount of associated T1 hyperintensity and susceptibility artifact, which could reflect blood products. Additional fairly mild residual FLAIR signal abnormality seen at the left temporal lesion as well (series 11, image 8). This lesion was the site of biopsy, with postsurgical changes seen within the overlying scalp and calvarium. 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 Previously seen vasogenic edema and mass effect is markedly improved, with no significant regional mass effect now seen. No midline shift. No other new lesions or other signal abnormality identified. No evidence for acute or subacute infarct. No other evidence for acute or chronic intracranial hemorrhage. 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 No other mass lesion or mass effect. No extra-axial fluid collection. Pituitary gland suprasellar region normal. Midline  structures intact. Vascular: Major intracranial vascular flow voids are maintained. Skull and upper cervical spine: Craniocervical junction normal. Diffusely decreased T1 signal intensity throughout the visualized bone marrow, likely related to anemia. Postoperative changes present at the left temporal region. Sinuses/Orbits: Globes and orbital soft tissues demonstrate no acute finding. Chronic frontoethmoidal and maxillary sinusitis noted, similar to previous.  No significant mastoid effusion. Inner ear structures grossly normal. Other: None. IMPRESSION: 1. Marked interval improvement in appearance of the previously identified two ring-enhancing lesions involving the left cerebral hemisphere, consistent with interval response to therapy. Residual mild localized signal change and edema at these locations without significant regional mass effect. 2. Sequelae of prior brain biopsy at the left temporal lesion, with associated small dural defect and meningoencephalocele. 3. No other new intracranial lesions or other abnormality identified. 4. Chronic frontoethmoidal and maxillary sinusitis, similar to previous. Electronically Signed   By: Jeannine Boga M.D.   On: 11/08/2020 04:14   CT IMAGE GUIDED DRAINAGE BY PERCUTANEOUS CATHETER  Result Date: 11/07/2020 INDICATION: 40 year old female with history of right perirectal abscess status post percutaneous drainage catheter placement 10/07/2020 which was subsequently removed earlier this month. Presents with recurrent pain secondary to reaccumulation of abscess. EXAM: CT IMAGE GUIDED DRAINAGE BY PERCUTANEOUS CATHETER COMPARISON:  None. MEDICATIONS: The patient is currently admitted to the hospital and receiving intravenous antibiotics. The antibiotics were administered within an appropriate time frame prior to the initiation of the procedure. ANESTHESIA/SEDATION: Moderate (conscious) sedation was employed during this procedure. A total of Versed 3 mg and Fentanyl 100 mcg was administered intravenously. Moderate Sedation Time: 10 minutes. The patient's level of consciousness and vital signs were monitored continuously by radiology nursing throughout the procedure under my direct supervision. CONTRAST:  None COMPLICATIONS: None immediate. PROCEDURE: Informed written consent was obtained from the patient after a discussion of the risks, benefits and alternatives to treatment. The patient was placed prone on the CT gantry and  a pre procedural CT was performed re-demonstrating the known abscess/fluid collection within the right sub gluteal, perirectal space. The procedure was planned. A timeout was performed prior to the initiation of the procedure. The medial aspect of the right gluteal region was prepped and draped in the usual sterile fashion. The overlying soft tissues were anesthetized with 1% lidocaine with epinephrine. Appropriate trajectory was planned with the use of a 22 gauge spinal needle. An 18 gauge trocar needle was advanced into the abscess/fluid collection and a short Amplatz super stiff wire was coiled within the collection. Appropriate positioning was confirmed with a limited CT scan. The tract was serially dilated allowing placement of a 10.2 [place] all-purpose drainage catheter. Appropriate positioning was confirmed with a limited postprocedural CT scan. Approximately 20 ml of purulent fluid was aspirated. The tube was connected to a bulb suction and sutured in place. A dressing was placed. The patient tolerated the procedure well without immediate post procedural complication. IMPRESSION: Successful CT guided placement of a 10.2 [place] all purpose drain catheter into the right perirectal abscess with aspiration of approximately 20 mL of purulent fluid. Samples were sent to the laboratory as requested by the ordering clinical team. Ruthann Cancer, MD [facility] Electronically Signed   By: Ruthann Cancer MD   On: 11/07/2020 13:47    Anti-infectives: Anti-infectives (From admission, onward)   Start     Dose/Rate Route Frequency Ordered Stop   11/07/20 1400  sulfaDIAZINE tablet 1,500 mg  Status:  Discontinued  1,500 mg Oral 4 times daily 11/07/20 1032 11/07/20 1254   11/07/20 1400  clindamycin (CLEOCIN) capsule 600 mg        600 mg Oral Every 6 hours 11/07/20 1254     11/07/20 0800  pyrimethamine (DARAPRIM) tablet 75 mg  Status:  Discontinued         75 mg Oral Daily with breakfast 11/06/20 0043 11/06/20 2011   11/06/20 2200  sulfaDIAZINE tablet 1,500 mg        1,500 mg Oral 4 times daily 11/06/20 1448 11/07/20 1006   11/06/20 2100  pyrimethamine (DARAPRIM) tablet 75 mg        75 mg Oral Daily with breakfast 11/06/20 2011     11/06/20 2000  dolutegravir (TIVICAY) tablet 50 mg        50 mg Oral Daily 11/06/20 1754     11/06/20 2000  lamiVUDine (EPIVIR) tablet 300 mg       Note to Pharmacy: Do NOT adjust dose based on renal fxn   300 mg Oral Daily 11/06/20 1754     11/06/20 1800  sulfaDIAZINE tablet 1,500 mg  Status:  Discontinued        1,500 mg Oral  Once 11/06/20 1321 11/07/20 0947   11/06/20 1030  sulfaDIAZINE tablet 1,500 mg        1,500 mg Oral  Once 11/06/20 1027 11/06/20 1156   11/06/20 1000  sulfaDIAZINE tablet 1,500 mg  Status:  Discontinued        1,500 mg Oral 4 times daily 11/06/20 0043 11/07/20 1032       Assessment/Plan MMP IV site pain - d/w RN  Pelvic floor fluid collection -s/p perc drain 5/27 -cultures pending, but may be negative again given abx she is already on -would have ID guide on further recommendations regarding this collection.  Unclear etiology and may benefit from drain injection prior to removal to confirm or deny if she has a connection from her rectum to this collection.  FEN - renal diet VTE - on hold ID - clindamycin   LOS: 2 days    Henreitta Cea , [facility] Surgery 11/08/2020, 10:44 AM Please see Amion for pager number during day hours 7:00am-4:30pm or 7:00am -11:30am on weekends

## 2020-11-08 NOTE — Progress Notes (Addendum)
Referring Physician(s): Wilmer Floor PA  Supervising Physician: Sandi Mariscal  Patient Status:  [facility] - In-pt  Chief Complaint:  Recurrent Pelvic abscess s/p right posterior perirectal abscess drain placement on 5.27.22 by Dr. Serafina Royals  Subjective:  Patient evaluated using the AMN interpretor service. Patient walking around room performing ADL's. She  states that she is "okay" and feeling better. She denies any questions or concerns  Allergies: Patient has no known allergies.  Medications: Prior to Admission medications   Medication Sig Start Date End Date Taking? Authorizing Provider  bictegravir-emtricitabine-tenofovir AF (BIKTARVY) 50-200-25 MG TABS tablet Take 1 tablet by mouth daily. 10/17/20  Yes Fulda Callas, NP  docusate sodium (COLACE) 100 MG capsule Take 1 capsule (100 mg total) by mouth 2 (two) times daily. 10/10/20  Yes Bonnielee Haff, MD  ferrous sulfate 325 (65 FE) MG tablet Take 1 tablet (325 mg total) by mouth daily with breakfast. 10/10/20 01/08/21 Yes [place] Donnamarie Poag, DO  ibuprofen (ADVIL) 200 MG tablet Take 400 mg by mouth every 6 (six) hours as needed for headache or moderate pain.   Yes [provider]  levETIRAcetam (KEPPRA) 750 MG tablet Take 1 tablet (750 mg total) by mouth 2 (two) times daily. 10/31/20 01/29/21 Yes [place] Callas, NP  promethazine (PHENERGAN) 12.5 MG tablet Take 1 tablet (12.5 mg total) by mouth every 6 (six) hours as needed for nausea or vomiting. 10/31/20  Yes Kuppelweiser, Cassie L, RPH-CPP  sulfaDIAZINE 500 MG tablet Take 3 tablets (1,500 mg total) by mouth 4 (four) times daily for 12 days. 11/07/20 11/19/20 Yes [place] Callas, NP  leucovorin (WELLCOVORIN) 25 MG tablet Take 1 tablet (25 mg total) by mouth daily. 11/06/20   Kuppelweiser, Cassie L, RPH-CPP  omeprazole (PRILOSEC) 20 MG capsule Take 1 capsule (20 mg total) by mouth daily. 11/06/20   Kuppelweiser, Cassie L, RPH-CPP  pyrimethamine (DARAPRIM) 25 MG tablet Take 3 tablets  (75 mg total) by mouth daily with breakfast. 11/07/20   Kuppelweiser, Cassie L, RPH-CPP     Vital Signs: BP 121/86 (BP Location: Right Arm)   Pulse 86   Temp 98.3 F (36.8 C) (Oral)   Resp 18   SpO2 95%   Physical Exam Vitals and nursing note reviewed.  Constitutional:      Appearance: She is well-developed.  HENT:     Head: Normocephalic and atraumatic.  Eyes:     Conjunctiva/sclera: Conjunctivae normal.  Pulmonary:     Effort: Pulmonary effort is normal.  Abdominal:     Comments: Positive right transgluteal drain to JP. site is unremarkable with no erythema, edema, tenderness, bleeding or drainage noted at exit site. Suture and stat lock in place. Dressing is clean dry and intact. 3 ml of  serosanguinous colored fluid noted in bulb suction device. Drain is able to be flushed easily.   Musculoskeletal:        General: Normal range of motion.     Cervical back: Normal range of motion.  Skin:    General: Skin is warm.  Neurological:     Mental Status: She is alert and oriented to person, place, and time.     Imaging: CT ABDOMEN PELVIS WO CONTRAST  Result Date: 11/05/2020 CLINICAL DATA:  Back and flank pain, renal failure, nausea and vomiting for 2 days, dysuria and EXAM: CT ABDOMEN AND PELVIS WITHOUT CONTRAST TECHNIQUE: Multidetector CT imaging of the abdomen and pelvis was performed following the standard protocol without IV contrast. COMPARISON:  CT 10/17/2020 MR  10/02/2020 FINDINGS: Motion degradation of the lung bases and upper abdomen likely related to respiratory motion artifact. Lower chest: Lung bases appear grossly clear. Borderline cardiomegaly. Hypoattenuation of the cardiac blood pool often reflective of anemia. Trace pericardial effusion. Hepatobiliary: No visible focal liver lesion. Smooth liver surface contour. Normal hepatic attenuation. Gallbladder largely decompressed at the time of exam. Gross gallbladder abnormality is seen. No visible calcified gallstones. No  discernible biliary ductal dilatation though evaluation limited upper motion artifact. Pancreas: No discernible pancreatic mass or lesion. No peripancreatic stranding. No visible ductal dilatation. Spleen: Normal in size. No concerning splenic lesions. Adrenals/Urinary Tract: Adrenal glands are difficult to fully discern in the absence of contrast media given extensive motion artifact. Some layering hyperdensity is seen within the proximal collecting system bilaterally. Given symmetry of this finding, could reflect excreted contrast media versus is milk of calcium. A relatively brush like appearance argues against discrete urolithiasis though may be accentuated by motion artifact. Mild to moderate bilateral hydroureteronephrosis. Visible distal ureteral calculi. Urinary bladder is largely decompressed at the time of exam and therefore poorly evaluated by CT imaging. Stomach/Bowel: Challenging assessment the bowel in the absence of contrast media or enteric contrast. No gross acute abnormality is seen of the distal thoracic esophagus, stomach or duodenum. Multiple fluid-filled loops of small bowel are present without gross distention. No discernible bowel wall thickening. Moderate to large colonic stool burden. Discrete colonic wall thickening dilatation or dilatation. No CT features of high-grade obstruction. Vascular/Lymphatic: Limited assessment of the vasculature in the absence of contrast media no aneurysm or ectasia is evident. Numerous pelvic phleboliths. Reproductive: Markedly enlarged, heterogeneous uterus compatible the numerous large fibroid seen on comparison CT and MR imaging, better characterized on those exams. No gross interval change from comparison prior. No discrete uterine masses are observable within the limitations of this unenhanced exam. Other: Small fluid attenuation along the right pelvic floor appears to have reaccumulated in the interim from comparison prior CT following removal of a  drainage catheter on the CT images dated 10/17/2020. This collection measures 4.4 x 2.2 x 4.3 cm. Some mild surrounding stranding is nonspecific. Musculoskeletal: No acute osseous abnormality or suspicious osseous lesion. Stable chronic angulation of the sacrococcygeal junction. 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 Minimal degenerative changes the spine, hips pelvis. IMPRESSION: 1. Evaluation is complicated by an absence of intravenous contrast media, extensive respiratory motion artifact and a paucity of intraperitoneal fat, all of which may contribute to limited detection of subtle abnormalities. 2. Reaccumulation of the thick-walled collection along the right pelvic floor following removal of a drainage catheter seen on comparison CT imaging concerning for recurrent perirectal abscess. 3. Markedly enlarged, heterogeneous uterus with multiple dural mass is suspicious for uterine fibroids though underlying leiomyosarcoma is not fully excluded. Findings are better detailed on comparison MR imaging. 4. Persistent and slightly increased mild to moderate hydroureteronephrosis at least to the level of the markedly enlarged uterus, possibly secondary to mass effect. Can certainly result in some diminished renal function. Increased attenuation dependently within the collecting systems could reflect excreted contrast media versus milk of calcium or urolithiasis with motion artifact. 5. Hypoattenuation of the cardiac blood pool, often reflective of anemia. 6. Trace pericardial effusion. Electronically Signed   By: Lovena Le M.D.   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 EXAM: MRI HEAD WITHOUT CONTRAST TECHNIQUE: Multiplanar, multiecho  pulse sequences of the brain and surrounding structures were obtained without intravenous contrast. COMPARISON:  Prior MRI  from 10/01/2019 FINDINGS: Brain: Previously seen 2 ring-enhancing lesions involving the left frontal operculum and left temporal lobe are markedly improved as compared to previous exam, consistent with interval response to therapy. A small area of mild residual T2/FLAIR signal abnormality measuring up to 2.6 cm now seen at the level of the left frontal region (series 11, image 15). Small amount of associated T1 hyperintensity and susceptibility artifact, which could reflect blood products. Additional fairly mild residual FLAIR signal abnormality seen at the left temporal lesion as well (series 11, image 8). This lesion was the site of biopsy, with postsurgical changes seen within the overlying scalp and calvarium. 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 Previously seen vasogenic edema and mass effect is markedly improved, with no significant regional mass effect now seen. No midline shift. No other new lesions or other signal abnormality identified. No evidence for acute or subacute infarct. No other evidence for acute or chronic intracranial hemorrhage. 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 No other mass lesion or mass effect. No extra-axial fluid collection. Pituitary gland suprasellar region normal. Midline structures intact. Vascular: Major intracranial vascular flow voids are maintained. Skull and upper cervical spine: Craniocervical junction normal. Diffusely decreased T1 signal intensity throughout the visualized bone marrow, likely related to anemia. Postoperative changes present at the left temporal region. Sinuses/Orbits: Globes and orbital soft tissues demonstrate no acute finding. Chronic frontoethmoidal and maxillary sinusitis noted, similar to previous. No significant mastoid effusion. Inner ear structures grossly normal. Other: None.  IMPRESSION: 1. Marked interval improvement in appearance of the previously identified two ring-enhancing lesions involving the left cerebral hemisphere, consistent with interval response to therapy. Residual mild localized signal change and edema at these locations without significant regional mass effect. 2. Sequelae of prior brain biopsy at the left temporal lesion, with associated small dural defect and meningoencephalocele. 3. No other new intracranial lesions or other abnormality identified. 4. Chronic frontoethmoidal and maxillary sinusitis, similar to previous. Electronically Signed   By: Jeannine Boga M.D.   On: 11/08/2020 04:14   CT IMAGE GUIDED DRAINAGE BY PERCUTANEOUS CATHETER  Result Date: 11/07/2020 INDICATION: 40 year old female with history of right perirectal abscess status post percutaneous drainage catheter placement 10/07/2020 which was subsequently removed earlier this month. Presents with recurrent pain secondary to reaccumulation of abscess. EXAM: CT IMAGE GUIDED DRAINAGE BY PERCUTANEOUS CATHETER COMPARISON:  None. MEDICATIONS: The patient is currently admitted to the hospital and receiving intravenous antibiotics. The antibiotics were administered within an appropriate time frame prior to the initiation of the procedure. ANESTHESIA/SEDATION: Moderate (conscious) sedation was employed during this procedure. A total of Versed 3 mg and Fentanyl 100 mcg was administered intravenously. Moderate Sedation Time: 10 minutes. The patient's level of consciousness and vital signs were monitored continuously by radiology nursing throughout the procedure under my direct supervision. CONTRAST:  None COMPLICATIONS: None immediate. PROCEDURE: Informed written consent was obtained from the patient after a discussion of the risks, benefits and alternatives to treatment. The patient was placed prone on the CT gantry and a pre procedural CT was performed re-demonstrating the known abscess/fluid  collection within the right sub gluteal, perirectal space. The procedure was planned. A timeout was performed prior to the initiation of the procedure. The medial aspect of the right gluteal  region was prepped and draped in the usual sterile fashion. The overlying soft tissues were anesthetized with 1% lidocaine with epinephrine. Appropriate trajectory was planned with the use of a 22 gauge spinal needle. An 18 gauge trocar needle was advanced into the abscess/fluid collection and a short Amplatz super stiff wire was coiled within the collection. Appropriate positioning was confirmed with a limited CT scan. The tract was serially dilated allowing placement of a 10.2 [place] all-purpose drainage catheter. Appropriate positioning was confirmed with a limited postprocedural CT scan. Approximately 20 ml of purulent fluid was aspirated. The tube was connected to a bulb suction and sutured in place. A dressing was placed. The patient tolerated the procedure well without immediate post procedural complication. IMPRESSION: Successful CT guided placement of a 10.2 [place] all purpose drain catheter into the right perirectal abscess with aspiration of approximately 20 mL of purulent fluid. Samples were sent to the laboratory as requested by the ordering clinical team. Ruthann Cancer, MD [facility] Electronically Signed   By: Ruthann Cancer MD   On: 11/07/2020 13:47    Labs:  CBC: Recent Labs    11/05/20 1725 11/06/20 0208 11/07/20 0125 11/08/20 0305  WBC 3.3* 3.8* 3.7* 3.3*  HGB 8.2* 7.1* 7.3* 7.3*  HCT 27.3* 23.8* 23.5* 23.7*  PLT 204 186 193 198    COAGS: Recent Labs    09/29/20 1814 11/07/20 0125  INR 1.1 1.1  APTT 23*  --     BMP: Recent Labs    11/05/20 1725 11/06/20 0208 11/07/20 0125 11/08/20 0305  NA 135 138 136 136  K 5.2* 4.2 4.4 4.4  CL 107 109 108 106  CO2 21* 22 22 20*  GLUCOSE 100* 110* 93 84  BUN 26* 28* 26* 18   CALCIUM 8.7* 8.8* 8.5* 8.5*  CREATININE 4.14* 4.40* 3.42* 2.54*  GFRNONAA 13* 12* 17* 24*    LIVER FUNCTION TESTS: Recent Labs    09/29/20 1814 09/30/20 0555 10/13/20 1622 10/31/20 1126 11/05/20 1725  BILITOT 0.6 0.7 0.3 0.3 0.2*  AST 41 33 34 35* 50*  ALT 27 24 40 39* 54*  ALKPHOS 66 60 56  --  64  PROT 9.7* 9.0* 8.2* 7.2 7.5  ALBUMIN 3.6 3.2* 3.5  --  3.4*    Assessment and Plan:  40 y.o. [place] speaking female. History of HIV with toxoplasma encephalitis. AKI, anemia, and hyperkalemia. Presented to the ED at [facility] in April 2022 with abdominal pain, nausea and vomiting on 5.2.22. Patient was found to have perirectal cystic fluid collections. IR placed an perirectal abscess drain  on 4.26.22 (Cultures negative). CT scan showed the abscess resolved and at the drain was removed on 5.6.22. The patient presented to the ED At [facility] on 5.25.22 due to abnormal BUN, Cr and a decrease in GFR. CT abdomen pelvis from 5.25.22 reads Re accumulation of the thick-walled collection along the right pelvic floor following removal of a drainage catheter seen on comparison CT imaging concerning for recurrent perirectal abscess. On 5.27.22 IR placed a  10.2 APDL to right perirectal abscess with aspiration of 20 ml of purulent fluid. 3 ml of serosanginous output noted to be in the JP drain. Dressing clean dry and intact. Output documented in Epic 15 ml, WBC is 3.3, Hgb 7.3, Cr 2.54, Cultures pending.  Recommend team continue with flushing TID, output recording q shift and dressing changes as needed. Would consider additional imaging when output is less than 10 ml for  24 hours not including flush material.  If patient is to be discharged, below are discharge instructions: - Flush each drain once daily with 5-10 cc NS flush (patient will need order for flushes upon discharge). - Record output from each drain once daily. - Follow-up at drain clinic 10-14 days after discharge for CT/possible drain injection (assess  for possible drain removal)- order placed to facilitate this.   Please call IR with questions/concerns.   Continue current treatment plans as per Primary Team and General Surgery.   Electronically Signed: Jacqualine Mau, NP 11/08/2020, 7:54 AM   I spent a total of 15 Minutes at the patient's bedside AND on the patient's hospital floor or unit, greater than 50% of which was counseling/coordinating care for right posterior perirectal abscess drain placement

## 2020-11-08 NOTE — Progress Notes (Addendum)
[facility] Progress Note  Name: Candace Jones MRN: [id number] DOB: 11-12-1980  Chief Complaint:  Sent for outpatient labs  Subjective:  Interviewed with aid of [place] audio interpreter - no video available and three different interpreters used as disconnnected.   She had 1 liter UOP over 5/27 charted.  Had a drain placed yesterday with IR for perirectal abscess.   She feels ok today.   Review of systems:   Denies shortness of breath or chest pain  No n/v --------- Background on consult:  Candace Jones is a 40 y.o. 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  She had Cr 0.71 end of April as below then increase to 1.62 on 5/2 and to 5.87 on 5/20.  Labs here a little better but still with AKI.  She has also been using ibuprofen for headache and states taking BID.  Previously took [employer] as well.  Work-up here demonstrated an enlarged uterus with possible mass effect and slightly increased mild to moderate hydroureteronephrosis.  Also with concern for recurrent perirectal abscess.  Had hematuria but note she also reported being on her period; LMP last Sunday and her period ended on 5/25.  She has been on LR at 125 ml/hr and got 1 liter NS in the ER.  Her sister is here and supplements history.    Intake/Output Summary (Last 24 hours) at 11/08/2020 0950 Last data filed at 11/08/2020 0500 Gross per 24 hour  Intake 1062.24 ml  Output 1015 ml  Net 47.24 ml    Vitals:  Vitals:   11/07/20 1153 11/07/20 1452 11/07/20 2111 11/08/20 0403  BP: (!) 132/98 126/89 116/88 121/86  Pulse: 76 86    Resp: 15 18 18 18   Temp:  97.7 F (36.5 C) 97.7 F (36.5 C) 98.3 F (36.8 C)  TempSrc:  Oral Oral Oral  SpO2: 99%  98% 95%     Physical Exam:  General adult female in bed in no acute distress HEENT normocephalic atraumatic extraocular movements intact sclera anicteric Neck supple trachea  midline Lungs clear to auscultation bilaterally normal work of breathing at rest  Heart S1S2 no rub Abdomen soft nontender nondistended Extremities no edema  Psych normal mood and affect Neuro alert and oriented x 3 provides hx and follows commands  Medications reviewed   Labs:  BMP Latest Ref Rng & Units 11/08/2020 11/07/2020 11/06/2020  Glucose 70 - 99 mg/dL 84 93 110(H)  BUN 6 - 20 mg/dL 18 26(H) 28(H)  Creatinine 0.44 - 1.00 mg/dL 2.54(H) 3.42(H) 4.40(H)  BUN/Creat Ratio 6 - 22 (calc) - - -  Sodium 135 - 145 mmol/L 136 136 138  Potassium 3.5 - 5.1 mmol/L 4.4 4.4 4.2  Chloride 98 - 111 mmol/L 106 108 109  CO2 22 - 32 mmol/L 20(L) 22 22  Calcium 8.9 - 10.3 mg/dL 8.5(L) 8.5(L) 8.8(L)     Assessment/Plan:   # AKI  - Secondary to tenofovir and cannot rule out contribution of the hydroureteronephrosis  - resume NS at 75/hr x 24 hours - Urology as below - please do not give any compounds with tenofovir - strict ins/outs  # AIDS - regimen per ID - Would defer any compounds with tenofovir   # Mild to moderate bilateral hydroureteronephrosis  - May be from mass effect from enlarged uterus  - recommended contacting urology and GYN.  Appreciate primary team.  See that urology recommended outpatient follow-up and per  team gyn was consulted  # Anemia microcytic  - transfuse PRBC's.  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  Renal function improving with supportive care.  Should renal impairment persist on PCP follow-up then please refer back to nephrology   Claudia Desanctis, MD 11/08/2020 9:50 AM

## 2020-11-08 NOTE — Consult Note (Signed)
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 female with medical history significant for HIV and recent toxoplasma meningoencephalitis who presents with a worsening AKI.   On review of the previous records Mrs. Ganas has not had a normal Hgb on record. Her labs begin with a Hgb 6.6 on 09/29/2020. She has had normal Plt count and microcytosis throughout that time. Her WBC has been low normal/normal. She has poorly controlled HIV for which ID is following. She does not have any lymphadenopathy on imaging of the Chest,abdomen, or pelvis during the last 2 months. Her last Reticulocyte panel showed severe underproduction of RBC. Due to concern for these findings hematology was consulted for further evaluation and management.   On exam today communication is assisted by a hospital approved telephone translator. Unfortunately [place] is a second language for this patient, so communication remains difficult. She reports no history of blood problems or bleeding. She notes the HIV diagnosis is new. She does not have any dietary restrictions. She endorses having fever previously, but not today or while in the hospital. No other focal infections symptoms. A focused ROS is noted below.   MEDICAL HISTORY:  Past Medical History:  Diagnosis Date  . AIDS (acquired immune deficiency syndrome) ([place])   . 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 APPLICATION OF CRANIAL NAVIGATION Left 10/02/2020   Procedure: APPLICATION OF CRANIAL NAVIGATION;  Surgeon: Newman Pies, MD;  Location: [place];  Service: Neurosurgery;  Laterality: Left;  .  CRANIOTOMY Left 10/02/2020   Procedure: LEFT CRANIOTOMY FOR TUMOR BIOPSY/ RESECTION with BrainLab;  Surgeon: Newman Pies, MD;  Location: [place];  Service: Neurosurgery;  Laterality: Left;    SOCIAL HISTORY: Social History   Socioeconomic History  . Marital status: Single    Spouse name: Not on file  . Number of children: Not on file  . Years of education: Not on file  . Highest education level: Not on file  Occupational History  . Not on file  Tobacco Use  . Smoking status: Never Smoker  . Smokeless tobacco: Never Used  Substance and Sexual Activity  . Alcohol use: Never  . Drug use: Never  . Sexual activity: Yes  Other Topics Concern  . Not on file  Social History Narrative  . Not on file   Social Determinants of Health   Financial Resource Strain: Not on file  Food Insecurity: Not on file  Transportation Needs: Not on file  Physical Activity: Not on file  Stress: Not on file  Social Connections: Not on file  Intimate Partner Violence: Not on file    FAMILY HISTORY: Family History  Family history unknown: Yes    ALLERGIES:  has No Known Allergies.  MEDICATIONS:  Current Facility-Administered Medications  Medication Dose Route Frequency Provider Last Rate Last Admin  . 0.9 %  sodium chloride infusion (Manually program via Guardrails IV Fluids)   Intravenous Once Harrie Jeans C, MD      . 0.9 %  sodium chloride infusion   Intravenous Continuous Claudia Desanctis, MD      . acetaminophen (TYLENOL) tablet 650 mg  650 mg Oral Q6H PRN Reubin Milan, MD   [phone]  mg at 11/08/20 0541   Or  . acetaminophen (TYLENOL) suppository 650 mg  650 mg Rectal Q6H PRN Reubin Milan, MD      . clindamycin (CLEOCIN) capsule 600 mg  600 mg Oral Q6H Mahaffey Callas, NP   600 mg at 11/08/20 0536  . docusate sodium (COLACE) capsule 100 mg  100 mg Oral BID Reubin Milan, MD   100 mg at 11/08/20 5885  . dolutegravir (TIVICAY) tablet 50 mg  50 mg Oral Daily Tommy Medal,  Lavell Islam, MD   50 mg at 11/08/20 0277  . ferrous sulfate tablet 325 mg  325 mg Oral Q breakfast Reubin Milan, MD   325 mg at 11/08/20 4128  . lamiVUDine (EPIVIR) tablet 300 mg  300 mg Oral Daily Tommy Medal, Lavell Islam, MD   300 mg at 11/08/20 7867  . leucovorin (WELLCOVORIN) tablet 25 mg  25 mg Oral Daily Reubin Milan, MD   25 mg at 11/08/20 0930  . levETIRAcetam (KEPPRA) tablet 750 mg  750 mg Oral BID Reubin Milan, MD   750 mg at 11/08/20 6720  . pantoprazole (PROTONIX) EC tablet 40 mg  40 mg Oral QHS Reubin Milan, MD   40 mg at 11/07/20 2231  . promethazine (PHENERGAN) tablet 12.5 mg  12.5 mg Oral Q6H PRN Reubin Milan, MD   12.5 mg at 11/08/20 9470  . pyrimethamine (DARAPRIM) tablet 75 mg  75 mg Oral Q breakfast Mariel Aloe, MD   75 mg at 11/08/20 0930  . sodium chloride flush (NS) 0.9 % injection 5 mL  5 mL Intracatheter Q8H Suttle, Rosanne Ashing, MD   5 mL at 11/08/20 0536    REVIEW OF SYSTEMS:   Constitutional: ( - ) fevers, ( - )  chills , ( - ) night sweats Eyes: ( - ) blurriness of vision, ( - ) double vision, ( - ) watery eyes Ears, nose, mouth, throat, and face: ( - ) mucositis, ( - ) sore throat Respiratory: ( - ) cough, ( - ) dyspnea, ( - ) wheezes Cardiovascular: ( - ) palpitation, ( - ) chest discomfort, ( - ) lower extremity swelling Gastrointestinal:  ( - ) nausea, ( - ) heartburn, ( - ) change in bowel habits Skin: ( - ) abnormal skin rashes Lymphatics: ( - ) new lymphadenopathy, ( - ) easy bruising Neurological: ( - ) numbness, ( - ) tingling, ( - ) new weaknesses Behavioral/Psych: ( - ) mood change, ( - ) new changes  All other systems were reviewed with the patient and are negative.  PHYSICAL EXAMINATION:  Vitals:   11/07/20 2111 11/08/20 0403  BP: 116/88 121/86  Pulse:    Resp: 18 18  Temp: 97.7 F (36.5 C) 98.3 F (36.8 C)  SpO2: 98% 95%   There were no vitals filed for this visit.  GENERAL: well appearing middle aged  African female in NAD  SKIN: skin color, texture, turgor are normal, no rashes or significant lesions EYES: conjunctiva are pink and non-injected, sclera clear NECK: supple, non-tender LYMPH:  no palpable lymphadenopathy in the cervical, axillary or supraclavicular lymph nodes.  LUNGS: clear to auscultation and percussion with normal breathing effort HEART: regular rate & rhythm and no murmurs and no lower extremity edema Musculoskeletal: no cyanosis of digits and no clubbing  PSYCH: alert & oriented x 3, fluent speech NEURO: no focal motor/sensory deficits  LABORATORY DATA:  I have reviewed the data  as listed CBC Latest Ref Rng & Units 11/08/2020 11/07/2020 11/06/2020  WBC 4.0 - 10.5 K/uL 3.3(L) 3.7(L) 3.8(L)  Hemoglobin 12.0 - 15.0 g/dL 7.3(L) 7.3(L) 7.1(L)  Hematocrit 36.0 - 46.0 % 23.7(L) 23.5(L) 23.8(L)  Platelets 150 - 400 K/uL 198 193 186    CMP Latest Ref Rng & Units 11/08/2020 11/07/2020 11/06/2020  Glucose 70 - 99 mg/dL 84 93 110(H)  BUN 6 - 20 mg/dL 18 26(H) 28(H)  Creatinine 0.44 - 1.00 mg/dL 2.54(H) 3.42(H) 4.40(H)  Sodium 135 - 145 mmol/L 136 136 138  Potassium 3.5 - 5.1 mmol/L 4.4 4.4 4.2  Chloride 98 - 111 mmol/L 106 108 109  CO2 22 - 32 mmol/L 20(L) 22 22  Calcium 8.9 - 10.3 mg/dL 8.5(L) 8.5(L) 8.8(L)  Total Protein 6.5 - 8.1 g/dL - - -  Total Bilirubin 0.3 - 1.2 mg/dL - - -  Alkaline Phos 38 - 126 U/L - - -  AST 15 - 41 U/L - - -  ALT 0 - 44 U/L - - -    RADIOGRAPHIC STUDIES: CT ABDOMEN PELVIS WO CONTRAST  Result Date: 11/05/2020 CLINICAL DATA:  Back and flank pain, renal failure, nausea and vomiting for 2 days, dysuria and EXAM: CT ABDOMEN AND PELVIS WITHOUT CONTRAST TECHNIQUE: Multidetector CT imaging of the abdomen and pelvis was performed following the standard protocol without IV contrast. COMPARISON:  CT 10/17/2020 MR 10/02/2020 FINDINGS: Motion degradation of the lung bases and upper abdomen likely related to respiratory motion artifact. Lower chest: Lung  bases appear grossly clear. Borderline cardiomegaly. Hypoattenuation of the cardiac blood pool often reflective of anemia. Trace pericardial effusion. Hepatobiliary: No visible focal liver lesion. Smooth liver surface contour. Normal hepatic attenuation. Gallbladder largely decompressed at the time of exam. Gross gallbladder abnormality is seen. No visible calcified gallstones. No discernible biliary ductal dilatation though evaluation limited upper motion artifact. Pancreas: No discernible pancreatic mass or lesion. No peripancreatic stranding. No visible ductal dilatation. Spleen: Normal in size. No concerning splenic lesions. Adrenals/Urinary Tract: Adrenal glands are difficult to fully discern in the absence of contrast media given extensive motion artifact. Some layering hyperdensity is seen within the proximal collecting system bilaterally. Given symmetry of this finding, could reflect excreted contrast media versus is milk of calcium. A relatively brush like appearance argues against discrete urolithiasis though may be accentuated by motion artifact. Mild to moderate bilateral hydroureteronephrosis. Visible distal ureteral calculi. Urinary bladder is largely decompressed at the time of exam and therefore poorly evaluated by CT imaging. Stomach/Bowel: Challenging assessment the bowel in the absence of contrast media or enteric contrast. No gross acute abnormality is seen of the distal thoracic esophagus, stomach or duodenum. Multiple fluid-filled loops of small bowel are present without gross distention. No discernible bowel wall thickening. Moderate to large colonic stool burden. Discrete colonic wall thickening dilatation or dilatation. No CT features of high-grade obstruction. Vascular/Lymphatic: Limited assessment of the vasculature in the absence of contrast media no aneurysm or ectasia is evident. Numerous pelvic phleboliths. Reproductive: Markedly enlarged, heterogeneous uterus compatible the numerous  large fibroid seen on comparison CT and MR imaging, better characterized on those exams. No gross interval change from comparison prior. No discrete uterine masses are observable within the limitations of this unenhanced exam. Other: Small fluid attenuation along the right pelvic floor appears to have reaccumulated in the interim from comparison prior CT following removal of a drainage catheter on the CT images dated 10/17/2020. This collection measures 4.4 x 2.2 x 4.3 cm. Some mild  surrounding stranding is nonspecific. Musculoskeletal: No acute osseous abnormality or suspicious osseous lesion. Stable chronic angulation of the sacrococcygeal junction. 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 Minimal degenerative changes the spine, hips pelvis. IMPRESSION: 1. Evaluation is complicated by an absence of intravenous contrast media, extensive respiratory motion artifact and a paucity of intraperitoneal fat, all of which may contribute to limited detection of subtle abnormalities. 2. Reaccumulation of the thick-walled collection along the right pelvic floor following removal of a drainage catheter seen on comparison CT imaging concerning for recurrent perirectal abscess. 3. Markedly enlarged, heterogeneous uterus with multiple dural mass is suspicious for uterine fibroids though underlying leiomyosarcoma is not fully excluded. Findings are better detailed on comparison MR imaging. 4. Persistent and slightly increased mild to moderate hydroureteronephrosis at least to the level of the markedly enlarged uterus, possibly secondary to mass effect. Can certainly result in some diminished renal function. Increased attenuation dependently within the collecting systems could reflect excreted contrast media versus milk of calcium or urolithiasis with motion artifact. 5. Hypoattenuation of the cardiac blood pool, often reflective of anemia. 6. Trace pericardial effusion. Electronically Signed   By: Lovena Le M.D.   On:  11/05/2020 22:19   CT PELVIS W CONTRAST  Result Date: 10/17/2020 CLINICAL DATA:  Status post CT-guided percutaneous catheter drainage of right-sided perirectal abscess on 10/07/2020. There is decrease output from the drain and reflux of fluid from the catheter exit site with flushing. EXAM: CT PELVIS WITH CONTRAST TECHNIQUE: Multidetector CT imaging of the pelvis was performed using the standard protocol following the bolus administration of intravenous contrast. CONTRAST:  92mL OMNIPAQUE IOHEXOL 300 MG/ML  SOLN COMPARISON:  10/07/2020 FINDINGS: Urinary Tract:  No abnormality visualized. Bowel: Unremarkable visualized pelvic bowel loops. No free intraperitoneal air. Vascular/Lymphatic: No pathologically enlarged lymph nodes. No significant vascular abnormality seen. Reproductive: Stable uterine enlargement from multiple underlying fibroids. Other: Right-sided perirectal drainage catheter remains present with resolution of perirectal abscess. No new fluid collections. No abnormal fluid in the pelvis. Musculoskeletal: No suspicious bone lesions identified. IMPRESSION: Resolution of right perirectal abscess after catheter drainage. Electronically Signed   By: Aletta Edouard M.D.   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 EXAM: MRI HEAD WITHOUT CONTRAST TECHNIQUE: Multiplanar, multiecho pulse sequences of the brain and surrounding structures were obtained without intravenous contrast. COMPARISON:  Prior MRI from 10/01/2019 FINDINGS: Brain: Previously seen 2 ring-enhancing lesions involving the left frontal operculum and left temporal lobe are markedly improved as compared to previous exam, consistent with interval response to therapy. A small area of mild residual T2/FLAIR signal abnormality measuring up to 2.6 cm now seen at the level of the left frontal region  (series 11, image 15). Small amount of associated T1 hyperintensity and susceptibility artifact, which could reflect blood products. Additional fairly mild residual FLAIR signal abnormality seen at the left temporal lesion as well (series 11, image 8). This lesion was the site of biopsy, with postsurgical changes seen within the overlying scalp and calvarium. 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 Previously seen vasogenic edema and mass effect is markedly improved, with no significant regional mass effect now seen. No midline shift. No other new lesions or other signal abnormality identified. No evidence for acute or subacute infarct. No other evidence for acute or chronic  intracranial hemorrhage. 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 No other mass lesion or mass effect. No extra-axial fluid collection. Pituitary gland suprasellar region normal. Midline structures intact. Vascular: Major intracranial vascular flow voids are maintained. Skull and upper cervical spine: Craniocervical junction normal. Diffusely decreased T1 signal intensity throughout the visualized bone marrow, likely related to anemia. Postoperative changes present at the left temporal region. Sinuses/Orbits: Globes and orbital soft tissues demonstrate no acute finding. Chronic frontoethmoidal and maxillary sinusitis noted, similar to previous. No significant mastoid effusion. Inner ear structures grossly normal. Other: None. IMPRESSION: 1. Marked interval improvement in appearance of the previously identified two ring-enhancing lesions involving the left cerebral hemisphere, consistent with interval response to therapy. Residual mild localized signal change and edema at these locations without significant regional mass effect. 2. Sequelae of prior brain biopsy at the left temporal lesion,  with associated small dural defect and meningoencephalocele. 3. No other new intracranial lesions or other abnormality identified. 4. Chronic frontoethmoidal and maxillary sinusitis, similar to previous. Electronically Signed   By: Jeannine Boga M.D.   On: 11/08/2020 04:14   CT IMAGE GUIDED DRAINAGE BY PERCUTANEOUS CATHETER  Result Date: 11/07/2020 INDICATION: 40 year old female with history of right perirectal abscess status post percutaneous drainage catheter placement 10/07/2020 which was subsequently removed earlier this month. Presents with recurrent pain secondary to reaccumulation of abscess. EXAM: CT IMAGE GUIDED DRAINAGE BY PERCUTANEOUS CATHETER COMPARISON:  None. MEDICATIONS: The patient is currently admitted to the hospital and receiving intravenous antibiotics. The antibiotics were administered within an appropriate time frame prior to the initiation of the procedure. ANESTHESIA/SEDATION: Moderate (conscious) sedation was employed during this procedure. A total of Versed 3 mg and Fentanyl 100 mcg was administered intravenously. Moderate Sedation Time: 10 minutes. The patient's level of consciousness and vital signs were monitored continuously by radiology nursing throughout the procedure under my direct supervision. CONTRAST:  None COMPLICATIONS: None immediate. PROCEDURE: Informed written consent was obtained from the patient after a discussion of the risks, benefits and alternatives to treatment. The patient was placed prone on the CT gantry and a pre procedural CT was performed re-demonstrating the known abscess/fluid collection within the right sub gluteal, perirectal space. The procedure was planned. A timeout was performed prior to the initiation of the procedure. The medial aspect of the right gluteal region was prepped and draped in the usual sterile fashion. The overlying soft tissues were anesthetized with 1% lidocaine with epinephrine. Appropriate trajectory was planned with the use  of a 22 gauge spinal needle. An 18 gauge trocar needle was advanced into the abscess/fluid collection and a short Amplatz super stiff wire was coiled within the collection. Appropriate positioning was confirmed with a limited CT scan. The tract was serially dilated allowing placement of a 10.2 [place] all-purpose drainage catheter. Appropriate positioning was confirmed with a limited postprocedural CT scan. Approximately 20 ml of purulent fluid was aspirated. The tube was connected to a bulb suction and sutured in place. A dressing was placed. The patient tolerated the procedure well without immediate post procedural complication. IMPRESSION: Successful CT guided placement of a 10.2 [place] all purpose drain catheter into the right perirectal abscess with aspiration of approximately 20 mL of purulent fluid. Samples were sent to the laboratory as requested by the ordering clinical team. Ruthann Cancer, MD [facility] Electronically Signed   By: Ruthann Cancer MD   On: 11/07/2020 13:47  [place] 40 y.o. female with medical history significant for HIV and recent toxoplasma meningoencephalitis who presents with a worsening AKI.   After review of the labs, review of the records, and discussion with the patient the patients findings are most consistent with a severe hypoproliferative anemia. The etiology of these findings is not clear, though poorly controlled HIV could certainly be a cause. We will r/o other possible etiologies with the workup below.   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 Differential diagnosis is broad but could include HIV induced anemia, co-infection with Parvo B19, severe nutritional deficiency, or primary bone marrow disorder/hematological malignancy.  --will start with battery of labs to include MMA,  homocysteine, Parvo B19- serology/PCR, LDH, and haptoglobin --may need to consider bone marrow biopsy if no clear etiology can be determined -- extensive imaging of the abdomen/pelvis during this admission does not show any evidence of lymphadenopathy. Prior imaging from 09/29/2020 shows no concerning findings in the chest.  --recommend transfusion for Hgb <7.0 or if symptomatic at Hgb <8.0 --Hematology will continue to follow.    All questions were answered. The patient knows to call the clinic with any problems, questions or concerns.  A total of more than 55 minutes were spent on this encounter with face-to-face time and non-face-to-face time, including preparing to see the patient, ordering tests and/or medications, counseling the patient and coordination of care as outlined above.   Ledell Peoples, MD Department of Hematology/Oncology [facility] Phone: [phone] Pager: [phone] Email: [email or web address]  11/08/2020 1:06 PM

## 2020-11-09 LAB — TYPE AND SCREEN
ABO/RH(D): A POS
Antibody Screen: NEGATIVE
Unit division: 0

## 2020-11-09 LAB — CBC
HCT: 23.5 % — ABNORMAL LOW (ref 36.0–46.0)
Hemoglobin: 7.4 g/dL — ABNORMAL LOW (ref 12.0–15.0)
MCH: 24.2 pg — ABNORMAL LOW (ref 26.0–34.0)
MCHC: 31.5 g/dL (ref 30.0–36.0)
MCV: 76.8 fL — ABNORMAL LOW (ref 80.0–100.0)
Platelets: 178 10*3/uL (ref 150–400)
RBC: 3.06 MIL/uL — ABNORMAL LOW (ref 3.87–5.11)
WBC: 3.5 10*3/uL — ABNORMAL LOW (ref 4.0–10.5)
nRBC: 0 % (ref 0.0–0.2)

## 2020-11-09 LAB — BASIC METABOLIC PANEL
Anion gap: 6 (ref 5–15)
BUN: 17 mg/dL (ref 6–20)
CO2: 19 mmol/L — ABNORMAL LOW (ref 22–32)
Calcium: 8.3 mg/dL — ABNORMAL LOW (ref 8.9–10.3)
Chloride: 111 mmol/L (ref 98–111)
Creatinine, Ser: 2.14 mg/dL — ABNORMAL HIGH (ref 0.44–1.00)
GFR, Estimated: 29 mL/min — ABNORMAL LOW (ref >60)
Glucose, Bld: 80 mg/dL (ref 70–99)
Potassium: 4.5 mmol/L (ref 3.5–5.1)
Sodium: 136 mmol/L (ref 135–145)

## 2020-11-09 LAB — CMV DNA, QUANTITATIVE, PCR
CMV DNA Quant: POSITIVE IU/mL
Log10 CMV Qn DNA Pl: UNDETERMINED log10 IU/mL

## 2020-11-09 LAB — BPAM RBC
Blood Product Expiration Date: 202206022359
ISSUE DATE / TIME: 202205281317
Unit Type and Rh: 6200

## 2020-11-09 NOTE — Progress Notes (Signed)
[facility] for Infectious Disease    Date of Admission:  11/05/2020     ID: Candace Jones is a 40 y.o. 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 Having lunch with her friends  Medications:  . clindamycin  600 mg Oral Q6H  . docusate sodium  100 mg Oral BID  . dolutegravir  50 mg Oral Daily  . ferrous sulfate  325 mg Oral Q breakfast  . lamiVUDine  300 mg Oral Daily  . leucovorin  25 mg Oral Daily  . levETIRAcetam  750 mg Oral BID  . pantoprazole  40 mg Oral QHS  . pyrimethamine  75 mg Oral Q breakfast  . sodium chloride flush  5 mL Intracatheter Q8H    Objective: Vital signs in last 24 hours: Temp:  [97.7 F (36.5 C)-98.7 F (37.1 C)] 98.7 F (37.1 C) (05/29 0433) Pulse Rate:  [72] 72 (05/28 1726) Resp:  [18] 18 (05/29 0433) BP: (124-139)/(80-94) 124/80 (05/29 0433) SpO2:  [95 %-100 %] 95 % (05/29 0433) Physical Exam  Constitutional:  oriented to person, place, and time. appears well-developed and well-nourished. No distress.  HENT: [place]/AT, PERRLA, no scleral icterus Mouth/Throat: Oropharynx is clear and moist. No oropharyngeal exudate.  Cardiovascular: Normal rate, regular rhythm and normal heart sounds. Exam reveals no gallop and no friction rub.  No murmur heard.  Pulmonary/Chest: Effort normal and breath sounds normal. No respiratory distress.  has no wheezes.  Neck = supple, no nuchal rigidity Abdominal: Soft. Bowel sounds are normal.  exhibits no distension. There is no tenderness.  Gu= drain in  place Lymphadenopathy: no cervical adenopathy. No axillary adenopathy Neurological: alert and oriented to person, place, and time.  Skin: Skin is warm and dry. No rash noted. No erythema.  Psychiatric: a normal mood and affect.  behavior is normal.    Lab Results Recent Labs    11/07/20 0125 11/08/20 0305 11/09/20 0043  WBC 3.7* 3.3* 3.5*  HGB 7.3* 7.3* 7.4*  HCT 23.5* 23.7* 23.5*  NA 136 136  --   K 4.4 4.4  --   CL 108 106  --   CO2 22 20*  --   BUN 26* 18  --   CREATININE 3.42* 2.54*  --     Microbiology: reviewed Studies/Results: MR BRAIN WO CONTRAST  Result Date: 11/08/2020 CLINICAL DATA:  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 EXAM: MRI HEAD WITHOUT CONTRAST TECHNIQUE: Multiplanar, multiecho pulse sequences of the brain and surrounding structures were obtained without intravenous contrast. COMPARISON:  Prior MRI from 10/01/2019 FINDINGS: Brain: Previously seen 2 ring-enhancing lesions involving the left frontal operculum and left temporal lobe are markedly improved as compared to previous exam, consistent with interval response to therapy. A small area of mild residual T2/FLAIR signal abnormality measuring up to 2.6 cm now seen at the level of the left frontal region (series 11, image 15). Small amount of associated T1 hyperintensity and susceptibility artifact, which could reflect blood products. Additional fairly mild residual FLAIR signal abnormality seen  at the left temporal lesion as well (series 11, image 8). This lesion was the site of biopsy, with postsurgical changes seen within the overlying scalp and calvarium. 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 Previously seen vasogenic edema and mass effect is markedly improved, with no significant regional mass effect now seen. No midline shift. No other new lesions or other signal  abnormality identified. No evidence for acute or subacute infarct. No other evidence for acute or chronic intracranial hemorrhage. 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 No other mass lesion or mass effect. No extra-axial fluid collection. Pituitary gland suprasellar region normal. Midline structures intact. Vascular: Major intracranial vascular flow voids are maintained. Skull and upper cervical spine: Craniocervical junction normal. Diffusely decreased T1 signal intensity throughout the visualized bone marrow, likely related to anemia. Postoperative changes present at the left temporal region. Sinuses/Orbits: Globes and orbital soft tissues demonstrate no acute finding. Chronic frontoethmoidal and maxillary sinusitis noted, similar to previous. No significant mastoid effusion. Inner ear structures grossly normal. Other: None. IMPRESSION: 1. Marked interval improvement in appearance of the previously identified two ring-enhancing lesions involving the left cerebral hemisphere, consistent with interval response to therapy. Residual mild localized signal change and edema at these locations without significant regional mass effect. 2. Sequelae of prior brain biopsy at the left temporal lesion, with associated small dural defect and meningoencephalocele. 3. No other new intracranial lesions or other abnormality identified. 4. Chronic frontoethmoidal and maxillary sinusitis, similar to previous. Electronically Signed   By: Jeannine Boga M.D.   On: 11/08/2020 04:14     Assessment/Plan: hiv disease =continue on DTG-lamivudine  CNS toxo = continue on clindamycin, pyrimethamine plus leucovorin  aki = please check cr tomorrow to decide if sufficient to start pjp proph  Anemia = parvob19 pending, LDH, haptoglobin and homocysteine is pending. Likely multifactorial cause.  Perirectal abscess = drain in place  [facility] Cell: [phone] Pager: [phone]  11/09/2020, 2:09 PM

## 2020-11-09 NOTE — Plan of Care (Signed)
  Problem: Health Behavior/Discharge Planning: Goal: Ability to manage health-related needs will improve Outcome: Progressing   Problem: Education: Goal: Knowledge of General Education information will improve Description: Including pain rating scale, medication(s)/side effects and non-pharmacologic comfort measures Outcome: Progressing   Problem: Nutrition: Goal: Adequate nutrition will be maintained Outcome: Progressing   Problem: Activity: Goal: Risk for activity intolerance will decrease Outcome: Progressing

## 2020-11-09 NOTE — Progress Notes (Signed)
PROGRESS NOTE    Candace Jones  JGG:[id number] DOB: Jul 10, 1980 DOA: 11/05/2020 PCP: Patient, No Pcp Per (Inactive)   Brief Narrative: Candace Jones is a 40 y.o. female history of AIDS, toxoplasma meningoencephalitis, recent perirectal abscess. 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 She has been taking ibuprofen to help with headaches. CT imaging with evidence of ureteral mass every and resultant mild-moderate bilateral hydronephrosis; discussed with urology who states this chronic issues likely is not accounting for AKI. Complicated by patient's AIDS status. Improvement with IV fluids. -Nephrology recommendation: Signed off. -Daily BMP (pending today  Gross hematuria Seems this may have been vaginal bleeding rather than hematuria. Hematuria has resolved with resolution of vaginal bleeding. Discussed with urology who recommended outpatient urology resident clinic visit.  Acute blood loss anemia Acute on chronic anemia Patient with chronic anemia.  Previous iron panel from April 2022 significant for iron deficiency anemia.  Patient also with recent gross hematuria which likely contributed to acute component.  Secondary to uterine fibroids and resultant heavy menstrual bleeding. Baseline hemoglobin of about 8-9. Hemoglobin down to 7.3 and stable.  Reticulocyte count is low. LDH minimally elevated at 204. -Serial CBC, transfuse as needed for hemoglobin less  than 7 -Oncology recommendations: MMA, homocysteine, parvovirus B19 serology/PCR, haptoglobin  Perirectal abscess Previously treated with percutaneous drain placement and antibiotics in April 2022.  Complicated by AIDS diagnosis. -General surgery recommendations: recommendation for drain injection prior to removal -IR recommendations: follow-up at drain clinic 10-14 days s/p discharge for CT/drain injection  AIDS Undetectable CD4 count.  Viral load of 57,200 copies per milliliter.  Patient is on [place] as an outpatient which has been held secondary to severe renal impairment. -Infectious disease recommendations: dolutegravir and lamivudine  Uterine fibroids Previously diagnosed as fibroids vs leiomyosarcoma. Gynecology consulted at previous hospitalization who recommended outpatient follow-up and per assessment, unlikely leiomyosarcoma.  Hyperkalemia Mildly elevated secondary to AKI. Given one dose of Lokelma.  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 Started on sulfadiazine and pyrimethamine which were continued on admission -Infectious disease recommendations: added clindamycin and discontinued sulfadiazine   DVT prophylaxis: SCDs Code Status:   Code Status: Full Code Family Communication: None at bedside Disposition Plan: Discharge home likely in 2-4 days pending continued management/improvement of AKI, management of abscess, ID recommendations   Consultants:   Infectious disease  Urology  General surgery  Medical oncology  Interventional radiology  Procedures:   RIGHT PELVIC DRAIN PLACEMENT (11/07/2020)  Antimicrobials:  Sulfadiazine  Lamivudine  Dolutegravir  Clindamycin   Subjective: Phone interpreter  No issues overnight or concerns this morning.  Objective: Vitals:   11/08/20 1349 11/08/20 1726 11/08/20 2045 11/09/20 0433  BP: (!) 121/95 124/90 (!) 139/94 124/80  Pulse: 78 72    Resp: 18 18 18 18   Temp: 97.6 F (36.4 C) 98 F  (36.7 C) 97.7 F (36.5 C) 98.7 F (37.1 C)  TempSrc: Oral Oral Oral Oral  SpO2: 100% 100% 98% 95%    Intake/Output Summary (  Last 24 hours) at 11/09/2020 1353 Last data filed at 11/09/2020 0606 Gross per 24 hour  Intake 1649.43 ml  Output 1615 ml  Net 34.43 ml   There were no vitals filed for this visit.  Examination:  General exam: Appears calm and comfortable Respiratory system: Clear to auscultation. Respiratory effort normal. Cardiovascular system: S1 & S2 heard, RRR. No murmurs, rubs, gallops or clicks. Gastrointestinal system: Abdomen is nondistended, soft and nontender. No organomegaly or masses felt. Normal bowel sounds heard. Central nervous system: Alert and oriented. No focal neurological deficits. Musculoskeletal: No edema. No calf tenderness Skin: No cyanosis. No rashes Psychiatry: Judgement and insight appear normal. Mood & affect appropriate.     Data Reviewed: I have personally reviewed following labs and imaging studies  CBC Lab Results  Component Value Date   WBC 3.5 (L) 11/09/2020   RBC 3.06 (L) 11/09/2020   HGB 7.4 (L) 11/09/2020   HCT 23.5 (L) 11/09/2020   MCV 76.8 (L) 11/09/2020   MCH 24.2 (L) 11/09/2020   PLT 178 11/09/2020   MCHC 31.5 11/09/2020   RDW Not Measured 11/09/2020   LYMPHSABS 0.8 11/05/2020   MONOABS 0.4 11/05/2020   EOSABS 0.1 11/05/2020   BASOSABS 0.0 18/56/3149     Last metabolic panel Lab Results  Component Value Date   NA 136 11/08/2020   K 4.4 11/08/2020   CL 106 11/08/2020   CO2 20 (L) 11/08/2020   BUN 18 11/08/2020   CREATININE 2.54 (H) 11/08/2020   GLUCOSE 84 11/08/2020   GFRNONAA 24 (L) 11/08/2020   CALCIUM 8.5 (L) 11/08/2020   PHOS 3.6 10/01/2020   PROT 7.5 11/05/2020   ALBUMIN 3.4 (L) 11/05/2020   BILITOT 0.2 (L) 11/05/2020   ALKPHOS 64 11/05/2020   AST 50 (H) 11/05/2020   ALT 54 (H) 11/05/2020   ANIONGAP 10 11/08/2020    CBG (last 3)  No results for input(s): GLUCAP in the last 72 hours.    GFR: CrCl cannot be calculated (Unknown ideal weight.).  Coagulation Profile: Recent Labs  Lab 11/07/20 0125  INR 1.1    Recent Results (from the past 240 hour(s))  Urine culture     Status: Abnormal   Collection Time: 11/05/20 11:03 PM   Specimen: Urine, Random  Result Value Ref Range Status   Specimen Description URINE, RANDOM  Final   Special Requests NONE  Final   Culture (A)  Final    <10,000 COLONIES/mL INSIGNIFICANT GROWTH Performed at [facility], [address] [place], [place] [address]    Report Status 11/07/2020 FINAL  Final  SARS CORONAVIRUS 2 (TAT 6-24 HRS) Nasopharyngeal Nasopharyngeal Swab     Status: None   Collection Time: 11/06/20 12:14 AM   Specimen: Nasopharyngeal Swab  Result Value Ref Range Status   SARS Coronavirus 2 NEGATIVE NEGATIVE Final    Comment: (NOTE) SARS-CoV-2 target nucleic acids are NOT DETECTED.  The SARS-CoV-2 RNA is generally detectable in upper and lower respiratory specimens during the acute phase of infection. Negative results do not preclude SARS-CoV-2 infection, do not rule out co-infections with other pathogens, and should not be used as the sole basis for treatment or other patient management decisions. Negative results must be combined with clinical observations, patient history, and epidemiological information. The expected result is Negative.  Fact Sheet for Patients: SugarRoll.be  Fact Sheet for Healthcare Providers: https://www.woods-mathews.com/  This test is not yet approved or cleared by the [place] FDA and  has been authorized for detection  and/or diagnosis of SARS-CoV-2 by FDA under an Emergency Use Authorization (EUA). This EUA will remain  in effect (meaning this test can be used) for the duration of the COVID-19 declaration under Se ction 564(b)(1) of the Act, 21 U.S.C. section 360bbb-3(b)(1), unless the authorization is terminated or revoked  sooner.  Performed at [facility], [place] [address], [place], [place] [address]   Aerobic/Anaerobic Culture w Gram Stain (surgical/deep wound)     Status: None (Preliminary result)   Collection Time: 11/07/20 10:10 AM   Specimen: Abscess  Result Value Ref Range Status   Specimen Description ABSCESS  Final   Special Requests PELVIC RIGHT  Final   Gram Stain   Final    ABUNDANT WBC PRESENT, PREDOMINANTLY PMN NO ORGANISMS SEEN    Culture   Final    NO GROWTH < 24 HOURS Performed at [facility], [place] [address] [place], [place] [address]    Report Status PENDING  Incomplete  Acid Fast Smear (AFB)     Status: None   Collection Time: 11/07/20 10:10 AM   Specimen: Perirectal; Abscess  Result Value Ref Range Status   AFB Specimen Processing Concentration  Final   Acid Fast Smear Negative  Final    Comment: (NOTE) Performed At: [facility] [place], [place] [id number] Rush Farmer MD KN:[id number]    Source (AFB) PELVIC RIGHT  Final    Comment: Performed at [facility], [place] [address] [place], [place] [address]        Radiology Studies: MR BRAIN WO CONTRAST  Result Date: 11/08/2020 CLINICAL DATA:  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 EXAM: MRI HEAD WITHOUT CONTRAST TECHNIQUE: Multiplanar, multiecho pulse sequences of the brain and surrounding structures were obtained without intravenous contrast. COMPARISON:  Prior MRI from 10/01/2019 FINDINGS: Brain: Previously seen 2 ring-enhancing lesions involving the left frontal operculum and left temporal lobe are markedly improved as compared to previous exam, consistent with interval response to therapy. A small area of mild residual T2/FLAIR signal abnormality measuring up to 2.6 cm now seen at the level of the left frontal region (series 11, image 15). Small amount of associated T1 hyperintensity and susceptibility artifact,  which could reflect blood products. Additional fairly mild residual FLAIR signal abnormality seen at the left temporal lesion as well (series 11, image 8). This lesion was the site of biopsy, with postsurgical changes seen within the overlying scalp and calvarium. 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 Previously seen vasogenic edema and mass effect is markedly improved, with no significant regional mass effect now seen. No midline shift. No other new lesions or other signal abnormality identified. No evidence for acute or subacute infarct. No other evidence for acute or chronic intracranial hemorrhage. 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 No other mass lesion or mass effect. No extra-axial fluid collection. Pituitary gland suprasellar region normal. Midline structures intact. Vascular: Major intracranial vascular flow voids are maintained. Skull and upper cervical spine: Craniocervical junction normal. Diffusely decreased T1 signal intensity throughout the visualized bone marrow, likely related to anemia. Postoperative changes present at the left temporal region. Sinuses/Orbits: Globes and orbital soft tissues demonstrate no acute finding. Chronic frontoethmoidal and maxillary sinusitis noted, similar to previous. No significant mastoid effusion. Inner ear structures grossly normal. Other: None.  IMPRESSION: 1. Marked interval improvement in appearance of the previously identified two ring-enhancing lesions involving the left cerebral hemisphere, consistent with interval response to therapy. Residual mild localized signal change and edema at these locations without significant regional mass effect. 2. Sequelae of prior brain biopsy at the left temporal lesion, with associated small dural defect and meningoencephalocele. 3. No other new intracranial  lesions or other abnormality identified. 4. Chronic frontoethmoidal and maxillary sinusitis, similar to previous. Electronically Signed   By: Jeannine Boga M.D.   On: 11/08/2020 04:14        Scheduled Meds: . clindamycin  600 mg Oral Q6H  . docusate sodium  100 mg Oral BID  . dolutegravir  50 mg Oral Daily  . ferrous sulfate  325 mg Oral Q breakfast  . lamiVUDine  300 mg Oral Daily  . leucovorin  25 mg Oral Daily  . levETIRAcetam  750 mg Oral BID  . pantoprazole  40 mg Oral QHS  . pyrimethamine  75 mg Oral Q breakfast  . sodium chloride flush  5 mL Intracatheter Q8H   Continuous Infusions:    LOS: 3 days     Cordelia Poche, MD [facility] 11/09/2020, 1:53 PM  If 7PM-7AM, please contact night-coverage [email or web address]

## 2020-11-09 NOTE — Progress Notes (Signed)
IV site painful to flush, patient requests it removed.  IV removed, catheter intact, pressure dressing applied.  Patient tolerated well.  Patient is refusing to allow an IV to be place tonight, says she will tomorrow.  Dr Hal Hope notified, patient has PO meds, nothing IV.  Ok to leave IV out for tonight.

## 2020-11-10 LAB — CBC
HCT: 25.3 % — ABNORMAL LOW (ref 36.0–46.0)
Hemoglobin: 7.9 g/dL — ABNORMAL LOW (ref 12.0–15.0)
MCH: 24.5 pg — ABNORMAL LOW (ref 26.0–34.0)
MCHC: 31.2 g/dL (ref 30.0–36.0)
MCV: 78.3 fL — ABNORMAL LOW (ref 80.0–100.0)
Platelets: 189 10*3/uL (ref 150–400)
RBC: 3.23 MIL/uL — ABNORMAL LOW (ref 3.87–5.11)
WBC: 3.7 10*3/uL — ABNORMAL LOW (ref 4.0–10.5)
nRBC: 0 % (ref 0.0–0.2)

## 2020-11-10 LAB — BASIC METABOLIC PANEL
Anion gap: 8 (ref 5–15)
BUN: 11 mg/dL (ref 6–20)
CO2: 18 mmol/L — ABNORMAL LOW (ref 22–32)
Calcium: 8.4 mg/dL — ABNORMAL LOW (ref 8.9–10.3)
Chloride: 110 mmol/L (ref 98–111)
Creatinine, Ser: 1.86 mg/dL — ABNORMAL HIGH (ref 0.44–1.00)
GFR, Estimated: 35 mL/min — ABNORMAL LOW (ref >60)
Glucose, Bld: 116 mg/dL — ABNORMAL HIGH (ref 70–99)
Potassium: 4 mmol/L (ref 3.5–5.1)
Sodium: 136 mmol/L (ref 135–145)

## 2020-11-10 MED ORDER — DAPSONE 100 MG PO TABS
100.0000 mg | ORAL_TABLET | Freq: Every day | ORAL | Status: DC
  Administered 2020-11-10 – 2020-11-15 (×6): 100 mg via ORAL
  Filled 2020-11-10 (×6): qty 1

## 2020-11-10 MED ORDER — AZITHROMYCIN 600 MG PO TABS
1200.0000 mg | ORAL_TABLET | ORAL | Status: DC
  Administered 2020-11-10: 1200 mg via ORAL
  Filled 2020-11-10: qty 2

## 2020-11-10 NOTE — Progress Notes (Signed)
PROGRESS NOTE    Candace Jones  PPI:[id number] DOB: April 27, 1981 DOA: 11/05/2020 PCP: Patient, No Pcp Per (Inactive)   Brief Narrative: Candace Jones is a 40 y.o. female history of AIDS, toxoplasma meningoencephalitis, recent perirectal abscess. 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 She has been taking ibuprofen to help with headaches. CT imaging with evidence of ureteral mass every and resultant mild-moderate bilateral hydronephrosis; discussed with urology who states this chronic issues likely is not accounting for AKI. Complicated by patient's AIDS status. Improvement with IV fluids; IV fluids discontinued.. -Nephrology recommendation: Signed off. -Daily BMP  Gross hematuria Seems this may have been vaginal bleeding rather than hematuria. Hematuria has resolved with resolution of vaginal bleeding. Discussed with urology who recommended outpatient urology resident clinic visit.  Acute blood loss anemia Acute on chronic anemia Patient with chronic anemia.  Previous iron panel from April 2022 significant for iron deficiency anemia.  Patient also with recent gross hematuria which likely contributed to acute component.  Secondary to uterine fibroids and resultant heavy menstrual bleeding. Baseline hemoglobin of about 8-9. Hemoglobin down to 7.3 and stable.  Reticulocyte count is low. LDH minimally elevated at 204. -Serial CBC, transfuse as needed for  hemoglobin less than 7 -Oncology recommendations: MMA, erythropoietin, homocysteine, parvovirus B19 serology/PCR, haptoglobin pending  Perirectal abscess Previously treated with percutaneous drain placement and antibiotics in April 2022.  Complicated by AIDS diagnosis. -General surgery recommendations: recommendation for drain injection prior to removal -IR recommendations: follow-up at drain clinic 10-14 days s/p discharge for CT/drain injection  AIDS Undetectable CD4 count.  Viral load of 57,200 copies per milliliter.  Patient is on [place] as an outpatient which has been held secondary to severe renal impairment. -Infectious disease recommendations: dolutegravir and lamivudine  Uterine fibroids Previously diagnosed as fibroids vs leiomyosarcoma. Gynecology consulted at previous hospitalization who recommended outpatient follow-up and per assessment, unlikely leiomyosarcoma. Gynecology called for set up of outpatient follow-up. Attending, Dr. Elly Modena will send a message for office visit in 2 weeks; phone number [phone] [facility].   Hyperkalemia Mildly elevated secondary to AKI. Given one dose of Lokelma.  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 Started on sulfadiazine and pyrimethamine which were continued on admission -Infectious disease recommendations: added clindamycin and discontinued sulfadiazine   DVT prophylaxis: SCDs Code Status:   Code Status: Full Code Family Communication: None at bedside Disposition Plan: Discharge home likely in 1-4 days pending continued management/improvement of AKI, management of abscess, ID recommendations and hematology recommendations   Consultants:   Infectious disease  Urology  General surgery  Medical oncology  Interventional radiology  Procedures:   RIGHT PELVIC DRAIN PLACEMENT (11/07/2020)  Antimicrobials:  Sulfadiazine  Lamivudine  Dolutegravir  Clindamycin   Subjective: Phone  interpreter  No concerns overnight.  Objective: Vitals:   11/09/20 1523 11/09/20 2048 11/09/20 2100 11/10/20 0539  BP: (!) 132/92 (!) 142/88  104/84  Pulse: 74 75  74  Resp: 18 18  17   Temp: 98 F (  36.7 C) 98.3 F (36.8 C)  97.9 F (36.6 C)  TempSrc: Axillary Oral  Oral  SpO2: 98% 100%  100%  Weight:   74.3 kg   Height:   5\' 8"  (1.727 m)     Intake/Output Summary (Last 24 hours) at 11/10/2020 0751 Last data filed at 11/10/2020 0600 Gross per 24 hour  Intake 1549.32 ml  Output 2350 ml  Net -800.68 ml   Filed Weights   11/09/20 2100  Weight: 74.3 kg    Examination:  General exam: Appears calm and comfortable  Respiratory system: Clear to auscultation. Respiratory effort normal. Cardiovascular system: S1 & S2 heard, RRR. 2/6 systolic murmur Gastrointestinal system: Abdomen is nondistended, soft and nontender. No organomegaly or masses felt. Normal bowel sounds heard. Central nervous system: Alert and oriented. No focal neurological deficits. Musculoskeletal: No edema. No calf tenderness Skin: No cyanosis. No rashes Psychiatry: Judgement and insight appear normal. Mood & affect appropriate.     Data Reviewed: I have personally reviewed following labs and imaging studies  CBC Lab Results  Component Value Date   WBC 3.7 (L) 11/10/2020   RBC 3.23 (L) 11/10/2020   HGB 7.9 (L) 11/10/2020   HCT 25.3 (L) 11/10/2020   MCV 78.3 (L) 11/10/2020   MCH 24.5 (L) 11/10/2020   PLT 189 11/10/2020   MCHC 31.2 11/10/2020   RDW Not Measured 11/10/2020   LYMPHSABS 0.8 11/05/2020   MONOABS 0.4 11/05/2020   EOSABS 0.1 11/05/2020   BASOSABS 0.0 31/51/7616     Last metabolic panel Lab Results  Component Value Date   NA 136 11/10/2020   K 4.0 11/10/2020   CL 110 11/10/2020   CO2 18 (L) 11/10/2020   BUN 11 11/10/2020   CREATININE 1.86 (H) 11/10/2020   GLUCOSE 116 (H) 11/10/2020   GFRNONAA 35 (L) 11/10/2020   CALCIUM 8.4 (L) 11/10/2020   PHOS 3.6 10/01/2020   PROT 7.5  11/05/2020   ALBUMIN 3.4 (L) 11/05/2020   BILITOT 0.2 (L) 11/05/2020   ALKPHOS 64 11/05/2020   AST 50 (H) 11/05/2020   ALT 54 (H) 11/05/2020   ANIONGAP 8 11/10/2020    CBG (last 3)  No results for input(s): GLUCAP in the last 72 hours.   GFR: Estimated Creatinine Clearance: 40.6 mL/min (A) (by C-G formula based on SCr of 1.86 mg/dL (H)).  Coagulation Profile: Recent Labs  Lab 11/07/20 0125  INR 1.1    Recent Results (from the past 240 hour(s))  Urine culture     Status: Abnormal   Collection Time: 11/05/20 11:03 PM   Specimen: Urine, Random  Result Value Ref Range Status   Specimen Description URINE, RANDOM  Final   Special Requests NONE  Final   Culture (A)  Final    <10,000 COLONIES/mL INSIGNIFICANT GROWTH Performed at [facility], [address] [place], [place] [address]    Report Status 11/07/2020 FINAL  Final  SARS CORONAVIRUS 2 (TAT 6-24 HRS) Nasopharyngeal Nasopharyngeal Swab     Status: None   Collection Time: 11/06/20 12:14 AM   Specimen: Nasopharyngeal Swab  Result Value Ref Range Status   SARS Coronavirus 2 NEGATIVE NEGATIVE Final    Comment: (NOTE) SARS-CoV-2 target nucleic acids are NOT DETECTED.  The SARS-CoV-2 RNA is generally detectable in upper and lower respiratory specimens during the acute phase of infection. Negative results do not preclude SARS-CoV-2 infection, do not rule out co-infections with other pathogens, and should not be used as the sole basis for treatment or  other patient management decisions. Negative results must be combined with clinical observations, patient history, and epidemiological information. The expected result is Negative.  Fact Sheet for Patients: SugarRoll.be  Fact Sheet for Healthcare Providers: https://www.woods-mathews.com/  This test is not yet approved or cleared by the [place] FDA and  has been authorized for detection and/or diagnosis of SARS-CoV-2  by FDA under an Emergency Use Authorization (EUA). This EUA will remain  in effect (meaning this test can be used) for the duration of the COVID-19 declaration under Se ction 564(b)(1) of the Act, 21 U.S.C. section 360bbb-3(b)(1), unless the authorization is terminated or revoked sooner.  Performed at [facility], [place] [address], [place], [place] [address]   Aerobic/Anaerobic Culture w Gram Stain (surgical/deep wound)     Status: None (Preliminary result)   Collection Time: 11/07/20 10:10 AM   Specimen: Abscess  Result Value Ref Range Status   Specimen Description ABSCESS  Final   Special Requests PELVIC RIGHT  Final   Gram Stain   Final    ABUNDANT WBC PRESENT, PREDOMINANTLY PMN NO ORGANISMS SEEN    Culture   Final    NO GROWTH 2 DAYS NO ANAEROBES ISOLATED; CULTURE IN PROGRESS FOR 5 DAYS Performed at [facility], [place] [address] [place], [place] [address]    Report Status PENDING  Incomplete  Acid Fast Smear (AFB)     Status: None   Collection Time: 11/07/20 10:10 AM   Specimen: Perirectal; Abscess  Result Value Ref Range Status   AFB Specimen Processing Concentration  Final   Acid Fast Smear Negative  Final    Comment: (NOTE) Performed At: [facility] [place], [place] [id number] Rush Farmer MD TD:[id number]    Source (AFB) PELVIC RIGHT  Final    Comment: Performed at [facility], [place] [address] [place], [place] [address]        Radiology Studies: No results found.      Scheduled Meds: . clindamycin  600 mg Oral Q6H  . docusate sodium  100 mg Oral BID  . dolutegravir  50 mg Oral Daily  . ferrous sulfate  325 mg Oral Q breakfast  . lamiVUDine  300 mg Oral Daily  . leucovorin  25 mg Oral Daily  . levETIRAcetam  750 mg Oral BID  . pantoprazole  40 mg Oral QHS  . pyrimethamine  75 mg Oral Q breakfast  . sodium chloride flush  5 mL Intracatheter Q8H   Continuous Infusions:    LOS: 4 days     Cordelia Poche, MD [facility] 11/10/2020, 7:51 AM  If 7PM-7AM, please contact night-coverage [email or web address]

## 2020-11-11 ENCOUNTER — Telehealth: Payer: Self-pay

## 2020-11-11 ENCOUNTER — Other Ambulatory Visit: Payer: Self-pay | Admitting: Pharmacist

## 2020-11-11 DIAGNOSIS — B582 Toxoplasma meningoencephalitis: Secondary | ICD-10-CM

## 2020-11-11 LAB — ERYTHROPOIETIN: Erythropoietin: 21.8 m[IU]/mL — ABNORMAL HIGH (ref 2.6–18.5)

## 2020-11-11 LAB — CBC
HCT: 25.7 % — ABNORMAL LOW (ref 36.0–46.0)
Hemoglobin: 8.1 g/dL — ABNORMAL LOW (ref 12.0–15.0)
MCH: 24.5 pg — ABNORMAL LOW (ref 26.0–34.0)
MCHC: 31.5 g/dL (ref 30.0–36.0)
MCV: 77.9 fL — ABNORMAL LOW (ref 80.0–100.0)
Platelets: 211 10*3/uL (ref 150–400)
RBC: 3.3 MIL/uL — ABNORMAL LOW (ref 3.87–5.11)
WBC: 3.6 10*3/uL — ABNORMAL LOW (ref 4.0–10.5)
nRBC: 0 % (ref 0.0–0.2)

## 2020-11-11 LAB — BASIC METABOLIC PANEL
Anion gap: 7 (ref 5–15)
BUN: 11 mg/dL (ref 6–20)
CO2: 23 mmol/L (ref 22–32)
Calcium: 8.3 mg/dL — ABNORMAL LOW (ref 8.9–10.3)
Chloride: 105 mmol/L (ref 98–111)
Creatinine, Ser: 1.73 mg/dL — ABNORMAL HIGH (ref 0.44–1.00)
GFR, Estimated: 38 mL/min — ABNORMAL LOW (ref >60)
Glucose, Bld: 91 mg/dL (ref 70–99)
Potassium: 4.1 mmol/L (ref 3.5–5.1)
Sodium: 135 mmol/L (ref 135–145)

## 2020-11-11 LAB — PARVOVIRUS B19 ANTIBODY, IGG AND IGM
Parovirus B19 IgG Abs: 1.7 index — ABNORMAL HIGH (ref 0.0–0.8)
Parovirus B19 IgM Abs: 0.2 index (ref 0.0–0.8)

## 2020-11-11 LAB — HAPTOGLOBIN: Haptoglobin: 192 mg/dL (ref 33–278)

## 2020-11-11 LAB — HOMOCYSTEINE: Homocysteine: 11.9 umol/L (ref 0.0–14.5)

## 2020-11-11 MED ORDER — CLINDAMYCIN HCL 300 MG PO CAPS: 600.0000 mg | ORAL_CAPSULE | Freq: Four times a day (QID) | ORAL | 0 refills | Status: DC

## 2020-11-11 NOTE — Telephone Encounter (Signed)
Received voicemail call from [facility] regarding patient's outpatient medication. Pharmacy needs to confirm if okay to change sulfadiazine to clindamycin. Transferred and Routing call to [place] for assistance.  Eugenia Mcalpine

## 2020-11-11 NOTE — Progress Notes (Signed)
Subjective: No new complaints today.    ROS: See above, otherwise other systems negative  Objective: Vital signs in last 24 hours: Temp:  [97.8 F (36.6 C)-98.9 F (37.2 C)] 98 F (36.7 C) (05/31 0521) Pulse Rate:  [74-80] 80 (05/31 0521) Resp:  [17-18] 18 (05/31 0521) BP: (115-136)/(67-91) 115/67 (05/31 0521) SpO2:  [98 %-100 %] 98 % (05/31 0521) Last BM Date: 11/10/20  Intake/Output from previous day: 05/30 0701 - 05/31 0700 In: 720 [P.O.:720] Out: -  Intake/Output this shift: No intake/output data recorded.  PE: Abd: soft, NT, drain in place with serosang output.   Lab Results:  Recent Labs    11/10/20 0056 11/11/20 0032  WBC 3.7* 3.6*  HGB 7.9* 8.1*  HCT 25.3* 25.7*  PLT 189 211   BMET Recent Labs    11/10/20 0056 11/11/20 0032  NA 136 135  K 4.0 4.1  CL 110 105  CO2 18* 23  GLUCOSE 116* 91  BUN 11 11  CREATININE 1.86* 1.73*  CALCIUM 8.4* 8.3*   PT/INR No results for input(s): LABPROT, INR in the last 72 hours. CMP     Component Value Date/Time   NA 135 11/11/2020 0032   K 4.1 11/11/2020 0032   CL 105 11/11/2020 0032   CO2 23 11/11/2020 0032   GLUCOSE 91 11/11/2020 0032   BUN 11 11/11/2020 0032   CREATININE 1.73 (H) 11/11/2020 0032   CREATININE 5.87 (H) 10/31/2020 1126   CALCIUM 8.3 (L) 11/11/2020 0032   PROT 7.5 11/05/2020 1725   ALBUMIN 3.4 (L) 11/05/2020 1725   AST 50 (H) 11/05/2020 1725   ALT 54 (H) 11/05/2020 1725   ALKPHOS 64 11/05/2020 1725   BILITOT 0.2 (L) 11/05/2020 1725   GFRNONAA 38 (L) 11/11/2020 0032   Lipase     Component Value Date/Time   LIPASE 34 11/05/2020 1725       Studies/Results: No results found.  Anti-infectives: Anti-infectives (From admission, onward)   Start     Dose/Rate Route Frequency Ordered Stop   11/10/20 1000  dapsone tablet 100 mg        100 mg Oral Daily 11/10/20 0912     11/10/20 1000  azithromycin (ZITHROMAX) tablet 1,200 mg  Status:  Discontinued        1,200 mg Oral Weekly  11/10/20 0912 11/11/20 0852   11/07/20 1400  sulfaDIAZINE tablet 1,500 mg  Status:  Discontinued        1,500 mg Oral 4 times daily 11/07/20 1032 11/07/20 1254   11/07/20 1400  clindamycin (CLEOCIN) capsule 600 mg        600 mg Oral Every 6 hours 11/07/20 1254     11/07/20 0800  pyrimethamine (DARAPRIM) tablet 75 mg  Status:  Discontinued        75 mg Oral Daily with breakfast 11/06/20 0043 11/06/20 2011   11/06/20 2200  sulfaDIAZINE tablet 1,500 mg        1,500 mg Oral 4 times daily 11/06/20 1448 11/07/20 1006   11/06/20 2100  pyrimethamine (DARAPRIM) tablet 75 mg        75 mg Oral Daily with breakfast 11/06/20 2011     11/06/20 2000  dolutegravir (TIVICAY) tablet 50 mg        50 mg Oral Daily 11/06/20 1754     11/06/20 2000  lamiVUDine (EPIVIR) tablet 300 mg       Note to Pharmacy: Do NOT adjust dose based on renal fxn  300 mg Oral Daily 11/06/20 1754     11/06/20 1800  sulfaDIAZINE tablet 1,500 mg  Status:  Discontinued        1,500 mg Oral  Once 11/06/20 1321 11/07/20 0947   11/06/20 1030  sulfaDIAZINE tablet 1,500 mg        1,500 mg Oral  Once 11/06/20 1027 11/06/20 1156   11/06/20 1000  sulfaDIAZINE tablet 1,500 mg  Status:  Discontinued        1,500 mg Oral 4 times daily 11/06/20 0043 11/07/20 1032       Assessment/Plan 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  Cultures are negative, but this has recurred.  She will likely need a drain injection by IR with repeat scan to assure there is no connection to the rectum.  Their plans are for a follow up scan etc in 10-14 days.   -we can have her follow up in the office in about 3-4 weeks to make sure she is doing well post drain. -otherwise we will sign off at this time.  Further management of the drain per IR at this time.  FEN - renal diet VTE -  on hold ID - clindamycin   LOS: 5 days    Henreitta Cea , [facility] 11/11/2020, 10:02 AM Please see Amion for pager number during day hours 7:00am-4:30pm or 7:00am -11:30am on weekends

## 2020-11-11 NOTE — Progress Notes (Signed)
PROGRESS NOTE    Candace Jones  PPJ:[id number] DOB: 1980/11/18 DOA: 11/05/2020 PCP: Patient, No Pcp Per (Inactive)   Brief Narrative: Candace Jones is a 40 y.o. female history of AIDS, toxoplasma meningoencephalitis, recent perirectal abscess. 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 She has been taking ibuprofen to help with headaches. CT imaging with evidence of ureteral mass every and resultant mild-moderate bilateral hydronephrosis; discussed with urology who states this chronic issues likely is not accounting for AKI. Complicated by patient's AIDS status. Improvement with IV fluids; IV fluids discontinued.. -Nephrology recommendation: Signed off. -Daily BMP  Gross hematuria Seems this may have been vaginal bleeding rather than hematuria. Hematuria has resolved with resolution of vaginal bleeding. Discussed with urology who recommended outpatient urology resident clinic visit.  Acute blood loss anemia Acute on chronic anemia Patient with chronic anemia.  Previous iron panel from April 2022 significant for iron deficiency anemia.  Patient also with recent gross hematuria which likely contributed to acute component.  Secondary to uterine fibroids and resultant heavy menstrual bleeding. Baseline hemoglobin of about 8-9. Hemoglobin down to 7.3 and stable.  Reticulocyte count is uncharacteristically low, so hematology consulted. LDH minimally elevated at  204. Haptoglobin normal. -Serial CBC, transfuse as needed for hemoglobin less than 7 -Hematology recommendations: MMA, erythropoietin, homocysteine, parvovirus B19 serology/PCR pending  Perirectal abscess Previously treated with percutaneous drain placement and antibiotics in April 2022.  Complicated by AIDS diagnosis. -General surgery recommendations: recommendation for drain injection prior to removal -IR recommendations: follow-up at drain clinic 10-14 days s/p discharge for CT/drain injection  AIDS Newly diagnosed one year prior. Undetectable CD4 count.  Viral load of 57,200 copies per milliliter prior to admission; 100 copies per milliliter on 5/26.  Patient is on [place] as an outpatient which has been held secondary to severe renal impairment. -Infectious disease recommendations: dolutegravir and lamivudine  Uterine fibroids Previously diagnosed as fibroids vs leiomyosarcoma. Gynecology consulted at previous hospitalization who recommended outpatient follow-up and per assessment, unlikely leiomyosarcoma. Gynecology called for set up of outpatient follow-up. Attending, Dr. Elly Modena will send a message for office visit in 2 weeks; phone number [phone] at [facility] [address].  Hyperkalemia Mildly elevated secondary to AKI. Given one dose of Lokelma. Resolved.  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 Started on sulfadiazine and pyrimethamine which were continued on admission -Infectious disease recommendations: added clindamycin and discontinued sulfadiazine   DVT prophylaxis: SCDs Code Status:   Code Status: Full Code Family Communication: None at bedside Disposition Plan: Discharge home likely in 1-4 days pending continued management/improvement of AKI, management of abscess, ID recommendations and hematology recommendations   Consultants:   Infectious disease  Urology  General surgery  Medical oncology  Interventional  radiology  Procedures:   RIGHT PELVIC DRAIN PLACEMENT (11/07/2020)  Antimicrobials:  Sulfadiazine  Lamivudine  Dolutegravir  Clindamycin   Subjective: Video interpreter used  No issues overnight. No concerns this morning.  Objective: Vitals:   11/10/20 0539 11/10/20 1135 11/10/20 2125  11/11/20 0521  BP: 104/84 (!) 136/91 120/81 115/67  Pulse: 74 79 74 80  Resp: 17 17 18 18   Temp: 97.9 F (36.6 C) 98.9 F (37.2 C) 97.8 F (36.6 C) 98 F (36.7 C)  TempSrc: Oral Oral Oral Oral  SpO2: 100% 100% 100% 98%  Weight:      Height:        Intake/Output Summary (Last 24 hours) at 11/11/2020 1308 Last data filed at 11/11/2020 1220 Gross per 24 hour  Intake 240 ml  Output 900 ml  Net -660 ml   Filed Weights   11/09/20 2100  Weight: 74.3 kg    Examination:  General exam: Appears calm and comfortable Respiratory system: Clear to auscultation. Respiratory effort normal. Cardiovascular system: S1 & S2 heard, RRR. No murmurs, rubs, gallops or clicks. Gastrointestinal system: Abdomen is nondistended, soft and nontender. No organomegaly or masses felt. Normal bowel sounds heard. Central nervous system: Alert and oriented. No focal neurological deficits. Musculoskeletal: No edema. No calf tenderness Skin: No cyanosis. No rashes Psychiatry: Judgement and insight appear normal. Mood & affect appropriate.     Data Reviewed: I have personally reviewed following labs and imaging studies  CBC Lab Results  Component Value Date   WBC 3.6 (L) 11/11/2020   RBC 3.30 (L) 11/11/2020   HGB 8.1 (L) 11/11/2020   HCT 25.7 (L) 11/11/2020   MCV 77.9 (L) 11/11/2020   MCH 24.5 (L) 11/11/2020   PLT 211 11/11/2020   MCHC 31.5 11/11/2020   RDW Not Measured 11/11/2020   LYMPHSABS 0.8 11/05/2020   MONOABS 0.4 11/05/2020   EOSABS 0.1 11/05/2020   BASOSABS 0.0 40/01/6760     Last metabolic panel Lab Results  Component Value Date   NA 135 11/11/2020   K 4.1 11/11/2020   CL 105  11/11/2020   CO2 23 11/11/2020   BUN 11 11/11/2020   CREATININE 1.73 (H) 11/11/2020   GLUCOSE 91 11/11/2020   GFRNONAA 38 (L) 11/11/2020   CALCIUM 8.3 (L) 11/11/2020   PHOS 3.6 10/01/2020   PROT 7.5 11/05/2020   ALBUMIN 3.4 (L) 11/05/2020   BILITOT 0.2 (L) 11/05/2020   ALKPHOS 64 11/05/2020   AST 50 (H) 11/05/2020   ALT 54 (H) 11/05/2020   ANIONGAP 7 11/11/2020    CBG (last 3)  No results for input(s): GLUCAP in the last 72 hours.   GFR: Estimated Creatinine Clearance: 43.6 mL/min (A) (by C-G formula based on SCr of 1.73 mg/dL (H)).  Coagulation Profile: Recent Labs  Lab 11/07/20 0125  INR 1.1    Recent Results (from the past 240 hour(s))  Urine culture     Status: Abnormal   Collection Time: 11/05/20 11:03 PM   Specimen: Urine, Random  Result Value Ref Range Status   Specimen Description URINE, RANDOM  Final   Special Requests NONE  Final   Culture (A)  Final    <10,000 COLONIES/mL INSIGNIFICANT GROWTH Performed at [facility], [address], [place], [place]  [address]    Report Status 11/07/2020 FINAL  Final  SARS CORONAVIRUS 2 (TAT 6-24 HRS) Nasopharyngeal Nasopharyngeal Swab     Status: None   Collection Time: 11/06/20 12:14 AM   Specimen: Nasopharyngeal Swab  Result Value Ref Range Status   SARS Coronavirus 2 NEGATIVE NEGATIVE Final    Comment: (NOTE) SARS-CoV-2 target nucleic acids are NOT DETECTED.  The SARS-CoV-2 RNA is generally detectable in upper and lower respiratory specimens during the acute phase of infection. Negative results do  not preclude SARS-CoV-2 infection, do not rule out co-infections with other pathogens, and should not be used as the sole basis for treatment or other patient management decisions. Negative results must be combined with clinical observations, patient history, and epidemiological information. The expected result is Negative.  Fact Sheet for Patients: SugarRoll.be  Fact Sheet for  Healthcare Providers: https://www.woods-mathews.com/  This test is not yet approved or cleared by the [place] FDA and  has been authorized for detection and/or diagnosis of SARS-CoV-2 by FDA under an Emergency Use Authorization (EUA). This EUA will remain  in effect (meaning this test can be used) for the duration of the COVID-19 declaration under Se ction 564(b)(1) of the Act, 21 U.S.C. section 360bbb-3(b)(1), unless the authorization is terminated or revoked sooner.  Performed at [facility], [place] [address] [place], [place] [address]   Aerobic/Anaerobic Culture w Gram Stain (surgical/deep wound)     Status: None (Preliminary result)   Collection Time: 11/07/20 10:10 AM   Specimen: Abscess  Result Value Ref Range Status   Specimen Description ABSCESS  Final   Special Requests PELVIC RIGHT  Final   Gram Stain   Final    ABUNDANT WBC PRESENT, PREDOMINANTLY PMN NO ORGANISMS SEEN    Culture   Final    NO GROWTH 4 DAYS NO ANAEROBES ISOLATED; CULTURE IN PROGRESS FOR 5 DAYS Performed at [facility], [place] [address] [place], [place] [address]    Report Status PENDING  Incomplete  Acid Fast Smear (AFB)     Status: None   Collection Time: 11/07/20 10:10 AM   Specimen: Perirectal; Abscess  Result Value Ref Range Status   AFB Specimen Processing Concentration  Final   Acid Fast Smear Negative  Final    Comment: (NOTE) Performed At: [facility] [place], [place] [id number] Rush Farmer MD CL:[id number]    Source (AFB) PELVIC RIGHT  Final    Comment: Performed at [facility], [place] [address], [place], [place] [address]        Radiology Studies: No results found.      Scheduled Meds: . clindamycin  600 mg Oral Q6H  . dapsone  100 mg Oral Daily  . docusate sodium  100 mg Oral BID  . dolutegravir  50 mg Oral Daily  . ferrous sulfate  325 mg Oral Q breakfast  . lamiVUDine  300 mg Oral Daily  . leucovorin  25 mg  Oral Daily  . levETIRAcetam  750 mg Oral BID  . pantoprazole  40 mg Oral QHS  . pyrimethamine  75 mg Oral Q breakfast  . sodium chloride flush  5 mL Intracatheter Q8H   Continuous Infusions:    LOS: 5 days     Cordelia Poche, MD [facility] 11/11/2020, 1:08 PM  If 7PM-7AM, please contact night-coverage [email or web address]

## 2020-11-11 NOTE — Progress Notes (Signed)
Referring Physician(s): Saverio Danker PA-C   Supervising Physician: Mir, Sharen Heck  Patient Status:  [facility] - In-pt  Chief Complaint:  S/p right transgluteal drain placement with Dr. Serafina Royals on 5/27   Subjective:  Pt laying in bed, not in acute distress.  States no pain around the right buttocks area.  Intermittent nausea.   Allergies: Patient has no known allergies.  Medications: Prior to Admission medications   Medication Sig Start Date End Date Taking? Authorizing Provider  bictegravir-emtricitabine-tenofovir AF (BIKTARVY) 50-200-25 MG TABS tablet Take 1 tablet by mouth daily. 10/17/20  Yes Pierceton Callas, NP  docusate sodium (COLACE) 100 MG capsule Take 1 capsule (100 mg total) by mouth 2 (two) times daily. 10/10/20  Yes Bonnielee Haff, MD  ferrous sulfate 325 (65 FE) MG tablet Take 1 tablet (325 mg total) by mouth daily with breakfast. 10/10/20 01/08/21 Yes [place] Donnamarie Poag, DO  ibuprofen (ADVIL) 200 MG tablet Take 400 mg by mouth every 6 (six) hours as needed for headache or moderate pain.   Yes [provider]  levETIRAcetam (KEPPRA) 750 MG tablet Take 1 tablet (750 mg total) by mouth 2 (two) times daily. 10/31/20 01/29/21 Yes [place] Callas, NP  promethazine (PHENERGAN) 12.5 MG tablet Take 1 tablet (12.5 mg total) by mouth every 6 (six) hours as needed for nausea or vomiting. 10/31/20  Yes Kuppelweiser, Cassie L, RPH-CPP  sulfaDIAZINE 500 MG tablet Take 3 tablets (1,500 mg total) by mouth 4 (four) times daily for 12 days. 11/07/20 11/19/20 Yes  Callas, NP  leucovorin (WELLCOVORIN) 25 MG tablet Take 1 tablet (25 mg total) by mouth daily. 11/06/20   Kuppelweiser, Cassie L, RPH-CPP  omeprazole (PRILOSEC) 20 MG capsule Take 1 capsule (20 mg total) by mouth daily. 11/06/20   Kuppelweiser, Cassie L, RPH-CPP  pyrimethamine (DARAPRIM) 25 MG tablet Take 3 tablets (75 mg total) by mouth daily with breakfast. 11/07/20   Kuppelweiser, Cassie L, RPH-CPP     Vital  Signs: BP 115/67 (BP Location: Left Arm)   Pulse 80   Temp 98 F (36.7 C) (Oral)   Resp 18   Ht 5\' 8"  (1.727 m)   Wt 163 lb 12.8 oz (74.3 kg)   SpO2 98%   BMI 24.91 kg/m   Physical Exam Constitutional:      General: She is not in acute distress.    Appearance: Normal appearance. She is not ill-appearing.  HENT:     Head: Normocephalic and atraumatic.  Cardiovascular:     Rate and Rhythm: Normal rate.  Pulmonary:     Effort: Pulmonary effort is normal.  Abdominal:     General: Abdomen is flat.     Palpations: Abdomen is soft.  Skin:    General: Skin is warm and dry.     Comments: Positive right transgleuteal drain to a suction bulb. Site is unremarkable with no erythema, edema, tenderness, bleeding or drainage. Suture and stat lock in place. Dressing is clean, dry, and intact. 5 ml of serosanguinous  fluid noted in the suction bulb. Drain aspirates and flushes well.   Neurological:     Mental Status: She is alert.     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 EXAM: MRI HEAD WITHOUT CONTRAST TECHNIQUE: Multiplanar, multiecho pulse sequences of the brain and surrounding structures were obtained without intravenous contrast. COMPARISON:  Prior MRI from 10/01/2019 FINDINGS: Brain: Previously  seen 2 ring-enhancing lesions involving the left frontal operculum and left temporal lobe are markedly improved as compared to previous exam, consistent with interval response to therapy. A small area of mild residual T2/FLAIR signal abnormality measuring up to 2.6 cm now seen at the level of the left frontal region (series 11, image 15). Small amount of associated T1 hyperintensity and susceptibility artifact, which could reflect blood products. Additional fairly mild residual FLAIR signal abnormality seen at the left temporal lesion as well (series 11, image  8). This lesion was the site of biopsy, with postsurgical changes seen within the overlying scalp and calvarium. 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 Previously seen vasogenic edema and mass effect is markedly improved, with no significant regional mass effect now seen. No midline shift. No other new lesions or other signal abnormality identified. No evidence for acute or subacute infarct. No other evidence for acute or chronic intracranial hemorrhage. 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 No other mass lesion or mass effect. No extra-axial fluid collection. Pituitary gland suprasellar region normal. Midline structures intact. Vascular: Major intracranial vascular flow voids are maintained. Skull and upper cervical spine: Craniocervical junction normal. Diffusely decreased T1 signal intensity throughout the visualized bone marrow, likely related to anemia. Postoperative changes present at the left temporal region. Sinuses/Orbits: Globes and orbital soft tissues demonstrate no acute finding. Chronic frontoethmoidal and maxillary sinusitis noted, similar to previous. No significant mastoid effusion. Inner ear structures grossly normal. Other: None. IMPRESSION: 1. Marked interval improvement in appearance of the previously identified two ring-enhancing lesions involving the left cerebral hemisphere, consistent with interval response to therapy. Residual mild localized signal change and edema at these locations without significant regional mass effect. 2. Sequelae of prior brain biopsy at the left temporal lesion, with associated small dural defect and meningoencephalocele. 3. No other new intracranial lesions or other abnormality identified. 4. Chronic frontoethmoidal and maxillary sinusitis, similar to previous. Electronically Signed   By: Jeannine Boga M.D.   On: 11/08/2020 04:14   CT IMAGE GUIDED DRAINAGE BY PERCUTANEOUS CATHETER  Result Date: 11/07/2020 INDICATION: 40 year old female with history of right perirectal abscess status post percutaneous drainage catheter placement 10/07/2020 which was subsequently removed earlier this month. Presents with recurrent pain secondary to reaccumulation of abscess. EXAM: CT IMAGE GUIDED DRAINAGE BY PERCUTANEOUS CATHETER COMPARISON:  None. MEDICATIONS: The patient is currently admitted to the hospital and receiving intravenous antibiotics. The antibiotics were administered within an appropriate time frame prior to the initiation of the procedure. ANESTHESIA/SEDATION: Moderate (conscious) sedation was employed during this procedure. A total of Versed 3 mg and Fentanyl 100 mcg was administered intravenously. Moderate Sedation Time: 10 minutes. The patient's level of consciousness and vital signs were monitored continuously by radiology nursing throughout the procedure under my direct supervision. CONTRAST:  None COMPLICATIONS: None immediate. PROCEDURE: Informed written consent was obtained from the patient after a discussion of the risks, benefits and alternatives to treatment. The patient was placed prone on the CT gantry and a pre procedural CT was performed re-demonstrating the known abscess/fluid collection within the right sub gluteal, perirectal space. The procedure was planned. A timeout was performed prior to the initiation of the procedure. The medial aspect of the right gluteal region was prepped and draped in the usual sterile fashion. The overlying soft tissues were anesthetized with 1% lidocaine with epinephrine. Appropriate trajectory  was planned with the use of a 22 gauge spinal needle. An 18 gauge trocar needle was advanced into the abscess/fluid collection and a short Amplatz super stiff wire was coiled within the collection. Appropriate positioning was confirmed with a limited CT scan. The  tract was serially dilated allowing placement of a 10.2 [place] all-purpose drainage catheter. Appropriate positioning was confirmed with a limited postprocedural CT scan. Approximately 20 ml of purulent fluid was aspirated. The tube was connected to a bulb suction and sutured in place. A dressing was placed. The patient tolerated the procedure well without immediate post procedural complication. IMPRESSION: Successful CT guided placement of a 10.2 [place] all purpose drain catheter into the right perirectal abscess with aspiration of approximately 20 mL of purulent fluid. Samples were sent to the laboratory as requested by the ordering clinical team. Ruthann Cancer, MD [facility] Radiology Electronically Signed   By: Ruthann Cancer MD   On: 11/07/2020 13:47    Labs:  CBC: Recent Labs    11/08/20 0305 11/09/20 0043 11/10/20 0056 11/11/20 0032  WBC 3.3* 3.5* 3.7* 3.6*  HGB 7.3* 7.4* 7.9* 8.1*  HCT 23.7* 23.5* 25.3* 25.7*  PLT 198 178 189 211    COAGS: Recent Labs    09/29/20 1814 11/07/20 0125  INR 1.1 1.1  APTT 23*  --     BMP: Recent Labs    11/08/20 0305 11/09/20 1430 11/10/20 0056 11/11/20 0032  NA 136 136 136 135  K 4.4 4.5 4.0 4.1  CL 106 111 110 105  CO2 20* 19* 18* 23  GLUCOSE 84 80 116* 91  BUN 18 17 11 11   CALCIUM 8.5* 8.3* 8.4* 8.3*  CREATININE 2.54* 2.14* 1.86* 1.73*  GFRNONAA 24* 29* 35* 38*    LIVER FUNCTION TESTS: Recent Labs    09/29/20 1814 09/30/20 0555 10/13/20 1622 10/31/20 1126 11/05/20 1725  BILITOT 0.6 0.7 0.3 0.3 0.2*  AST 41 33 34 35* 50*  ALT 27 24 40 39* 54*  ALKPHOS 66 60 56  --  64  PROT 9.7* 9.0* 8.2* 7.2 7.5  ALBUMIN 3.6 3.2* 3.5  --  3.4*    Assessment and Plan:  40 year old female with history of right perirectal abscess s/p percutaneous drainage catheter placement with IR on 10/07/2020 which was subsequently removed on 5/6 after follow CT which showed resolution of the  perirectal abscess.  Presented to ED 5/25 with recurrent pain secondary to reaccumulation of perirectal abscess, s/p transgluteal drain placement with Dr. Serafina Royals on 5/27.   Patient stable Drain flushes/aspirates well  OP not documented x 2 days, informed RN to document OP daily  VSS CBC stable   Further treatment plan per Nephrology/CCS/ Appreciate and agree with the plan.  IR to follow.    Electronically Signed: Tera Mater, PA-C 11/11/2020, 10:31 AM   I spent a total of 15 Minutes at the the patient's bedside AND on the patient's hospital floor or unit, greater than 50% of which was counseling/coordinating care for transgluteal drain

## 2020-11-11 NOTE — Plan of Care (Signed)

## 2020-11-11 NOTE — Progress Notes (Signed)
Subjective: No new complaints   Antibiotics:  Anti-infectives (From admission, onward)   Start     Dose/Rate Route Frequency Ordered Stop   11/10/20 1000  dapsone tablet 100 mg        100 mg Oral Daily 11/10/20 0912     11/10/20 1000  azithromycin (ZITHROMAX) tablet 1,200 mg  Status:  Discontinued        1,200 mg Oral Weekly 11/10/20 0912 11/11/20 0852   11/07/20 1400  sulfaDIAZINE tablet 1,500 mg  Status:  Discontinued        1,500 mg Oral 4 times daily 11/07/20 1032 11/07/20 1254   11/07/20 1400  clindamycin (CLEOCIN) capsule 600 mg        600 mg Oral Every 6 hours 11/07/20 1254     11/07/20 0800  pyrimethamine (DARAPRIM) tablet 75 mg  Status:  Discontinued        75 mg Oral Daily with breakfast 11/06/20 0043 11/06/20 2011   11/06/20 2200  sulfaDIAZINE tablet 1,500 mg        1,500 mg Oral 4 times daily 11/06/20 1448 11/07/20 1006   11/06/20 2100  pyrimethamine (DARAPRIM) tablet 75 mg        75 mg Oral Daily with breakfast 11/06/20 2011     11/06/20 2000  dolutegravir (TIVICAY) tablet 50 mg        50 mg Oral Daily 11/06/20 1754     11/06/20 2000  lamiVUDine (EPIVIR) tablet 300 mg       Note to Pharmacy: Do NOT adjust dose based on renal fxn   300 mg Oral Daily 11/06/20 1754     11/06/20 1800  sulfaDIAZINE tablet 1,500 mg  Status:  Discontinued        1,500 mg Oral  Once 11/06/20 1321 11/07/20 0947   11/06/20 1030  sulfaDIAZINE tablet 1,500 mg        1,500 mg Oral  Once 11/06/20 1027 11/06/20 1156   11/06/20 1000  sulfaDIAZINE tablet 1,500 mg  Status:  Discontinued        1,500 mg Oral 4 times daily 11/06/20 0043 11/07/20 1032      Medications: Scheduled Meds: . clindamycin  600 mg Oral Q6H  . dapsone  100 mg Oral Daily  . docusate sodium  100 mg Oral BID  . dolutegravir  50 mg Oral Daily  . ferrous sulfate  325 mg Oral Q breakfast  . lamiVUDine  300 mg Oral Daily  . leucovorin  25 mg Oral Daily  . levETIRAcetam  750 mg Oral BID  . pantoprazole  40 mg Oral  QHS  . pyrimethamine  75 mg Oral Q breakfast  . sodium chloride flush  5 mL Intracatheter Q8H   Continuous Infusions: PRN Meds:.acetaminophen **OR** acetaminophen, calcium carbonate, phenol, promethazine    Objective: Weight change:   Intake/Output Summary (Last 24 hours) at 11/11/2020 1617 Last data filed at 11/11/2020 1220 Gross per 24 hour  Intake 240 ml  Output 900 ml  Net -660 ml   Blood pressure 110/73, pulse 77, temperature 97.8 F (36.6 C), temperature source Oral, resp. rate 17, height _0  (1.727 m), weight 74.3 kg, SpO2 99 %. Temp:  [97.8 F (36.6 C)-98 F (36.7 C)] 97.8 F (36.6 C) (05/31 1353) Pulse Rate:  [74-80] 77 (05/31 1353) Resp:  [17-18] 17 (05/31 1353) BP: (110-120)/(67-81) 110/73 (05/31 1353) SpO2:  [98 %-100 %] 99 % (05/31 1353)  Physical Exam: Physical Exam Constitutional:  General: She is not in acute distress.    Appearance: She is well-developed. She is not diaphoretic.  HENT:     Head: Normocephalic and atraumatic.     Right Ear: External ear normal.     Left Ear: External ear normal.     Mouth/Throat:     Pharynx: No oropharyngeal exudate.  Eyes:     General: No scleral icterus.    Extraocular Movements: Extraocular movements intact.     Conjunctiva/sclera: Conjunctivae normal.  Cardiovascular:     Rate and Rhythm: Normal rate and regular rhythm.     Heart sounds: Normal heart sounds. No murmur heard. No friction rub. No gallop.   Pulmonary:     Effort: Pulmonary effort is normal. No respiratory distress.     Breath sounds: Normal breath sounds. No wheezing or rales.  Abdominal:     General: Bowel sounds are normal. There is no distension.     Palpations: Abdomen is soft.     Tenderness: There is no abdominal tenderness. There is no rebound.  Musculoskeletal:        General: No tenderness. Normal range of motion.  Lymphadenopathy:     Cervical: No cervical adenopathy.  Skin:    General: Skin is warm and dry.     Coloration:  Skin is not pale.     Findings: No erythema or rash.  Neurological:     General: No focal deficit present.     Mental Status: She is alert and oriented to person, place, and time.     Motor: No abnormal muscle tone.     Coordination: Coordination normal.  Psychiatric:        Mood and Affect: Mood normal.        Behavior: Behavior normal.        Thought Content: Thought content normal.        Judgment: Judgment normal.   She has drain in place she shows me at the bedside CBC:    BMET Recent Labs    11/10/20 0056 11/11/20 0032  NA 136 135  K 4.0 4.1  CL 110 105  CO2 18* 23  GLUCOSE 116* 91  BUN 11 11  CREATININE 1.86* 1.73*  CALCIUM 8.4* 8.3*     Liver Panel  No results for input(s): PROT, ALBUMIN, AST, ALT, ALKPHOS, BILITOT, BILIDIR, IBILI in the last 72 hours.     Sedimentation Rate No results for input(s): ESRSEDRATE in the last 72 hours. C-Reactive Protein No results for input(s): CRP in the last 72 hours.  Micro Results: Recent Results (from the past 720 hour(s))  Urine culture     Status: Abnormal   Collection Time: 11/05/20 11:03 PM   Specimen: Urine, Random  Result Value Ref Range Status   Specimen Description URINE, RANDOM  Final   Special Requests NONE  Final   Culture (A)  Final    <10,000 COLONIES/mL INSIGNIFICANT GROWTH Performed at [facility], [address], [place], [place] [address]    Report Status 11/07/2020 FINAL  Final  SARS CORONAVIRUS 2 (TAT 6-24 HRS) Nasopharyngeal Nasopharyngeal Swab     Status: None   Collection Time: 11/06/20 12:14 AM   Specimen: Nasopharyngeal Swab  Result Value Ref Range Status   SARS Coronavirus 2 NEGATIVE NEGATIVE Final    Comment: (NOTE) SARS-CoV-2 target nucleic acids are NOT DETECTED.  The SARS-CoV-2 RNA is generally detectable in upper and lower respiratory specimens during the acute phase of infection. Negative results do not  preclude SARS-CoV-2 infection, do not rule out co-infections  with other pathogens, and should not be used as the sole basis for treatment or other patient management decisions. Negative results must be combined with clinical observations, patient history, and epidemiological information. The expected result is Negative.  Fact Sheet for Patients: SugarRoll.be  Fact Sheet for Healthcare Providers: https://www.woods-mathews.com/  This test is not yet approved or cleared by the [place] FDA and  has been authorized for detection and/or diagnosis of SARS-CoV-2 by FDA under an Emergency Use Authorization (EUA). This EUA will remain  in effect (meaning this test can be used) for the duration of the COVID-19 declaration under Se ction 564(b)(1) of the Act, 21 U.S.C. section 360bbb-3(b)(1), unless the authorization is terminated or revoked sooner.  Performed at [facility], [place] [address], [place], [place] [address]   Aerobic/Anaerobic Culture w Gram Stain (surgical/deep wound)     Status: None (Preliminary result)   Collection Time: 11/07/20 10:10 AM   Specimen: Abscess  Result Value Ref Range Status   Specimen Description ABSCESS  Final   Special Requests PELVIC RIGHT  Final   Gram Stain   Final    ABUNDANT WBC PRESENT, PREDOMINANTLY PMN NO ORGANISMS SEEN    Culture   Final    NO GROWTH 4 DAYS NO ANAEROBES ISOLATED; CULTURE IN PROGRESS FOR 5 DAYS Performed at [facility], [place] [address] [place], [place] [address]    Report Status PENDING  Incomplete  Acid Fast Smear (AFB)     Status: None   Collection Time: 11/07/20 10:10 AM   Specimen: Perirectal; Abscess  Result Value Ref Range Status   AFB Specimen Processing Concentration  Final   Acid Fast Smear Negative  Final    Comment: (NOTE) Performed At: [facility] [place], [place] [id number] Rush Farmer MD DU:[id number]    Source (AFB) PELVIC RIGHT  Final    Comment: Performed at [place], [place] [address] [place],  [address]    Studies/Results: No results found.    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  Is also complaining of headaches and blurry vision we saw her last week.  1.  Acute renal failure: I continue to believe that this was more likely precipitated by obstructive pathology.  We have also though removed tenofovir thrown her regimen.  Her HIV regimen will be single tablet Dovato going forward we do not yet have this on the hospital formulary so we will need to procure it and fill her pillboxes with it when she leaves  She is on dapsone for PCP prevention  She is going to follow-up with urology as an outpatient and also with gynecology  #2 Enlarged uterus possible fibroids versus leiomyosarcoma: It is interesting to me that her acute renal failure and sending obstructive pathology occurred in the context of heavy menstrual bleed bleeding.  I hope she can  be seen gynecology in follow-up.  I do have anxiety that obstructive pathology will recur potentially compromise her kidney function once again.  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  IR following as well have been surgery there is some concerned about a fistula potentially between bowel and area where the abscess was found  #6 Leukopenia and anemia: Likely multifactorial I discontinued her M AVM prophylaxis in case she ends up getting a bone marrow biopsy is that we might build to send for AFB stain and cultures  I spent greater than 40 minutes with the patient including greater than 50% of time in face to face counsel of the patient using iPad [place] translator reviewing all of her different problems diagnostic work-up so far and treatment plan reviewing her radiographs personally as well as her laboratory microbiological data and in coordination of her care.  NOTE I AM CONCERNED WITH HER LACK OF INSURANCE ON HOW SHE IS GOING TO FOLLOWUP NOT JUST WITH UROLOGY, GYNECOLOGY, IR AND [facility]  (clearly no issue seeing [place] due to [employer])  Is there a fool proof mechanism for her outpatient care to be covered or is she going to get bills or face charges at front desks that prohibit her being seen.    LOS: 5 days   Alcide Evener 11/11/2020, 4:17 PM

## 2020-11-12 ENCOUNTER — Inpatient Hospital Stay (HOSPITAL_COMMUNITY): Payer: Medicaid Other

## 2020-11-12 ENCOUNTER — Other Ambulatory Visit: Payer: Self-pay | Admitting: Pharmacist

## 2020-11-12 DIAGNOSIS — B582 Toxoplasma meningoencephalitis: Secondary | ICD-10-CM

## 2020-11-12 LAB — AEROBIC/ANAEROBIC CULTURE W GRAM STAIN (SURGICAL/DEEP WOUND): Culture: NO GROWTH

## 2020-11-12 MED ORDER — DAPSONE 100 MG PO TABS: 100.0000 mg | ORAL_TABLET | Freq: Every day | ORAL | 0 refills | Status: DC

## 2020-11-12 MED ORDER — DOVATO 50-300 MG PO TABS: 1.0000 | ORAL_TABLET | Freq: Every day | ORAL | 2 refills | Status: DC

## 2020-11-12 MED ORDER — DOVATO 50-300 MG PO TABS: 1.0000 | ORAL_TABLET | Freq: Every day | ORAL | 3 refills | Status: DC

## 2020-11-12 MED ORDER — DAPSONE 100 MG PO TABS: 100.0000 mg | ORAL_TABLET | Freq: Every day | ORAL | 2 refills | Status: DC

## 2020-11-12 MED ORDER — CLINDAMYCIN HCL 300 MG PO CAPS: 600.0000 mg | ORAL_CAPSULE | Freq: Four times a day (QID) | ORAL | 0 refills | Status: DC

## 2020-11-12 NOTE — Progress Notes (Addendum)
Subjective: She continues to feel better   Antibiotics:  Anti-infectives (From admission, onward)   Start     Dose/Rate Route Frequency Ordered Stop   11/12/20 0000  dapsone 100 MG tablet  Status:  Discontinued        100 mg Oral Daily 11/12/20 0850 11/12/20    11/12/20 0000  Dolutegravir-lamiVUDine (DOVATO) 50-300 MG TABS  Status:  Discontinued        1 tablet Oral Daily 11/12/20 0850 11/12/20    11/12/20 0000  dapsone 100 MG tablet        100 mg Oral Daily 11/12/20 1216 02/10/21 2359   11/12/20 0000  Dolutegravir-lamiVUDine (DOVATO) 50-300 MG TABS        1 tablet Oral Daily 11/12/20 1216     11/10/20 1000  dapsone tablet 100 mg        100 mg Oral Daily 11/10/20 0912     11/10/20 1000  azithromycin (ZITHROMAX) tablet 1,200 mg  Status:  Discontinued        1,200 mg Oral Weekly 11/10/20 0912 11/11/20 0852   11/07/20 1400  sulfaDIAZINE tablet 1,500 mg  Status:  Discontinued        1,500 mg Oral 4 times daily 11/07/20 1032 11/07/20 1254   11/07/20 1400  clindamycin (CLEOCIN) capsule 600 mg        600 mg Oral Every 6 hours 11/07/20 1254     11/07/20 0800  pyrimethamine (DARAPRIM) tablet 75 mg  Status:  Discontinued        75 mg Oral Daily with breakfast 11/06/20 0043 11/06/20 2011   11/06/20 2200  sulfaDIAZINE tablet 1,500 mg        1,500 mg Oral 4 times daily 11/06/20 1448 11/07/20 1006   11/06/20 2100  pyrimethamine (DARAPRIM) tablet 75 mg        75 mg Oral Daily with breakfast 11/06/20 2011     11/06/20 2000  dolutegravir (TIVICAY) tablet 50 mg        50 mg Oral Daily 11/06/20 1754     11/06/20 2000  lamiVUDine (EPIVIR) tablet 300 mg       Note to Pharmacy: Do NOT adjust dose based on renal fxn   300 mg Oral Daily 11/06/20 1754     11/06/20 1800  sulfaDIAZINE tablet 1,500 mg  Status:  Discontinued        1,500 mg Oral  Once 11/06/20 1321 11/07/20 0947   11/06/20 1030  sulfaDIAZINE tablet 1,500 mg        1,500 mg Oral  Once 11/06/20 1027 11/06/20 1156   11/06/20 1000   sulfaDIAZINE tablet 1,500 mg  Status:  Discontinued        1,500 mg Oral 4 times daily 11/06/20 0043 11/07/20 1032      Medications: Scheduled Meds: . clindamycin  600 mg Oral Q6H  . dapsone  100 mg Oral Daily  . docusate sodium  100 mg Oral BID  . dolutegravir  50 mg Oral Daily  . ferrous sulfate  325 mg Oral Q breakfast  . lamiVUDine  300 mg Oral Daily  . leucovorin  25 mg Oral Daily  . levETIRAcetam  750 mg Oral BID  . pantoprazole  40 mg Oral QHS  . pyrimethamine  75 mg Oral Q breakfast  . sodium chloride flush  5 mL Intracatheter Q8H   Continuous Infusions: PRN Meds:.acetaminophen **OR** acetaminophen, calcium carbonate, phenol, promethazine    Objective: Weight change:  Intake/Output Summary (Last 24 hours) at 11/12/2020 1238 Last data filed at 11/12/2020 0528 Gross per 24 hour  Intake 440 ml  Output 303 ml  Net 137 ml   Blood pressure 100/75, pulse 75, temperature 98.1 F (36.7 C), temperature source Oral, resp. rate 18, height _0  (1.727 m), weight 74.3 kg, SpO2 100 %. Temp:  [97.8 F (36.6 C)-98.1 F (36.7 C)] 98.1 F (36.7 C) (06/01 0400) Pulse Rate:  [75-79] 75 (06/01 0400) Resp:  [17-18] 18 (06/01 0400) BP: (100-117)/(73-92) 100/75 (06/01 0400) SpO2:  [98 %-100 %] 100 % (06/01 0400)  Physical Exam: Physical Exam Constitutional:      General: She is not in acute distress.    Appearance: She is well-developed. She is not diaphoretic.  HENT:     Head: Normocephalic and atraumatic.     Right Ear: External ear normal.     Left Ear: External ear normal.     Mouth/Throat:     Pharynx: No oropharyngeal exudate.  Eyes:     General: No scleral icterus.    Extraocular Movements: Extraocular movements intact.     Conjunctiva/sclera: Conjunctivae normal.  Cardiovascular:     Rate and Rhythm: Normal rate and regular rhythm.     Heart sounds: Normal heart sounds. No murmur heard. No friction rub. No gallop.   Pulmonary:     Effort: Pulmonary effort is  normal. No respiratory distress.     Breath sounds: Normal breath sounds. No wheezing or rales.  Abdominal:     General: Bowel sounds are normal. There is no distension.     Palpations: Abdomen is soft.     Tenderness: There is no abdominal tenderness. There is no rebound.  Musculoskeletal:        General: No tenderness. Normal range of motion.  Lymphadenopathy:     Cervical: No cervical adenopathy.  Skin:    General: Skin is warm and dry.     Coloration: Skin is not pale.     Findings: No erythema or rash.  Neurological:     General: No focal deficit present.     Mental Status: She is alert and oriented to person, place, and time.     Motor: No abnormal muscle tone.     Coordination: Coordination normal.  Psychiatric:        Mood and Affect: Mood normal.        Behavior: Behavior normal.        Thought Content: Thought content normal.        Judgment: Judgment normal.   She has drain in place she shows me at the bedside CBC:    BMET Recent Labs    11/10/20 0056 11/11/20 0032  NA 136 135  K 4.0 4.1  CL 110 105  CO2 18* 23  GLUCOSE 116* 91  BUN 11 11  CREATININE 1.86* 1.73*  CALCIUM 8.4* 8.3*     Liver Panel  No results for input(s): PROT, ALBUMIN, AST, ALT, ALKPHOS, BILITOT, BILIDIR, IBILI in the last 72 hours.     Sedimentation Rate No results for input(s): ESRSEDRATE in the last 72 hours. C-Reactive Protein No results for input(s): CRP in the last 72 hours.  Micro Results: Recent Results (from the past 720 hour(s))  Urine culture     Status: Abnormal   Collection Time: 11/05/20 11:03 PM   Specimen: Urine, Random  Result Value Ref Range Status   Specimen Description URINE, RANDOM  Final   Special Requests NONE  Final  Culture (A)  Final    <10,000 COLONIES/mL INSIGNIFICANT GROWTH Performed at [facility] [address] [place], [place] [address]    Report Status 11/07/2020 FINAL  Final  SARS CORONAVIRUS 2 (TAT 6-24 HRS) Nasopharyngeal  Nasopharyngeal Swab     Status: None   Collection Time: 11/06/20 12:14 AM   Specimen: Nasopharyngeal Swab  Result Value Ref Range Status   SARS Coronavirus 2 NEGATIVE NEGATIVE Final    Comment: (NOTE) SARS-CoV-2 target nucleic acids are NOT DETECTED.  The SARS-CoV-2 RNA is generally detectable in upper and lower respiratory specimens during the acute phase of infection. Negative results do not preclude SARS-CoV-2 infection, do not rule out co-infections with other pathogens, and should not be used as the sole basis for treatment or other patient management decisions. Negative results must be combined with clinical observations, patient history, and epidemiological information. The expected result is Negative.  Fact Sheet for Patients: SugarRoll.be  Fact Sheet for Healthcare Providers: https://www.woods-mathews.com/  This test is not yet approved or cleared by the [place] FDA and  has been authorized for detection and/or diagnosis of SARS-CoV-2 by FDA under an Emergency Use Authorization (EUA). This EUA will remain  in effect (meaning this test can be used) for the duration of the COVID-19 declaration under Se ction 564(b)(1) of the Act, 21 U.S.C. section 360bbb-3(b)(1), unless the authorization is terminated or revoked sooner.  Performed at [facility], [place] [address], [place], [place] [address]   Aerobic/Anaerobic Culture w Gram Stain (surgical/deep wound)     Status: None (Preliminary result)   Collection Time: 11/07/20 10:10 AM   Specimen: Abscess  Result Value Ref Range Status   Specimen Description ABSCESS  Final   Special Requests PELVIC RIGHT  Final   Gram Stain   Final    ABUNDANT WBC PRESENT, PREDOMINANTLY PMN NO ORGANISMS SEEN    Culture   Final    NO GROWTH 4 DAYS NO ANAEROBES ISOLATED; CULTURE IN PROGRESS FOR 5 DAYS Performed at [facility], [place] [address], [place], [place] [address]    Report  Status PENDING  Incomplete  Acid Fast Smear (AFB)     Status: None   Collection Time: 11/07/20 10:10 AM   Specimen: Perirectal; Abscess  Result Value Ref Range Status   AFB Specimen Processing Concentration  Final   Acid Fast Smear Negative  Final    Comment: (NOTE) Performed At: [facility] [place], [place] [id number] Rush Farmer MD ZW:[id number]    Source (AFB) PELVIC RIGHT  Final    Comment: Performed at [facility], [place] [address] [place], [place] [address]    Studies/Results: No results found.    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  Is also complaining of headaches and blurry vision we saw her last week.  1.  Acute renal failure: I continue to believe that this was more likely precipitated by  obstructive pathology.  We have also though removed tenofovir thrown her regimen.  Her HIV regimen will be single tablet Dovato going forward we do not yet have this on the hospital formulary so we will need to procure it and fill her pillboxes with it when she leaves  She is on dapsone for PCP prevention  She is going to follow-up with urology as an outpatient and also with gynecology  #2 Enlarged uterus possible fibroids versus leiomyosarcoma: It is interesting to me that her acute renal failure and sending obstructive pathology occurred in the context of heavy menstrual bleed bleeding.  I  hope she can be seen gynecology in follow-up.  I do have anxiety that obstructive pathology will recur potentially compromise her kidney function once again.  #3 CNS toxoplasmosis: This is responded nicely to therapy we did exchange sulfadiazine to cover for clindamycin given her GI upset  We will need to fill out her pillboxes again with clindamycin substituted for sulfadiazine prior to discharge  #4 blurry vision: She claims this is now resolved so I would not push for ophthalmology consult  #5 perirectal abscess: Cultures unrevealing so far she is only on clindamycin.  IR following as well have been surgery there is some concerned about a fistula potentially between bowel and area where the abscess was found  #6 Leukopenia and anemia: Likely multifactorial I discontinued her M AVM prophylaxis in case she ends up getting a bone marrow biopsy is that we might build to send for AFB stain and cultures  NOTE her new medications have been sent to [facility] on [address] ([place]) and our pharmacist will obtain them and fill pillboxes tomorrow.   Candace Jones has an appointment on 11/20/2020 at 945 AM with Janene Madeira, NP  The [facility] for Infectious Disease is located in the [facility] at  [place] in [place].  [address], which is located to the left of the elevators.  Phone: [phone]  Fax: [phone]  [email or web address]  She should arrive 15 to 30 minutes prior to her appointment.  I spent greater than 35 minutes with the patient including greater than 50% of time in face to face counsel of the patient and in coordination of her care.    LOS: 6 days   Alcide Evener 11/12/2020, 12:38 PM

## 2020-11-12 NOTE — Progress Notes (Signed)
PROGRESS NOTE    Candace Jones  YDX:[id number] DOB: 02-23-1981 DOA: 11/05/2020 PCP: Patient, No Pcp Per (Inactive)   No chief complaint on file.   Brief Narrative:  Candace Jones is Candace Jones 40 y.o. female history of AIDS, toxoplasma meningoencephalitis, recent perirectal abscess. 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 She has been taking ibuprofen to help with headaches. CT imaging with evidence of ureteral mass every and resultant mild-moderate bilateral hydronephrosis; discussed with urology who states this chronic issues likely is not accounting for AKI. Complicated by patient's AIDS status. Improvement with IV fluids; IV fluids discontinued.. -Nephrology recommendation: Signed off. -Daily BMP -creatinine improving, follow (baseline creatinine appears to be 0.71)  Gross hematuria Seems this may have been vaginal bleeding rather than hematuria. Hematuria has resolved with resolution of vaginal bleeding. Discussed with urology who recommended outpatient urology resident clinic visit.  Acute blood loss anemia Acute on chronic anemia Patient with chronic anemia.  Previous iron panel from April 2022 significant for iron deficiency anemia.  Patient also with recent gross hematuria which likely contributed to acute component.  Secondary to uterine fibroids and resultant  heavy menstrual bleeding. Baseline hemoglobin of about 8-9. Hemoglobin down to 7.3 and stable.  Reticulocyte count is uncharacteristically low, so hematology consulted. LDH minimally elevated at 204. Haptoglobin normal. -Serial CBC, transfuse as needed for hemoglobin less than 7 -Hematology recommendations: MMA, erythropoietin, homocysteine (wnl), parvovirus B19 serology (positiv IgG, negative IgM)/PCR pending  Perirectal abscess Previously treated with percutaneous drain placement and antibiotics in April 2022.  Complicated by AIDS diagnosis. -General surgery recommendations: recommendation for drain injection prior to removal -IR recommendations: follow-up at drain clinic 10-14 days s/p discharge for CT/drain injection  AIDS Newly diagnosed one year prior. Undetectable CD4 count.  Viral load of 57,200 copies per milliliter prior to admission; 100 copies per milliliter on 5/26.  Patient is on [place] as an outpatient which has been held secondary to severe renal impairment. -Infectious disease recommendations: dolutegravir and lamivudine -> plan for d/c on dovato, dapsone for PCP prevention  Uterine fibroids Previously diagnosed as fibroids vs leiomyosarcoma. Gynecology consulted at previous hospitalization who recommended outpatient follow-up and per assessment, unlikely leiomyosarcoma. Gynecology called for set up of outpatient follow-up. Attending, Dr. Elly Modena will send Candace Jones message for office visit in 2 weeks; phone number [phone] at [facility] women on [address].  Hyperkalemia Mildly elevated secondary to AKI. Given one dose of Lokelma. Resolved.  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 Started on sulfadiazine and pyrimethamine which were continued on admission -Infectious disease recommendations: added clindamycin and discontinued sulfadiazine  DVT prophylaxis:SCD Code Status: full  Family Communication: none at bedside Disposition:   Status is:  Inpatient  Remains inpatient appropriate because:Inpatient level of care appropriate due to severity of illness   Dispo: The patient is from: Home              Anticipated d/c is  to: Home              Patient currently is not medically stable to d/c.   Difficult to place patient No       Consultants:   ID  Nephrology  Surgery  IR  oncology  Procedures:   Right pelvic drain lacement 5/27  Antimicrobials:  Anti-infectives (From admission, onward)   Start     Dose/Rate Route Frequency Ordered Stop   11/12/20 0000  dapsone 100 MG tablet  Status:  Discontinued        100 mg Oral Daily 11/12/20 0850 11/12/20    11/12/20 0000  Dolutegravir-lamiVUDine (DOVATO) 50-300 MG TABS  Status:  Discontinued        1 tablet Oral Daily 11/12/20 0850 11/12/20    11/12/20 0000  dapsone 100 MG tablet        100 mg Oral Daily 11/12/20 1216 02/10/21 2359   11/12/20 0000  Dolutegravir-lamiVUDine (DOVATO) 50-300 MG TABS        1 tablet Oral Daily 11/12/20 1216     11/10/20 1000  dapsone tablet 100 mg        100 mg Oral Daily 11/10/20 0912     11/10/20 1000  azithromycin (ZITHROMAX) tablet 1,200 mg  Status:  Discontinued        1,200 mg Oral Weekly 11/10/20 0912 11/11/20 0852   11/07/20 1400  sulfaDIAZINE tablet 1,500 mg  Status:  Discontinued        1,500 mg Oral 4 times daily 11/07/20 1032 11/07/20 1254   11/07/20 1400  clindamycin (CLEOCIN) capsule 600 mg        600 mg Oral Every 6 hours 11/07/20 1254     11/07/20 0800  pyrimethamine (DARAPRIM) tablet 75 mg  Status:  Discontinued        75 mg Oral Daily with breakfast 11/06/20 0043 11/06/20 2011   11/06/20 2200  sulfaDIAZINE tablet 1,500 mg        1,500 mg Oral 4 times daily 11/06/20 1448 11/07/20 1006   11/06/20 2100  pyrimethamine (DARAPRIM) tablet 75 mg        75 mg Oral Daily with breakfast 11/06/20 2011     11/06/20 2000  dolutegravir (TIVICAY) tablet 50 mg        50 mg Oral Daily 11/06/20 1754     11/06/20 2000  lamiVUDine  (EPIVIR) tablet 300 mg       Note to Pharmacy: Do NOT adjust dose based on renal fxn   300 mg Oral Daily 11/06/20 1754     11/06/20 1800  sulfaDIAZINE tablet 1,500 mg  Status:  Discontinued        1,500 mg Oral  Once 11/06/20 1321 11/07/20 0947   11/06/20 1030  sulfaDIAZINE tablet 1,500 mg        1,500 mg Oral  Once 11/06/20 1027 11/06/20 1156   11/06/20 1000  sulfaDIAZINE tablet 1,500 mg  Status:  Discontinued        1,500 mg Oral 4 times daily 11/06/20 0043 11/07/20 1032         Subjective: C/o some SOB, not other complaints [place] interpreter used  Objective: Vitals:   11/11/20 1353 11/11/20 2124 11/12/20 0400 11/12/20 1441  BP: 110/73 (!) 117/92 100/75 117/87  Pulse: 77 79 75 73  Resp: 17 18 18 17   Temp: 97.8 F (36.6 C) 98 F (36.7 C) 98.1 F (36.7 C) 98.6 F (37 C)  TempSrc: Oral Oral Oral Oral  SpO2: 99%  98% 100% 99%  Weight:      Height:        Intake/Output Summary (Last 24 hours) at 11/12/2020 1904 Last data filed at 11/12/2020 1811 Gross per 24 hour  Intake 210 ml  Output 213 ml  Net -3 ml   Filed Weights   11/09/20 2100  Weight: 74.3 kg    Examination:  General exam: Appears calm and comfortable  Respiratory system: unlabored Cardiovascular system: RRR Gastrointestinal system: Abdomen is nondistended, soft and nontender Drain in place Central nervous system: Alert and oriented. No focal neurological deficits. Extremities: no LEE Psychiatry: Judgement and insight appear normal. Mood & affect appropriate.     Data Reviewed: I have personally reviewed following labs and imaging studies  CBC: Recent Labs  Lab 11/07/20 0125 11/08/20 0305 11/09/20 0043 11/10/20 0056 11/11/20 0032  WBC 3.7* 3.3* 3.5* 3.7* 3.6*  HGB 7.3* 7.3* 7.4* 7.9* 8.1*  HCT 23.5* 23.7* 23.5* 25.3* 25.7*  MCV 75.1* 75.2* 76.8* 78.3* 77.9*  PLT 193 198 178 189 272    Basic Metabolic Panel: Recent Labs  Lab 11/07/20 0125 11/08/20 0305 11/09/20 1430 11/10/20 0056  11/11/20 0032  NA 136 136 136 136 135  K 4.4 4.4 4.5 4.0 4.1  CL 108 106 111 110 105  CO2 22 20* 19* 18* 23  GLUCOSE 93 84 80 116* 91  BUN 26* 18 17 11 11   CREATININE 3.42* 2.54* 2.14* 1.86* 1.73*  CALCIUM 8.5* 8.5* 8.3* 8.4* 8.3*    GFR: Estimated Creatinine Clearance: 43.6 mL/min (Rico Massar) (by C-G formula based on SCr of 1.73 mg/dL (H)).  Liver Function Tests: No results for input(s): AST, ALT, ALKPHOS, BILITOT, PROT, ALBUMIN in the last 168 hours.  CBG: No results for input(s): GLUCAP in the last 168 hours.   Recent Results (from the past 240 hour(s))  Urine culture     Status: Abnormal   Collection Time: 11/05/20 11:03 PM   Specimen: Urine, Random  Result Value Ref Range Status   Specimen Description URINE, RANDOM  Final   Special Requests NONE  Final   Culture ([place]reck)  Final    <10,000 COLONIES/mL INSIGNIFICANT GROWTH Performed at [facility], [address] [place],  [address]    Report Status 11/07/2020 FINAL  Final  SARS CORONAVIRUS 2 (TAT 6-24 HRS) Nasopharyngeal Nasopharyngeal Swab     Status: None   Collection Time: 11/06/20 12:14 AM   Specimen: Nasopharyngeal Swab  Result Value Ref Range Status   SARS Coronavirus 2 NEGATIVE NEGATIVE Final    Comment: (NOTE) SARS-CoV-2 target nucleic acids are NOT DETECTED.  The SARS-CoV-2 RNA is generally detectable in upper and lower respiratory specimens during the acute phase of infection. Negative results do not preclude SARS-CoV-2 infection, do not rule out co-infections with other pathogens, and should not be used as the sole basis for treatment or other patient management decisions. Negative results must be combined with clinical observations, patient history, and epidemiological information. The expected result is Negative.  Fact Sheet for Patients: SugarRoll.be  Fact Sheet for Healthcare Providers: https://www.woods-mathews.com/  This test is not yet approved or  cleared by the [place] FDA and  has been authorized for detection and/or diagnosis of SARS-CoV-2 by FDA under an Emergency Use Authorization (EUA). This EUA will remain  in effect (meaning this test can be used) for the duration of the COVID-19 declaration under Se ction 564(b)(1) of the Act, 21 U.S.C. section 360bbb-3(b)(1), unless the authorization is terminated or revoked sooner.  Performed at [facility], [place] [address] [place], [place] [address]   Aerobic/Anaerobic Culture w Gram Stain (surgical/deep wound)     Status: None   Collection Time: 11/07/20 10:10 AM   Specimen: Abscess  Result Value Ref Range Status   Specimen Description ABSCESS  Final   Special Requests PELVIC RIGHT  Final   Gram Stain   Final    ABUNDANT WBC PRESENT, PREDOMINANTLY PMN NO ORGANISMS SEEN    Culture   Final    No growth aerobically or anaerobically. Performed at [facility], [place] [address] [place], [place] [address]    Report Status 11/12/2020 FINAL  Final  Acid Fast Smear (AFB)     Status: None   Collection Time: 11/07/20 10:10 AM   Specimen: Perirectal; Abscess  Result Value Ref Range Status   AFB Specimen Processing Concentration  Final   Acid Fast Smear Negative  Final    Comment: (NOTE) Performed At: [facility] [place], [place] [id number] Rush Farmer MD [id number]    Source (AFB) PELVIC RIGHT  Final    Comment: Performed at [facility], [place] [address], [place], [place] [address]         Radiology Studies: No results found.      Scheduled Meds: . clindamycin  600 mg Oral Q6H  . dapsone  100 mg Oral Daily  . docusate sodium  100 mg Oral BID  . dolutegravir  50 mg Oral Daily  . ferrous sulfate  325 mg Oral Q breakfast  . lamiVUDine  300 mg Oral Daily  . leucovorin  25 mg Oral Daily  . levETIRAcetam  750 mg Oral BID  . pantoprazole  40 mg Oral QHS  . pyrimethamine  75 mg Oral Q breakfast  . sodium chloride flush   5 mL Intracatheter Q8H   Continuous Infusions:   LOS: 6 days    Time spent: over 30 min    Fayrene Helper, MD [facility]   To contact the attending provider between 7A-7P or the covering provider during after hours 7P-7A, please log into the web site [email or web address] and access using universal [place] password for that web site. If you do not have the password, please call the hospital operator.  11/12/2020, 7:04 PM

## 2020-11-12 NOTE — Progress Notes (Signed)
Brief Hematology Update Progress Note  S: Candace Jones 40 y.o. female with medical history significant for HIV and recent toxoplasma meningoencephalitis who presents with a worsening AKI and severe anemia.  O:  Vitals:   11/11/20 2124 11/12/20 0400  BP: (!) 117/92 100/75  Pulse: 79 75  Resp: 18 18  Temp: 98 F (36.7 C) 98.1 F (36.7 C)  SpO2: 98% 100%   CMP Latest Ref Rng & Units 11/11/2020 11/10/2020 11/09/2020  Glucose 70 - 99 mg/dL 91 116(H) 80  BUN 6 - 20 mg/dL _0 Creatinine 0.44 - 1.00 mg/dL 1.73(H) 1.86(H) 2.14(H)  Sodium 135 - 145 mmol/L 135 136 136  Potassium 3.5 - 5.1 mmol/L 4.1 4.0 4.5  Chloride 98 - 111 mmol/L 105 110 111  CO2 22 - 32 mmol/L 23 18(L) 19(L)  Calcium 8.9 - 10.3 mg/dL 8.3(L) 8.4(L) 8.3(L)  Total Protein 6.5 - 8.1 g/dL - - -  Total Bilirubin 0.3 - 1.2 mg/dL - - -  Alkaline Phos 38 - 126 U/L - - -  AST 15 - 41 U/L - - -  ALT 0 - 44 U/L - - -   CBC Latest Ref Rng & Units 11/11/2020 11/10/2020 11/09/2020  WBC 4.0 - 10.5 K/uL 3.6(L) 3.7(L) 3.5(L)  Hemoglobin 12.0 - 15.0 g/dL 8.1(L) 7.9(L) 7.4(L)  Hematocrit 36.0 - 46.0 % 25.7(L) 25.3(L) 23.5(L)  Platelets 150 - 400 K/uL 211 189 178   A/P:  # 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 Differential diagnosis is broad but could include HIV induced anemia, co-infection with Parvo B19, severe nutritional deficiency, or primary bone marrow disorder/hematological malignancy.  --workup so far has been unremarkable. No evidence of hemolysis with LDH/Haptoglobin at near normal levels. Homocysteine normal, no evidence of nutritional deficiency.  --no findings to suggest that patient has a bone marrow disorder/hematological malignancy, though may need to consider bone marrow biopsy if no clear etiology can be determined -- extensive imaging of the abdomen/pelvis during this admission does not show any evidence of  lymphadenopathy. Prior imaging from 09/29/2020 shows no concerning findings in the chest.  --recommend transfusion for Hgb <7.0 or if symptomatic at Hgb <8.0 --no barrier to d/c from a hematological perspective. Recommend close f/u with ID and outpatient referral to our clinic if the patients Hgb fails to improve with appropriate HIV treatment.   Ledell Peoples, MD Department of Hematology/Oncology [place] [facility] Phone: [phone] Pager: [phone] Email: [email or web address]

## 2020-11-12 NOTE — Telephone Encounter (Signed)
Spoke to the pharmacist at [facility] in [place]. Sulfadiazine is not available from any manufacturer. Patient was switched to clindamycin in hospital in it's place, so sent Rx to [facility] to substitute. [facility] will contact patient/patient's sister for mailing.

## 2020-11-12 NOTE — Progress Notes (Signed)
Referring Physician(s): Saverio Danker PA-C   Supervising Physician: Jacqulynn Cadet  Patient Status:  [facility] - In-pt  Chief Complaint:  S/p right transgluteal drain placement with Dr. Serafina Royals on 5/27   Subjective:  Pt laying in bed, not in acute distress.  States no pain around the right buttocks area.  Intermittent nausea.   Allergies: Patient has no known allergies.  Medications: Prior to Admission medications   Medication Sig Start Date End Date Taking? Authorizing Provider  bictegravir-emtricitabine-tenofovir AF (BIKTARVY) 50-200-25 MG TABS tablet Take 1 tablet by mouth daily. 10/17/20  Yes Ionia Callas, NP  docusate sodium (COLACE) 100 MG capsule Take 1 capsule (100 mg total) by mouth 2 (two) times daily. 10/10/20  Yes Bonnielee Haff, MD  ferrous sulfate 325 (65 FE) MG tablet Take 1 tablet (325 mg total) by mouth daily with breakfast. 10/10/20 01/08/21 Yes [place] Donnamarie Poag, DO  ibuprofen (ADVIL) 200 MG tablet Take 400 mg by mouth every 6 (six) hours as needed for headache or moderate pain.   Yes [provider]  levETIRAcetam (KEPPRA) 750 MG tablet Take 1 tablet (750 mg total) by mouth 2 (two) times daily. 10/31/20 01/29/21 Yes [place] Callas, NP  promethazine (PHENERGAN) 12.5 MG tablet Take 1 tablet (12.5 mg total) by mouth every 6 (six) hours as needed for nausea or vomiting. 10/31/20  Yes Kuppelweiser, Cassie L, RPH-CPP  sulfaDIAZINE 500 MG tablet Take 3 tablets (1,500 mg total) by mouth 4 (four) times daily for 12 days. 11/07/20 11/19/20 Yes [place] Callas, NP  leucovorin (WELLCOVORIN) 25 MG tablet Take 1 tablet (25 mg total) by mouth daily. 11/06/20   Kuppelweiser, Cassie L, RPH-CPP  omeprazole (PRILOSEC) 20 MG capsule Take 1 capsule (20 mg total) by mouth daily. 11/06/20   Kuppelweiser, Cassie L, RPH-CPP  pyrimethamine (DARAPRIM) 25 MG tablet Take 3 tablets (75 mg total) by mouth daily with breakfast. 11/07/20   Kuppelweiser, Cassie L, RPH-CPP     Vital  Signs: BP 100/75 (BP Location: Right Arm)   Pulse 75   Temp 98.1 F (36.7 C) (Oral)   Resp 18   Ht 5\' 8"  (1.727 m)   Wt 163 lb 12.8 oz (74.3 kg)   SpO2 100%   BMI 24.91 kg/m   Physical Exam Constitutional:      General: She is not in acute distress.    Appearance: Normal appearance. She is not ill-appearing.  HENT:     Head: Normocephalic and atraumatic.  Cardiovascular:     Rate and Rhythm: Normal rate.  Pulmonary:     Effort: Pulmonary effort is normal.  Abdominal:     General: Abdomen is flat.     Palpations: Abdomen is soft.  Skin:    General: Skin is warm and dry.     Comments: Positive right transgleuteal drain to a suction bulb. Site is unremarkable with no erythema, edema, tenderness, bleeding or drainage. Suture and stat lock in place. Dressing is clean, dry, and intact. 3 ml of serosanguinous  fluid noted in the suction bulb. Drain flushes well.   Neurological:     Mental Status: She is alert.     Imaging: No results found.  Labs:  CBC: Recent Labs    11/08/20 0305 11/09/20 0043 11/10/20 0056 11/11/20 0032  WBC 3.3* 3.5* 3.7* 3.6*  HGB 7.3* 7.4* 7.9* 8.1*  HCT 23.7* 23.5* 25.3* 25.7*  PLT 198 178 189 211    COAGS: Recent Labs    09/29/20 1814 11/07/20 0125  INR 1.1 1.1  APTT 23*  --     BMP: Recent Labs    11/08/20 0305 11/09/20 1430 11/10/20 0056 11/11/20 0032  NA 136 136 136 135  K 4.4 4.5 4.0 4.1  CL 106 111 110 105  CO2 20* 19* 18* 23  GLUCOSE 84 80 116* 91  BUN 18 17 11 11   CALCIUM 8.5* 8.3* 8.4* 8.3*  CREATININE 2.54* 2.14* 1.86* 1.73*  GFRNONAA 24* 29* 35* 38*    LIVER FUNCTION TESTS: Recent Labs    09/29/20 1814 09/30/20 0555 10/13/20 1622 10/31/20 1126 11/05/20 1725  BILITOT 0.6 0.7 0.3 0.3 0.2*  AST 41 33 34 35* 50*  ALT 27 24 40 39* 54*  ALKPHOS 66 60 56  --  64  PROT 9.7* 9.0* 8.2* 7.2 7.5  ALBUMIN 3.6 3.2* 3.5  --  3.4*    Assessment and Plan:  40 year old female with history of right perirectal  abscess s/p percutaneous drainage catheter placement with IR on 10/07/2020 which was subsequently removed on 5/6 after follow CT which showed resolution of the perirectal abscess.  Presented to ED 5/25 with recurrent pain secondary to reaccumulation of perirectal abscess, s/p transgluteal drain placement with Dr. Serafina Royals on 5/27.   Patient stable Drain flushes/aspirates well  OP 3 cc  VSS CBC stable   Discussed with Dr. Laurence Ferrari, drain to be remain for now  as the drain has been placed for only two days and patient has been getting recurrent perirectal abscess.   Continue with flushing TID, output recording q shift and dressing changes as needed. Would consider additional imaging when output is less than 10 ml for 24 hours not including flush material.   Further treatment plan per Nephrology/CCS/ Appreciate and agree with the plan.  IR to follow.    Electronically Signed: Tera Mater, PA-C 11/12/2020, 10:54 AM   I spent a total of 15 Minutes at the the patient's bedside AND on the patient's hospital floor or unit, greater than 50% of which was counseling/coordinating care for transgluteal drain

## 2020-11-13 ENCOUNTER — Inpatient Hospital Stay (HOSPITAL_COMMUNITY): Payer: Medicaid Other

## 2020-11-13 ENCOUNTER — Encounter: Payer: Self-pay | Admitting: Radiology

## 2020-11-13 LAB — CBC WITH DIFFERENTIAL/PLATELET
Abs Immature Granulocytes: 0 10*3/uL (ref 0.00–0.07)
Basophils Absolute: 0.1 10*3/uL (ref 0.0–0.1)
Basophils Relative: 2 %
Eosinophils Absolute: 0.4 10*3/uL (ref 0.0–0.5)
Eosinophils Relative: 9 %
HCT: 25.9 % — ABNORMAL LOW (ref 36.0–46.0)
Hemoglobin: 8.1 g/dL — ABNORMAL LOW (ref 12.0–15.0)
Lymphocytes Relative: 31 %
Lymphs Abs: 1.2 10*3/uL (ref 0.7–4.0)
MCH: 24.6 pg — ABNORMAL LOW (ref 26.0–34.0)
MCHC: 31.3 g/dL (ref 30.0–36.0)
MCV: 78.7 fL — ABNORMAL LOW (ref 80.0–100.0)
Monocytes Absolute: 0.3 10*3/uL (ref 0.1–1.0)
Monocytes Relative: 7 %
Neutro Abs: 2 10*3/uL (ref 1.7–7.7)
Neutrophils Relative %: 51 %
Platelets: 212 10*3/uL (ref 150–400)
RBC: 3.29 MIL/uL — ABNORMAL LOW (ref 3.87–5.11)
WBC: 4 10*3/uL (ref 4.0–10.5)
nRBC: 0 % (ref 0.0–0.2)
nRBC: 0 /100 WBC

## 2020-11-13 LAB — MAGNESIUM: Magnesium: 1.5 mg/dL — ABNORMAL LOW (ref 1.7–2.4)

## 2020-11-13 LAB — COMPREHENSIVE METABOLIC PANEL
ALT: 47 U/L — ABNORMAL HIGH (ref 0–44)
AST: 38 U/L (ref 15–41)
Albumin: 3.1 g/dL — ABNORMAL LOW (ref 3.5–5.0)
Alkaline Phosphatase: 59 U/L (ref 38–126)
Anion gap: 6 (ref 5–15)
BUN: 17 mg/dL (ref 6–20)
CO2: 24 mmol/L (ref 22–32)
Calcium: 8.5 mg/dL — ABNORMAL LOW (ref 8.9–10.3)
Chloride: 103 mmol/L (ref 98–111)
Creatinine, Ser: 2.01 mg/dL — ABNORMAL HIGH (ref 0.44–1.00)
GFR, Estimated: 32 mL/min — ABNORMAL LOW (ref >60)
Glucose, Bld: 98 mg/dL (ref 70–99)
Potassium: 4.2 mmol/L (ref 3.5–5.1)
Sodium: 133 mmol/L — ABNORMAL LOW (ref 135–145)
Total Bilirubin: 0.6 mg/dL (ref 0.3–1.2)
Total Protein: 7.1 g/dL (ref 6.5–8.1)

## 2020-11-13 LAB — ACID FAST SMEAR (AFB, MYCOBACTERIA): Acid Fast Smear: NEGATIVE

## 2020-11-13 LAB — PHOSPHORUS: Phosphorus: 4.2 mg/dL (ref 2.5–4.6)

## 2020-11-13 MED ORDER — LEVETIRACETAM 100 MG/ML PO SOLN: 750.0000 mg | Freq: Two times a day (BID) | ORAL | 0 refills | Status: DC

## 2020-11-13 MED ORDER — MAGNESIUM OXIDE -MG SUPPLEMENT 400 (240 MG) MG PO TABS
400.0000 mg | ORAL_TABLET | Freq: Two times a day (BID) | ORAL | Status: DC
  Administered 2020-11-13 – 2020-11-15 (×5): 400 mg via ORAL
  Filled 2020-11-13 (×5): qty 1

## 2020-11-13 MED ORDER — MAGNESIUM SULFATE 2 GM/50ML IV SOLN
2.0000 g | Freq: Once | INTRAVENOUS | Status: DC
  Filled 2020-11-13: qty 50

## 2020-11-13 NOTE — TOC Initial Note (Signed)
Transition of Care [facility]) - Initial/Assessment Note    Patient Details  Name: Candace Jones MRN: [id number] Date of Birth: May 04, 1981  [facility] CM/SW Contact:    Pollie Friar, RN Phone Number: 11/13/2020, 2:20 PM  Clinical Narrative:                 CM met with the patient and her daughter using the interpretor services. Pt states she and daughter live together. She has no insurance or PCP. She is active with ID clinic. Pt is interested in getting set up with PCP. Cm was able to get an appointment at [facility] with information on the AVS.  Pt is open to [facility] services if needed at d/c. This would have to be arranged under charity services.  TOC consulted for medication assistance. Pt and daughter deny any issues with affording medications. [facility] uses [facility] as her pharmacy. TOC following.  Expected Discharge Plan: [place] Barriers to Discharge: Continued Medical Work up   Patient Goals and CMS Choice        Expected Discharge Plan and Services Expected Discharge Plan: [place]   Discharge Planning Services: CM Consult Post Acute Care Choice: [place] arrangements for the past 2 months: Apartment                                      Prior Living Arrangements/Services Living arrangements for the past 2 months: Apartment Lives with:: Adult Children Patient language and need for interpreter reviewed:: Yes ([place]) Do you feel safe going back to the place where you live?: Yes      Need for Family Participation in Patient Care: Yes (Comment) Care giver support system in place?: Yes (comment)   Criminal Activity/Legal Involvement Pertinent to Current Situation/Hospitalization: No - Comment as needed  Activities of Daily Living      Permission Sought/Granted                  Emotional Assessment Appearance:: Appears stated age Attitude/Demeanor/Rapport: Engaged Affect (typically  observed): Accepting Orientation: : Oriented to Self,Oriented to Place,Oriented to  Time,Oriented to Situation Alcohol / Substance Use: Not Applicable Psych Involvement: No (comment)  Admission diagnosis:  Hyperkalemia [E87.5] AKI (acute kidney injury) ([place]) [N17.9] Pelvic abscess in female [N73.9] Patient Active Problem List   Diagnosis Date Noted  . Pelvic abscess in female   . Heartburn   . Episodic paroxysmal hemicrania, not intractable   . Blurry vision   . Obstructive uropathy   . AKI (acute kidney injury) ([place]) 11/05/2020  . Hyperkalemia 11/05/2020  . Nausea & vomiting 10/17/2020  . MRI contraindicated due to metal implant   . 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 AIDS (acquired immune deficiency syndrome) ([place])   . Uterine mass   . Perirectal abscess   . Seizure ([place]) 09/29/2020  . Microcytic hypochromic anemia 09/29/2020   PCP:  Patient, No Pcp Per (Inactive) Pharmacy:   [id number] [place], [place] [address] [place] [address] Phone: [phone] Fax: [phone]  [facility] [address] [place] [address] Phone: [phone] Fax: [phone]  [facility] [place] [place]  [place] [address] Phone: [phone] Fax: [phone]  [facility] [address], [place] [address] Phone: [phone] Fax: [phone]     Social Determinants of Health (SDOH) Interventions    Readmission Risk Interventions No flowsheet data found.

## 2020-11-13 NOTE — Progress Notes (Signed)
ID Pharmacist Note   I spent time with Ms. Wirt and her sister this afternoon refilling her pillboxes with her new medications. I also spoke with her about these medication changes and instructed her to no longer take sulfadiazine or Biktarvy.   - I removed all sulfadiazine from her pillboxes and replaced it with clindamycin 600 mg every 6 hours  - I removed all Biktarvy from her pillboxes and replaced it with Dovato daily.    These medication changes were explained using a [place] interpreter. I also communicated with our clinic pharmacist who will assist Ms. Recine  with refilling her medications after she is discharged.   Jimmy Footman, PharmD, BCPS, BCIDP Infectious Diseases Clinical Pharmacist Phone: [phone] 11/13/2020 3:51 PM

## 2020-11-13 NOTE — Consult Note (Signed)
Urology Consult   Physician requesting consult: Fayrene Helper, MD  Reason for consult: Bilateral mild hydroureteronephrosis, AKI  History of Present Illness: Candace Jones is a 40 y.o. 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   The interview was conducted with the aid of a [place] interpreter.  Her creatinine on 10/31/2020 was found to be elevated at 5.87 from a baseline of 0.7.  CT abdomen pelvis 11/05/2020 demonstrated persistent and slightly increased mild to moderate hydroureteronephrosis to the level of the markedly enlarged uterus which is previously measured 16 x 9 x 11 cm on MRI of the pelvis on 10/02/2020.  That study demonstrated bulky enlargement of the uterus with numerous fibroids throughout the uterus as well as mild fullness of the collecting system.  Initially, nephrology was consulted and assumed that the AKI was due to tenofovir.  Her creatinine has down trended and is currently at 2.0 on 11/13/2020 which represents a rise from 1.7 on 11/11/2020.  Renal ultrasound 11/13/2020 demonstrated increased renal echogenicity compatible with medical renal disease as well as similar minor bilateral hydroureteronephrosis.  She denies any abdominal pain or flank pain.  She denies dysuria, gross hematuria, frequency or urgency.  She denies a history of voiding or storage urinary symptoms, hematuria, UTIs, STDs, urolithiasis, GU malignancy/trauma/surgery.  Past Medical History:  Diagnosis Date  . AIDS (acquired immune deficiency syndrome) ([place])   . 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 APPLICATION OF CRANIAL NAVIGATION Left 10/02/2020   Procedure: APPLICATION OF CRANIAL NAVIGATION;  Surgeon: Newman Pies, MD;  Location: [place];  Service: Neurosurgery;  Laterality: Left;  . CRANIOTOMY Left 10/02/2020   Procedure: LEFT CRANIOTOMY FOR TUMOR  BIOPSY/ RESECTION with Brain[facility];  Surgeon: Newman Pies, MD;  Location: [place];  Service: Neurosurgery;  Laterality: Left;  . IR RADIOLOGIST EVAL & MGMT  10/17/2020     Current Hospital Medications:  Home meds:  No current facility-administered medications on file prior to encounter.   Current Outpatient Medications on File Prior to Encounter  Medication Sig Dispense Refill  . bictegravir-emtricitabine-tenofovir AF (BIKTARVY) 50-200-25 MG TABS tablet Take 1 tablet by mouth daily. 30 tablet 5  . docusate sodium (COLACE) 100 MG capsule Take 1 capsule (100 mg total) by mouth 2 (two) times daily. 30 capsule 0  . ferrous sulfate 325 (65 FE) MG tablet Take 1 tablet (325 mg total) by mouth daily with breakfast. 90 tablet 0  . ibuprofen (ADVIL) 200 MG tablet Take 400 mg by mouth every 6 (six) hours as needed for headache or moderate pain.    Marland Kitchen levETIRAcetam (KEPPRA) 750 MG tablet Take 1 tablet (750 mg total) by mouth 2 (two) times daily. 60 tablet 2  . promethazine (PHENERGAN) 12.5 MG tablet Take 1 tablet (12.5 mg total) by mouth every 6 (six) hours as needed for nausea or vomiting. 60 tablet 1     Scheduled Meds: . clindamycin  600 mg Oral Q6H  . dapsone  100 mg Oral Daily  . docusate sodium  100 mg Oral BID  . dolutegravir  50 mg Oral Daily  . ferrous sulfate  325 mg Oral Q breakfast  . lamiVUDine  300 mg Oral Daily  . leucovorin  25 mg Oral Daily  . levETIRAcetam  750 mg Oral BID  . magnesium oxide  400 mg Oral BID  . pantoprazole  40 mg Oral  QHS  . pyrimethamine  75 mg Oral Q breakfast  . sodium chloride flush  5 mL Intracatheter Q8H   Continuous Infusions: . magnesium sulfate bolus IVPB     PRN Meds:.acetaminophen **OR** acetaminophen, calcium carbonate, phenol, promethazine  Allergies: No Known Allergies  Family History  Family history unknown: Yes    Social History:  reports that she has never smoked. She has never used smokeless tobacco. She reports that she does not  drink alcohol and does not use drugs.  ROS: A complete review of systems was performed.  All systems are negative except for pertinent findings as noted.  Physical Exam:  Vital signs in last 24 hours: Temp:  [97.9 F (36.6 C)-98.1 F (36.7 C)] 98 F (36.7 C) (06/02 1221) Pulse Rate:  [73-77] 77 (06/02 1221) Resp:  [17-18] 17 (06/02 0426) BP: (102-126)/(71-88) 126/84 (06/02 1221) SpO2:  [98 %-100 %] 100 % (06/02 1221) Constitutional:  Alert and oriented, No acute distress Cardiovascular: Regular rate and rhythm Respiratory: Normal respiratory effort, Lungs clear bilaterally GI: Abdomen is soft, nontender, nondistended, no abdominal masses GU: No CVA tenderness Neurologic: Grossly intact, no focal deficits Psychiatric: Normal mood and affect  Laboratory Data:  Recent Labs    11/11/20 0032 11/13/20 0101  WBC 3.6* 4.0  HGB 8.1* 8.1*  HCT 25.7* 25.9*  PLT 211 212    Recent Labs    11/11/20 0032 11/13/20 0101  NA 135 133*  K 4.1 4.2  CL 105 103  GLUCOSE 91 98  BUN 11 17  CALCIUM 8.3* 8.5*  CREATININE 1.73* 2.01*     Results for orders placed or performed during the hospital encounter of 11/05/20 (from the past 24 hour(s))  CBC with Differential/Platelet     Status: Abnormal   Collection Time: 11/13/20  1:01 AM  Result Value Ref Range   WBC 4.0 4.0 - 10.5 K/uL   RBC 3.29 (L) 3.87 - 5.11 MIL/uL   Hemoglobin 8.1 (L) 12.0 - 15.0 g/dL   HCT 25.9 (L) 36.0 - 46.0 %   MCV 78.7 (L) 80.0 - 100.0 fL   MCH 24.6 (L) 26.0 - 34.0 pg   MCHC 31.3 30.0 - 36.0 g/dL   RDW Not Measured 11.5 - 15.5 %   Platelets 212 150 - 400 K/uL   nRBC 0.0 0.0 - 0.2 %   Neutrophils Relative % 51 %   Neutro Abs 2.0 1.7 - 7.7 K/uL   Lymphocytes Relative 31 %   Lymphs Abs 1.2 0.7 - 4.0 K/uL   Monocytes Relative 7 %   Monocytes Absolute 0.3 0.1 - 1.0 K/uL   Eosinophils Relative 9 %   Eosinophils Absolute 0.4 0.0 - 0.5 K/uL   Basophils Relative 2 %   Basophils Absolute 0.1 0.0 - 0.1 K/uL    nRBC 0 0 /100 WBC   Abs Immature Granulocytes 0.00 0.00 - 0.07 K/uL   Tear Drop Cells PRESENT    Polychromasia MARKED    Ovalocytes PRESENT   Comprehensive metabolic panel     Status: Abnormal   Collection Time: 11/13/20  1:01 AM  Result Value Ref Range   Sodium 133 (L) 135 - 145 mmol/L   Potassium 4.2 3.5 - 5.1 mmol/L   Chloride 103 98 - 111 mmol/L   CO2 24 22 - 32 mmol/L   Glucose, Bld 98 70 - 99 mg/dL   BUN 17 6 - 20 mg/dL   Creatinine, Ser 2.01 (H) 0.44 - 1.00 mg/dL   Calcium 8.5 (L)  8.9 - 10.3 mg/dL   Total Protein 7.1 6.5 - 8.1 g/dL   Albumin 3.1 (L) 3.5 - 5.0 g/dL   AST 38 15 - 41 U/L   ALT 47 (H) 0 - 44 U/L   Alkaline Phosphatase 59 38 - 126 U/L   Total Bilirubin 0.6 0.3 - 1.2 mg/dL   GFR, Estimated 32 (L) >60 mL/min   Anion gap 6 5 - 15  Magnesium     Status: Abnormal   Collection Time: 11/13/20  1:01 AM  Result Value Ref Range   Magnesium 1.5 (L) 1.7 - 2.4 mg/dL  Phosphorus     Status: None   Collection Time: 11/13/20  1:01 AM  Result Value Ref Range   Phosphorus 4.2 2.5 - 4.6 mg/dL   Recent Results (from the past 240 hour(s))  Urine culture     Status: Abnormal   Collection Time: 11/05/20 11:03 PM   Specimen: Urine, Random  Result Value Ref Range Status   Specimen Description URINE, RANDOM  Final   Special Requests NONE  Final   Culture (A)  Final    <10,000 COLONIES/mL INSIGNIFICANT GROWTH Performed at [facility], [address] [place], [place] [address]    Report Status 11/07/2020 FINAL  Final  SARS CORONAVIRUS 2 (TAT 6-24 HRS) Nasopharyngeal Nasopharyngeal Swab     Status: None   Collection Time: 11/06/20 12:14 AM   Specimen: Nasopharyngeal Swab  Result Value Ref Range Status   SARS Coronavirus 2 NEGATIVE NEGATIVE Final    Comment: (NOTE) SARS-CoV-2 target nucleic acids are NOT DETECTED.  The SARS-CoV-2 RNA is generally detectable in upper and lower respiratory specimens during the acute phase of infection. Negative results do not  preclude SARS-CoV-2 infection, do not rule out co-infections with other pathogens, and should not be used as the sole basis for treatment or other patient management decisions. Negative results must be combined with clinical observations, patient history, and epidemiological information. The expected result is Negative.  Fact Sheet for Patients: SugarRoll.be  Fact Sheet for Healthcare Providers: https://www.woods-mathews.com/  This test is not yet approved or cleared by the [place] FDA and  has been authorized for detection and/or diagnosis of SARS-CoV-2 by FDA under an Emergency Use Authorization (EUA). This EUA will remain  in effect (meaning this test can be used) for the duration of the COVID-19 declaration under Se ction 564(b)(1) of the Act, 21 U.S.C. section 360bbb-3(b)(1), unless the authorization is terminated or revoked sooner.  Performed at [facility], [place] [address] [place], [place] [address]   Aerobic/Anaerobic Culture w Gram Stain (surgical/deep wound)     Status: None   Collection Time: 11/07/20 10:10 AM   Specimen: Abscess  Result Value Ref Range Status   Specimen Description ABSCESS  Final   Special Requests PELVIC RIGHT  Final   Gram Stain   Final    ABUNDANT WBC PRESENT, PREDOMINANTLY PMN NO ORGANISMS SEEN    Culture   Final    No growth aerobically or anaerobically. Performed at [facility], [place] [address], [place], [place] [address]    Report Status 11/12/2020 FINAL  Final  Acid Fast Smear (AFB)     Status: None   Collection Time: 11/07/20 10:10 AM   Specimen: Perirectal; Abscess  Result Value Ref Range Status   AFB Specimen Processing Concentration  Final   Acid Fast Smear Negative  Final    Comment: (NOTE) Performed At: [facility] [address]  [place], [place] [id number] Rush Farmer MD [id number]    Source (AFB) PELVIC RIGHT  Final    Comment: Performed at [facility], [place] [address] [place], [place] [address]  Acid Fast Smear (AFB)     Status: None   Collection Time: 11/11/20  7:28 PM   Specimen: Vein; Blood  Result Value Ref Range Status   AFB Specimen Processing Direct Inoculation  Final   Acid Fast Smear Negative  Final    Comment: (NOTE) Performed At: [facility] [place], [place] [id number] Rush Farmer MD IW:[id number]     Renal Function: Recent Labs    11/07/20 0125 11/08/20 0305 11/09/20 1430 11/10/20 0056 11/11/20 0032 11/13/20 0101  CREATININE 3.42* 2.54* 2.14* 1.86* 1.73* 2.01*   Estimated Creatinine Clearance: 37.5 mL/min (A) (by C-G formula based on SCr of 2.01 mg/dL (H)).  Radiologic Imaging: US RENAL  Result Date: 11/13/2020 CLINICAL DATA:  HIV, hydronephrosis EXAM: RENAL / URINARY TRACT ULTRASOUND COMPLETE COMPARISON:  11/05/2020 FINDINGS: Right Kidney: Renal measurements: 12.9 x 5.7 x 7.0 cm = volume: 264 mL. Increased renal echogenicity with preserved cortical thickness. Similar very mild hydronephrosis. Left Kidney: Renal measurements: 13.6 x 6.8 x 6.8 cm = volume: 328 mL. Increased renal echogenicity. Preserved normal cortical thickness. Very minor hydronephrosis. Bladder: Within normal limits for degree of distension. Enlarged uterus presumed related to fibroids. Other: No free fluid. IMPRESSION: Increased renal echogenicity compatible with medical renal disease. Similar minor bilateral hydronephrosis, possibly related to mass effect from the large fibroid uterus. Electronically Signed   By: Jerilynn Mages.  Shick M.D.   On: 11/13/2020 11:39   DG CHEST PORT 1 VIEW  Result Date: 11/12/2020 CLINICAL DATA:  Shortness of breath EXAM: PORTABLE CHEST 1 VIEW COMPARISON:  CT 09/29/2020 FINDINGS: Borderline to mild cardiomegaly. No focal opacity or pleural effusion. No pneumothorax. IMPRESSION: No active disease.  Borderline cardiomegaly Electronically Signed   By: Donavan Foil M.D.   On: 11/12/2020 19:27     I independently reviewed the above imaging studies.  Impression/Recommendation: 1. Mild bilateral hydroureteronephrosis: This is seen on multiple imaging studies including MRI of the pelvis on 10/02/2020, CT A/P 11/05/2020 and most recently renal ultrasound 11/13/2020.  This is due to mass-effect from markedly enlarged uterus measuring up to 16 x 9 x 11 cm. 2. AKI: Initially presented renal failure with creatinine over 5 which is now down trended and is presently 2.01 11/13/2020 3. AIDS 4. History of perirectal abscess  -I discussed with the patient that her mild bilateral hydroureteronephrosis is due to her markedly enlarged uterus.  We discussed that hysterectomy would be the best option for long-term management of her hydroureteronephrosis. -We did discuss that her hydroureteronephrosis is mild and there is certainly not an urgent indication for decompression.  Her creatinine has been trending down and has reached a plateau around 2 over the past couple of days.  We discussed that her AKI was presumed to be due to ATN from tenofovir and there is evidence of medical renal disease on most recent ultrasound.  However, in the presence of persistent hydroureteronephrosis with elevated renal function, it would be prudent to remove any obstruction and the best option for doing so was a hysterectomy. -We did discuss short-term options of indwelling ureteral stent placement which is fraught with failure in setting of mass-effect.  Studies show that these indwelling stents fail over 50% of the time with intrinsic mass compression.  We also discussed other short-term options including percutaneous nephrostomy  tube placement.  We discussed that this would require exchanges every 3 months and does not have a long-term option for her. -Ultimately, I think she is best suited with a hysterectomy.  We can consider short-term bilateral percutaneous nephrostomy tubes pending timing of this procedure however I think her  hydronephrosis will resolve after her uterus is removed. -Following peripherally  Matt R. Shaiann Mcmanamon MD 11/13/2020, 5:43 PM  [facility]  Pager: [phone]   CC: Fayrene Helper, MD

## 2020-11-13 NOTE — Progress Notes (Signed)
Referring Physician(s): Candace Danker PA-C   Supervising Physician: Candace Jones  Patient Status:  Candace Jones - In-pt  Chief Complaint:  S/p right transgluteal drain placement with Dr. Serafina Jones on 5/27   Subjective:  Pt laying in bed, not in acute distress.  States no pain around the right buttocks area.  Getting discharged today.    Allergies: Patient has no known allergies.  Medications: Prior to Admission medications   Medication Sig Start Date End Date Taking? Authorizing Provider  bictegravir-emtricitabine-tenofovir AF (BIKTARVY) 50-200-25 MG TABS tablet Take 1 tablet by mouth daily. 10/17/20  Yes Mulat Callas, NP  docusate sodium (COLACE) 100 MG capsule Take 1 capsule (100 mg total) by mouth 2 (two) times daily. 10/10/20  Yes Bonnielee Haff, MD  ferrous sulfate 325 (65 FE) MG tablet Take 1 tablet (325 mg total) by mouth daily with breakfast. 10/10/20 01/08/21 Yes [place] Donnamarie Poag, DO  ibuprofen (ADVIL) 200 MG tablet Take 400 mg by mouth every 6 (six) hours as needed for headache or moderate pain.   Yes [provider]  levETIRAcetam (KEPPRA) 750 MG tablet Take 1 tablet (750 mg total) by mouth 2 (two) times daily. 10/31/20 01/29/21 Yes [employer] Callas, NP  promethazine (PHENERGAN) 12.5 MG tablet Take 1 tablet (12.5 mg total) by mouth every 6 (six) hours as needed for nausea or vomiting. 10/31/20  Yes [place]upp[place]weiser, Cassie L, RPH-CPP  sulfaDIAZINE 500 MG tablet Take 3 tablets (1,500 mg total) by mouth 4 (four) times daily for 12 days. 11/07/20 11/19/20 Yes [place] Callas, NP  leucovorin (WELLCOVORIN) 25 MG tablet Take 1 tablet (25 mg total) by mouth daily. 11/06/20   Kuppelweiser, Cassie L, RPH-CPP  omeprazole (PRILOSEC) 20 MG capsule Take 1 capsule (20 mg total) by mouth daily. 11/06/20   Kuppelweiser, Cassie L, RPH-CPP  pyrimethamine (DARAPRIM) 25 MG tablet Take 3 tablets (75 mg total) by mouth daily with breakfast. 11/07/20   Kuppelweiser, Cassie L, RPH-CPP     Vital  Signs: BP 102/71 (BP Location: Left Arm)   Pulse 73   Temp 98.1 F (36.7 C) (Oral)   Resp 17   Ht 5\' 8"  (1.727 m)   Wt 163 lb 12.8 oz (74.3 kg)   SpO2 100%   BMI 24.91 kg/m   Physical Exam Constitutional:      General: She is not in acute distress.    Appearance: Normal appearance. She is not ill-appearing.  HENT:     Head: Normocephalic and atraumatic.  Cardiovascular:     Rate and Rhythm: Normal rate.  Pulmonary:     Effort: Pulmonary effort is normal.  Abdominal:     General: Abdomen is flat.     Palpations: Abdomen is soft.  Skin:    General: Skin is warm and dry.     Comments: Positive right transgleuteal drain to a suction bulb.  Neurological:     Mental Status: She is alert.     Imaging: DG CHEST PORT 1 VIEW  Result Date: 11/12/2020 CLINICAL DATA:  Shortness of breath EXAM: PORTABLE CHEST 1 VIEW COMPARISON:  CT 09/29/2020 FINDINGS: Borderline to mild cardiomegaly. No focal opacity or pleural effusion. No pneumothorax. IMPRESSION: No active disease.  Borderline cardiomegaly Electronically Signed   By: Donavan Foil M.D.   On: 11/12/2020 19:27    Labs:  CBC: Recent Labs    11/09/20 0043 11/10/20 0056 11/11/20 0032 11/13/20 0101  WBC 3.5* 3.7* 3.6* 4.0  HGB 7.4* 7.9* 8.1* 8.1*  HCT 23.5* 25.3*  25.7* 25.9*  PLT 178 189 211 212    COAGS: Recent Labs    09/29/20 1814 11/07/20 0125  INR 1.1 1.1  APTT 23*  --     BMP: Recent Labs    11/09/20 1430 11/10/20 0056 11/11/20 0032 11/13/20 0101  NA 136 136 135 133*  K 4.5 4.0 4.1 4.2  CL 111 110 105 103  CO2 19* 18* 23 24  GLUCOSE 80 116* 91 98  BUN 17 11 11 17   CALCIUM 8.3* 8.4* 8.3* 8.5*  CREATININE 2.14* 1.86* 1.73* 2.01*  GFRNONAA 29* 35* 38* 32*    LIVER FUNCTION TESTS: Recent Labs    09/30/20 0555 10/13/20 1622 10/31/20 1126 11/05/20 1725 11/13/20 0101  BILITOT 0.7 0.3 0.3 0.2* 0.6  AST 33 34 35* 50* 38  ALT 24 40 39* 54* 47*  ALKPHOS 60 56  --  64 59  PROT 9.0* 8.2* 7.2 7.5  7.1  ALBUMIN 3.2* 3.5  --  3.4* 3.1*    Assessment and Plan:  40 year old female with history of right perirectal abscess s/p percutaneous drainage catheter placement with IR on 10/07/2020 which was subsequently removed on 5/6 after follow CT which showed resolution of the perirectal abscess.  Presented to ED 5/25 with recurrent pain secondary to reaccumulation of perirectal abscess, s/p transgluteal drain placement with Dr. Serafina Jones on 5/27.   Patient stable Drain intact per pt. OP 10 cc  VSS CBC stable   If patient were to be disharged before the drain can be removed in the [facility], our clinic will call patient to set up a follow up appointment for CT scan and possible drain injection at the clinic 1-2 weeks after the discharge.  Out patient follow up orders in.    Out patient discharge instruction for the drain and dressing:    ? Patient to flush the drain with 5 cc normal saline daily ? Record output daily (subtract 5 cc that was used to flush the drain from the total daily output)  ? Dressing change as needed and at least once a week.    Further treatment plan per Nephrology/CCS/ Appreciate and agree with the plan.  Please call IR for questions and concerns regarding the transgluteal drain.     Electronically Signed: Tera Mater, PA-C 11/13/2020, 9:50 AM   I spent a total of 15 Minutes at the the patient's bedside AND on the patient's Jones floor or unit, greater than 50% of which was counseling/coordinating care for transgluteal drain

## 2020-11-13 NOTE — Plan of Care (Signed)
  Problem: Activity: Goal: Risk for activity intolerance will decrease Outcome: Progressing   Problem: Nutrition: Goal: Adequate nutrition will be maintained Outcome: Progressing   Problem: Coping: Goal: Level of anxiety will decrease Outcome: Progressing   

## 2020-11-13 NOTE — Progress Notes (Signed)
PROGRESS NOTE    Candace Jones  RSW:[id number] DOB: [birth year] DOA: 11/05/2020 PCP: Patient, No Pcp Per (Inactive)   No chief complaint on file.   Brief Narrative:  Candace Jones is Candace Jones 40 y.o. female history of AIDS, toxoplasma meningoencephalitis, recent perirectal abscess. 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 She has been taking ibuprofen to help with headaches. CT imaging with evidence of mild to moderate hydroureteronephrosis at least to the level of the markedly enlarged uterus, possibly 2/2 mass effect.  - worsening renal function today - urology consult, appreciate assistance - renal consult, appreciate recs -> repeat UA, UPC, UACR - hold tenofovir - gyn consult, appreciate recs - renal US with minor bilateral hydro, possibly related to mass effect  Gross hematuria Seems this may have been vaginal bleeding rather than hematuria. Hematuria has resolved with resolution of vaginal bleeding. Discussed with urology who recommended outpatient urology resident clinic visit.  Acute blood loss anemia Acute on chronic anemia Patient with chronic anemia.  Previous iron panel from April 2022 significant for iron deficiency anemia.  Patient also with recent gross hematuria which likely contributed to acute component.  Secondary to uterine fibroids and resultant heavy menstrual bleeding. Baseline hemoglobin of about 8-9. Hemoglobin down to 7.3 and stable.   Reticulocyte count is uncharacteristically low, so hematology consulted. LDH minimally elevated at 204. Haptoglobin normal. -Serial CBC, transfuse as needed for hemoglobin less than 7 -Hematology recommendations: MMA (pending), erythropoietin (elevated), homocysteine (wnl), parvovirus B19 serology (positiv IgG, negative IgM)/PCR pending - needs referral to heme clinic outpatient if Hbg fails to improve with appropriate treatment  Perirectal abscess Previously treated with percutaneous drain placement and antibiotics in April 2022.  Complicated by AIDS diagnosis. -General surgery recommendations: recommendation for drain injection prior to removal -IR recommendations: follow-up at drain clinic 10-14 days s/p discharge for CT/drain injection  Out patient discharge instruction for the drain and dressing:   ? Patient to flush the drain with 5 cc normal saline daily ? Record output daily (subtract 5 cc that was used to flush the drain from the total daily output)  ? Dressing change as needed and at least once Candace Jones week.  AIDS Newly diagnosed one year prior. Undetectable CD4 count.  Viral load of 57,200 copies per milliliter prior to admission; 100 copies per milliliter on 5/26.  Patient is on [place] as an outpatient which has been held secondary to severe renal impairment. -Infectious disease recommendations: dolutegravir and lamivudine -> plan for d/c on dovato, dapsone for PCP prevention  Uterine fibroids Previously diagnosed as fibroids vs leiomyosarcoma. Gynecology consulted at previous hospitalization who recommended outpatient follow-up and per assessment, unlikely leiomyosarcoma.  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 Started on sulfadiazine and pyrimethamine which were continued on admission -Infectious disease recommendations: added clindamycin and discontinued  sulfadiazine  Seizures Per ID note, difficulty tolerating keppra, will need follow up with neurology - will discuss with ID and  clarify  Shortness of breath Resolved today CXR wnl   DVT prophylaxis:SCD Code Status: full  Family Communication: none at bedside Disposition:   Status is: Inpatient  Remains inpatient appropriate because:Inpatient level of care appropriate due to severity of illness   Dispo: The patient is from: Home              Anticipated d/c is to: Home              Patient currently is not medically stable to d/c.   Difficult to place patient No       Consultants:   ID  Nephrology  Surgery  IR  oncology  Procedures:   Right pelvic drain lacement 5/27  Antimicrobials:  Anti-infectives (From admission, onward)   Start     Dose/Rate Route Frequency Ordered Stop   11/12/20 0000  dapsone 100 MG tablet  Status:  Discontinued        100 mg Oral Daily 11/12/20 0850 11/12/20    11/12/20 0000  Dolutegravir-lamiVUDine (DOVATO) 50-300 MG TABS  Status:  Discontinued        1 tablet Oral Daily 11/12/20 0850 11/12/20    11/12/20 0000  dapsone 100 MG tablet        100 mg Oral Daily 11/12/20 1216 02/10/21 2359   11/12/20 0000  Dolutegravir-lamiVUDine (DOVATO) 50-300 MG TABS        1 tablet Oral Daily 11/12/20 1216     11/10/20 1000  dapsone tablet 100 mg        100 mg Oral Daily 11/10/20 0912     11/10/20 1000  azithromycin (ZITHROMAX) tablet 1,200 mg  Status:  Discontinued        1,200 mg Oral Weekly 11/10/20 0912 11/11/20 0852   11/07/20 1400  sulfaDIAZINE tablet 1,500 mg  Status:  Discontinued        1,500 mg Oral 4 times daily 11/07/20 1032 11/07/20 1254   11/07/20 1400  clindamycin (CLEOCIN) capsule 600 mg        600 mg Oral Every 6 hours 11/07/20 1254     11/07/20 0800  pyrimethamine (DARAPRIM) tablet 75 mg  Status:  Discontinued        75 mg Oral Daily with breakfast 11/06/20 0043 11/06/20 2011   11/06/20 2200  sulfaDIAZINE tablet 1,500 mg         1,500 mg Oral 4 times daily 11/06/20 1448 11/07/20 1006   11/06/20 2100  pyrimethamine (DARAPRIM) tablet 75 mg        75 mg Oral Daily with breakfast 11/06/20 2011     11/06/20 2000  dolutegravir (TIVICAY) tablet 50 mg        50 mg Oral Daily 11/06/20 1754     11/06/20 2000  lamiVUDine (EPIVIR) tablet 300 mg       Note to Pharmacy: Do NOT adjust dose based on renal fxn   300 mg Oral Daily 11/06/20 1754     11/06/20 1800  sulfaDIAZINE tablet 1,500 mg  Status:  Discontinued        1,500 mg Oral  Once 11/06/20 1321 11/07/20 0947   11/06/20 1030  sulfaDIAZINE tablet 1,500 mg        1,500 mg Oral  Once 11/06/20 1027 11/06/20 1156   11/06/20 1000  sulfaDIAZINE tablet 1,500 mg  Status:  Discontinued        1,500 mg Oral 4 times daily 11/06/20 0043 11/07/20 1032         Subjective: No complaints  today  Objective: Vitals:   11/12/20 1441 11/12/20 2041 11/13/20 0426 11/13/20 1221  BP: 117/87 124/88 102/71 126/84  Pulse: 73 77 73 77  Resp: 17 18 17    Temp: 98.6 F (37 C) 97.9 F (36.6 C) 98.1 F (36.7 C) 98 F (36.7 C)  TempSrc: Oral Oral Oral Oral  SpO2: 99% 98% 100% 100%  Weight:      Height:        Intake/Output Summary (Last 24 hours) at 11/13/2020 1703 Last data filed at 11/13/2020 1000 Gross per 24 hour  Intake --  Output 10 ml  Net -10 ml   Filed Weights   11/09/20 2100  Weight: 74.3 kg    Examination:  General: No acute distress. Cardiovascular: RRR Lungs: unlabored Abdomen: Soft, nontender, nondistended Neurological: Alert and oriented 3. Moves all extremities 4. Cranial nerves II through XII grossly intact. Skin: Warm and dry. No rashes or lesions. Extremities: No clubbing or cyanosis. No edema   Data Reviewed: I have personally reviewed following labs and imaging studies  CBC: Recent Labs  Lab 11/08/20 0305 11/09/20 0043 11/10/20 0056 11/11/20 0032 11/13/20 0101  WBC 3.3* 3.5* 3.7* 3.6* 4.0  NEUTROABS  --   --   --   --  2.0  HGB 7.3* 7.4*  7.9* 8.1* 8.1*  HCT 23.7* 23.5* 25.3* 25.7* 25.9*  MCV 75.2* 76.8* 78.3* 77.9* 78.7*  PLT 198 178 189 211 834    Basic Metabolic Panel: Recent Labs  Lab 11/08/20 0305 11/09/20 1430 11/10/20 0056 11/11/20 0032 11/13/20 0101  NA 136 136 136 135 133*  K 4.4 4.5 4.0 4.1 4.2  CL 106 111 110 105 103  CO2 20* 19* 18* 23 24  GLUCOSE 84 80 116* 91 98  BUN 18 17 11 11 17   CREATININE 2.54* 2.14* 1.86* 1.73* 2.01*  CALCIUM 8.5* 8.3* 8.4* 8.3* 8.5*  MG  --   --   --   --  1.5*  PHOS  --   --   --   --  4.2    GFR: Estimated Creatinine Clearance: 37.5 mL/min (Candace Jones) (by C-G formula based on SCr of 2.01 mg/dL (H)).  Liver Function Tests: Recent Labs  Lab 11/13/20 0101  AST 38  ALT 47*  ALKPHOS 59  BILITOT 0.6  PROT 7.1  ALBUMIN 3.1*    CBG: No results for input(s): GLUCAP in the last 168 hours.   Recent Results (from the past 240 hour(s))  Urine culture     Status: Abnormal   Collection Time: 11/05/20 11:03 PM   Specimen: Urine, Random  Result Value Ref Range Status   Specimen Description URINE, RANDOM  Final   Special Requests NONE  Final   Culture (Candace Jones)  Final    <10,000 COLONIES/mL INSIGNIFICANT GROWTH Performed at [facility], [address], [place], [place] [address]    Report Status 11/07/2020 FINAL  Final  SARS CORONAVIRUS 2 (TAT 6-24 HRS) Nasopharyngeal Nasopharyngeal Swab     Status: None   Collection Time: 11/06/20 12:14 AM   Specimen: Nasopharyngeal Swab  Result Value Ref Range Status   SARS Coronavirus 2 NEGATIVE NEGATIVE Final    Comment: (NOTE) SARS-CoV-2 target nucleic acids are NOT DETECTED.  The SARS-CoV-2 RNA is generally detectable in upper and lower respiratory specimens during the acute phase of infection. Negative results do not preclude SARS-CoV-2 infection, do not rule out co-infections with other pathogens, and should not be used as the sole basis for treatment or  other patient management decisions. Negative results must be combined  with clinical observations, patient history, and epidemiological information. The expected result is Negative.  Fact Sheet for Patients: SugarRoll.be  Fact Sheet for Healthcare Providers: https://www.woods-mathews.com/  This test is not yet approved or cleared by the [place] FDA and  has been authorized for detection and/or diagnosis of SARS-CoV-2 by FDA under an Emergency Use Authorization (EUA). This EUA will remain  in effect (meaning this test can be used) for the duration of the COVID-19 declaration under Se ction 564(b)(1) of the Act, 21 U.S.C. section 360bbb-3(b)(1), unless the authorization is terminated or revoked sooner.  Performed at [facility], [place] [address] [place], [place] [address]   Aerobic/Anaerobic Culture w Gram Stain (surgical/deep wound)     Status: None   Collection Time: 11/07/20 10:10 AM   Specimen: Abscess  Result Value Ref Range Status   Specimen Description ABSCESS  Final   Special Requests PELVIC RIGHT  Final   Gram Stain   Final    ABUNDANT WBC PRESENT, PREDOMINANTLY PMN NO ORGANISMS SEEN    Culture   Final    No growth aerobically or anaerobically. Performed at [facility], [place] [address], [place], [place] [address]    Report Status 11/12/2020 FINAL  Final  Acid Fast Smear (AFB)     Status: None   Collection Time: 11/07/20 10:10 AM   Specimen: Perirectal; Abscess  Result Value Ref Range Status   AFB Specimen Processing Concentration  Final   Acid Fast Smear Negative  Final    Comment: (NOTE) Performed At: [facility] [place], [place] [id number] Rush Farmer MD YB:[id number]    Source (AFB) PELVIC RIGHT  Final    Comment: Performed at [facility], [place] [address] [place], [place] [address]  Acid Fast Smear (AFB)     Status: None   Collection Time: 11/11/20  7:28 PM   Specimen: Vein; Blood  Result Value Ref Range Status   AFB Specimen  Processing Direct Inoculation  Final   Acid Fast Smear Negative  Final    Comment: (NOTE) Performed At: [facility] [place], [place] [id number] Rush Farmer MD RX:[id number]          Radiology Studies: US RENAL  Result Date: 11/13/2020 CLINICAL DATA:  HIV, hydronephrosis EXAM: RENAL / URINARY TRACT ULTRASOUND COMPLETE COMPARISON:  11/05/2020 FINDINGS: Right Kidney: Renal measurements: 12.9 x 5.7 x 7.0 cm = volume: 264 mL. Increased renal echogenicity with preserved cortical thickness. Similar very mild hydronephrosis. Left Kidney: Renal measurements: 13.6 x 6.8 x 6.8 cm = volume: 328 mL. Increased renal echogenicity. Preserved normal cortical thickness. Very minor hydronephrosis. Bladder: Within normal limits for degree of distension. Enlarged uterus presumed related to fibroids. Other: No free fluid. IMPRESSION: Increased renal echogenicity compatible with medical renal disease. Similar minor bilateral hydronephrosis, possibly related to mass effect from the large fibroid uterus. Electronically Signed   By: Jerilynn Mages.  Shick M.D.   On: 11/13/2020 11:39   DG CHEST PORT 1 VIEW  Result Date: 11/12/2020 CLINICAL DATA:  Shortness of breath EXAM: PORTABLE CHEST 1 VIEW COMPARISON:  CT 09/29/2020 FINDINGS: Borderline to mild cardiomegaly. No focal opacity or pleural effusion. No pneumothorax. IMPRESSION: No active disease.  Borderline cardiomegaly Electronically Signed   By: Donavan Foil M.D.   On: 11/12/2020 19:27        Scheduled Meds: . clindamycin  600 mg Oral Q6H  . dapsone  100 mg Oral Daily  . docusate sodium  100 mg Oral BID  . dolutegravir  50 mg Oral Daily  . ferrous sulfate  325 mg Oral Q breakfast  . lamiVUDine  300 mg Oral Daily  . leucovorin  25 mg Oral Daily  . levETIRAcetam  750 mg Oral BID  . magnesium oxide  400 mg Oral BID  . pantoprazole  40 mg Oral QHS  . pyrimethamine  75 mg Oral Q breakfast  . sodium chloride flush  5 mL Intracatheter Q8H    Continuous Infusions: . magnesium sulfate bolus IVPB       LOS: 7 days    Time spent: over 30 min    Fayrene Helper, MD [facility]   To contact the attending provider between 7A-7P or the covering provider during after hours 7P-7A, please log into the web site [email or web address] and access using universal [place] password for that web site. If you do not have the password, please call the hospital operator.  11/13/2020, 5:03 PM

## 2020-11-13 NOTE — Consult Note (Addendum)
OBSTETRICS AND GYNECOLOGY ATTENDING CONSULT NOTE  Consult Date: 11/13/2020  Reason for Consult: Uterine fibroids causing AKI/hydronephrosis Consulting Provider: Dr. Janyth Pupa    Assessment/Plan: 1)Uterine fibroids -clinically she does not report HMB or significant dysmenorrhea -Based on exam, enlarged uterus noted.  Whether this is leading to the AKI and hydronephrosis is unclear.  It is likely the fibroids have been present for more than 1-2 mos and at prior admission, the kidney function was within normal limits.  Additionally, pt did have improvement of Cr since admission without any treatment of uterine fibroids -If Cr function does not continue to improve may revisit management of uterine fibroids, which would ultimately be a total abdominal hysterectomy.  Options to reduce the bulkiness of the uterus, such as [place] Lupron would not likely have an impactful change in a short time frame. -Care reviewed with medicine- consult to urology has also occurred and we will continue to follow at this time  Please call [phone] [facility] OB/GYN Consult Attending Monday-Friday 8am - 5pm) or [phone] [facility] OB/GYN Attending On Call all day, every day) for any gynecologic concerns at any time.  Thank you for involving [place] in the care of this patient.  [place] interpreter used to obtain information.    Janyth Pupa, DO Attending [place], [facility] for [facility], [facility]   History of Present Illness: Candace Jones is an 40 y.o. No obstetric history on file. female who was admitted for acute kidney injury and perirectal abscess  Pertinent OB/GYN History: OBhx: FTNSVD x 2, uncomplicated  GYNhx: LMP- last month- menses typically last 3-5 days.  Denies HMB or intermenstrual bleeding.  Of note, she did note 2 days of heavier bleeding when she first started "the medicine" from last time- she could not tell me which medication that was.   Denies dysmenorrhea. Pt does not have a PCP and does not report any concerns/problems regarding her period.  Prior pap not completed   Patient Active Problem List   Diagnosis Date Noted  . Pelvic abscess in female   . Heartburn   . Episodic paroxysmal hemicrania, not intractable   . Blurry vision   . Obstructive uropathy   . AKI (acute kidney injury) ([place]) 11/05/2020  . Hyperkalemia 11/05/2020  . Nausea & vomiting 10/17/2020  . MRI contraindicated due to metal implant   . 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 AIDS (acquired immune deficiency syndrome) ([place])   . Uterine mass   . Perirectal abscess   . Seizure ([place]) 09/29/2020  . Microcytic hypochromic anemia 09/29/2020    Past Medical History:  Diagnosis Date  . AIDS (acquired immune deficiency syndrome) ([place])   . 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 APPLICATION OF CRANIAL NAVIGATION Left 10/02/2020   Procedure: APPLICATION OF CRANIAL NAVIGATION;  Surgeon: Newman Pies, MD;  Location: [place];  Service: Neurosurgery;  Laterality: Left;  . CRANIOTOMY Left 10/02/2020   Procedure: LEFT CRANIOTOMY FOR TUMOR BIOPSY/ RESECTION with BrainLab;  Surgeon: Newman Pies, MD;  Location: [place];  Service: Neurosurgery;  Laterality: Left;    Family History  Family history unknown: Yes    Social History:  reports that she has never smoked. She has never used smokeless tobacco. She reports that she does not drink alcohol and does not use drugs.  Allergies: No Known Allergies  Medications: I have reviewed the patient's current medications.  Review of Systems: Constitutional: negative for  chills, fatigue, fevers and night sweats Eyes: negative for visual disturbance Respiratory: negative for SOB Cardiovascular: negative for chest pain and dyspnea Gastrointestinal: negative for change in bowel habits, constipation and diarrhea Urinary:negative for urinary frequency,  urgency, incontinence.   GU: denies abnormal discharge, itching or irritation Integument/breast: negative for breast lump and breast tenderness Musculoskeletal:negative for muscle weakness Neurological: negative for dizziness, headaches and weakness Behavioral/Psych: negative for mood changes or sleep disturbances  Focused Physical Examination: BP 126/84 (BP Location: Right Arm)   Pulse 77   Temp 98 F (36.7 C) (Oral)   Resp 17   Ht 5\' 8"  (1.727 m)   Wt 74.3 kg   SpO2 100%   BMI 24.91 kg/m  CONSTITUTIONAL: Well-developed, well-nourished female in no acute distress.  EYES: Conjunctivae and EOM are normal. Pupils are equal, round, and reactive to light. No scleral icterus.  NECK: Supple, no masses SKIN: Skin is warm and dry. No rash noted. Not diaphoretic NEUROLGIC: Alert and oriented to person, place, and time. PSYCHIATRIC: Normal mood and affect. Normal behavior. Normal judgment and thought content. CARDIOVASCULAR: Normal heart rate noted, regular rhythm RESPIRATORY: Clear to auscultation bilaterally. Effort and breath sounds normal, no problems with respiration noted. ABDOMEN: Soft, normal bowel sounds, no distention noted.  Enlarged uterus noted well below umbilicus. PELVIC: Normal appearing external genitalia; normal appearing vaginal mucosa.  On bimanual exam- firm large uterus noted- non-tender.  Adnexa not palpated due to enlarged uterus. MUSCULOSKELETAL: No calf tenderness, no edema noted  Labs and Imaging: Results for orders placed or performed during the hospital encounter of 11/05/20 (from the past 72 hour(s))  CBC     Status: Abnormal   Collection Time: 11/11/20 12:32 AM  Result Value Ref Range   WBC 3.6 (L) 4.0 - 10.5 K/uL   RBC 3.30 (L) 3.87 - 5.11 MIL/uL   Hemoglobin 8.1 (L) 12.0 - 15.0 g/dL    Comment: Reticulocyte Hemoglobin testing may be clinically indicated, consider ordering this additional test [id number]    HCT 25.7 (L) 36.0 - 46.0 %   MCV 77.9 (L) 80.0  - 100.0 fL   MCH 24.5 (L) 26.0 - 34.0 pg   MCHC 31.5 30.0 - 36.0 g/dL   RDW Not Measured 11.5 - 15.5 %   Platelets 211 150 - 400 K/uL    Comment: REPEATED TO VERIFY   nRBC 0.0 0.0 - 0.2 %    Comment: Performed at [facility], [place] [address], [place], [place] [address]  Basic metabolic panel     Status: Abnormal   Collection Time: 11/11/20 12:32 AM  Result Value Ref Range   Sodium 135 135 - 145 mmol/L   Potassium 4.1 3.5 - 5.1 mmol/L   Chloride 105 98 - 111 mmol/L   CO2 23 22 - 32 mmol/L   Glucose, Bld 91 70 - 99 mg/dL    Comment: Glucose reference range applies only to samples taken after fasting for at least 8 hours.   BUN 11 6 - 20 mg/dL   Creatinine, Ser 1.73 (H) 0.44 - 1.00 mg/dL   Calcium 8.3 (L) 8.9 - 10.3 mg/dL   GFR, Estimated 38 (L) >60 mL/min    Comment: (NOTE) Calculated using the CKD-EPI Creatinine Equation (2021)    Anion gap 7 5 - 15    Comment: Performed at [facility] [address], [place], [place] [address]  Acid Fast Smear (AFB)     Status: None   Collection Time: 11/11/20  7:28  PM   Specimen: Vein; Blood  Result Value Ref Range   AFB Specimen Processing Direct Inoculation    Acid Fast Smear Negative     Comment: (NOTE) Performed At: [facility] [place], [place] [id number] Rush Farmer MD PZ:[id number]   CBC with Differential/Platelet     Status: Abnormal   Collection Time: 11/13/20  1:01 AM  Result Value Ref Range   WBC 4.0 4.0 - 10.5 K/uL   RBC 3.29 (L) 3.87 - 5.11 MIL/uL   Hemoglobin 8.1 (L) 12.0 - 15.0 g/dL   HCT 25.9 (L) 36.0 - 46.0 %   MCV 78.7 (L) 80.0 - 100.0 fL   MCH 24.6 (L) 26.0 - 34.0 pg   MCHC 31.3 30.0 - 36.0 g/dL   RDW Not Measured 11.5 - 15.5 %   Platelets 212 150 - 400 K/uL    Comment: REPEATED TO VERIFY   nRBC 0.0 0.0 - 0.2 %   Neutrophils Relative % 51 %   Neutro Abs 2.0 1.7 - 7.7 K/uL   Lymphocytes Relative 31 %   Lymphs Abs 1.2 0.7 - 4.0 K/uL   Monocytes Relative 7 %   Monocytes  Absolute 0.3 0.1 - 1.0 K/uL   Eosinophils Relative 9 %   Eosinophils Absolute 0.4 0.0 - 0.5 K/uL   Basophils Relative 2 %   Basophils Absolute 0.1 0.0 - 0.1 K/uL   nRBC 0 0 /100 WBC   Abs Immature Granulocytes 0.00 0.00 - 0.07 K/uL   Tear Drop Cells PRESENT    Polychromasia MARKED    Ovalocytes PRESENT     Comment: Performed at [facility], [place] [address] [place], [place] [address]  Comprehensive metabolic panel     Status: Abnormal   Collection Time: 11/13/20  1:01 AM  Result Value Ref Range   Sodium 133 (L) 135 - 145 mmol/L   Potassium 4.2 3.5 - 5.1 mmol/L   Chloride 103 98 - 111 mmol/L   CO2 24 22 - 32 mmol/L   Glucose, Bld 98 70 - 99 mg/dL    Comment: Glucose reference range applies only to samples taken after fasting for at least 8 hours.   BUN 17 6 - 20 mg/dL   Creatinine, Ser 2.01 (H) 0.44 - 1.00 mg/dL   Calcium 8.5 (L) 8.9 - 10.3 mg/dL   Total Protein 7.1 6.5 - 8.1 g/dL   Albumin 3.1 (L) 3.5 - 5.0 g/dL   AST 38 15 - 41 U/L   ALT 47 (H) 0 - 44 U/L   Alkaline Phosphatase 59 38 - 126 U/L   Total Bilirubin 0.6 0.3 - 1.2 mg/dL   GFR, Estimated 32 (L) >60 mL/min    Comment: (NOTE) Calculated using the CKD-EPI Creatinine Equation (2021)    Anion gap 6 5 - 15    Comment: Performed at [facility], [place] [address], [place], [place] [address]  Magnesium     Status: Abnormal   Collection Time: 11/13/20  1:01 AM  Result Value Ref Range   Magnesium 1.5 (L) 1.7 - 2.4 mg/dL    Comment: Performed at [facility] [address] [place], [place] [address]  Phosphorus     Status: None   Collection Time: 11/13/20  1:01 AM  Result Value Ref Range   Phosphorus 4.2 2.5 - 4.6 mg/dL    Comment: Performed at [facility] [address], [place], [place] [address]   11/05/20: Abd/Pelvic CT:  IMPRESSION: 1. Evaluation is complicated by an absence of intravenous contrast media, extensive respiratory motion artifact and a paucity of intraperitoneal fat, all of  which may contribute to limited detection of subtle abnormalities. 2. Reaccumulation of the thick-walled collection along the right pelvic floor following removal of a drainage catheter seen on comparison CT imaging concerning for recurrent perirectal abscess. 3. Markedly enlarged, heterogeneous uterus with multiple dural mass is suspicious for uterine fibroids though underlying leiomyosarcoma is not fully excluded. Findings are better detailed on comparison MR imaging.  4. Persistent and slightly increased mild to moderate hydroureteronephrosis at least to the level of the markedly enlarged uterus, possibly secondary to mass effect. Can certainly result in some diminished renal function. Increased attenuation dependently within the collecting systems could reflect excreted contrast media versus milk of calcium or urolithiasis with motion artifact. 5. Hypoattenuation of the cardiac blood pool, often reflective of anemia. 6. Trace pericardial effusion.  Prior imaging 10/02/20- MRI:  Reproductive: Bulky enlargement of the uterus. Numerous uterine masses. Endometrium not assessed. Adjacent urinary bladder is collapsed due to mass effect. Distal RIGHT ureter is mildly dilated as is the distal LEFT ureter due to mass effect.  Uterus measuring 16 x 9 x 11 cm.  Heterogeneous mass in the lower uterine segment is the largest mass in the uterus measuring 10 x 10 x 9.6 cm. Other small masses with predominantly low T2 signal and variable enhancement are noted. The dominant uterine mass displays enhancement from baseline signal with heterogeneous pattern.

## 2020-11-13 NOTE — Progress Notes (Signed)
Subjective:  She is c/o what sounds like dysphagia with keppra even when she cuts in half along with sensation of difficulty breathing for a brief time after taking this medication   Antibiotics:  Anti-infectives (From admission, onward)   Start     Dose/Rate Route Frequency Ordered Stop   11/12/20 0000  dapsone 100 MG tablet  Status:  Discontinued        100 mg Oral Daily 11/12/20 0850 11/12/20    11/12/20 0000  Dolutegravir-lamiVUDine (DOVATO) 50-300 MG TABS  Status:  Discontinued        1 tablet Oral Daily 11/12/20 0850 11/12/20    11/12/20 0000  dapsone 100 MG tablet        100 mg Oral Daily 11/12/20 1216 02/10/21 2359   11/12/20 0000  Dolutegravir-lamiVUDine (DOVATO) 50-300 MG TABS        1 tablet Oral Daily 11/12/20 1216     11/10/20 1000  dapsone tablet 100 mg        100 mg Oral Daily 11/10/20 0912     11/10/20 1000  azithromycin (ZITHROMAX) tablet 1,200 mg  Status:  Discontinued        1,200 mg Oral Weekly 11/10/20 0912 11/11/20 0852   11/07/20 1400  sulfaDIAZINE tablet 1,500 mg  Status:  Discontinued        1,500 mg Oral 4 times daily 11/07/20 1032 11/07/20 1254   11/07/20 1400  clindamycin (CLEOCIN) capsule 600 mg        600 mg Oral Every 6 hours 11/07/20 1254     11/07/20 0800  pyrimethamine (DARAPRIM) tablet 75 mg  Status:  Discontinued        75 mg Oral Daily with breakfast 11/06/20 0043 11/06/20 2011   11/06/20 2200  sulfaDIAZINE tablet 1,500 mg        1,500 mg Oral 4 times daily 11/06/20 1448 11/07/20 1006   11/06/20 2100  pyrimethamine (DARAPRIM) tablet 75 mg        75 mg Oral Daily with breakfast 11/06/20 2011     11/06/20 2000  dolutegravir (TIVICAY) tablet 50 mg        50 mg Oral Daily 11/06/20 1754     11/06/20 2000  lamiVUDine (EPIVIR) tablet 300 mg       Note to Pharmacy: Do NOT adjust dose based on renal fxn   300 mg Oral Daily 11/06/20 1754     11/06/20 1800  sulfaDIAZINE tablet 1,500 mg  Status:  Discontinued        1,500 mg Oral  Once  11/06/20 1321 11/07/20 0947   11/06/20 1030  sulfaDIAZINE tablet 1,500 mg        1,500 mg Oral  Once 11/06/20 1027 11/06/20 1156   11/06/20 1000  sulfaDIAZINE tablet 1,500 mg  Status:  Discontinued        1,500 mg Oral 4 times daily 11/06/20 0043 11/07/20 1032      Medications: Scheduled Meds: . clindamycin  600 mg Oral Q6H  . dapsone  100 mg Oral Daily  . docusate sodium  100 mg Oral BID  . dolutegravir  50 mg Oral Daily  . ferrous sulfate  325 mg Oral Q breakfast  . lamiVUDine  300 mg Oral Daily  . leucovorin  25 mg Oral Daily  . levETIRAcetam  750 mg Oral BID  . magnesium oxide  400 mg Oral BID  . pantoprazole  40 mg Oral QHS  . pyrimethamine  75  mg Oral Q breakfast  . sodium chloride flush  5 mL Intracatheter Q8H   Continuous Infusions: . magnesium sulfate bolus IVPB     PRN Meds:.acetaminophen **OR** acetaminophen, calcium carbonate, phenol, promethazine    Objective: Weight change:   Intake/Output Summary (Last 24 hours) at 11/13/2020 1250 Last data filed at 11/13/2020 1000 Gross per 24 hour  Intake 5 ml  Output 10 ml  Net -5 ml   Blood pressure 126/84, pulse 77, temperature 98 F (36.7 C), temperature source Oral, resp. rate 17, height $RemoveBe'5\' 8"'jnPcYRhTY$  (1.727 m), weight 74.3 kg, SpO2 100 %. Temp:  [97.9 F (36.6 C)-98.6 F (37 C)] 98 F (36.7 C) (06/02 1221) Pulse Rate:  [73-77] 77 (06/02 1221) Resp:  [17-18] 17 (06/02 0426) BP: (102-126)/(71-88) 126/84 (06/02 1221) SpO2:  [98 %-100 %] 100 % (06/02 1221)  Physical Exam: Physical Exam Constitutional:      General: She is not in acute distress.    Appearance: She is well-developed. She is not diaphoretic.  HENT:     Head: Normocephalic and atraumatic.     Right Ear: External ear normal.     Left Ear: External ear normal.     Mouth/Throat:     Pharynx: No oropharyngeal exudate.  Eyes:     General: No scleral icterus.    Extraocular Movements: Extraocular movements intact.     Conjunctiva/sclera: Conjunctivae  normal.  Cardiovascular:     Rate and Rhythm: Normal rate and regular rhythm.  Pulmonary:     Effort: Pulmonary effort is normal. No respiratory distress.     Breath sounds: No wheezing.  Abdominal:     General: Bowel sounds are normal. There is no distension.     Palpations: Abdomen is soft.     Tenderness: There is no abdominal tenderness. There is no rebound.  Musculoskeletal:        General: No tenderness. Normal range of motion.  Lymphadenopathy:     Cervical: No cervical adenopathy.  Skin:    General: Skin is warm and dry.     Coloration: Skin is not pale.     Findings: No erythema or rash.  Neurological:     General: No focal deficit present.     Mental Status: She is alert and oriented to person, place, and time.     Motor: No abnormal muscle tone.     Coordination: Coordination normal.  Psychiatric:        Mood and Affect: Mood normal.        Behavior: Behavior normal.        Thought Content: Thought content normal.        Judgment: Judgment normal.   She has drain in place she shows me at the bedside CBC:    BMET Recent Labs    11/11/20 0032 11/13/20 0101  NA 135 133*  K 4.1 4.2  CL 105 103  CO2 23 24  GLUCOSE 91 98  BUN 11 17  CREATININE 1.73* 2.01*  CALCIUM 8.3* 8.5*     Liver Panel  Recent Labs    11/13/20 0101  PROT 7.1  ALBUMIN 3.1*  AST 38  ALT 47*  ALKPHOS 59  BILITOT 0.6       Sedimentation Rate No results for input(s): ESRSEDRATE in the last 72 hours. C-Reactive Protein No results for input(s): CRP in the last 72 hours.  Micro Results: Recent Results (from the past 720 hour(s))  Urine culture     Status: Abnormal   Collection  Time: 11/05/20 11:03 PM   Specimen: Urine, Random  Result Value Ref Range Status   Specimen Description URINE, RANDOM  Final   Special Requests NONE  Final   Culture (A)  Final    <10,000 COLONIES/mL INSIGNIFICANT GROWTH Performed at [facility], [address], [place], [place]  [address]    Report Status 11/07/2020 FINAL  Final  SARS CORONAVIRUS 2 (TAT 6-24 HRS) Nasopharyngeal Nasopharyngeal Swab     Status: None   Collection Time: 11/06/20 12:14 AM   Specimen: Nasopharyngeal Swab  Result Value Ref Range Status   SARS Coronavirus 2 NEGATIVE NEGATIVE Final    Comment: (NOTE) SARS-CoV-2 target nucleic acids are NOT DETECTED.  The SARS-CoV-2 RNA is generally detectable in upper and lower respiratory specimens during the acute phase of infection. Negative results do not preclude SARS-CoV-2 infection, do not rule out co-infections with other pathogens, and should not be used as the sole basis for treatment or other patient management decisions. Negative results must be combined with clinical observations, patient history, and epidemiological information. The expected result is Negative.  Fact Sheet for Patients: SugarRoll.be  Fact Sheet for Healthcare Providers: https://www.woods-mathews.com/  This test is not yet approved or cleared by the [place] FDA and  has been authorized for detection and/or diagnosis of SARS-CoV-2 by FDA under an Emergency Use Authorization (EUA). This EUA will remain  in effect (meaning this test can be used) for the duration of the COVID-19 declaration under Se ction 564(b)(1) of the Act, 21 U.S.C. section 360bbb-3(b)(1), unless the authorization is terminated or revoked sooner.  Performed at [facility], [place] [address] [place], [place] [address]   Aerobic/Anaerobic Culture w Gram Stain (surgical/deep wound)     Status: None   Collection Time: 11/07/20 10:10 AM   Specimen: Abscess  Result Value Ref Range Status   Specimen Description ABSCESS  Final   Special Requests PELVIC RIGHT  Final   Gram Stain   Final    ABUNDANT WBC PRESENT, PREDOMINANTLY PMN NO ORGANISMS SEEN    Culture   Final    No growth aerobically or anaerobically. Performed at [facility], [place] [address], [place], [place] [address]    Report Status 11/12/2020 FINAL  Final  Acid Fast Smear (AFB)     Status: None   Collection Time: 11/07/20 10:10 AM   Specimen: Perirectal; Abscess  Result Value Ref Range Status   AFB Specimen Processing Concentration  Final   Acid Fast Smear Negative  Final    Comment: (NOTE) Performed At: [facility] [place], [place] [id number] Rush Farmer MD RK:[id number]    Source (AFB) PELVIC RIGHT  Final    Comment: Performed at [facility], [place] [address] [place], [place] [address]  Acid Fast Smear (AFB)     Status: None   Collection Time: 11/11/20  7:28 PM   Specimen: Vein; Blood  Result Value Ref Range Status   AFB Specimen Processing Direct Inoculation  Final   Acid Fast Smear Negative  Final    Comment: (NOTE) Performed At: [facility] [place], [place] [id number] Rush Farmer MD GY:[id number]     Studies/Results: US RENAL  Result Date: 11/13/2020 CLINICAL DATA:  HIV, hydronephrosis EXAM: RENAL / URINARY TRACT ULTRASOUND COMPLETE COMPARISON:  11/05/2020 FINDINGS: Right Kidney: Renal measurements: 12.9 x 5.7 x 7.0 cm = volume: 264 mL. Increased renal echogenicity with preserved cortical thickness. Similar very mild hydronephrosis.  Left Kidney: Renal measurements: 13.6 x 6.8 x 6.8 cm = volume: 328 mL. Increased renal echogenicity. Preserved normal cortical thickness. Very minor hydronephrosis. Bladder: Within normal limits for degree of distension. Enlarged uterus presumed related to fibroids. Other: No free fluid. IMPRESSION: Increased renal echogenicity compatible with medical renal disease. Similar minor bilateral hydronephrosis, possibly related to mass effect from the large fibroid uterus. Electronically Signed   By: Jerilynn Mages.  Shick M.D.   On: 11/13/2020 11:39   DG CHEST PORT 1 VIEW  Result Date: 11/12/2020 CLINICAL DATA:  Shortness of breath EXAM: PORTABLE CHEST 1 VIEW COMPARISON:  CT 09/29/2020  FINDINGS: Borderline to mild cardiomegaly. No focal opacity or pleural effusion. No pneumothorax. IMPRESSION: No active disease.  Borderline cardiomegaly Electronically Signed   By: Donavan Foil M.D.   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  Is also complaining of headaches and blurry vision we saw her last week.  1.  Acute renal failure: I continue to believe that this was more likely precipitated by obstructive pathology.  We have also though removed tenofovir thrown her regimen.  My understanding is that Urology will be coming by to evaluate her again today.  2.  HIV disease: Her HIV regimen will be single tablet Dovato going forward we do not yet have this on the hospital formulary so Jimmy Footman, Pharm D has procured them and will fill her pillboxes with it before leaves  She is on dapsone for PCP prevention    #3 Enlarged uterus possible fibroids versus leiomyosarcoma: It is interesting to me that her acute renal failure and sending obstructive pathology occurred in the context of heavy menstrual bleed bleeding.  I hope she can be seen gynecology in follow-up.  I do have anxiety that obstructive pathology will recur potentially compromise her kidney function once again.  #4 CNS toxoplasmosis: This is responded nicely to therapy we did exchange sulfadiazine to cover for  clindamycin given her GI upset  We will need to fill out her pillboxes again with clindamycin substituted for sulfadiazine prior to discharge  #5 blurry vision: She claims this is now resolved so I would not push for ophthalmology consult  #6 perirectal abscess: Cultures unrevealing so far she is only on clindamycin.  IR following as well have been surgery there is some concerned about a fistula potentially between bowel and area where the abscess was found  #7 Leukopenia and anemia: Likely multifactorial I discontinued her M AVM prophylaxis in case she ends up getting a bone marrow biopsy is that we might build to send for AFB stain and cultures  #8 Seizures: it turns out the keppra is drug she has difficulty tolerating  We are considering liquid formulation to see if this would help but I doubt this is on [facility] formulary  She needs followup with Neurology to determine if alternative drug appropriate PROVIDED IT DOES NOT INTERACT  with her ARV's and also ? How long she will need AED    [place] has an appointment on 11/20/2020 at 945 AM with Janene Madeira, NP  The [facility] is located in the [facility] at  [place] in [place].  [address], which is located to the left of the elevators.  Phone: [phone]  Fax: [phone]  [email or web address]  She should arrive 15 to 30 minutes prior to her appointment.  I spent more than 35 minutes with the patient including greater than 50% of time in face to face counseling of the patient personally reviewing radiographs, long with pertinent laboratory microbiological data review of medical records and in coordination of her care.    LOS: 7 days   Alcide Evener 11/13/2020, 12:50 PM

## 2020-11-13 NOTE — Progress Notes (Signed)
[facility] Progress Note  Name: Candace Jones MRN: [id number] DOB: Oct 08, 1980  Chief Complaint:  AKI  Subjective:  Signed off on 5/28. Crt improved down to 1.7 yesterday but slightly increased today to 2. Previous baseline was 0.7. RUS today continues to show mild hydronephrosis. OBGYN now following. UOP intermittently well documented.  Review of systems:   10 systems reviewed and negative except per HPI --------- Background on consult:  Candace Jones is a 40 y.o. 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  She had Cr 0.71 end of April as below then increase to 1.62 on 5/2 and to 5.87 on 5/20.  Labs here a little better but still with AKI.  She has also been using ibuprofen for headache and states taking BID.  Previously took [employer] as well.  Work-up here demonstrated an enlarged uterus with possible mass effect and slightly increased mild to moderate hydroureteronephrosis.  Also with concern for recurrent perirectal abscess.  Had hematuria but note she also reported being on her period; LMP last Sunday and her period ended on 5/25.    Intake/Output Summary (Last 24 hours) at 11/13/2020 1436 Last data filed at 11/13/2020 1000 Gross per 24 hour  Intake --  Output 10 ml  Net -10 ml    Vitals:  Vitals:   11/12/20 1441 11/12/20 2041 11/13/20 0426 11/13/20 1221  BP: 117/87 124/88 102/71 126/84  Pulse: 73 77 73 77  Resp: 17 18 17    Temp: 98.6 F (37 C) 97.9 F (36.6 C) 98.1 F (36.7 C) 98 F (36.7 C)  TempSrc: Oral Oral Oral Oral  SpO2: 99% 98% 100% 100%  Weight:      Height:         Physical Exam:  GEN: wdwn, sitting in bed, nad ENT: no nasal discharge, mmm EYES: no scleral icterus, eomi CV: normal rate, no murmurs PULM: no iwob, bilateral chest rise ABD: NABS, non-distended SKIN: no rashes or jaundice EXT: no edema, warm and well perfused  Medications reviewed    Labs:  BMP Latest Ref Rng & Units 11/13/2020 11/11/2020 11/10/2020  Glucose 70 - 99 mg/dL 98 91 116(H)  BUN 6 - 20 mg/dL 17 11 11   Creatinine 0.44 - 1.00 mg/dL 2.01(H) 1.73(H) 1.86(H)  BUN/Creat Ratio 6 - 22 (calc) - - -  Sodium 135 - 145 mmol/L 133(L) 135 136  Potassium 3.5 - 5.1 mmol/L 4.2 4.1 4.0  Chloride 98 - 111 mmol/L 103 105 110  CO2 22 - 32 mmol/L 24 23 18(L)  Calcium 8.9 - 10.3 mg/dL 8.5(L) 8.3(L) 8.4(L)     Assessment/Plan:   # AKI : felt to be 2/2 to tenofovir has improved with conservative mgmt from 6 -> 1.7 but now stalled improvement. Difficult to answer whether this is persistent damage from tenofovir or now hydro. I would favor tenofovir given the fibroids are likely long standing but hard to say for sure. Often resolution of tubular damage from tenofovir can take weeks to months so a stall in Crt is not fully unexpected. One way we may be able to separate tenofovir damage from hydro is to assess for poximal tubular damage; quick and easy way is to look for tubular perineuria (UACR < 50% of total Urine protein) as well as normo-glycemic glycosuria. These findings would support ongoing tubular dysfunction from tenofovir. -Repeat UA and obtain UPC and UACR -Consider surgical mgmt of fibroids/hydronephrosis if Crt continues to worsen  or laboratory findings do not support signs of ongoing tubular damage -Hold tenofovir -Appreciate Gyn help -Encourage oral hydration  # AIDS - regimen per ID - Would defer any compounds with tenofovir   # Mild to moderate bilateral hydroureteronephrosis  -Urgency of need for surgical intervention is questionable as above. Appreciate GYN help  # Anemia microcytic  - mgmt per primary. Stable at 8.1. 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  Reesa Chew, MD 11/13/2020 2:36 PM

## 2020-11-13 NOTE — Discharge Instructions (Signed)
IR Drain Care Instruction   Out patient discharge instruction for the drain and dressing:    ? Patient to flush the drain with 5 cc normal saline daily ? Record output daily (subtract 5 cc that was used to flush the drain from the total daily output)  ? Dressing change as needed and at least once a week.

## 2020-11-14 DIAGNOSIS — N1330 Unspecified hydronephrosis: Secondary | ICD-10-CM

## 2020-11-14 DIAGNOSIS — Q6211 Congenital occlusion of ureteropelvic junction: Secondary | ICD-10-CM

## 2020-11-14 LAB — COMPREHENSIVE METABOLIC PANEL
ALT: 41 U/L (ref 0–44)
AST: 33 U/L (ref 15–41)
Albumin: 3.1 g/dL — ABNORMAL LOW (ref 3.5–5.0)
Alkaline Phosphatase: 54 U/L (ref 38–126)
Anion gap: 8 (ref 5–15)
BUN: 16 mg/dL (ref 6–20)
CO2: 25 mmol/L (ref 22–32)
Calcium: 8.6 mg/dL — ABNORMAL LOW (ref 8.9–10.3)
Chloride: 103 mmol/L (ref 98–111)
Creatinine, Ser: 1.72 mg/dL — ABNORMAL HIGH (ref 0.44–1.00)
GFR, Estimated: 38 mL/min — ABNORMAL LOW (ref >60)
Glucose, Bld: 90 mg/dL (ref 70–99)
Potassium: 3.5 mmol/L (ref 3.5–5.1)
Sodium: 136 mmol/L (ref 135–145)
Total Bilirubin: 0.6 mg/dL (ref 0.3–1.2)
Total Protein: 7 g/dL (ref 6.5–8.1)

## 2020-11-14 LAB — CBC WITH DIFFERENTIAL/PLATELET
Abs Immature Granulocytes: 0 10*3/uL (ref 0.00–0.07)
Basophils Absolute: 0.1 10*3/uL (ref 0.0–0.1)
Basophils Relative: 2 %
Eosinophils Absolute: 0.1 10*3/uL (ref 0.0–0.5)
Eosinophils Relative: 4 %
HCT: 25.2 % — ABNORMAL LOW (ref 36.0–46.0)
Hemoglobin: 8 g/dL — ABNORMAL LOW (ref 12.0–15.0)
Lymphocytes Relative: 17 %
Lymphs Abs: 0.6 10*3/uL — ABNORMAL LOW (ref 0.7–4.0)
MCH: 24.8 pg — ABNORMAL LOW (ref 26.0–34.0)
MCHC: 31.7 g/dL (ref 30.0–36.0)
MCV: 78 fL — ABNORMAL LOW (ref 80.0–100.0)
Monocytes Absolute: 0.2 10*3/uL (ref 0.1–1.0)
Monocytes Relative: 7 %
Neutro Abs: 2.3 10*3/uL (ref 1.7–7.7)
Neutrophils Relative %: 70 %
Platelets: 203 10*3/uL (ref 150–400)
RBC: 3.23 MIL/uL — ABNORMAL LOW (ref 3.87–5.11)
WBC: 3.3 10*3/uL — ABNORMAL LOW (ref 4.0–10.5)
nRBC: 0 % (ref 0.0–0.2)
nRBC: 0 /100 WBC

## 2020-11-14 LAB — PHOSPHORUS: Phosphorus: 4.4 mg/dL (ref 2.5–4.6)

## 2020-11-14 LAB — HUMAN PARVOVIRUS DNA DETECTION BY PCR: Parvovirus B19, PCR: NEGATIVE

## 2020-11-14 LAB — MAGNESIUM: Magnesium: 1.6 mg/dL — ABNORMAL LOW (ref 1.7–2.4)

## 2020-11-14 NOTE — Progress Notes (Signed)
Patient ID: Candace Jones, female   DOB: 19-Jan-1981, 40 y.o.   MRN: [id number] [facility] Progress Note   Assessment/ Plan:   1. Acute kidney Injury: Nonoliguric.  Initial injury felt to be primarily from tenofovir associated renal injury and based on assessment from neurology, likely with a component of obstruction given persistent hydronephrosis in the setting of an enlarged uterus.  Management options outlined in Dr. Virgina Norfolk note from yesterday.  Overnight, she reports good urine output although this has not been charted accordingly and labs this morning show creatinine is now back down to 1.7.  I suspect hysterectomy may help with definitive management with percutaneous nephrostomy placement if creatinine starts climbing again.  She does not have any acute electrolyte abnormalities or indications for dialysis.  Discussed oral fluid intake 2.  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  Antiretroviral therapy per ID service.  Avoid tenofovir. 3.  Mild to moderate bilateral hydroureteronephrosis: Secondary to enlarged uterus from fibroid load.  Seen yesterday by urology and their notes reviewed.  Definitive management would be hysterectomy with percutaneous nephrostomy tubes indicated as a bridge to this if renal function starts to worsen again. 4.  Perirectal abscess: Status post percutaneous drainage placement by IR on 5/27 and will need home health for additional care.  Subjective:   Reports that she is feeling fair and inquires about when she would go home.   Objective:   BP 101/67 (BP Location: Left Arm)   Pulse 81   Temp 98.6 F (37 C) (Oral)   Resp 18   Ht 5\' 8"  (1.727 m)   Wt 74.3 kg   SpO2 100%   BMI 24.91 kg/m   Intake/Output Summary (Last 24 hours) at 11/14/2020 1126 Last data filed at 11/13/2020 2250 Gross per 24 hour  Intake --  Output 10 ml  Net -10 ml   Weight change:   Physical Exam: Gen: Comfortably sitting  up in chair, watching television CVS: Pulse regular rhythm, normal rate, S1 and S2 normal Resp: Clear to auscultation bilaterally, no distinct rales or rhonchi Abd: Soft, flat, nontender, bowel sounds normal Ext: No lower extremity edema  Imaging: US RENAL  Result Date: 11/13/2020 CLINICAL DATA:  HIV, hydronephrosis EXAM: RENAL / URINARY TRACT ULTRASOUND COMPLETE COMPARISON:  11/05/2020 FINDINGS: Right Kidney: Renal measurements: 12.9 x 5.7 x 7.0 cm = volume: 264 mL. Increased renal echogenicity with preserved cortical thickness. Similar very mild hydronephrosis. Left Kidney: Renal measurements: 13.6 x 6.8 x 6.8 cm = volume: 328 mL. Increased renal echogenicity. Preserved normal cortical thickness. Very minor hydronephrosis. Bladder: Within normal limits for degree of distension. Enlarged uterus presumed related to fibroids. Other: No free fluid. IMPRESSION: Increased renal echogenicity compatible with medical renal disease. Similar minor bilateral hydronephrosis, possibly related to mass effect from the large fibroid uterus. Electronically Signed   By: Jerilynn Mages.  Shick M.D.   On: 11/13/2020 11:39   DG CHEST PORT 1 VIEW  Result Date: 11/12/2020 CLINICAL DATA:  Shortness of breath EXAM: PORTABLE CHEST 1 VIEW COMPARISON:  CT 09/29/2020 FINDINGS: Borderline to mild cardiomegaly. No focal opacity or pleural effusion. No pneumothorax. IMPRESSION: No active disease.  Borderline cardiomegaly Electronically Signed   By: Donavan Foil M.D.   On: 11/12/2020 19:27    Labs: BMET Recent Labs  Lab 11/08/20 0305 11/09/20 1430 11/10/20 0056 11/11/20 0032 11/13/20 0101 11/14/20 0508  NA 136 136 136 135 133* 136  K 4.4 4.5 4.0 4.1 4.2 3.5  CL 106  111 110 105 103 103  CO2 20* 19* 18* 23 24 25   GLUCOSE 84 80 116* 91 98 90  BUN 18 17 11 11 17 16   CREATININE 2.54* 2.14* 1.86* 1.73* 2.01* 1.72*  CALCIUM 8.5* 8.3* 8.4* 8.3* 8.5* 8.6*  PHOS  --   --   --   --  4.2 4.4   CBC Recent Labs  Lab 11/10/20 0056  11/11/20 0032 11/13/20 0101 11/14/20 0508  WBC 3.7* 3.6* 4.0 3.3*  NEUTROABS  --   --  2.0 2.3  HGB 7.9* 8.1* 8.1* 8.0*  HCT 25.3* 25.7* 25.9* 25.2*  MCV 78.3* 77.9* 78.7* 78.0*  PLT 189 211 212 203    Medications:    . clindamycin  600 mg Oral Q6H  . dapsone  100 mg Oral Daily  . docusate sodium  100 mg Oral BID  . dolutegravir  50 mg Oral Daily  . ferrous sulfate  325 mg Oral Q breakfast  . lamiVUDine  300 mg Oral Daily  . leucovorin  25 mg Oral Daily  . levETIRAcetam  750 mg Oral BID  . magnesium oxide  400 mg Oral BID  . pantoprazole  40 mg Oral QHS  . pyrimethamine  75 mg Oral Q breakfast  . sodium chloride flush  5 mL Intracatheter Q8H   Elmarie Shiley, MD 11/14/2020, 11:26 AM

## 2020-11-14 NOTE — Progress Notes (Signed)
Referring Physician(s): Saverio Danker (CCS)  Supervising Physician: Arne Cleveland  Patient Status:  [facility] - In-pt  Chief Complaint:  Recurrent right perirectal abscess (had right TG drain placed in IR 10/07/2020; this was subsequently removed in IR 10/17/2020) s/p right TG drain placement in IR 11/07/2020.  Subjective:  Patient awake and alert sitting in chair watching TV with no complaints at this time. Right TG drain site c/d/i.   Allergies: Patient has no known allergies.  Medications: Prior to Admission medications   Medication Sig Start Date End Date Taking? Authorizing Provider  bictegravir-emtricitabine-tenofovir AF (BIKTARVY) 50-200-25 MG TABS tablet Take 1 tablet by mouth daily. 10/17/20  Yes Manila Callas, NP  docusate sodium (COLACE) 100 MG capsule Take 1 capsule (100 mg total) by mouth 2 (two) times daily. 10/10/20  Yes Bonnielee Haff, MD  ferrous sulfate 325 (65 FE) MG tablet Take 1 tablet (325 mg total) by mouth daily with breakfast. 10/10/20 01/08/21 Yes [place] Donnamarie Poag, DO  ibuprofen (ADVIL) 200 MG tablet Take 400 mg by mouth every 6 (six) hours as needed for headache or moderate pain.   Yes [provider]  levETIRAcetam (KEPPRA) 100 MG/ML solution Take 7.5 mLs (750 mg total) by mouth 2 (two) times daily. 11/13/20  Yes Elodia Florence., MD  levETIRAcetam (KEPPRA) 750 MG tablet Take 1 tablet (750 mg total) by mouth 2 (two) times daily. 10/31/20 01/29/21 Yes [place] Callas, NP  promethazine (PHENERGAN) 12.5 MG tablet Take 1 tablet (12.5 mg total) by mouth every 6 (six) hours as needed for nausea or vomiting. 10/31/20  Yes Kuppelweiser, Cassie L, RPH-CPP  clindamycin (CLEOCIN) 300 MG capsule Take 2 capsules (600 mg total) by mouth 4 (four) times daily. Accidentally sent to [facility] location. Will be picked up this evening. Thanks! 11/12/20   Kuppelweiser, Cassie L, RPH-CPP  dapsone 100 MG tablet Take 1 tablet (100 mg total) by mouth daily. 11/12/20 02/10/21   Elodia Florence., MD  Dolutegravir-lamiVUDine (DOVATO) 50-300 MG TABS Take 1 tablet by mouth daily. 11/12/20   Elodia Florence., MD  leucovorin (WELLCOVORIN) 25 MG tablet Take 1 tablet (25 mg total) by mouth daily. 11/06/20   Kuppelweiser, Cassie L, RPH-CPP  omeprazole (PRILOSEC) 20 MG capsule Take 1 capsule (20 mg total) by mouth daily. 11/06/20   Kuppelweiser, Cassie L, RPH-CPP  pyrimethamine (DARAPRIM) 25 MG tablet Take 3 tablets (75 mg total) by mouth daily with breakfast. 11/07/20   Kuppelweiser, Cassie L, RPH-CPP     Vital Signs: BP 101/67 (BP Location: Left Arm)   Pulse 81   Temp 98.6 F (37 C) (Oral)   Resp 18   Ht 5\' 8"  (1.727 m)   Wt 163 lb 12.8 oz (74.3 kg)   SpO2 100%   BMI 24.91 kg/m   Physical Exam Vitals and nursing note reviewed.  Constitutional:      General: She is not in acute distress. Pulmonary:     Effort: Pulmonary effort is normal. No respiratory distress.  Abdominal:     Comments: Right TG drain site without tenderness, erythema, active bleeding, or drainage; approximately 20 cc clear yellow fluid in suction bulb.  Skin:    General: Skin is warm and dry.  Neurological:     Mental Status: She is alert and oriented to person, place, and time.     Imaging: US RENAL  Result Date: 11/13/2020 CLINICAL DATA:  HIV, hydronephrosis EXAM: RENAL / URINARY TRACT ULTRASOUND COMPLETE COMPARISON:  11/05/2020 FINDINGS:  Right Kidney: Renal measurements: 12.9 x 5.7 x 7.0 cm = volume: 264 mL. Increased renal echogenicity with preserved cortical thickness. Similar very mild hydronephrosis. Left Kidney: Renal measurements: 13.6 x 6.8 x 6.8 cm = volume: 328 mL. Increased renal echogenicity. Preserved normal cortical thickness. Very minor hydronephrosis. Bladder: Within normal limits for degree of distension. Enlarged uterus presumed related to fibroids. Other: No free fluid. IMPRESSION: Increased renal echogenicity compatible with medical renal disease. Similar minor  bilateral hydronephrosis, possibly related to mass effect from the large fibroid uterus. Electronically Signed   By: Jerilynn Mages.  Shick M.D.   On: 11/13/2020 11:39   DG CHEST PORT 1 VIEW  Result Date: 11/12/2020 CLINICAL DATA:  Shortness of breath EXAM: PORTABLE CHEST 1 VIEW COMPARISON:  CT 09/29/2020 FINDINGS: Borderline to mild cardiomegaly. No focal opacity or pleural effusion. No pneumothorax. IMPRESSION: No active disease.  Borderline cardiomegaly Electronically Signed   By: Donavan Foil M.D.   On: 11/12/2020 19:27    Labs:  CBC: Recent Labs    11/10/20 0056 11/11/20 0032 11/13/20 0101 11/14/20 0508  WBC 3.7* 3.6* 4.0 3.3*  HGB 7.9* 8.1* 8.1* 8.0*  HCT 25.3* 25.7* 25.9* 25.2*  PLT 189 211 212 203    COAGS: Recent Labs    09/29/20 1814 11/07/20 0125  INR 1.1 1.1  APTT 23*  --     BMP: Recent Labs    11/10/20 0056 11/11/20 0032 11/13/20 0101 11/14/20 0508  NA 136 135 133* 136  K 4.0 4.1 4.2 3.5  CL 110 105 103 103  CO2 18* 23 24 25   GLUCOSE 116* 91 98 90  BUN 11 11 17 16   CALCIUM 8.4* 8.3* 8.5* 8.6*  CREATININE 1.86* 1.73* 2.01* 1.72*  GFRNONAA 35* 38* 32* 38*    LIVER FUNCTION TESTS: Recent Labs    10/13/20 1622 10/31/20 1126 11/05/20 1725 11/13/20 0101 11/14/20 0508  BILITOT 0.3 0.3 0.2* 0.6 0.6  AST 34 35* 50* 38 33  ALT 40 39* 54* 47* 41  ALKPHOS 56  --  64 59 54  PROT 8.2* 7.2 7.5 7.1 7.0  ALBUMIN 3.5  --  3.4* 3.1* 3.1*    Assessment and Plan:  Recurrent right perirectal abscess (had right TG drain placed in IR 10/07/2020; this was subsequently removed in IR 10/17/2020) s/p right TG drain placement in IR 11/07/2020. Right TG drain stable with approximately 20 cc clear yellow fluid in suction bulb (additional 10 cc output from drain in past 24 hours per chart). Continue current drain management- continue with Qshift flushes/monitor of output. Plan for repeat CT/possible drain injection when output <10 cc/day (assess for possible removal).  If patient  is to be discharged, below are discharge instructions: - Flush each drain once daily with 5-10 cc NS flush (patient will need an order for flushes upon discharge). - Record output from each drain once daily. - Follow-up at drain clinic 10-14 days after discharge for CT/possible drain injection (assess for possible drain removal)- IR schedulers to call patient to set up this appointment.  Further plans per [facility]/urology- appreciate and agree with management. IR to follow.  Tele-interpreter [phone] was used throughout today's interaction.   Electronically Signed: Earley Abide, PA-C 11/14/2020, 11:02 AM   I spent a total of 15 Minutes at the the patient's bedside AND on the patient's hospital floor or unit, greater than 50% of which was counseling/coordinating care for recurrent right perirectal abscess s/p right TG drain placement.

## 2020-11-14 NOTE — Progress Notes (Signed)
OB/GYN Video interrupter used during visit  S:  Pt without GYN complaints today.  O: AF VSS  Lungs clear Heart RRR Abd soft + BS enlarged uterus  A/P Enlarged fibroid uterus  Will schedule pt for outpt appt at our office for further eval for hysterectomy. Will sign off for now.  On behave of the [facility] [employer], I would like to think Dr Sloan Leiter and his medical team for allowing [place] to participate in the care of Ms Choma.

## 2020-11-14 NOTE — Progress Notes (Signed)
Subjective:  No new complaints  Antibiotics:  Anti-infectives (From admission, onward)   Start     Dose/Rate Route Frequency Ordered Stop   11/12/20 0000  dapsone 100 MG tablet  Status:  Discontinued        100 mg Oral Daily 11/12/20 0850 11/12/20    11/12/20 0000  Dolutegravir-lamiVUDine (DOVATO) 50-300 MG TABS  Status:  Discontinued        1 tablet Oral Daily 11/12/20 0850 11/12/20    11/12/20 0000  dapsone 100 MG tablet        100 mg Oral Daily 11/12/20 1216 02/10/21 2359   11/12/20 0000  Dolutegravir-lamiVUDine (DOVATO) 50-300 MG TABS        1 tablet Oral Daily 11/12/20 1216     11/10/20 1000  dapsone tablet 100 mg        100 mg Oral Daily 11/10/20 0912     11/10/20 1000  azithromycin (ZITHROMAX) tablet 1,200 mg  Status:  Discontinued        1,200 mg Oral Weekly 11/10/20 0912 11/11/20 0852   11/07/20 1400  sulfaDIAZINE tablet 1,500 mg  Status:  Discontinued        1,500 mg Oral 4 times daily 11/07/20 1032 11/07/20 1254   11/07/20 1400  clindamycin (CLEOCIN) capsule 600 mg        600 mg Oral Every 6 hours 11/07/20 1254     11/07/20 0800  pyrimethamine (DARAPRIM) tablet 75 mg  Status:  Discontinued        75 mg Oral Daily with breakfast 11/06/20 0043 11/06/20 2011   11/06/20 2200  sulfaDIAZINE tablet 1,500 mg        1,500 mg Oral 4 times daily 11/06/20 1448 11/07/20 1006   11/06/20 2100  pyrimethamine (DARAPRIM) tablet 75 mg        75 mg Oral Daily with breakfast 11/06/20 2011     11/06/20 2000  dolutegravir (TIVICAY) tablet 50 mg        50 mg Oral Daily 11/06/20 1754     11/06/20 2000  lamiVUDine (EPIVIR) tablet 300 mg       Note to Pharmacy: Do NOT adjust dose based on renal fxn   300 mg Oral Daily 11/06/20 1754     11/06/20 1800  sulfaDIAZINE tablet 1,500 mg  Status:  Discontinued        1,500 mg Oral  Once 11/06/20 1321 11/07/20 0947   11/06/20 1030  sulfaDIAZINE tablet 1,500 mg        1,500 mg Oral  Once 11/06/20 1027 11/06/20 1156   11/06/20 1000   sulfaDIAZINE tablet 1,500 mg  Status:  Discontinued        1,500 mg Oral 4 times daily 11/06/20 0043 11/07/20 1032      Medications: Scheduled Meds: . clindamycin  600 mg Oral Q6H  . dapsone  100 mg Oral Daily  . docusate sodium  100 mg Oral BID  . dolutegravir  50 mg Oral Daily  . ferrous sulfate  325 mg Oral Q breakfast  . lamiVUDine  300 mg Oral Daily  . leucovorin  25 mg Oral Daily  . levETIRAcetam  750 mg Oral BID  . magnesium oxide  400 mg Oral BID  . pantoprazole  40 mg Oral QHS  . pyrimethamine  75 mg Oral Q breakfast  . sodium chloride flush  5 mL Intracatheter Q8H   Continuous Infusions: . magnesium sulfate bolus IVPB  PRN Meds:.acetaminophen **OR** acetaminophen, calcium carbonate, phenol, promethazine    Objective: Weight change:   Intake/Output Summary (Last 24 hours) at 11/14/2020 1318 Last data filed at 11/14/2020 0745 Gross per 24 hour  Intake 120 ml  Output 10 ml  Net 110 ml   Blood pressure 115/81, pulse 88, temperature 98.8 F (37.1 C), temperature source Oral, resp. rate 18, height $RemoveBe'5\' 8"'sYfnfvoiT$  (1.727 m), weight 74.3 kg, SpO2 100 %. Temp:  [97.9 F (36.6 C)-98.8 F (37.1 C)] 98.8 F (37.1 C) (06/03 1255) Pulse Rate:  [80-88] 88 (06/03 1255) Resp:  [18] 18 (06/03 1255) BP: (101-126)/(67-88) 115/81 (06/03 1255) SpO2:  [100 %] 100 % (06/03 1255)  Physical Exam: Physical Exam Constitutional:      General: She is not in acute distress.    Appearance: She is well-developed. She is not diaphoretic.  HENT:     Head: Normocephalic and atraumatic.     Right Ear: External ear normal.     Left Ear: External ear normal.     Mouth/Throat:     Pharynx: No oropharyngeal exudate.  Eyes:     General: No scleral icterus.    Extraocular Movements: Extraocular movements intact.     Conjunctiva/sclera: Conjunctivae normal.  Cardiovascular:     Rate and Rhythm: Normal rate and regular rhythm.  Pulmonary:     Effort: Pulmonary effort is normal. No respiratory  distress.     Breath sounds: No wheezing.  Abdominal:     General: Bowel sounds are normal. There is no distension.     Palpations: Abdomen is soft.     Tenderness: There is no abdominal tenderness. There is no rebound.  Musculoskeletal:        General: No tenderness. Normal range of motion.  Lymphadenopathy:     Cervical: No cervical adenopathy.  Skin:    General: Skin is warm and dry.     Coloration: Skin is not pale.     Findings: No erythema or rash.  Neurological:     General: No focal deficit present.     Mental Status: She is alert and oriented to person, place, and time.     Motor: No abnormal muscle tone.     Coordination: Coordination normal.  Psychiatric:        Mood and Affect: Mood normal.        Behavior: Behavior normal.        Thought Content: Thought content normal.        Judgment: Judgment normal.   She has drain in place she shows me at the bedside CBC:    BMET Recent Labs    11/13/20 0101 11/14/20 0508  NA 133* 136  K 4.2 3.5  CL 103 103  CO2 24 25  GLUCOSE 98 90  BUN 17 16  CREATININE 2.01* 1.72*  CALCIUM 8.5* 8.6*     Liver Panel  Recent Labs    11/13/20 0101 11/14/20 0508  PROT 7.1 7.0  ALBUMIN 3.1* 3.1*  AST 38 33  ALT 47* 41  ALKPHOS 59 54  BILITOT 0.6 0.6       Sedimentation Rate No results for input(s): ESRSEDRATE in the last 72 hours. C-Reactive Protein No results for input(s): CRP in the last 72 hours.  Micro Results: Recent Results (from the past 720 hour(s))  Urine culture     Status: Abnormal   Collection Time: 11/05/20 11:03 PM   Specimen: Urine, Random  Result Value Ref Range Status   Specimen Description URINE,  RANDOM  Final   Special Requests NONE  Final   Culture (A)  Final    <10,000 COLONIES/mL INSIGNIFICANT GROWTH Performed at [facility], [place] [address] [place], [place] [address]    Report Status 11/07/2020 FINAL  Final  SARS CORONAVIRUS 2 (TAT 6-24 HRS) Nasopharyngeal Nasopharyngeal  Swab     Status: None   Collection Time: 11/06/20 12:14 AM   Specimen: Nasopharyngeal Swab  Result Value Ref Range Status   SARS Coronavirus 2 NEGATIVE NEGATIVE Final    Comment: (NOTE) SARS-CoV-2 target nucleic acids are NOT DETECTED.  The SARS-CoV-2 RNA is generally detectable in upper and lower respiratory specimens during the acute phase of infection. Negative results do not preclude SARS-CoV-2 infection, do not rule out co-infections with other pathogens, and should not be used as the sole basis for treatment or other patient management decisions. Negative results must be combined with clinical observations, patient history, and epidemiological information. The expected result is Negative.  Fact Sheet for Patients: SugarRoll.be  Fact Sheet for Healthcare Providers: https://www.woods-mathews.com/  This test is not yet approved or cleared by the [place] FDA and  has been authorized for detection and/or diagnosis of SARS-CoV-2 by FDA under an Emergency Use Authorization (EUA). This EUA will remain  in effect (meaning this test can be used) for the duration of the COVID-19 declaration under Se ction 564(b)(1) of the Act, 21 U.S.C. section 360bbb-3(b)(1), unless the authorization is terminated or revoked sooner.  Performed at [facility], [place] [address] [place], [place] [address]   Aerobic/Anaerobic Culture w Gram Stain (surgical/deep wound)     Status: None   Collection Time: 11/07/20 10:10 AM   Specimen: Abscess  Result Value Ref Range Status   Specimen Description ABSCESS  Final   Special Requests PELVIC RIGHT  Final   Gram Stain   Final    ABUNDANT WBC PRESENT, PREDOMINANTLY PMN NO ORGANISMS SEEN    Culture   Final    No growth aerobically or anaerobically. Performed at [facility], [place] [address], [place], [place] [address]    Report Status 11/12/2020 FINAL  Final  Acid Fast Smear (AFB)     Status: None    Collection Time: 11/07/20 10:10 AM   Specimen: Perirectal; Abscess  Result Value Ref Range Status   AFB Specimen Processing Concentration  Final   Acid Fast Smear Negative  Final    Comment: (NOTE) Performed At: [facility] [place], [place] [id number] Rush Farmer MD [id number]    Source (AFB) PELVIC RIGHT  Final    Comment: Performed at [facility], [place] [address] [place], [place] [address]  Acid Fast Smear (AFB)     Status: None   Collection Time: 11/11/20  7:28 PM   Specimen: Vein; Blood  Result Value Ref Range Status   AFB Specimen Processing Direct Inoculation  Final   Acid Fast Smear Negative  Final    Comment: (NOTE) Performed At: [facility] [place], [place] [id number] Rush Farmer MD CN:[id number]     Studies/Results: US RENAL  Result Date: 11/13/2020 CLINICAL DATA:  HIV, hydronephrosis EXAM: RENAL / URINARY TRACT ULTRASOUND COMPLETE COMPARISON:  11/05/2020 FINDINGS: Right Kidney: Renal measurements: 12.9 x 5.7 x 7.0 cm = volume: 264 mL. Increased renal echogenicity with preserved cortical thickness. Similar very mild hydronephrosis. Left Kidney: Renal measurements: 13.6 x 6.8 x 6.8 cm = volume: 328 mL. Increased renal echogenicity. Preserved normal cortical  thickness. Very minor hydronephrosis. Bladder: Within normal limits for degree of distension. Enlarged uterus presumed related to fibroids. Other: No free fluid. IMPRESSION: Increased renal echogenicity compatible with medical renal disease. Similar minor bilateral hydronephrosis, possibly related to mass effect from the large fibroid uterus. Electronically Signed   By: Jerilynn Mages.  Shick M.D.   On: 11/13/2020 11:39   DG CHEST PORT 1 VIEW  Result Date: 11/12/2020 CLINICAL DATA:  Shortness of breath EXAM: PORTABLE CHEST 1 VIEW COMPARISON:  CT 09/29/2020 FINDINGS: Borderline to mild cardiomegaly. No focal opacity or pleural effusion. No pneumothorax. IMPRESSION: No  active disease.  Borderline cardiomegaly Electronically Signed   By: Donavan Foil M.D.   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  Is also complaining of headaches and blurry vision we saw her last week.  1.  Acute renal failure: I continue to believe that this was more likely precipitated by obstructive pathology.  We have also though removed tenofovir thrown her regimen and will not plan on adding it back  Cr is stable  I explained to the patient again today via iPad translation portal that definitive management of her obstructive uropathy would mean need for hysterectomy.  Greatly appreciate urology gynecology and nephrology's help with her.  We will look to them for guidance in terms of timing of subsequent interventions.  Absent definitive treatment we will monitor her creatinine as closely as possible in the infectious disease clinic.  2.  HIV disease: Her HIV regimen will be single tablet Dovato going forward we do not yet have this on the hospital formulary so Jimmy Footman, Pharm D has procured them and will fill her pillboxes with it before leaves.  NEITHER of the components has any nephrotoxicity whatsoever  She is on dapsone for PCP prevention  #3 Enlarged uterus possible fibroids versus leiomyosarcoma: It is interesting to me that her acute renal failure and  sending obstructive pathology occurred in the context of heavy menstrual bleed bleeding.  I personally would feel much better about her renal safety if she would undergo hysterectomy.  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  IR following as well have been surgery there is some concerned about a fistula potentially between bowel and area where the abscess was found  #7 Leukopenia and anemia: Likely multifactorial I discontinued her M AVM prophylaxis in case she ends up getting a bone marrow biopsy is that we might build to send for AFB stain and cultures  #  8 Seizures: it turns out the keppra is drug she has difficulty tolerating  consider liquid formulation to see if this would help but I doubt this is on [facility] formulary  She needs followup with Neurology to determine if alternative drug appropriate PROVIDED IT DOES NOT INTERACT with her ARV's and also ? How long she will need AED    [place] has an appointment on 11/20/2020 at 945 AM with Janene Madeira, NP  The [facility] is located in the [facility] at  [place] in [place].  [address], which is located to the left of the elevators.  Phone: [phone]  Fax: [phone]  [email or web address]  She should arrive 15 to 30 minutes prior to her appointment.  I spent more than 35 minutes with the patient including greater than 50% of time in face to face counseling of the patient using [place] translator (iPad) personally reviewing radiographs, long  with pertinent laboratory microbiological data review of medical records and in coordination of her care.  Dr Juleen China will be available for questions this weekend and monitor her labs if she is still in the inpatient world.      LOS: 8 days   Alcide Evener 11/14/2020, 1:18 PM

## 2020-11-14 NOTE — Progress Notes (Signed)
PROGRESS NOTE        PATIENT DETAILS Name: Candace Jones Age: 40 y.o. Sex: female Date of Birth: 21-Mar-1981 Admit Date: 11/05/2020 Admitting Physician Mariel Aloe, MD [id number], No Pcp Per (Inactive)  Brief Narrative: Patient is a 40 y.o. female AIDS, toxoplasmosis, recent perirectal abscess-presented with AKI.  Admitted for further evaluation and treatment.  See below for further details.  Significant events: 5/25>> admit for AKI  Significant studies: 5/25>> CT abdomen/pelvis: Recurrent perirectal abscess, uterine fibroids, mild to moderate hydroureteronephrosis 5/28>> MRI brain: Interval improvement of ring-enhancing lesions 6/1>> chest x-ray: No pneumonia 6/2>> renal ultrasound: Increased renal echogenicity compatible with medical renal disease, mild bilateral hydronephrosis  Antimicrobial therapy: See below  Microbiology data: 5/25>> urine culture: Insignificant growth 5/27>> pelvic/perirectal abscess: No growth 5/27>> pelvic/perirectal abscess: AFB smear negative 5/27>> pelvic/perirectal abscess: AFB culture: Pending  Procedures : 5/27>> CT-guided placement of drainage catheter in the right perirectal space  Consults: ID, urology, GYN, hematology, general surgery, IR, nephrology  DVT Prophylaxis : Place and maintain sequential compression device Start: 11/12/20 1910   Subjective: No chest pain shortness of breath-lying comfortably in bed.   Assessment/Plan: AKI: Unclear etiology-either tenofovir induced tubular damage or obstructive uropathy due to fibroids.  Renal function seems to have plateaued around 1.7-2.0 range-avoid nephrotoxic agents-await further recommendations from nephrology/urology/GYN.  Fibroids uterus with bilateral hydronephrosis: GYN-urology following-we will await further recommendations-felt to require hysterectomy at some point in time.  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  Hemoglobin following-recommendations are to continue ART-and follow-up in the outpatient setting.  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  ID following.  MRI this admit with significant radiological improvement.  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  On dapsone for PCP prophylaxis  Hypokalemia/hypomagnesemia: Replete and recheck   Diet: Diet Order            Diet renal with fluid restriction Fluid restriction: 1200 mL Fluid; Room service appropriate? Yes; Fluid consistency: Thin  Diet effective now                  Code Status: Full code   Family Communication: None at bedside   Disposition Plan: Status is: Inpatient  Remains inpatient appropriate because:Inpatient level of care appropriate due to severity of illness   Dispo: The patient is from: Home              Anticipated d/c is to: Home              Patient currently is not medically stable to d/c.   Difficult to place patient No    Barriers to Discharge: Lingering AKI-not yet at baseline-other numerous issues as above.  Likely needs another day or so of inpatient monitoring.  Antimicrobial agents: Anti-infectives (From admission, onward)   Start     Dose/Rate Route Frequency Ordered Stop   11/12/20 0000  dapsone 100 MG tablet  Status:  Discontinued        100 mg Oral Daily 11/12/20 0850 11/12/20    11/12/20 0000  Dolutegravir-lamiVUDine (DOVATO) 50-300 MG TABS  Status:  Discontinued  1 tablet Oral Daily 11/12/20 0850 11/12/20    11/12/20 0000  dapsone 100 MG tablet        100 mg Oral Daily 11/12/20 1216 02/10/21 2359   11/12/20 0000  Dolutegravir-lamiVUDine  (DOVATO) 50-300 MG TABS        1 tablet Oral Daily 11/12/20 1216     11/10/20 1000  dapsone tablet 100 mg        100 mg Oral Daily 11/10/20 0912     11/10/20 1000  azithromycin (ZITHROMAX) tablet 1,200 mg  Status:  Discontinued        1,200 mg Oral Weekly 11/10/20 0912 11/11/20 0852   11/07/20 1400  sulfaDIAZINE tablet 1,500 mg  Status:  Discontinued        1,500 mg Oral 4 times daily 11/07/20 1032 11/07/20 1254   11/07/20 1400  clindamycin (CLEOCIN) capsule 600 mg        600 mg Oral Every 6 hours 11/07/20 1254     11/07/20 0800  pyrimethamine (DARAPRIM) tablet 75 mg  Status:  Discontinued        75 mg Oral Daily with breakfast 11/06/20 0043 11/06/20 2011   11/06/20 2200  sulfaDIAZINE tablet 1,500 mg        1,500 mg Oral 4 times daily 11/06/20 1448 11/07/20 1006   11/06/20 2100  pyrimethamine (DARAPRIM) tablet 75 mg        75 mg Oral Daily with breakfast 11/06/20 2011     11/06/20 2000  dolutegravir (TIVICAY) tablet 50 mg        50 mg Oral Daily 11/06/20 1754     11/06/20 2000  lamiVUDine (EPIVIR) tablet 300 mg       Note to Pharmacy: Do NOT adjust dose based on renal fxn   300 mg Oral Daily 11/06/20 1754     11/06/20 1800  sulfaDIAZINE tablet 1,500 mg  Status:  Discontinued        1,500 mg Oral  Once 11/06/20 1321 11/07/20 0947   11/06/20 1030  sulfaDIAZINE tablet 1,500 mg        1,500 mg Oral  Once 11/06/20 1027 11/06/20 1156   11/06/20 1000  sulfaDIAZINE tablet 1,500 mg  Status:  Discontinued        1,500 mg Oral 4 times daily 11/06/20 0043 11/07/20 1032       Time spent: 25 minutes-Greater than 50% of this time was spent in counseling, explanation of diagnosis, planning of further management, and coordination of care.  MEDICATIONS: Scheduled Meds: . clindamycin  600 mg Oral Q6H  . dapsone  100 mg Oral Daily  . docusate sodium  100 mg Oral BID  . dolutegravir  50 mg Oral Daily  . ferrous sulfate  325 mg Oral Q breakfast  . lamiVUDine  300 mg Oral Daily  . leucovorin  25  mg Oral Daily  . levETIRAcetam  750 mg Oral BID  . magnesium oxide  400 mg Oral BID  . pantoprazole  40 mg Oral QHS  . pyrimethamine  75 mg Oral Q breakfast  . sodium chloride flush  5 mL Intracatheter Q8H   Continuous Infusions: . magnesium sulfate bolus IVPB     PRN Meds:.acetaminophen **OR** acetaminophen, calcium carbonate, phenol, promethazine   PHYSICAL EXAM: Vital signs: Vitals:   11/13/20 0426 11/13/20 1221 11/13/20 2121 11/14/20 0429  BP: 102/71 126/84 126/88 101/67  Pulse: 73 77 80 81  Resp: 17  18 18   Temp: 98.1 F (36.7 C) 98 F (36.7  C) 97.9 F (36.6 C) 98.6 F (37 C)  TempSrc: Oral Oral Oral Oral  SpO2: 100% 100% 100% 100%  Weight:      Height:       Filed Weights   11/09/20 2100  Weight: 74.3 kg   Body mass index is 24.91 kg/m.   Gen Exam:Alert awake-not in any distress HEENT:atraumatic, normocephalic Chest: B/L clear to auscultation anteriorly CVS:S1S2 regular Abdomen:soft non tender, non distended Extremities:no edema Neurology: Non focal Skin: no rash  I have personally reviewed following labs and imaging studies  LABORATORY DATA: CBC: Recent Labs  Lab 11/09/20 0043 11/10/20 0056 11/11/20 0032 11/13/20 0101 11/14/20 0508  WBC 3.5* 3.7* 3.6* 4.0 3.3*  NEUTROABS  --   --   --  2.0 2.3  HGB 7.4* 7.9* 8.1* 8.1* 8.0*  HCT 23.5* 25.3* 25.7* 25.9* 25.2*  MCV 76.8* 78.3* 77.9* 78.7* 78.0*  PLT 178 189 211 212 161    Basic Metabolic Panel: Recent Labs  Lab 11/09/20 1430 11/10/20 0056 11/11/20 0032 11/13/20 0101 11/14/20 0508  NA 136 136 135 133* 136  K 4.5 4.0 4.1 4.2 3.5  CL 111 110 105 103 103  CO2 19* 18* 23 24 25   GLUCOSE 80 116* 91 98 90  BUN 17 11 11 17 16   CREATININE 2.14* 1.86* 1.73* 2.01* 1.72*  CALCIUM 8.3* 8.4* 8.3* 8.5* 8.6*  MG  --   --   --  1.5* 1.6*  PHOS  --   --   --  4.2 4.4    GFR: Estimated Creatinine Clearance: 43.9 mL/min (A) (by C-G formula based on SCr of 1.72 mg/dL (H)).  Liver Function  Tests: Recent Labs  Lab 11/13/20 0101 11/14/20 0508  AST 38 33  ALT 47* 41  ALKPHOS 59 54  BILITOT 0.6 0.6  PROT 7.1 7.0  ALBUMIN 3.1* 3.1*   No results for input(s): LIPASE, AMYLASE in the last 168 hours. No results for input(s): AMMONIA in the last 168 hours.  Coagulation Profile: No results for input(s): INR, PROTIME in the last 168 hours.  Cardiac Enzymes: No results for input(s): CKTOTAL, CKMB, CKMBINDEX, TROPONINI in the last 168 hours.  BNP (last 3 results) No results for input(s): PROBNP in the last 8760 hours.  Lipid Profile: No results for input(s): CHOL, HDL, LDLCALC, TRIG, CHOLHDL, LDLDIRECT in the last 72 hours.  Thyroid Function Tests: No results for input(s): TSH, T4TOTAL, FREET4, T3FREE, THYROIDAB in the last 72 hours.  Anemia Panel: No results for input(s): VITAMINB12, FOLATE, FERRITIN, TIBC, IRON, RETICCTPCT in the last 72 hours.  Urine analysis:    Component Value Date/Time   COLORURINE STRAW (A) 11/06/2020 1530   APPEARANCEUR HAZY (A) 11/06/2020 1530   LABSPEC 1.008 11/06/2020 1530   PHURINE 6.0 11/06/2020 1530   GLUCOSEU NEGATIVE 11/06/2020 1530   HGBUR LARGE (A) 11/06/2020 1530   BILIRUBINUR NEGATIVE 11/06/2020 1530   KETONESUR NEGATIVE 11/06/2020 1530   PROTEINUR NEGATIVE 11/06/2020 1530   NITRITE NEGATIVE 11/06/2020 1530   LEUKOCYTESUR LARGE (A) 11/06/2020 1530    Sepsis Labs: Lactic Acid, Venous No results found for: LATICACIDVEN  MICROBIOLOGY: Recent Results (from the past 240 hour(s))  Urine culture     Status: Abnormal   Collection Time: 11/05/20 11:03 PM   Specimen: Urine, Random  Result Value Ref Range Status   Specimen Description URINE, RANDOM  Final   Special Requests NONE  Final   Culture (A)  Final    <10,000 COLONIES/mL INSIGNIFICANT GROWTH Performed at [facility], [place] [address] [place], [place] [address]    Report Status 11/07/2020 FINAL  Final  SARS CORONAVIRUS 2 (TAT 6-24 HRS) Nasopharyngeal  Nasopharyngeal Swab     Status: None   Collection Time: 11/06/20 12:14 AM   Specimen: Nasopharyngeal Swab  Result Value Ref Range Status   SARS Coronavirus 2 NEGATIVE NEGATIVE Final    Comment: (NOTE) SARS-CoV-2 target nucleic acids are NOT DETECTED.  The SARS-CoV-2 RNA is generally detectable in upper and lower respiratory specimens during the acute phase of infection. Negative results do not preclude SARS-CoV-2 infection, do not rule out co-infections with other pathogens, and should not be used as the sole basis for treatment or other patient management decisions. Negative results must be combined with clinical observations, patient history, and epidemiological information. The expected result is Negative.  Fact Sheet for Patients: SugarRoll.be  Fact Sheet for Healthcare Providers: https://www.woods-mathews.com/  This test is not yet approved or cleared by the [place] FDA and  has been authorized for detection and/or diagnosis of SARS-CoV-2 by FDA under an Emergency Use Authorization (EUA). This EUA will remain  in effect (meaning this test can be used) for the duration of the COVID-19 declaration under Se ction 564(b)(1) of the Act, 21 U.S.C. section 360bbb-3(b)(1), unless the authorization is terminated or revoked sooner.  Performed at [facility], [place] [address] [place], [place] [address]   Aerobic/Anaerobic Culture w Gram Stain (surgical/deep wound)     Status: None   Collection Time: 11/07/20 10:10 AM   Specimen: Abscess  Result Value Ref Range Status   Specimen Description ABSCESS  Final   Special Requests PELVIC RIGHT  Final   Gram Stain   Final    ABUNDANT WBC PRESENT, PREDOMINANTLY PMN NO ORGANISMS SEEN    Culture   Final    No growth aerobically or anaerobically. Performed at [facility], [place] [address], [place], [place] [address]    Report Status 11/12/2020 FINAL  Final  Acid Fast Smear (AFB)      Status: None   Collection Time: 11/07/20 10:10 AM   Specimen: Perirectal; Abscess  Result Value Ref Range Status   AFB Specimen Processing Concentration  Final   Acid Fast Smear Negative  Final    Comment: (NOTE) Performed At: [facility] [place], [place] [id number] Rush Farmer MD NI:[id number]    Source (AFB) PELVIC RIGHT  Final    Comment: Performed at [facility], [place] [address], [place], [place] [address]  Acid Fast Smear (AFB)     Status: None   Collection Time: 11/11/20  7:28 PM   Specimen: Vein; Blood  Result Value Ref Range Status   AFB Specimen Processing Direct Inoculation  Final   Acid Fast Smear Negative  Final    Comment: (NOTE) Performed At: [facility] [place], [place] [id number] Rush Farmer MD PJ:[id number]     RADIOLOGY STUDIES/RESULTS: US RENAL  Result Date: 11/13/2020 CLINICAL DATA:  HIV, hydronephrosis EXAM: RENAL / URINARY TRACT ULTRASOUND COMPLETE COMPARISON:  11/05/2020 FINDINGS: Right Kidney: Renal measurements: 12.9 x 5.7 x 7.0 cm = volume: 264 mL. Increased renal echogenicity with preserved cortical thickness. Similar very mild hydronephrosis. Left Kidney: Renal measurements: 13.6 x 6.8 x 6.8 cm = volume: 328 mL. Increased renal echogenicity. Preserved normal cortical thickness. Very minor hydronephrosis. Bladder: Within normal limits for degree of distension. Enlarged uterus presumed related to fibroids. Other: No free fluid. IMPRESSION: Increased renal  echogenicity compatible with medical renal disease. Similar minor bilateral hydronephrosis, possibly related to mass effect from the large fibroid uterus. Electronically Signed   By: Jerilynn Mages.  Shick M.D.   On: 11/13/2020 11:39   DG CHEST PORT 1 VIEW  Result Date: 11/12/2020 CLINICAL DATA:  Shortness of breath EXAM: PORTABLE CHEST 1 VIEW COMPARISON:  CT 09/29/2020 FINDINGS: Borderline to mild cardiomegaly. No focal opacity or pleural effusion. No  pneumothorax. IMPRESSION: No active disease.  Borderline cardiomegaly Electronically Signed   By: Donavan Foil M.D.   On: 11/12/2020 19:27     LOS: 8 days   Oren Binet, MD  [facility]    To contact the attending provider between 7A-7P or the covering provider during after hours 7P-7A, please log into the web site [email or web address] and access using universal  password for that web site. If you do not have the password, please call the hospital operator.  11/14/2020, 10:28 AM

## 2020-11-14 NOTE — TOC Progression Note (Signed)
Transition of Care [facility]) - Progression Note    Patient Details  Name: Candace Jones MRN: [id number] Date of Birth: [birth year]  Transition of Care [facility]) CM/SW Contact  Graves-Bigelow, Ocie Cornfield, RN Phone Number: 11/14/2020, 4:50 PM  Clinical Narrative: Case Manager spoke with patient regarding home health needs. Patient is without insurance at this time. Patient will need charity home health via [place]. Alvis Lemmings is agreeable to services. Start of care to begin within 24-48 hours post transition home. Patient states sister will be able to assist with flushes- Alvis Lemmings is aware. Case Manager asked staff RN to educate the patient's sisters on rectal flushes prior to transition home. No further needs identified at this time.      Expected Discharge Plan: [place] Barriers to Discharge: Continued Medical Work up  Expected Discharge Plan and Services Expected Discharge Plan: [place] In-house Referral: NA Discharge Planning Services: CM Consult Post Acute Care Choice: [place] arrangements for the past 2 months: Apartment                 DME Arranged: N/A DME Agency: NA       HH Arranged: RN [place] Agency: [place] Date [place]: 11/14/20 Time [place]: 1600 Representative spoke with at [place]: Tommi Rumps  Readmission Risk Interventions No flowsheet data found.

## 2020-11-15 DIAGNOSIS — N1330 Unspecified hydronephrosis: Secondary | ICD-10-CM

## 2020-11-15 LAB — BASIC METABOLIC PANEL
Anion gap: 7 (ref 5–15)
BUN: 16 mg/dL (ref 6–20)
CO2: 25 mmol/L (ref 22–32)
Calcium: 8.9 mg/dL (ref 8.9–10.3)
Chloride: 103 mmol/L (ref 98–111)
Creatinine, Ser: 1.59 mg/dL — ABNORMAL HIGH (ref 0.44–1.00)
GFR, Estimated: 42 mL/min — ABNORMAL LOW (ref >60)
Glucose, Bld: 83 mg/dL (ref 70–99)
Potassium: 3.4 mmol/L — ABNORMAL LOW (ref 3.5–5.1)
Sodium: 135 mmol/L (ref 135–145)

## 2020-11-15 LAB — METHYLMALONIC ACID, SERUM: Methylmalonic Acid, Quantitative: 83 nmol/L (ref 0–378)

## 2020-11-15 MED ORDER — DOVATO 50-300 MG PO TABS: 1.0000 | ORAL_TABLET | Freq: Every day | ORAL | 3 refills | Status: DC

## 2020-11-15 MED ORDER — POTASSIUM CHLORIDE 20 MEQ PO PACK
40.0000 meq | PACK | Freq: Once | ORAL | Status: AC
  Administered 2020-11-15: 40 meq via ORAL
  Filled 2020-11-15: qty 2

## 2020-11-15 MED ORDER — MAGNESIUM OXIDE -MG SUPPLEMENT 400 (240 MG) MG PO TABS: 400.0000 mg | ORAL_TABLET | Freq: Two times a day (BID) | ORAL | 0 refills | Status: DC

## 2020-11-15 NOTE — Progress Notes (Signed)
Patient ID: Candace Jones, female   DOB: 01-Feb-1981, 40 y.o.   MRN: [id number] [facility] Progress Note   Assessment/ Plan:   1. Acute kidney Injury: Nonoliguric.  Initial injury felt to be primarily from tenofovir associated renal injury and based on assessment from urology, likely with a component of obstruction given persistent hydronephrosis in the setting of an enlarged uterus.  She will follow-up with gynecology for outpatient evaluation and hysterectomy.  She continues to have sluggishly improving renal function with creatinine today down to 1.6.  We will set her up for outpatient nephrology follow-up for ongoing surveillance of renal function. 2.  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  Antiretroviral therapy per ID service.  Avoid tenofovir. 3.  Mild to moderate bilateral hydroureteronephrosis: Secondary to enlarged uterus from fibroid load.  Seen yesterday by urology and their notes reviewed.  Definitive management would be hysterectomy with percutaneous nephrostomy tubes indicated as a bridge to this if renal function starts to worsen again. 4.  Perirectal abscess: Status post percutaneous drainage placement by IR on 5/27 and will need home health for additional care.  Subjective:   Denies any complaints and inquires about discharge.   Objective:   BP 122/84 (BP Location: Right Arm)   Pulse 81   Temp 98 F (36.7 C) (Oral)   Resp 20   Ht 5\' 8"  (1.727 m)   Wt 74.3 kg   SpO2 99%   BMI 24.91 kg/m   Intake/Output Summary (Last 24 hours) at 11/15/2020 1117 Last data filed at 11/14/2020 2108 Gross per 24 hour  Intake 135 ml  Output 10 ml  Net 125 ml   Weight change:   Physical Exam: Gen: Sitting up comfortably in chair CVS: Pulse regular rhythm, normal rate, S1 and S2 normal Resp: Clear to auscultation bilaterally, no distinct rales or rhonchi Abd: Soft, flat, nontender, bowel sounds normal Ext: No lower  extremity edema  Imaging: US RENAL  Result Date: 11/13/2020 CLINICAL DATA:  HIV, hydronephrosis EXAM: RENAL / URINARY TRACT ULTRASOUND COMPLETE COMPARISON:  11/05/2020 FINDINGS: Right Kidney: Renal measurements: 12.9 x 5.7 x 7.0 cm = volume: 264 mL. Increased renal echogenicity with preserved cortical thickness. Similar very mild hydronephrosis. Left Kidney: Renal measurements: 13.6 x 6.8 x 6.8 cm = volume: 328 mL. Increased renal echogenicity. Preserved normal cortical thickness. Very minor hydronephrosis. Bladder: Within normal limits for degree of distension. Enlarged uterus presumed related to fibroids. Other: No free fluid. IMPRESSION: Increased renal echogenicity compatible with medical renal disease. Similar minor bilateral hydronephrosis, possibly related to mass effect from the large fibroid uterus. Electronically Signed   By: Jerilynn Mages.  Shick M.D.   On: 11/13/2020 11:39    Labs: BMET Recent Labs  Lab 11/09/20 1430 11/10/20 0056 11/11/20 0032 11/13/20 0101 11/14/20 0508 11/15/20 1016  NA 136 136 135 133* 136 135  K 4.5 4.0 4.1 4.2 3.5 3.4*  CL 111 110 105 103 103 103  CO2 19* 18* 23 24 25 25   GLUCOSE 80 116* 91 98 90 83  BUN 17 11 11 17 16 16   CREATININE 2.14* 1.86* 1.73* 2.01* 1.72* 1.59*  CALCIUM 8.3* 8.4* 8.3* 8.5* 8.6* 8.9  PHOS  --   --   --  4.2 4.4  --    CBC Recent Labs  Lab 11/10/20 0056 11/11/20 0032 11/13/20 0101 11/14/20 0508  WBC 3.7* 3.6* 4.0 3.3*  NEUTROABS  --   --  2.0 2.3  HGB 7.9* 8.1* 8.1* 8.0*  HCT 25.3* 25.7* 25.9* 25.2*  MCV 78.3* 77.9* 78.7* 78.0*  PLT 189 211 212 203    Medications:    . clindamycin  600 mg Oral Q6H  . dapsone  100 mg Oral Daily  . docusate sodium  100 mg Oral BID  . dolutegravir  50 mg Oral Daily  . ferrous sulfate  325 mg Oral Q breakfast  . lamiVUDine  300 mg Oral Daily  . leucovorin  25 mg Oral Daily  . levETIRAcetam  750 mg Oral BID  . magnesium oxide  400 mg Oral BID  . pantoprazole  40 mg Oral QHS  .  pyrimethamine  75 mg Oral Q breakfast  . sodium chloride flush  5 mL Intracatheter Q8H   Elmarie Shiley, MD 11/15/2020, 11:17 AM

## 2020-11-15 NOTE — Progress Notes (Signed)
Candace Jones to be D/C'd Home per MD order.  Discussed with the patient and all questions fully answered.  VSS, Skin clean, dry and intact without evidence of skin break down, no evidence of skin tears noted. IV catheter discontinued intact. Site without signs and symptoms of complications. Dressing and pressure applied.  An After Visit Summary was printed and given to the patient. Patient received prescription.  D/c education completed with patient/family including follow up instructions, medication list, d/c activities limitations if indicated, with other d/c instructions as indicated by MD - patient able to verbalize understanding, all questions fully answered.   Patient instructed to return to ED, call 911, or call MD for any changes in condition.   Patient escorted via [place], and D/C home via private auto.  [place] 11/15/2020 8:04 PM

## 2020-11-15 NOTE — Discharge Summary (Signed)
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  French-speaking female with medical history significant of AIDS, toxoplasma meningoencephalitis who was started on [name] recently for AIDS, but follow-up lab work revealed that the patient had a significant increase and BUN/creatinine and decrease in GFR.  She was subsequently sent to the ED for further evaluation and treatment.  ED Course: Initial vital signs temperature 98.5 F, pulse 84, respiration 17, BP 131/83 mmHg O2 sat 100% on room air.  The patient received a 1000 mL NS bolus. Lab work: Urinalysis had interference of urine pigment.  CBC shows a white count of 3.3, hemoglobin 8.2 g/dL and platelets 204.  Sodium 135, potassium 5.2, chloride 107 and CO2 21 mmol/L.  Glucose 100, BUN 26 and creatinine 4.14 mg/dL. CT abdomen without contrast showed patient has reaccumulation of thick-walled collection along the right pelvic floor following removal of a drainage catheter concerning for recurrent perirectal abscess.  Persistent and slightly increased mild to moderate hydronephrosis at least to the level of the markedly enlarged uterus, possibly secondary to [name] course: Patient admitted for further evaluation of AKI.  She received Lokelma in the ED for  hyperkalemia and admitted with IV fluids.  Biktarvy was held and infectious diseases, GYN-urology consulted for AKI possibly caused by medications versus obstructive uropathy due to uterine mass.  AKI: Felt possibly tenofovir induced tubular damage or obstructive uropathy due to fibroids.  Renal function seems to have plateaued around 1.7-2.0 range over the last few days-further improved to 1.6 today.  Seen by nephrology as well as GYN urology.  Advised avoid nephrotoxic agents (was taking NSAIDs)-seen by infectious diseases Dr. Drucilla Schmidt and HIV medications changed.  Fibroids uterus with bilateral hydronephrosis: GYN-urology evaluated for possible hysterectomy versus nephrostomy decompression-Dr. Rip Harbour recommended outpatient follow-up and appointment being set up by them.   Multifactorial anemia: Due to blood loss from hematuria/menorrhagia-but also felt to have anemia of chronic disease due to underlying HIV.  Hemoglobin overall stable-recommendations are to continue ART-and follow-up in the outpatient setting.  Recurrent perirectal abscess: Prior drain was removed on 5/6-unfortunately reoccurred-General surgery/IR following-on clindamycin-perirectal drain placed by IR on 5/27.  Cultures unrevealing so far.  Per last radiology note from 6/2 stated--"if patient were to be disharged before the drain can be removed in the hospital, our clinic will call patient to set upa follow upappointment for CT scan andpossibledrain injection at the clinic 1-2 weeks after the discharge. Out patient follow up orders in".  Out patient discharge instruction for the drain and dressing:   ? Patient to flush the drain with 5 cc normal saline daily ? Record output daily (subtract 5 cc that was used to flush the drain from the total daily output)  ? Dressing change as needed and at least once a week.  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  MRI this admit  with significant radiological improvement.  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  On dapsone for PCP prophylaxis.  Biktarvy discontinued and replaced by new medication Dovato--which was obtained and also pillbox arranged with the help of ID pharmacy on 6/2.  Please see ID note from 6/3 for details regarding medication changes.  Hypokalemia/hypomagnesemia: Repleted during hospital stay and recheck labs show magnesium 1.7, potassium 3.4.  Additional potassium replacement given prior to discharge and p.o. magnesium prescription given for 5 days.    Discharge Exam:   Vitals:   11/14/20 1255 11/14/20 2150 11/15/20 0708 11/15/20 1539  BP: 115/81 121/83 122/84 119/78  Pulse: 88 86 81 88  Resp: 18 18 20 18   Temp: 98.8 F (37.1 C) 97.9 F (36.6 C) 98 F (36.7 C) 98.9 F (37.2 C)  TempSrc: Oral Oral Oral Oral  SpO2: 100% 98% 99% 100%  Weight:      Height:        General: Pt is alert, awake, ambulating comfortably in the room, not in acute distress Cardiovascular: RRR, S1/S2 +, no rubs, no gallops Respiratory: CTA bilaterally, no wheezing, no rhonchi Abdominal: Soft, NT, ND, bowel sounds + Extremities: no edema, no cyanosis  Discharge Condition:Stable CODE STATUS: Diet recommendation: Recommendations for Outpatient Follow-up:  1. Follow up with PCP:  2. Follow up with consultants:  3. Please obtain follow up labs including:   Home Health services upon discharge:  Equipment/Devices upon discharge: Intergluteal drain Out patient discharge instruction for the drain and dressing:   ? Patient to flush the drain with 5 cc normal saline daily ? Record output daily (subtract 5 cc that was used to flush the drain from the total daily output)  ? Dressing change as needed and at least once a week.   Discharge Instructions:  Discharge Instructions    Call MD for:  difficulty breathing, headache or visual disturbances   Complete by: As directed     Call MD for:  extreme fatigue   Complete by: As directed    Call MD for:  persistant nausea and vomiting   Complete by: As directed    Call MD for:  redness, tenderness, or signs of infection (pain, swelling, redness, odor or green/yellow discharge around incision site)   Complete by: As directed    Call MD for:  severe uncontrolled pain   Complete by: As directed    Call MD for:  temperature >100.4   Complete by: As directed    Consult to Transition of Care Team   Complete by: As directed    Diet - low sodium heart healthy   Complete by: As directed    Discharge instructions   Complete by: As directed    Please pay close attention to medication changes and instructions to take   Increase activity slowly   Complete by: As directed    No wound care   Complete by: As directed      Allergies as of 11/15/2020   No Known Allergies     Medication List    STOP taking these medications   Advil 200 MG tablet Generic drug: ibuprofen   Biktarvy 50-200-25 MG Tabs tablet Generic drug: bictegravir-emtricitabine-tenofovir AF   levETIRAcetam 750 MG tablet Commonly known as: KEPPRA Replaced by: levETIRAcetam 100 MG/ML solution     TAKE these medications   clindamycin 300 MG capsule Commonly known as: Cleocin Take 2 capsules (600 mg total) by mouth 4 (four) times daily. Accidentally sent to [facility] location. Will  be picked up this evening. Thanks!   dapsone 100 MG tablet Take 1 tablet (100 mg total) by mouth daily.   docusate sodium 100 MG capsule Commonly known as: COLACE Take 1 capsule (100 mg total) by mouth 2 (two) times daily.   Dovato 50-300 MG Tabs Generic drug: Dolutegravir-lamiVUDine Take 1 tablet by mouth daily.   FeroSul 325 (65 FE) MG tablet Generic drug: ferrous sulfate Take 1 tablet (325 mg total) by mouth daily with breakfast.   leucovorin 25 MG tablet Commonly known as: WELLCOVORIN Take 1 tablet (25 mg total) by mouth daily.   levETIRAcetam 100 MG/ML  solution Commonly known as: KEPPRA Take 7.5 mLs (750 mg total) by mouth 2 (two) times daily. Replaces: levETIRAcetam 750 MG tablet   magnesium oxide 400 (240 Mg) MG tablet Commonly known as: MAG-OX Take 1 tablet (400 mg total) by mouth 2 (two) times daily.   omeprazole 20 MG capsule Commonly known as: PRILOSEC Take 1 capsule (20 mg total) by mouth daily.   promethazine 12.5 MG tablet Commonly known as: PHENERGAN Take 1 tablet (12.5 mg total) by mouth every 6 (six) hours as needed for nausea or vomiting.   pyrimethamine 25 MG tablet Commonly known as: DARAPRIM Take 3 tablets (75 mg total) by mouth daily with breakfast. What changed: when to take this       Follow-up Information    [facility] [place] Follow up on 12/02/2020.   Specialty: General Surgery Why: 9:45 PM arrive by 9:15pm for paperwork and check in process.  This is to follow up on your recent drain placement and abscess in your pelvis Contact information: [address] [place] [address] [phone]        Suzette Battiest, MD Follow up in 1 week(s).   Specialties: Interventional Radiology, Diagnostic Radiology, Radiology Why: Our office will call you to arrange this follow up appointment for your drain in 10-14 days after discharge.  Contact information: [place] [address] [phone]        [place] Follow up on 12/19/2020.   Specialty: Family Medicine Why: Your appointment is at 9:10 am. Please arrive early and bring a picture ID and your current medications Contact information: [place] [address] [place] [facility] [place] [facility] Follow up.   Specialty: Obstetrics and Gynecology Contact information: [place] [address] [phone]       [facility], [facility] Follow up.   Specialty: Home Health  Services Why: Registered Nurse-office to call with visit times Contact information: [place] [address] [phone]              No Known Allergies    The results of significant diagnostics from this hospitalization (including imaging, microbiology, ancillary and laboratory) are listed below for reference.    Labs: BNP (last 3 results) No results for input(s): BNP in the last 8760 hours. Basic Metabolic Panel: Recent Labs  Lab 11/10/20 0056 11/11/20 0032 11/13/20 0101 11/14/20 0508 11/15/20 1016  NA 136 135 133* 136 135  K 4.0 4.1 4.2 3.5 3.4*  CL 110 105 103 103 103  CO2 18* 23 24 25 25   GLUCOSE 116* 91 98 90 83  BUN 11 11 17 16 16   CREATININE 1.86* 1.73* 2.01* 1.72* 1.59*  CALCIUM 8.4* 8.3* 8.5* 8.6* 8.9  MG  --   --  1.5* 1.6*  --   PHOS  --   --  4.2 4.4  --    Liver Function Tests: Recent Labs  Lab 11/13/20 0101 11/14/20 0508  AST 38 33  ALT 47* 41  ALKPHOS 59 54  BILITOT 0.6 0.6  PROT 7.1 7.0  ALBUMIN 3.1* 3.1*   No results for input(s): LIPASE, AMYLASE in the last 168 hours. No results for input(s): AMMONIA in the last 168 hours. CBC: Recent Labs  Lab 11/09/20 0043 11/10/20 0056 11/11/20 0032 11/13/20 0101 11/14/20 0508  WBC 3.5* 3.7* 3.6* 4.0 3.3*  NEUTROABS  --   --   --  2.0 2.3  HGB 7.4* 7.9* 8.1* 8.1* 8.0*  HCT 23.5* 25.3* 25.7* 25.9* 25.2*  MCV 76.8* 78.3* 77.9* 78.7* 78.0*  PLT 178 189 211 212 203   Cardiac Enzymes: No results for input(s): CKTOTAL, CKMB, CKMBINDEX, TROPONINI in the last 168 hours. BNP: Invalid input(s): POCBNP CBG: No results for input(s): GLUCAP in the last 168 hours. D-Dimer No results for input(s): DDIMER in the last 72 hours. Hgb A1c No results for input(s): HGBA1C in the last 72 hours. Lipid Profile No results for input(s): CHOL, HDL, LDLCALC, TRIG, CHOLHDL, LDLDIRECT in the last 72 hours. Thyroid function studies No results for input(s): TSH, T4TOTAL, T3FREE, THYROIDAB  in the last 72 hours.  Invalid input(s): FREET3 Anemia work up No results for input(s): VITAMINB12, FOLATE, FERRITIN, TIBC, IRON, RETICCTPCT in the last 72 hours. Urinalysis    Component Value Date/Time   COLORURINE STRAW (A) 11/06/2020 1530   APPEARANCEUR HAZY (A) 11/06/2020 1530   LABSPEC 1.008 11/06/2020 1530   PHURINE 6.0 11/06/2020 1530   GLUCOSEU NEGATIVE 11/06/2020 1530   HGBUR LARGE (A) 11/06/2020 1530   BILIRUBINUR NEGATIVE 11/06/2020 1530   KETONESUR NEGATIVE 11/06/2020 1530   PROTEINUR NEGATIVE 11/06/2020 1530   NITRITE NEGATIVE 11/06/2020 1530   LEUKOCYTESUR LARGE (A) 11/06/2020 1530   Sepsis Labs Invalid input(s): PROCALCITONIN,  WBC,  LACTICIDVEN Microbiology Recent Results (from the past 240 hour(s))  Urine culture     Status: Abnormal   Collection Time: 11/05/20 11:03 PM   Specimen: Urine, Random  Result Value Ref Range Status   Specimen Description URINE, RANDOM  Final   Special Requests NONE  Final   Culture (A)  Final    <10,000 COLONIES/mL INSIGNIFICANT GROWTH Performed at [facility], [address] [place], [place] [address]    Report Status 11/07/2020 FINAL  Final  SARS CORONAVIRUS 2 (TAT 6-24 HRS) Nasopharyngeal Nasopharyngeal Swab     Status: None   Collection Time: 11/06/20 12:14 AM   Specimen: Nasopharyngeal Swab  Result Value Ref Range Status   SARS Coronavirus 2 NEGATIVE NEGATIVE Final    Comment: (NOTE) SARS-CoV-2 target nucleic acids are NOT DETECTED.  The SARS-CoV-2 RNA is generally detectable in upper and lower respiratory specimens during the acute phase of infection. Negative results do not preclude SARS-CoV-2 infection, do not rule out co-infections with other pathogens, and should not be used as the sole basis for treatment or other patient management decisions. Negative results must be combined with clinical observations, patient history, and epidemiological information. The expected result is Negative.  Fact Sheet for  Patients: SugarRoll.be  Fact Sheet for Healthcare Providers: https://www.woods-mathews.com/  This test is not yet approved or cleared by the [place] FDA and  has been authorized for detection and/or diagnosis of SARS-CoV-2 by FDA under an Emergency Use Authorization (  EUA). This EUA will remain  in effect (meaning this test can be used) for the duration of the COVID-19 declaration under Se ction 564(b)(1) of the Act, 21 U.S.C. section 360bbb-3(b)(1), unless the authorization is terminated or revoked sooner.  Performed at [facility], [place] [address] [place], [place] [address]   Aerobic/Anaerobic Culture w Gram Stain (surgical/deep wound)     Status: None   Collection Time: 11/07/20 10:10 AM   Specimen: Abscess  Result Value Ref Range Status   Specimen Description ABSCESS  Final   Special Requests PELVIC RIGHT  Final   Gram Stain   Final    ABUNDANT WBC PRESENT, PREDOMINANTLY PMN NO ORGANISMS SEEN    Culture   Final    No growth aerobically or anaerobically. Performed at [facility], [place] [address], [place],  [address]    Report Status 11/12/2020 FINAL  Final  Acid Fast Smear (AFB)     Status: None   Collection Time: 11/07/20 10:10 AM   Specimen: Perirectal; Abscess  Result Value Ref Range Status   AFB Specimen Processing Concentration  Final   Acid Fast Smear Negative  Final    Comment: (NOTE) Performed At: [facility] [place], [place] [id number] Rush Farmer MD [id number]    Source (AFB) PELVIC RIGHT  Final    Comment: Performed at [facility], [place] [address], [place], [place] [address]  Acid Fast Smear (AFB)     Status: None   Collection Time: 11/11/20  7:28 PM   Specimen: Vein; Blood  Result Value Ref Range Status   AFB Specimen Processing Direct Inoculation  Final   Acid Fast Smear Negative  Final    Comment: (NOTE) Performed At: [facility] [place], [place] [id number] Rush Farmer MD IP:[id number]     Procedures/Studies: CT ABDOMEN PELVIS WO CONTRAST  Result Date: 11/05/2020 CLINICAL DATA:  Back and flank pain, renal failure, nausea and vomiting for 2 days, dysuria and EXAM: CT ABDOMEN AND PELVIS WITHOUT CONTRAST TECHNIQUE: Multidetector CT imaging of the abdomen and pelvis was performed following the standard protocol without IV contrast. COMPARISON:  CT 10/17/2020 MR 10/02/2020 FINDINGS: Motion degradation of the lung bases and upper abdomen likely related to respiratory motion artifact. Lower chest: Lung bases appear grossly clear. Borderline cardiomegaly. Hypoattenuation of the cardiac blood pool often reflective of anemia. Trace pericardial effusion. Hepatobiliary: No visible focal liver lesion. Smooth liver surface contour. Normal hepatic attenuation. Gallbladder largely decompressed at the time of exam. Gross gallbladder abnormality is seen. No visible calcified gallstones. No discernible biliary ductal dilatation though evaluation limited upper motion artifact. Pancreas: No discernible pancreatic mass or lesion. No peripancreatic stranding. No visible ductal dilatation. Spleen: Normal in size. No concerning splenic lesions. Adrenals/Urinary Tract: Adrenal glands are difficult to fully discern in the absence of contrast media given extensive motion artifact. Some layering hyperdensity is seen within the proximal collecting system bilaterally. Given symmetry of this finding, could reflect excreted contrast media versus is milk of calcium. A relatively brush like appearance argues against discrete urolithiasis though may be accentuated by motion artifact. Mild to moderate bilateral hydroureteronephrosis. Visible distal ureteral calculi. Urinary bladder is largely decompressed at the time of exam and therefore poorly evaluated by CT imaging. Stomach/Bowel: Challenging assessment the bowel in the absence of contrast media or  enteric contrast. No gross acute abnormality is seen of the distal thoracic esophagus, stomach or duodenum. Multiple fluid-filled loops of small bowel  are present without gross distention. No discernible bowel wall thickening. Moderate to large colonic stool burden. Discrete colonic wall thickening dilatation or dilatation. No CT features of high-grade obstruction. Vascular/Lymphatic: Limited assessment of the vasculature in the absence of contrast media no aneurysm or ectasia is evident. Numerous pelvic phleboliths. Reproductive: Markedly enlarged, heterogeneous uterus compatible the numerous large fibroid seen on comparison CT and MR imaging, better characterized on those exams. No gross interval change from comparison prior. No discrete uterine masses are observable within the limitations of this unenhanced exam. Other: Small fluid attenuation along the right pelvic floor appears to have reaccumulated in the interim from comparison prior CT following removal of a drainage catheter on the CT images dated 10/17/2020. This collection measures 4.4 x 2.2 x 4.3 cm. Some mild surrounding stranding is nonspecific. Musculoskeletal: No acute osseous abnormality or suspicious osseous lesion. Stable chronic angulation of the sacrococcygeal junction. 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 Minimal degenerative changes the spine, hips pelvis. IMPRESSION: 1. Evaluation is complicated by an absence of intravenous contrast media, extensive respiratory motion artifact and a paucity of intraperitoneal fat, all of which may contribute to limited detection of subtle abnormalities. 2. Reaccumulation of the thick-walled collection along the right pelvic floor following removal of a drainage catheter seen on comparison CT imaging concerning for recurrent perirectal abscess. 3. Markedly enlarged, heterogeneous uterus with multiple dural mass is suspicious for uterine fibroids though underlying leiomyosarcoma is not fully excluded.  Findings are better detailed on comparison MR imaging. 4. Persistent and slightly increased mild to moderate hydroureteronephrosis at least to the level of the markedly enlarged uterus, possibly secondary to mass effect. Can certainly result in some diminished renal function. Increased attenuation dependently within the collecting systems could reflect excreted contrast media versus milk of calcium or urolithiasis with motion artifact. 5. Hypoattenuation of the cardiac blood pool, often reflective of anemia. 6. Trace pericardial effusion. Electronically Signed   By: Lovena Le M.D.   On: 11/05/2020 22:19   CT PELVIS W CONTRAST  Result Date: 10/17/2020 CLINICAL DATA:  Status post CT-guided percutaneous catheter drainage of right-sided perirectal abscess on 10/07/2020. There is decrease output from the drain and reflux of fluid from the catheter exit site with flushing. EXAM: CT PELVIS WITH CONTRAST TECHNIQUE: Multidetector CT imaging of the pelvis was performed using the standard protocol following the bolus administration of intravenous contrast. CONTRAST:  73mL OMNIPAQUE IOHEXOL 300 MG/ML  SOLN COMPARISON:  10/07/2020 FINDINGS: Urinary Tract:  No abnormality visualized. Bowel: Unremarkable visualized pelvic bowel loops. No free intraperitoneal air. Vascular/Lymphatic: No pathologically enlarged lymph nodes. No significant vascular abnormality seen. Reproductive: Stable uterine enlargement from multiple underlying fibroids. Other: Right-sided perirectal drainage catheter remains present with resolution of perirectal abscess. No new fluid collections. No abnormal fluid in the pelvis. Musculoskeletal: No suspicious bone lesions identified. IMPRESSION: Resolution of right perirectal abscess after catheter drainage. Electronically Signed   By: Aletta Edouard M.D.   On: 10/17/2020 16:04   MR BRAIN WO CONTRAST  Result Date: 11/08/2020 CLINICAL DATA:  Follow-up examination for intracranial toxoplasmosis in the  setting of HIV/AIDS, confirmed by brain biopsy. EXAM: MRI HEAD WITHOUT CONTRAST TECHNIQUE: Multiplanar, multiecho pulse sequences of the brain and surrounding structures were obtained without intravenous contrast. COMPARISON:  Prior MRI from 10/01/2019 FINDINGS: Brain: Previously seen 2 ring-enhancing lesions involving the left frontal operculum and left temporal lobe are markedly improved as compared to previous exam, consistent with interval response to therapy. A small area of mild residual T2/FLAIR  signal abnormality measuring up to 2.6 cm now seen at the level of the left frontal region (series 11, image 15). Small amount of associated T1 hyperintensity and susceptibility artifact, which could reflect blood products. Additional fairly mild residual FLAIR signal abnormality seen at the left temporal lesion as well (series 11, image 8). This lesion was the site of biopsy, with postsurgical changes seen within the overlying scalp and calvarium. 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 Previously seen vasogenic edema and mass effect is markedly improved, with no significant regional mass effect now seen. No midline shift. No other new lesions or other signal abnormality identified. No evidence for acute or subacute infarct. No other evidence for acute or chronic intracranial hemorrhage. 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 No other mass lesion or mass effect. No extra-axial fluid collection. Pituitary gland suprasellar region normal. Midline structures intact. Vascular: Major intracranial vascular flow voids are maintained. Skull and upper cervical spine: Craniocervical junction normal. Diffusely decreased T1 signal intensity throughout the visualized bone marrow, likely related to anemia. Postoperative changes present at the left temporal region.  Sinuses/Orbits: Globes and orbital soft tissues demonstrate no acute finding. Chronic frontoethmoidal and maxillary sinusitis noted, similar to previous. No significant mastoid effusion. Inner ear structures grossly normal. Other: None. IMPRESSION: 1. Marked interval improvement in appearance of the previously identified two ring-enhancing lesions involving the left cerebral hemisphere, consistent with interval response to therapy. Residual mild localized signal change and edema at these locations without significant regional mass effect. 2. Sequelae of prior brain biopsy at the left temporal lesion, with associated small dural defect and meningoencephalocele. 3. No other new intracranial lesions or other abnormality identified. 4. Chronic frontoethmoidal and maxillary sinusitis, similar to previous. Electronically Signed   By: Jeannine Boga M.D.   On: 11/08/2020 04:14   US RENAL  Result Date: 11/13/2020 CLINICAL DATA:  HIV, hydronephrosis EXAM: RENAL / URINARY TRACT ULTRASOUND COMPLETE COMPARISON:  11/05/2020 FINDINGS: Right Kidney: Renal measurements: 12.9 x 5.7 x 7.0 cm = volume: 264 mL. Increased renal echogenicity with preserved cortical thickness. Similar very mild hydronephrosis. Left Kidney: Renal measurements: 13.6 x 6.8 x 6.8 cm = volume: 328 mL. Increased renal echogenicity. Preserved normal cortical thickness. Very minor hydronephrosis. Bladder: Within normal limits for degree of distension. Enlarged uterus presumed related to fibroids. Other: No free fluid. IMPRESSION: Increased renal echogenicity compatible with medical renal disease. Similar minor bilateral hydronephrosis, possibly related to mass effect from the large fibroid uterus. Electronically Signed   By: Jerilynn Mages.  Shick M.D.   On: 11/13/2020 11:39   DG CHEST PORT 1 VIEW  Result Date: 11/12/2020 CLINICAL DATA:  Shortness of breath EXAM: PORTABLE CHEST 1 VIEW COMPARISON:  CT 09/29/2020 FINDINGS: Borderline to mild cardiomegaly. No focal  opacity or pleural effusion. No pneumothorax. IMPRESSION: No active disease.  Borderline cardiomegaly Electronically Signed   By: Donavan Foil M.D.   On: 11/12/2020 19:27   CT IMAGE GUIDED DRAINAGE BY PERCUTANEOUS CATHETER  Result Date: 11/07/2020 INDICATION: 40 year old female with history of right perirectal abscess status post percutaneous drainage catheter placement 10/07/2020 which was subsequently removed earlier this month. Presents with recurrent pain secondary to reaccumulation of abscess. EXAM: CT IMAGE GUIDED DRAINAGE BY PERCUTANEOUS CATHETER COMPARISON:  None. MEDICATIONS: The patient is currently admitted to the hospital and receiving intravenous antibiotics. The antibiotics were administered within an appropriate time frame  prior to the initiation of the procedure. ANESTHESIA/SEDATION: Moderate (conscious) sedation was employed during this procedure. A total of Versed 3 mg and Fentanyl 100 mcg was administered intravenously. Moderate Sedation Time: 10 minutes. The patient's level of consciousness and vital signs were monitored continuously by radiology nursing throughout the procedure under my direct supervision. CONTRAST:  None COMPLICATIONS: None immediate. PROCEDURE: Informed written consent was obtained from the patient after a discussion of the risks, benefits and alternatives to treatment. The patient was placed prone on the CT gantry and a pre procedural CT was performed re-demonstrating the known abscess/fluid collection within the right sub gluteal, perirectal space. The procedure was planned. A timeout was performed prior to the initiation of the procedure. The medial aspect of the right gluteal region was prepped and draped in the usual sterile fashion. The overlying soft tissues were anesthetized with 1% lidocaine with epinephrine. Appropriate trajectory was planned with the use of a 22 gauge spinal needle. An 18 gauge trocar needle was advanced into the abscess/fluid collection and a  short Amplatz super stiff wire was coiled within the collection. Appropriate positioning was confirmed with a limited CT scan. The tract was serially dilated allowing placement of a 10.2 [place] all-purpose drainage catheter. Appropriate positioning was confirmed with a limited postprocedural CT scan. Approximately 20 ml of purulent fluid was aspirated. The tube was connected to a bulb suction and sutured in place. A dressing was placed. The patient tolerated the procedure well without immediate post procedural complication. IMPRESSION: Successful CT guided placement of a 10.2 [place] all purpose drain catheter into the right perirectal abscess with aspiration of approximately 20 mL of purulent fluid. Samples were sent to the laboratory as requested by the ordering clinical team. Ruthann Cancer, MD [facility] Radiology Electronically Signed   By: Ruthann Cancer MD   On: 11/07/2020 13:47   IR Radiologist Eval & Mgmt  Result Date: 11/13/2020 There is no Radiologist interpretation  for this exam.    Time coordinating discharge: Over 30 minutes  SIGNED:   Guilford Shi, MD  [facility] 11/15/2020, 6:43 PM

## 2020-11-17 ENCOUNTER — Other Ambulatory Visit: Payer: Self-pay | Admitting: Surgery

## 2020-11-17 DIAGNOSIS — K611 Rectal abscess: Secondary | ICD-10-CM

## 2020-11-18 ENCOUNTER — Other Ambulatory Visit (HOSPITAL_COMMUNITY): Payer: Self-pay

## 2020-11-18 LAB — ACID FAST CULTURE WITH REFLEXED SENSITIVITIES (MYCOBACTERIA): Acid Fast Culture: NEGATIVE

## 2020-11-19 ENCOUNTER — Encounter: Payer: Self-pay | Admitting: Infectious Diseases

## 2020-11-19 NOTE — Progress Notes (Signed)
Patient ID: Candace Jones, female   DOB: 09/25/1980, 40 y.o.   MRN: [id number]    Candace Jones, is a 40 y.o. female  QPR:[id number]  LDJ:[id number]  DOB - Dec 21, 1980  Subjective:  Chief Complaint and HPI: Candace Jones is a 40 y.o. female here today to establish care and for a follow up visit.  She is doing well on her medication regimen change.  She saw ID this morning.  She denies any new issues or concerns and is able to verbalize her other upcoming appts to me.  After hospitalization 5/24-11/15/2020 Discharge Diagnoses:  Principal Problem:   AKI (acute kidney injury) ([place]) Active Problems:   Microcytic hypochromic anemia   AIDS (acquired immune deficiency syndrome) ([place])   Perirectal abscess   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HCC)   Hyperkalemia   Pelvic abscess in female   Heartburn   Episodic paroxysmal hemicrania, not intractable   Blurry vision   Obstructive uropathy   Hydronephrosis     Hospital Course Summary:39 y.o.  French-speaking female with medical history significant of AIDS, toxoplasma meningoencephalitis who was started on [place] recently for AIDS, but follow-up lab work revealed that the patient had a significant increase and BUN/creatinine and decrease in GFR.  She was subsequently sent to the ED for further evaluation and treatment.  ED Course: Initial vital signs temperature 98.5 F, pulse 84, respiration 17, BP 131/83 mmHg O2 sat 100% on room air.  The patient received a 1000 mL NS bolus.  Lab work: Urinalysis had interference of urine pigment.  CBC shows a white count of 3.3, hemoglobin 8.2 g/dL and platelets 204.  Sodium 135, potassium 5.2, chloride 107 and CO2 21 mmol/L.  Glucose 100, BUN 26 and creatinine 4.14 mg/dL. CT abdomen without contrast showed patient has reaccumulation of thick-walled collection along the right pelvic floor following removal of a drainage catheter concerning for recurrent perirectal abscess.  Persistent and slightly increased mild to moderate  hydronephrosis at least to the level of the markedly enlarged uterus, possibly secondary to [facility] course: Patient admitted for further evaluation of AKI.  She received Lokelma in the ED for hyperkalemia and admitted with IV fluids.  Biktarvy was held and infectious diseases, GYN-urology consulted for AKI possibly caused by medications versus obstructive uropathy due to uterine mass.   AKI: Felt possibly tenofovir induced tubular damage or obstructive uropathy due to fibroids.  Renal function seems to have plateaued around 1.7-2.0 range over the last few days-further improved to 1.6 today.  Seen by nephrology as well as GYN urology.  Advised avoid nephrotoxic agents (was taking NSAIDs)-seen by infectious diseases Dr. Drucilla Schmidt and HIV medications changed.   Fibroids uterus with bilateral hydronephrosis: GYN-urology evaluated for possible hysterectomy versus nephrostomy decompression-Dr. Rip Harbour recommended outpatient follow-up and appointment being set up by them.    Multifactorial anemia: Due to blood loss from hematuria/menorrhagia-but also felt to have anemia of chronic disease due to underlying HIV.  Hemoglobin overall stable-recommendations are to continue ART-and follow-up in the outpatient setting.   Recurrent perirectal abscess: Prior drain was removed on 5/6-unfortunately reoccurred-General surgery/IR following-on clindamycin-perirectal drain placed by IR on 5/27.  Cultures unrevealing so far.  Per last radiology note from 6/2 stated--"if patient were to be disharged before the drain can be removed in the hospital, our clinic will call patient to set up a follow up appointment for CT scan and possible drain injection at the clinic 1-2 weeks after the discharge. Out patient follow up orders in".   Out patient discharge  instruction for the drain and dressing:    Patient to flush the drain with 5 cc normal saline daily Record output daily (subtract 5 cc that was used to flush the drain  from the total daily output)  Dressing change as needed and at least once a week.    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  MRI this admit with significant radiological improvement.   Seizure disorder: Continue Keppra   HIV/AIDS: On ART-ID following.  On dapsone for PCP prophylaxis.  Biktarvy discontinued and replaced by new medication Dovato--which was obtained and also pillbox arranged with the help of ID pharmacy on 6/2.  Please see ID note from 6/3 for details regarding medication changes.   Hypokalemia/hypomagnesemia: Repleted during hospital stay and recheck labs show magnesium 1.7, potassium 3.4.  Additional potassium replacement given prior to discharge and p.o. magnesium prescription given for 5 days.    ED/Hospital notes reviewed.     ROS:   Constitutional:  No f/c, No night sweats, No unexplained weight loss. EENT:  No vision changes, No blurry vision, No hearing changes. No mouth, throat, or ear problems.  Respiratory: No cough, No SOB Cardiac: No CP, no palpitations GI:  No abd pain, No N/V/D. GU: No Urinary s/sx Musculoskeletal: No joint pain Neuro: No headache, no dizziness, no motor weakness.  Skin: No rash Endocrine:  No polydipsia. No polyuria.  Psych: Denies SI/HI  No problems updated.  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 Authorizing Provider  clindamycin (CLEOCIN) 300 MG capsule Take 2 capsules (600 mg total) by mouth 4 (four) times daily. Accidentally sent to [facility] location. Will be picked up this evening. Thanks! 11/12/20   Kuppelweiser, Cassie L, RPH-CPP  dapsone 100 MG tablet Take 1 tablet (100 mg total) by mouth daily. 11/12/20 02/10/21   Elodia Florence., MD  docusate sodium (COLACE) 100 MG capsule Take 1 capsule (100 mg total) by mouth 2 (two) times daily. 10/10/20   Bonnielee Haff, MD  Dolutegravir-lamiVUDine (DOVATO) 50-300 MG TABS Take 1 tablet by mouth daily. 11/15/20   Guilford Shi, MD  ferrous sulfate 325 (65 FE) MG tablet Take 1 tablet (325 mg total) by mouth daily with breakfast. 10/10/20 01/08/21  [place] Donnamarie Poag, DO  leucovorin (WELLCOVORIN) 25 MG tablet Take 1 tablet (25 mg total) by mouth daily. 11/06/20   Kuppelweiser, Cassie L, RPH-CPP  levETIRAcetam (KEPPRA) 100 MG/ML solution Take 7.5 mLs (750 mg total) by mouth 2 (two) times daily. 11/13/20   Elodia Florence., MD  magnesium oxide (MAG-OX) 400 (240 Mg) MG tablet Take 1 tablet (400 mg total) by mouth 2 (two) times daily. 11/15/20   Guilford Shi, MD  omeprazole (PRILOSEC) 20 MG capsule Take 1 capsule (20 mg total) by mouth daily. 11/06/20   Kuppelweiser, Cassie L, RPH-CPP  promethazine (PHENERGAN) 12.5 MG tablet Take 1 tablet (12.5 mg total) by mouth every 6 (six) hours as needed for nausea or vomiting. 10/31/20   Kuppelweiser, Cassie L, RPH-CPP  pyrimethamine (DARAPRIM) 25 MG tablet Take 3 tablets (75 mg total) by mouth daily with breakfast. 11/07/20   Kuppelweiser, Cassie L, RPH-CPP     Objective:  EXAM:   Vitals:   11/20/20 1358  BP:  121/83  Pulse: 97  Resp: 15  Temp: (!) 97 F (36.1 C)  SpO2: 98%  Weight: 163 lb (73.9 kg)  Height: 5' 8.4" (1.737 m)    General appearance : A&OX3. NAD. Non-toxic-appearing HEENT: Atraumatic and Normocephalic.  PERRLA. EOM intact.  Chest/Lungs:  Breathing-non-labored, Good air entry bilaterally, breath sounds normal without rales, rhonchi, or wheezing  CVS: S1 S2 regular, no murmurs, gallops, rubs  Extremities: Bilateral Lower Ext shows no edema, both legs are warm to touch with = pulse throughout Neurology:  CN II-XII grossly intact, Non focal.   Psych:  TP linear. J/I WNL. Normal speech. Appropriate eye  contact and affect.  Skin:  No Rash  Data Review No results found for: HGBA1C   Assessment & Plan   1. AKI (acute kidney injury) ([place]) ID ordered labs today  2. Microcytic hypochromic anemia -CBC ordered  3. Perirectal abscess Doing well/has follow up scheduled  4. Hydronephrosis, unspecified hydronephrosis type Followed by nephrology  5. AIDS (acquired immune deficiency syndrome) ([place]) Followed by ID  6. Language barrier [facility]  Vicente Males) interpreters used and additional time performing visit was required.   7. Hospital discharge follow-up Does not need RF from [place]  Patient have been counseled extensively about nutrition and exercise  Return for Her appt with Juluis Mire 7/8 should be to establish care-not hospital follow up.  The patient was given clear instructions to go to ER or return to medical center if symptoms don't improve, worsen or new problems develop. The patient verbalized understanding. The patient was told to call to get lab results if they haven't heard anything in the next week.     Freeman Caldron, PA-C [facility] and [facility], [place]   11/20/2020, 4:25 PM

## 2020-11-20 ENCOUNTER — Other Ambulatory Visit: Payer: Self-pay

## 2020-11-20 ENCOUNTER — Encounter (INDEPENDENT_AMBULATORY_CARE_PROVIDER_SITE_OTHER): Payer: Self-pay

## 2020-11-20 ENCOUNTER — Ambulatory Visit (INDEPENDENT_AMBULATORY_CARE_PROVIDER_SITE_OTHER): Payer: Self-pay | Admitting: Infectious Diseases

## 2020-11-20 ENCOUNTER — Encounter: Payer: Self-pay | Admitting: Physician Assistant

## 2020-11-20 ENCOUNTER — Ambulatory Visit: Payer: BLUE CROSS/BLUE SHIELD | Attending: Physician Assistant | Admitting: Physician Assistant

## 2020-11-20 ENCOUNTER — Encounter: Payer: Self-pay | Admitting: Infectious Diseases

## 2020-11-20 VITALS — BP 110/77 | HR 93 | Temp 97.7°F | Wt 162.4 lb

## 2020-11-20 VITALS — BP 121/83 | HR 97 | Temp 97.0°F | Resp 15 | Ht 68.4 in | Wt 163.0 lb

## 2020-11-20 DIAGNOSIS — D509 Iron deficiency anemia, unspecified: Secondary | ICD-10-CM

## 2020-11-20 DIAGNOSIS — K611 Rectal abscess: Secondary | ICD-10-CM | POA: Diagnosis not present

## 2020-11-20 DIAGNOSIS — Z09 Encounter for follow-up examination after completed treatment for conditions other than malignant neoplasm: Secondary | ICD-10-CM | POA: Diagnosis not present

## 2020-11-20 DIAGNOSIS — N179 Acute kidney failure, unspecified: Secondary | ICD-10-CM | POA: Diagnosis not present

## 2020-11-20 DIAGNOSIS — N858 Other specified noninflammatory disorders of uterus: Secondary | ICD-10-CM

## 2020-11-20 DIAGNOSIS — R112 Nausea with vomiting, unspecified: Secondary | ICD-10-CM

## 2020-11-20 DIAGNOSIS — B20 Human immunodeficiency virus [HIV] disease: Secondary | ICD-10-CM

## 2020-11-20 DIAGNOSIS — N1330 Unspecified hydronephrosis: Secondary | ICD-10-CM

## 2020-11-20 DIAGNOSIS — Z603 Acculturation difficulty: Secondary | ICD-10-CM

## 2020-11-20 DIAGNOSIS — B582 Toxoplasma meningoencephalitis: Secondary | ICD-10-CM

## 2020-11-20 DIAGNOSIS — H538 Other visual disturbances: Secondary | ICD-10-CM

## 2020-11-20 DIAGNOSIS — Z789 Other specified health status: Secondary | ICD-10-CM

## 2020-11-20 LAB — BASIC METABOLIC PANEL
BUN/Creatinine Ratio: 13 (calc) (ref 6–22)
BUN: 16 mg/dL (ref 7–25)
CO2: 25 mmol/L (ref 20–32)
Calcium: 9.2 mg/dL (ref 8.6–10.2)
Chloride: 103 mmol/L (ref 98–110)
Creat: 1.21 mg/dL — ABNORMAL HIGH (ref 0.50–1.10)
Glucose, Bld: 76 mg/dL (ref 65–99)
Potassium: 3.9 mmol/L (ref 3.5–5.3)
Sodium: 135 mmol/L (ref 135–146)

## 2020-11-20 NOTE — Assessment & Plan Note (Signed)
Resolved

## 2020-11-20 NOTE — Assessment & Plan Note (Signed)
This recurred despite drainage last month. She has replaced drain in currently. Milky white/yellow drainage that is thin appearing. System seems to be flushing well and no pain.  No growth on cultures. Will continue with clindamycin 300 mg TID for now to cover for this until drain study 6/21 to assess for possible fistula.  Gen surgery follow up will probably need to be coordinated with [facility] given she is uninsured.

## 2020-11-20 NOTE — Patient Instructions (Signed)
Please come back early next week to see our pharmacist to teach you about your new medications.   Start on Thursday noon dose in your tray today.

## 2020-11-20 NOTE — Progress Notes (Signed)
Hospitalization f/u

## 2020-11-20 NOTE — Assessment & Plan Note (Signed)
Thought more likely to be due to obstructive pathology from uterine masses. She has follow up with GYN 6/14 for discussions about hysterectomy. She is committed to this if it is best to help prevent kidney damage.  Will repeat BMP today to trend creatinine. Will continue tenofovir sparing regimen with Dovato.

## 2020-11-20 NOTE — Assessment & Plan Note (Signed)
She has completed 6 week induction therapy. Will need to pick a secondary prophylaxis option for her - will see what kidney function shows. Simplest regimen would probably be bactrim DS BID, especially considering she did not tolerate sulfadizine.  May need to stick with clinda based regimen however if kidney function not back to baesline.

## 2020-11-20 NOTE — Progress Notes (Signed)
Name: Candace Jones  DOB: [birth year] MRN: [id number] PCP: Patient, No Pcp Per (Inactive)    Brief Narrative:  Candace Jones is a 40 y.o. female with HIV, AIDS+ at Dx 09/2020 Risk: sexual, endemic area History of OIs: CNS Toxo (Tx started 4/22) Intake Labs 09-2020: Hep B sAg (-), sAb (+), cAb (-); Hep A (+), Hep C (-) Quantiferon () HLA B*5701 () G6PD: (normal) Toxo IgG: (+)    Previous Regimens: Biktarvy 09/2020  Genotypes: 09/2020 - hospitalized and not performed.   Subjective:   Chief Complaint  Patient presents with   Follow-up    Follow up   abscess, , pt has drainage tube        HPI: Since discharge from the hospital she feels much better. Her stomach pain is gone. She is eating normally and feeling better. She is voiding normally. No vaginal bleeding. No headaches, no vision changes.  She is hopeful she can add back some salt to her diet - even just a little bit.   She does have all her medications with her and pill tray today. She does need help with a letter for her daughter and her work and something stating she cannot drive.   She is still having some low volume output from gluteal drain. No pain. Functioning/flushing well.    Review of Systems  Constitutional:  Negative for chills and fever.  HENT:  Negative for tinnitus.   Eyes:  Negative for blurred vision and photophobia.  Respiratory:  Negative for cough and sputum production.   Cardiovascular:  Negative for chest pain.  Gastrointestinal:  Negative for diarrhea, nausea and vomiting.  Genitourinary:  Negative for dysuria.  Skin:  Negative for rash.  Neurological:  Negative for headaches.     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 Accidentally sent to [facility] location. Will be picked up this evening. Thanks! 240 capsule 0   dapsone 100 MG tablet Take 1 tablet (100 mg total) by mouth daily. 30 tablet 2   docusate sodium (COLACE) 100 MG capsule Take 1 capsule (100 mg total) by mouth 2 (two) times daily. 30 capsule 0   Dolutegravir-lamiVUDine (DOVATO) 50-300 MG TABS Take 1 tablet by mouth daily. 30 tablet 3   ferrous sulfate 325 (65 FE) MG tablet Take 1 tablet (325 mg total) by mouth daily with breakfast. 90 tablet 0   leucovorin (WELLCOVORIN) 25 MG tablet Take 1 tablet (25 mg total) by mouth daily. 30 tablet 0   levETIRAcetam (KEPPRA) 100 MG/ML solution Take 7.5 mLs (750 mg total) by mouth 2 (two) times daily. 473 mL 0   magnesium oxide (MAG-OX) 400 (240 Mg) MG tablet Take 1 tablet (400 mg total) by mouth 2 (two) times daily. 10 tablet 0   omeprazole (PRILOSEC) 20 MG capsule Take 1 capsule (20 mg total) by mouth daily. 30 capsule 0   promethazine (PHENERGAN) 12.5 MG tablet Take 1 tablet (12.5 mg total) by mouth every 6 (six) hours as needed for nausea or vomiting. 60 tablet 1   pyrimethamine (DARAPRIM) 25 MG tablet Take 3 tablets (75 mg total) by mouth daily with breakfast. 90 tablet 0   No facility-administered medications prior to visit.  No Known Allergies  Social History   Tobacco Use   Smoking status: Never   Smokeless tobacco: Never  Substance Use Topics   Alcohol use: Never   Drug use: Never    Family History  Family history unknown: Yes    Social History   Substance and Sexual Activity  Sexual Activity Yes     Objective:   Vitals:   11/20/20 0934  BP: 110/77  Pulse: 93  Temp: 97.7 F (36.5 C)  SpO2: 97%  Weight: 162 lb 6.4 oz (73.7 kg)   Body mass index is 24.69 kg/m.  Physical Exam Vitals reviewed.  Constitutional:      Appearance: She is well-developed.     Comments: Appears uncomfortable.   HENT:     Mouth/Throat:     Mouth: No oral lesions.      Dentition: Normal dentition. No dental abscesses.     Pharynx: No oropharyngeal exudate.  Cardiovascular:     Rate and Rhythm: Normal rate and regular rhythm.     Heart sounds: Normal heart sounds.  Pulmonary:     Effort: Pulmonary effort is normal.     Breath sounds: Normal breath sounds.  Abdominal:     General: There is no distension.     Palpations: Abdomen is soft.     Tenderness: There is abdominal tenderness.  Lymphadenopathy:     Cervical: No cervical adenopathy.  Skin:    General: Skin is warm and dry.     Capillary Refill: Capillary refill takes less than 2 seconds.     Findings: No rash.  Neurological:     Mental Status: She is alert and oriented to person, place, and time.  Psychiatric:        Judgment: Judgment normal.     Lab Results Lab Results  Component Value Date   WBC 3.3 (L) 11/14/2020   HGB 8.0 (L) 11/14/2020   HCT 25.2 (L) 11/14/2020   MCV 78.0 (L) 11/14/2020   PLT 203 11/14/2020    Lab Results  Component Value Date   CREATININE 1.59 (H) 11/15/2020   BUN 16 11/15/2020   NA 135 11/15/2020   K 3.4 (L) 11/15/2020   CL 103 11/15/2020   CO2 25 11/15/2020    Lab Results  Component Value Date   ALT 41 11/14/2020   AST 33 11/14/2020   ALKPHOS 54 11/14/2020   BILITOT 0.6 11/14/2020    No results found for: CHOL, HDL, LDLCALC, LDLDIRECT, TRIG, CHOLHDL HIV 1 RNA Quant (copies/mL)  Date Value  11/06/2020 100  09/30/2020 28,100     Assessment & Plan:   Problem List Items Addressed This Visit       High   AIDS (acquired immune deficiency syndrome) ([place]) - Primary    Maintenance therapy for toxoplasmosis pending BMP today. If we can get her on bactrim 1 ds bid she does not need dapsone any further for PJP proph.  No other findings concerning for OI on exam today.  Continue Dovato once daily - I took care to separate this from iron pill to where there is no concern for chelation effect.   FU in 1 week with pharmacy team to help re-load her  pill box given complexity of medical regimen and language barrier. I think we will have enough information to commit her to maintenance prophylaxis at that time.           Unprioritized   Uterine mass    FU with GYN 6/14 - most  likely multiple fibroids but imaging mentions concern for possible leiomyosarcoma        Perirectal abscess    This recurred despite drainage last month. She has replaced drain in currently. Milky white/yellow drainage that is thin appearing. System seems to be flushing well and no pain.  No growth on cultures. Will continue with clindamycin 300 mg TID for now to cover for this until drain study 6/21 to assess for possible fistula.  Gen surgery follow up will probably need to be coordinated with [facility] given she is uninsured.        Nausea & vomiting    All GI symptoms have resolved since stopping sulfadiazine. She has PRN phenergan if needed.  I advised she can add a little bit of salt back to her diet at this time.        CNS toxoplasmosis [facility]    She has completed 6 week induction therapy. Will need to pick a secondary prophylaxis option for her - will see what kidney function shows. Simplest regimen would probably be bactrim DS BID, especially considering she did not tolerate sulfadizine.  May need to stick with clinda based regimen however if kidney function not back to baesline.        Blurry vision    Resolved.        AKI (acute kidney injury) ([place])    Thought more likely to be due to obstructive pathology from uterine masses. She has follow up with GYN 6/14 for discussions about hysterectomy. She is committed to this if it is best to help prevent kidney damage.  Will repeat BMP today to trend creatinine. Will continue tenofovir sparing regimen with Dovato.        Relevant Orders   Basic metabolic panel    Janene Madeira, MSN, NP-C [place] [facility] [place] Pager: [phone] Office:  [phone]  11/20/20  3:18 PM

## 2020-11-20 NOTE — Assessment & Plan Note (Signed)
All GI symptoms have resolved since stopping sulfadiazine. She has PRN phenergan if needed.  I advised she can add a little bit of salt back to her diet at this time.

## 2020-11-20 NOTE — Assessment & Plan Note (Signed)
FU with GYN 6/14 - most likely multiple fibroids but imaging mentions concern for possible leiomyosarcoma

## 2020-11-20 NOTE — Assessment & Plan Note (Signed)
Maintenance therapy for toxoplasmosis pending BMP today. If we can get her on bactrim 1 ds bid she does not need dapsone any further for PJP proph.  No other findings concerning for OI on exam today.  Continue Dovato once daily - I took care to separate this from iron pill to where there is no concern for chelation effect.   FU in 1 week with pharmacy team to help re-load her pill box given complexity of medical regimen and language barrier. I think we will have enough information to commit her to maintenance prophylaxis at that time.

## 2020-11-21 ENCOUNTER — Telehealth: Payer: Self-pay | Admitting: Infectious Diseases

## 2020-11-21 DIAGNOSIS — B582 Toxoplasma meningoencephalitis: Secondary | ICD-10-CM

## 2020-11-21 MED ORDER — SULFAMETHOXAZOLE-TRIMETHOPRIM 800-160 MG PO TABS: 1.0000 | ORAL_TABLET | Freq: Two times a day (BID) | ORAL | 5 refills | Status: DC

## 2020-11-21 MED ORDER — DOCUSATE SODIUM 100 MG PO CAPS: 100.0000 mg | ORAL_CAPSULE | Freq: Two times a day (BID) | ORAL | 2 refills | Status: DC

## 2020-11-21 MED ORDER — CLINDAMYCIN HCL 300 MG PO CAPS: 600.0000 mg | ORAL_CAPSULE | Freq: Three times a day (TID) | ORAL | 0 refills | Status: DC

## 2020-11-21 NOTE — Telephone Encounter (Signed)
Lab Results  Component Value Date   CREATININE 1.21 (H) 11/20/2020    Creatinine much improved - will start bactrim 1 DS tab BID for toxo maintenance therapy. I spoke with her and her sister over the phone about her new prescription today.   She can STOP dapsone as well (we pulled this from her pill tray yesterday when it was reset).   Continue clindamycin 300 mg TID until drain comes out.   Needs repeat BMP 10-14 days after this change (Cassie will help coordinate).   They will bring all medications next week so we can teach them how to set the pill tray for future fills.

## 2020-11-25 ENCOUNTER — Ambulatory Visit (INDEPENDENT_AMBULATORY_CARE_PROVIDER_SITE_OTHER): Payer: BLUE CROSS/BLUE SHIELD | Admitting: Family Medicine

## 2020-11-25 ENCOUNTER — Encounter: Payer: Self-pay | Admitting: Family Medicine

## 2020-11-25 ENCOUNTER — Other Ambulatory Visit: Payer: Self-pay

## 2020-11-25 ENCOUNTER — Other Ambulatory Visit (HOSPITAL_COMMUNITY)
Admission: RE | Admit: 2020-11-25 | Discharge: 2020-11-25 | Disposition: A | Discharge status: Home / Self Care | Payer: Medicaid Other | Source: Ambulatory Visit | Attending: Family Medicine | Admitting: Family Medicine

## 2020-11-25 VITALS — BP 120/80 | HR 73 | Ht 65.0 in | Wt 167.1 lb

## 2020-11-25 DIAGNOSIS — Z1231 Encounter for screening mammogram for malignant neoplasm of breast: Secondary | ICD-10-CM

## 2020-11-25 DIAGNOSIS — Z113 Encounter for screening for infections with a predominantly sexual mode of transmission: Secondary | ICD-10-CM | POA: Insufficient documentation

## 2020-11-25 DIAGNOSIS — Z124 Encounter for screening for malignant neoplasm of cervix: Secondary | ICD-10-CM | POA: Insufficient documentation

## 2020-11-25 DIAGNOSIS — D259 Leiomyoma of uterus, unspecified: Secondary | ICD-10-CM

## 2020-11-25 NOTE — Patient Instructions (Signed)
Preventive Care 21-41 Years Old, Female Preventive care refers to lifestyle choices and visits with your health care provider that can promote health and wellness. This includes: A yearly physical exam. This is also called an annual wellness visit. Regular dental and eye exams. Immunizations. Screening for certain conditions. Healthy lifestyle choices, such as: Eating a healthy diet. Getting regular exercise. Not using drugs or products that contain nicotine and tobacco. Limiting alcohol use. What can I expect for my preventive care visit? Physical exam Your health care provider may check your: Height and weight. These may be used to calculate your BMI (body mass index). BMI is a measurement that tells if you are at a healthy weight. Heart rate and blood pressure. Body temperature. Skin for abnormal spots. Counseling Your health care provider may ask you questions about your: Past medical problems. Family's medical history. Alcohol, tobacco, and drug use. Emotional well-being. Home life and relationship well-being. Sexual activity. Diet, exercise, and sleep habits. Work and work environment. Access to firearms. Method of birth control. Menstrual cycle. Pregnancy history. What immunizations do I need?  Vaccines are usually given at various ages, according to a schedule. Your health care provider will recommend vaccines for you based on your age, medicalhistory, and lifestyle or other factors, such as travel or where you work. What tests do I need?  Blood tests Lipid and cholesterol levels. These may be checked every 5 years starting at age 20. Hepatitis C test. Hepatitis B test. Screening Diabetes screening. This is done by checking your blood sugar (glucose) after you have not eaten for a while (fasting). STD (sexually transmitted disease) testing, if you are at risk. BRCA-related cancer screening. This may be done if you have a family history of breast, ovarian, tubal, or  peritoneal cancers. Pelvic exam and Pap test. This may be done every 3 years starting at age 21. Starting at age 30, this may be done every 5 years if you have a Pap test in combination with an HPV test. Talk with your health care provider about your test results, treatment options,and if necessary, the need for more tests. Follow these instructions at home: Eating and drinking  Eat a healthy diet that includes fresh fruits and vegetables, whole grains, lean protein, and low-fat dairy products. Take vitamin and mineral supplements as recommended by your health care provider. Do not drink alcohol if: Your health care provider tells you not to drink. You are pregnant, may be pregnant, or are planning to become pregnant. If you drink alcohol: Limit how much you have to 0-1 drink a day. Be aware of how much alcohol is in your drink. In the [place] one drink equals one 12 oz bottle of beer (355 mL), one 5 oz glass of wine (148 mL), or one 1 oz glass of hard liquor (44 mL).  Lifestyle Take daily care of your teeth and gums. Brush your teeth every morning and night with fluoride toothpaste. Floss one time each day. Stay active. Exercise for at least 30 minutes 5 or more days each week. Do not use any products that contain nicotine or tobacco, such as cigarettes, e-cigarettes, and chewing tobacco. If you need help quitting, ask your health care provider. Do not use drugs. If you are sexually active, practice safe sex. Use a condom or other form of protection to prevent STIs (sexually transmitted infections). If you do not wish to become pregnant, use a form of birth control. If you plan to become pregnant, see your health care   provider for a prepregnancy visit. Find healthy ways to cope with stress, such as: Meditation, yoga, or listening to music. Journaling. Talking to a trusted person. Spending time with friends and family. Safety Always wear your seat belt while driving or riding in a  vehicle. Do not drive: If you have been drinking alcohol. Do not ride with someone who has been drinking. When you are tired or distracted. While texting. Wear a helmet and other protective equipment during sports activities. If you have firearms in your house, make sure you follow all gun safety procedures. Seek help if you have been physically or sexually abused. What's next? Go to your health care provider once a year for an annual wellness visit. Ask your health care provider how often you should have your eyes and teeth checked. Stay up to date on all vaccines. This information is not intended to replace advice given to you by your health care provider. Make sure you discuss any questions you have with your healthcare provider. Document Revised: 01/27/2020 Document Reviewed: 02/09/2018 Elsevier Patient Education  2022 [employer].

## 2020-11-25 NOTE — Progress Notes (Signed)
Pt advised that she is not having actual abnormal bleeding, it's just that when she was given specific meds in ED it caused her to have blot clots during one of her cycles.

## 2020-11-25 NOTE — Assessment & Plan Note (Signed)
Patient presenting for discussion of fibroid uterus and management options. All imaging and prior admission notes reviewed in depth. On exam uterus is notably enlarged, however patient reports normal menses and no other symptoms secondary to her uterine fibroids, so I do not recommend surgical evaluation at this time. In addition her creatinine is improving so it is unlikely given timeline that this was the cause of her AKI. Follow up PRN if she develops new symptoms.  Patient due for pap, collected today. Also ordered for mammogram.

## 2020-11-25 NOTE — Progress Notes (Signed)
GYNECOLOGY OFFICE VISIT NOTE  History:   Candace Jones is a 40 y.o. G2P0 here today for hospital follow up.  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 Unclear etiology for AKI, tenofovir induced vs obstructive uropathy secondary to multi fibroid uterus. She was scheduled for follow up to discuss options for her multifibroid uterus.  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    Past Surgical History:  Procedure Laterality Date   APPLICATION OF CRANIAL NAVIGATION Left 10/02/2020   Procedure: APPLICATION OF CRANIAL NAVIGATION;  Surgeon: Newman Pies, MD;  Location: [facility];  Service: Neurosurgery;  Laterality: Left;   CRANIOTOMY Left 10/02/2020   Procedure: LEFT CRANIOTOMY FOR TUMOR BIOPSY/ RESECTION with BrainLab;  Surgeon: Newman Pies, MD;  Location: [facility];  Service: Neurosurgery;  Laterality: Left;   IR RADIOLOGIST EVAL & MGMT  10/17/2020    The following portions of the patient's history were reviewed and updated as appropriate: allergies, current medications, past family history, past medical history, past social history, past surgical history and problem list.   Health Maintenance:   Last pap: No results found for: DIAGPAP, HPV, HPVHIGH  Last mammogram:  Never done   Review of Systems:  Pertinent  items noted in HPI and remainder of comprehensive ROS otherwise negative.  Physical Exam:  BP 120/80   Pulse 73   Ht 5\' 5"  (1.651 m)   Wt 167 lb 1.6 oz (75.8 kg)   LMP 11/05/2020 (Approximate)   BMI 27.81 kg/m  CONSTITUTIONAL: Well-developed, well-nourished female in no acute distress.  HEENT:  Normocephalic, atraumatic. External right and left ear normal. No scleral icterus.  NECK: Normal range of motion, supple, no masses noted on observation SKIN: No rash noted. Not diaphoretic. No erythema. No pallor. MUSCULOSKELETAL: Normal range of motion. No edema noted. NEUROLOGIC: Alert and oriented to person, place, and time. Normal muscle tone coordination.  PSYCHIATRIC: Normal mood and affect. Normal behavior. Normal judgment and thought content. RESPIRATORY: Effort normal, no problems with respiration noted ABDOMEN: No tenderness to palpation. Large lower abdominal mass. PELVIC: Normal appearing external genitalia; normal appearing vaginal mucosa and cervix.  No abnormal discharge noted. Uterus approximately 20 wks size, firm and nodular. Small nodule palpated on L aspect of cervical os.   Labs and Imaging Results for orders placed or performed in visit on 11/20/20 (from the past 168 hour(s))  Basic metabolic panel   Collection Time: 11/20/20 11:08 AM  Result Value Ref Range   Glucose, Bld 76 65 - 99 mg/dL   BUN 16 7 - 25 mg/dL   Creat 1.21 (H) 0.50 - 1.10 mg/dL   BUN/Creatinine Ratio 13 6 - 22 (calc)   Sodium 135 135 - 146 mmol/L   Potassium 3.9 3.5 - 5.3 mmol/L   Chloride 103 98 - 110 mmol/L   CO2  25 20 - 32 mmol/L   Calcium 9.2 8.6 - 10.2 mg/dL   CT ABDOMEN PELVIS WO CONTRAST  Result Date: 11/05/2020 CLINICAL DATA:  Back and flank pain, renal failure, nausea and vomiting for 2 days, dysuria and EXAM: CT ABDOMEN AND PELVIS WITHOUT CONTRAST TECHNIQUE: Multidetector CT imaging of the abdomen and pelvis was performed following the standard protocol without IV contrast. COMPARISON:   CT 10/17/2020 MR 10/02/2020 FINDINGS: Motion degradation of the lung bases and upper abdomen likely related to respiratory motion artifact. Lower chest: Lung bases appear grossly clear. Borderline cardiomegaly. Hypoattenuation of the cardiac blood pool often reflective of anemia. Trace pericardial effusion. Hepatobiliary: No visible focal liver lesion. Smooth liver surface contour. Normal hepatic attenuation. Gallbladder largely decompressed at the time of exam. Gross gallbladder abnormality is seen. No visible calcified gallstones. No discernible biliary ductal dilatation though evaluation limited upper motion artifact. Pancreas: No discernible pancreatic mass or lesion. No peripancreatic stranding. No visible ductal dilatation. Spleen: Normal in size. No concerning splenic lesions. Adrenals/Urinary Tract: Adrenal glands are difficult to fully discern in the absence of contrast media given extensive motion artifact. Some layering hyperdensity is seen within the proximal collecting system bilaterally. Given symmetry of this finding, could reflect excreted contrast media versus is milk of calcium. A relatively brush like appearance argues against discrete urolithiasis though may be accentuated by motion artifact. Mild to moderate bilateral hydroureteronephrosis. Visible distal ureteral calculi. Urinary bladder is largely decompressed at the time of exam and therefore poorly evaluated by CT imaging. Stomach/Bowel: Challenging assessment the bowel in the absence of contrast media or enteric contrast. No gross acute abnormality is seen of the distal thoracic esophagus, stomach or duodenum. Multiple fluid-filled loops of small bowel are present without gross distention. No discernible bowel wall thickening. Moderate to large colonic stool burden. Discrete colonic wall thickening dilatation or dilatation. No CT features of high-grade obstruction. Vascular/Lymphatic: Limited assessment of the vasculature in the absence of  contrast media no aneurysm or ectasia is evident. Numerous pelvic phleboliths. Reproductive: Markedly enlarged, heterogeneous uterus compatible the numerous large fibroid seen on comparison CT and MR imaging, better characterized on those exams. No gross interval change from comparison prior. No discrete uterine masses are observable within the limitations of this unenhanced exam. Other: Small fluid attenuation along the right pelvic floor appears to have reaccumulated in the interim from comparison prior CT following removal of a drainage catheter on the CT images dated 10/17/2020. This collection measures 4.4 x 2.2 x 4.3 cm. Some mild surrounding stranding is nonspecific. Musculoskeletal: No acute osseous abnormality or suspicious osseous lesion. Stable chronic angulation of the sacrococcygeal junction. 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 Minimal degenerative changes the spine, hips pelvis. IMPRESSION: 1. Evaluation is complicated by an absence of intravenous contrast media, extensive respiratory motion artifact and a paucity of intraperitoneal fat, all of which may contribute to limited detection of subtle abnormalities. 2. Reaccumulation of the thick-walled collection along the right pelvic floor following removal of a drainage catheter seen on comparison CT imaging concerning for recurrent perirectal abscess. 3. Markedly enlarged, heterogeneous uterus with multiple dural mass is suspicious for uterine fibroids though underlying leiomyosarcoma is not fully excluded. Findings are better detailed on comparison MR imaging. 4. Persistent and slightly increased mild to moderate hydroureteronephrosis at least to the level of the markedly enlarged uterus, possibly secondary to mass effect. Can certainly result in some diminished renal function. Increased attenuation dependently within the collecting systems could reflect excreted contrast media versus milk  of calcium or urolithiasis with motion artifact. 5.  Hypoattenuation of the cardiac blood pool, often reflective of anemia. 6. Trace pericardial effusion. Electronically Signed   By: Lovena Le M.D.   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 EXAM: MRI HEAD WITHOUT CONTRAST TECHNIQUE: Multiplanar, multiecho pulse sequences of the brain and surrounding structures were obtained without intravenous contrast. COMPARISON:  Prior MRI from 10/01/2019 FINDINGS: Brain: Previously seen 2 ring-enhancing lesions involving the left frontal operculum and left temporal lobe are markedly improved as compared to previous exam, consistent with interval response to therapy. A small area of mild residual T2/FLAIR signal abnormality measuring up to 2.6 cm now seen at the level of the left frontal region (series 11, image 15). Small amount of associated T1 hyperintensity and susceptibility artifact, which could reflect blood products. Additional fairly mild residual FLAIR signal abnormality seen at the left temporal lesion as well (series 11, image 8). This lesion was the site of biopsy, with postsurgical changes seen within the overlying scalp and calvarium. 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 Previously seen vasogenic edema and mass effect is markedly improved, with no significant regional mass effect now seen. No midline shift. No other new lesions or other signal abnormality identified. No evidence for acute or subacute infarct. No other evidence for acute or chronic intracranial hemorrhage. 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 No other mass lesion or mass effect. No extra-axial fluid collection. Pituitary gland suprasellar region normal. Midline structures intact.  Vascular: Major intracranial vascular flow voids are maintained. Skull and upper cervical spine: Craniocervical junction normal. Diffusely decreased T1 signal intensity throughout the visualized bone marrow, likely related to anemia. Postoperative changes present at the left temporal region. Sinuses/Orbits: Globes and orbital soft tissues demonstrate no acute finding. Chronic frontoethmoidal and maxillary sinusitis noted, similar to previous. No significant mastoid effusion. Inner ear structures grossly normal. Other: None. IMPRESSION: 1. Marked interval improvement in appearance of the previously identified two ring-enhancing lesions involving the left cerebral hemisphere, consistent with interval response to therapy. Residual mild localized signal change and edema at these locations without significant regional mass effect. 2. Sequelae of prior brain biopsy at the left temporal lesion, with associated small dural defect and meningoencephalocele. 3. No other new intracranial lesions or other abnormality identified. 4. Chronic frontoethmoidal and maxillary sinusitis, similar to previous. Electronically Signed   By: Jeannine Boga M.D.   On: 11/08/2020 04:14   US RENAL  Result Date: 11/13/2020 CLINICAL DATA:  HIV, hydronephrosis EXAM: RENAL / URINARY TRACT ULTRASOUND COMPLETE COMPARISON:  11/05/2020 FINDINGS: Right Kidney: Renal measurements: 12.9 x 5.7 x 7.0 cm = volume: 264 mL. Increased renal echogenicity with preserved cortical thickness. Similar very mild hydronephrosis. Left Kidney: Renal measurements: 13.6 x 6.8 x 6.8 cm = volume: 328 mL. Increased renal echogenicity. Preserved normal cortical thickness. Very minor hydronephrosis. Bladder: Within normal limits for degree of distension. Enlarged uterus presumed related to fibroids. Other: No free fluid. IMPRESSION: Increased renal echogenicity compatible with medical renal disease. Similar minor bilateral hydronephrosis, possibly related to mass effect  from the large fibroid uterus. Electronically Signed   By: Jerilynn Mages.  Shick M.D.   On: 11/13/2020 11:39   DG CHEST PORT 1 VIEW  Result Date:  11/12/2020 CLINICAL DATA:  Shortness of breath EXAM: PORTABLE CHEST 1 VIEW COMPARISON:  CT 09/29/2020 FINDINGS: Borderline to mild cardiomegaly. No focal opacity or pleural effusion. No pneumothorax. IMPRESSION: No active disease.  Borderline cardiomegaly Electronically Signed   By: Donavan Foil M.D.   On: 11/12/2020 19:27   CT IMAGE GUIDED DRAINAGE BY PERCUTANEOUS CATHETER  Result Date: 11/07/2020 INDICATION: 40 year old female with history of right perirectal abscess status post percutaneous drainage catheter placement 10/07/2020 which was subsequently removed earlier this month. Presents with recurrent pain secondary to reaccumulation of abscess. EXAM: CT IMAGE GUIDED DRAINAGE BY PERCUTANEOUS CATHETER COMPARISON:  None. MEDICATIONS: The patient is currently admitted to the hospital and receiving intravenous antibiotics. The antibiotics were administered within an appropriate time frame prior to the initiation of the procedure. ANESTHESIA/SEDATION: Moderate (conscious) sedation was employed during this procedure. A total of Versed 3 mg and Fentanyl 100 mcg was administered intravenously. Moderate Sedation Time: 10 minutes. The patient's level of consciousness and vital signs were monitored continuously by radiology nursing throughout the procedure under my direct supervision. CONTRAST:  None COMPLICATIONS: None immediate. PROCEDURE: Informed written consent was obtained from the patient after a discussion of the risks, benefits and alternatives to treatment. The patient was placed prone on the CT gantry and a pre procedural CT was performed re-demonstrating the known abscess/fluid collection within the right sub gluteal, perirectal space. The procedure was planned. A timeout was performed prior to the initiation of the procedure. The medial aspect of the right gluteal region  was prepped and draped in the usual sterile fashion. The overlying soft tissues were anesthetized with 1% lidocaine with epinephrine. Appropriate trajectory was planned with the use of a 22 gauge spinal needle. An 18 gauge trocar needle was advanced into the abscess/fluid collection and a short Amplatz super stiff wire was coiled within the collection. Appropriate positioning was confirmed with a limited CT scan. The tract was serially dilated allowing placement of a 10.2 [place] all-purpose drainage catheter. Appropriate positioning was confirmed with a limited postprocedural CT scan. Approximately 20 ml of purulent fluid was aspirated. The tube was connected to a bulb suction and sutured in place. A dressing was placed. The patient tolerated the procedure well without immediate post procedural complication. IMPRESSION: Successful CT guided placement of a 10.2 [place] all purpose drain catheter into the right perirectal abscess with aspiration of approximately 20 mL of purulent fluid. Samples were sent to the laboratory as requested by the ordering clinical team. Ruthann Cancer, MD [facility] Electronically Signed   By: Ruthann Cancer MD   On: 11/07/2020 13:47      Assessment and Plan:   Problem List Items Addressed This Visit       Genitourinary   Fibroid uterus    Patient presenting for discussion of fibroid uterus and management options. All imaging and prior admission notes reviewed in depth. On exam uterus is notably enlarged, however patient reports normal menses and no other symptoms secondary to her uterine fibroids, so I do not recommend surgical evaluation at this time. In addition her creatinine is improving so it is unlikely given timeline that this was the cause of her AKI. Follow up PRN if she develops new symptoms.  Patient due for pap, collected today. Also ordered for mammogram.       Other Visit Diagnoses     Screening for  malignant neoplasm of cervix    -  Primary   Relevant Orders   Cytology -  PAP( [place])   Screening examination for sexually transmitted disease       Relevant Orders   Cytology - PAP( [place])   Encounter for screening mammogram for malignant neoplasm of breast       Relevant Orders   MM Digital Screening       Routine preventative health maintenance measures emphasized. Please refer to After Visit Summary for other counseling recommendations.   Return if symptoms worsen or fail to improve.    Total face-to-face time with patient: 25 minutes.  Over 50% of encounter was spent on counseling and coordination of care.   Clarnce Flock, MD/[facility] [employer], [place]

## 2020-11-26 ENCOUNTER — Ambulatory Visit: Payer: Self-pay | Attending: Critical Care Medicine

## 2020-11-27 ENCOUNTER — Ambulatory Visit: Payer: Self-pay | Admitting: Pharmacist

## 2020-11-27 ENCOUNTER — Other Ambulatory Visit: Payer: Self-pay

## 2020-11-27 DIAGNOSIS — B582 Toxoplasma meningoencephalitis: Secondary | ICD-10-CM

## 2020-11-27 DIAGNOSIS — B20 Human immunodeficiency virus [HIV] disease: Secondary | ICD-10-CM

## 2020-11-27 LAB — CYTOLOGY - PAP
Adequacy: ABSENT
Chlamydia: NEGATIVE
Comment: NEGATIVE
Comment: NEGATIVE
Comment: NEGATIVE
Comment: NORMAL
Diagnosis: NEGATIVE
High risk HPV: NEGATIVE
Neisseria Gonorrhea: NEGATIVE
Trichomonas: NEGATIVE

## 2020-11-27 NOTE — Progress Notes (Signed)
HPI: Candace Jones is a 40 y.o. female who presents to the [place] clinic for HIV follow-up.  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   DOCUSATE SODIUM (COLACE) 100 MG CAPSULE    Take 1 capsule (100 mg total) by mouth 2 (two) times daily.   DOLUTEGRAVIR-LAMIVUDINE (DOVATO) 50-300 MG TABS    Take 1 tablet by mouth daily.   FERROUS SULFATE 325 (65 FE) MG TABLET    Take 1 tablet (325 mg total) by mouth daily with breakfast.   LEVETIRACETAM (KEPPRA) 100 MG/ML SOLUTION    Take 7.5 mLs (750 mg total) by mouth 2 (two) times daily.   MAGNESIUM OXIDE (MAG-OX) 400 (240 MG) MG TABLET    Take 1 tablet (400 mg total) by mouth 2 (two) times daily.   OMEPRAZOLE (PRILOSEC) 20 MG CAPSULE    Take 1 capsule (20 mg total) by mouth daily.   PROMETHAZINE (PHENERGAN) 12.5 MG TABLET    Take 1 tablet (12.5 mg total) by mouth every 6 (six) hours as needed for nausea or vomiting.   SULFAMETHOXAZOLE-TRIMETHOPRIM (BACTRIM DS) 800-160 MG TABLET    Take 1 tablet by mouth 2 (two) times daily.  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    Social History: Social History   Socioeconomic History   Marital status: Single    Spouse name: Not on file   Number of children: Not on file   Years of education: Not on file   Highest education level: Not on file  Occupational History   Not on file  Tobacco Use   Smoking status: Never   Smokeless tobacco: Never  Substance and Sexual Activity   Alcohol use: Never   Drug use: Never   Sexual activity: Yes    Birth control/protection: None  Other Topics Concern   Not on file  Social History Narrative   Not on file   Social Determinants of Health   Financial Resource Strain: Not on file  Food Insecurity: Not on file  Transportation Needs: Not on file  Physical Activity: Not on file  Stress: Not on file  Social Connections: Not on file    Labs: Lab Results  Component Value Date   HIV1RNAQUANT 100 11/06/2020   HIV1RNAQUANT 28,100 09/30/2020    RPR and STI Lab Results  Component Value Date   LABRPR Reactive (A) 09/30/2020    No flowsheet data found.  Hepatitis B Lab Results  Component Value Date   HEPBSAG NON REACTIVE 10/08/2020   HEPBCAB NON  REACTIVE 10/08/2020   Hepatitis C No results found for: HEPCAB, HCVRNAPCRQN Hepatitis A Lab Results  Component Value Date   HAV Reactive (A) 10/08/2020   Lipids: No results found for: CHOL, TRIG, HDL, CHOLHDL, VLDL, LDLCALC  Current HIV Regimen: Dovato  Assessment: Candace Jones is here today accompanied by her sister for HIV and CNS toxoplasmosis follow up. She has completed induction therapy with pyrazinamide, leucovorin, and sulfadiazine and has transitioned to Bactrim BID for suppression therapy. Candace Jones talked to her sister on the phone and advised her t pick up the Bactrim at [facility], but she has not done this.   I filled her pill box with Dovato, clindamycin, Keppra, iron, and omeprazole. She is no longer having abdominal pain or issues. She does complain of occasional  headaches, but they are a 1 on a scale of 1-10; nothing like when she was initially admitted to the hospital.   She was advised to pick up Bactrim at [facility] today and her sister feels confident in filling her pill box with this (twice daily). She will continue the clindamycin until her drain is removed on Tuesday and possibly beyond that. Will follow up after that to see how long she needs to continue. I will see her next week for adherence check, pill box refill, and to check her kidney function on Bactrim.  Plan: - Filled pillbox - Pick up bactrim - F/u with me again next Thursday  Haleem Hanner L. Marla Pouliot, PharmD, BCIDP, AAHIVP, CPP Clinical Pharmacist Practitioner Infectious Diseases [place] [facility] 11/27/2020, 2:21 PM

## 2020-11-28 ENCOUNTER — Telehealth: Payer: Self-pay

## 2020-11-28 NOTE — Telephone Encounter (Signed)
Called Pt using [employer] id# [id number] to advise of Negative PAP, Pt verbalized understanding.

## 2020-11-28 NOTE — Telephone Encounter (Signed)
-----   Message from Clarnce Flock, MD sent at 11/27/2020  5:25 PM EDT ----- Normal pap, please let patient know, needs french interpreter.

## 2020-12-02 ENCOUNTER — Ambulatory Visit
Admission: RE | Admit: 2020-12-02 | Discharge: 2020-12-02 | Disposition: A | Discharge status: Home / Self Care | Payer: No Typology Code available for payment source | Source: Ambulatory Visit | Attending: Surgery | Admitting: Surgery

## 2020-12-02 ENCOUNTER — Other Ambulatory Visit: Payer: Self-pay | Admitting: Surgery

## 2020-12-02 ENCOUNTER — Ambulatory Visit
Admission: RE | Admit: 2020-12-02 | Discharge: 2020-12-02 | Disposition: A | Discharge status: Home / Self Care | Payer: No Typology Code available for payment source | Source: Ambulatory Visit | Attending: Student | Admitting: Student

## 2020-12-02 ENCOUNTER — Encounter: Payer: Self-pay | Admitting: *Deleted

## 2020-12-02 DIAGNOSIS — K611 Rectal abscess: Secondary | ICD-10-CM

## 2020-12-02 HISTORY — PX: IR RADIOLOGIST EVAL & MGMT: IMG5224

## 2020-12-02 MED ORDER — IOPAMIDOL (ISOVUE-300) INJECTION 61%: 80.0000 mL | Freq: Once | INTRAVENOUS | Status: DC | PRN

## 2020-12-02 NOTE — Telephone Encounter (Signed)
Error

## 2020-12-02 NOTE — Progress Notes (Addendum)
Referring Physician(s): Dr. Harlow Asa  Chief Complaint: The patient is seen in follow up today s/p perirectal abscess drain placed on 5.27.22  History of present illness:  40 y.o. [place] speaking female. History of HIV with toxoplasma encephalitis. AKI, anemia, and hyperkalemia. Presented to the ED at [facility] in April 2022 with abdominal pain, nausea and vomiting on 5.2.22. Patient was found to have perirectal cystic fluid collections. IR placed an perirectal abscess drain  on 4.26.22 (Cultures negative). CT scan showed the abscess resolved and at the drain was removed on 5.6.22. The patient presented to the ED At [facility] on 5.25.22 due to abnormal BUN, Cr and a decrease in GFR. CT abdomen pelvis from 5.25.22 reads Re accumulation of the thick-walled collection along the right pelvic floor following removal of a drainage catheter seen on comparison CT imaging concerning for recurrent perirectal abscess. On 5.27.22 IR placed a  10.2 APDL to right perirectal abscess with aspiration of 20 ml of purulent fluid. Cultures show no growth  Gram stain shows abundant BC  predominantly PMN. She is being followed by Infectious diseases weekly.  Per telephone encounter tfrom 6.10.22 Ms Stanbrough was prescribed Bactrim.  She presents today for follow-up CT scan and drain evaluation.  She is currently flushing the drain once daily with 5 mL of saline.  Output has been minimal, averaging less than 2 ml daily.  She currently denies fever, headache, chest pain, dyspnea, cough, abdominal pain, nausea, vomiting , back pain or bleeding.   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;  Service: Neurosurgery;  Laterality: Left;   CRANIOTOMY  Left 10/02/2020   Procedure: LEFT CRANIOTOMY FOR TUMOR BIOPSY/ RESECTION with BrainLab;  Surgeon: Newman Pies, MD;  Location: [place];  Service: Neurosurgery;  Laterality: Left;   IR RADIOLOGIST EVAL & MGMT  10/17/2020    Allergies: Patient has no known allergies.  Medications: Prior to Admission medications   Medication Sig Start Date End Date Taking? Authorizing Provider  clindamycin (CLEOCIN) 300 MG capsule Take 2 capsules (600 mg total) by mouth 3 (three) times daily. 11/21/20   Dade Callas, NP  docusate sodium (COLACE) 100 MG capsule Take 1 capsule (100 mg total) by mouth 2 (two) times daily. 11/21/20   [place] Callas, NP  Dolutegravir-lamiVUDine (DOVATO) 50-300 MG TABS Take 1 tablet by mouth daily. 11/15/20   [place] Shi, MD  ferrous sulfate 325 (65 FE) MG tablet Take 1 tablet (325 mg total) by mouth daily with breakfast. 10/10/20 01/08/21  [place] Donnamarie Poag, DO  levETIRAcetam (KEPPRA) 100 MG/ML solution Take 7.5 mLs (750 mg total) by mouth 2 (two) times daily. 11/13/20   Elodia Florence., MD  magnesium oxide (MAG-OX) 400 (240 Mg) MG tablet Take 1 tablet (400 mg total) by mouth 2 (two) times daily. 11/15/20   [place] Shi, MD  omeprazole (PRILOSEC) 20 MG capsule Take 1 capsule (20 mg total) by mouth daily. 11/06/20   Kuppelweiser, Cassie L, RPH-CPP  promethazine (PHENERGAN) 12.5 MG tablet Take 1 tablet (12.5 mg total) by mouth every 6 (six) hours as needed for nausea or vomiting. 10/31/20   Kuppelweiser, Cassie L, RPH-CPP  sulfamethoxazole-trimethoprim (BACTRIM DS) 800-160 MG tablet Take 1 tablet by mouth 2 (two)  times daily. 11/21/20   [place] Callas, NP     Family History  Family history unknown: Yes    Social History   Socioeconomic History   Marital status: Single    Spouse name: Not on file   Number of children: Not on file   Years of education: Not on file   Highest education level: Not on file  Occupational History   Not on file  Tobacco Use    Smoking status: Never   Smokeless tobacco: Never  Substance and Sexual Activity   Alcohol use: Never   Drug use: Never   Sexual activity: Yes    Birth control/protection: None  Other Topics Concern   Not on file  Social History Narrative   Not on file   Social Determinants of Health   Financial Resource Strain: Not on file  Food Insecurity: Not on file  Transportation Needs: Not on file  Physical Activity: Not on file  Stress: Not on file  Social Connections: Not on file     Vital Signs: LMP 11/05/2020 (Approximate)   Physical Exam Vitals and nursing note reviewed.  Constitutional:      Appearance: She is well-developed.  HENT:     Head: Normocephalic and atraumatic.  Eyes:     Conjunctiva/sclera: Conjunctivae normal.  Pulmonary:     Effort: Pulmonary effort is normal.  Abdominal:     Comments: Positive right transgluteal drain to suction. Ssite is unremarkable with no erythema, edema, tenderness, bleeding or drainage noted at exit site. Suture and stat lock in place. Dressing is clean dry and intact. 0 ml of  fluid noted in the JP drain.    Musculoskeletal:     Cervical back: Normal range of motion.  Neurological:     Mental Status: She is alert and oriented to person, place, and time.    Imaging: No results found.  Labs:  CBC: Recent Labs    11/10/20 0056 11/11/20 0032 11/13/20 0101 11/14/20 0508  WBC 3.7* 3.6* 4.0 3.3*  HGB 7.9* 8.1* 8.1* 8.0*  HCT 25.3* 25.7* 25.9* 25.2*  PLT 189 211 212 203    COAGS: Recent Labs    09/29/20 1814 11/07/20 0125  INR 1.1 1.1  APTT 23*  --     BMP: Recent Labs    11/11/20 0032 11/13/20 0101 11/14/20 0508 11/15/20 1016 11/20/20 1108  NA 135 133* 136 135 135  K 4.1 4.2 3.5 3.4* 3.9  CL 105 103 103 103 103  CO2 23 24 25 25 25   GLUCOSE 91 98 90 83 76  BUN 11 17 16 16 16   CALCIUM 8.3* 8.5* 8.6* 8.9 9.2  CREATININE 1.73* 2.01* 1.72* 1.59* 1.21*  GFRNONAA 38* 32* 38* 42*  --     LIVER FUNCTION  TESTS: Recent Labs    10/13/20 1622 10/31/20 1126 11/05/20 1725 11/13/20 0101 11/14/20 0508  BILITOT 0.3 0.3 0.2* 0.6 0.6  AST 34 35* 50* 38 33  ALT 40 39* 54* 47* 41  ALKPHOS 56  --  64 59 54  PROT 8.2* 7.2 7.5 7.1 7.0  ALBUMIN 3.5  --  3.4* 3.1* 3.1*    Assessment:  40 y.o. [place] speaking female. History of HIV with toxoplasma encephalitis. AKI, anemia, and hyperkalemia. Presented to the ED at [facility] in April 2022 with abdominal pain, nausea and vomiting on 5.2.22. Patient was found to have perirectal cystic fluid collections. IR placed an perirectal abscess drain  on 4.26.22 (Cultures negative). CT scan showed the  abscess resolved and at the drain was removed on 5.6.22. The patient presented to the ED At [facility] on 5.25.22 due to abnormal BUN, Cr and a decrease in GFR. CT abdomen pelvis from 5.25.22 reads Re accumulation of the thick-walled collection along the right pelvic floor following removal of a drainage catheter seen on comparison CT imaging concerning for recurrent perirectal abscess. On 5.27.22 IR placed a  10.2 APDL to right perirectal abscess with aspiration of 20 ml of purulent fluid. Cultures show no growth  Gram stain shows abundant BC  predominantly PMN.    She presents today with a [place] interpretor for a drain follow up.  She is currently stable and afebrile.  She is meeting infectious diseases weekly and continues to take Bactrim. She is currently flushing the drain once daily with 5 mL of saline.  Output has been minimal, averaging less than 2 ml daily.  The right transgluteal drain is  to suction. Site is unremarkable with no erythema, edema, tenderness, bleeding or drainage noted at exit site. Suture and stat lock in place. Dressing is clean dry and intact. 0 ml of  fluid noted in the JP drain.  Follow-up CT  pelvis done today reveals resolved anterior perirectal abscess with stable drain position.Case was reviewed by Dr. Kathlene Cote and decision made to remove abdominal drain  today.  The abdominal drain was removed in its entirety without immediate complications.  Gauze dressing applied to site.   Post-removal instructions: 1- Okay  to shower/sponge bath 24 hours post-removal. 2- No submerging (swimming, bathing) for 7 days post-removal. 3- Change dressing PRN until site fully healed.  All of the above was discussed with patient and using a [place] interpretor. Patient will continue follow-up care with primary physician and ID service.    Signed: Jacqualine Mau, NP 12/02/2020, 11:01 AM   Please refer to Dr. Kathlene Cote attestation of this note for management and plan.

## 2020-12-03 ENCOUNTER — Other Ambulatory Visit: Payer: Self-pay | Admitting: Pharmacist

## 2020-12-04 ENCOUNTER — Ambulatory Visit (INDEPENDENT_AMBULATORY_CARE_PROVIDER_SITE_OTHER): Payer: No Typology Code available for payment source | Admitting: Pharmacist

## 2020-12-04 ENCOUNTER — Other Ambulatory Visit: Payer: Self-pay

## 2020-12-04 DIAGNOSIS — B20 Human immunodeficiency virus [HIV] disease: Secondary | ICD-10-CM

## 2020-12-04 DIAGNOSIS — K219 Gastro-esophageal reflux disease without esophagitis: Secondary | ICD-10-CM | POA: Diagnosis not present

## 2020-12-04 DIAGNOSIS — N179 Acute kidney failure, unspecified: Secondary | ICD-10-CM

## 2020-12-04 DIAGNOSIS — B582 Toxoplasma meningoencephalitis: Secondary | ICD-10-CM

## 2020-12-04 MED ORDER — OMEPRAZOLE 20 MG PO CPDR: 20.0000 mg | DELAYED_RELEASE_CAPSULE | Freq: Every day | ORAL | 0 refills | Status: DC

## 2020-12-04 NOTE — Patient Instructions (Addendum)
How to fill your medication box every week: Omeprazole: AM Ferrous sulfate: AM Dovato: PM Sulfamethoxazole/trimethoprim: AM and PM Levetiracetam: AM and PM Docusate: AM and PM

## 2020-12-04 NOTE — Progress Notes (Signed)
HPI: Candace Jones is a 40 y.o. female who presents to the [place] clinic for HIV follow-up.  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   DOCUSATE SODIUM (COLACE) 100 MG CAPSULE    Take 1 capsule (100 mg total) by mouth 2 (two) times daily.   DOLUTEGRAVIR-LAMIVUDINE (DOVATO) 50-300 MG TABS    Take 1 tablet by mouth daily.   FERROUS SULFATE 325 (65 FE) MG TABLET    Take 1 tablet (325 mg total) by mouth daily with breakfast.   LEVETIRACETAM (KEPPRA) 100 MG/ML SOLUTION    Take 7.5 mLs (750 mg total) by mouth 2 (two) times daily.   MAGNESIUM OXIDE (MAG-OX) 400 (240 MG) MG TABLET    Take 1 tablet (400 mg total) by mouth 2 (two) times daily.   OMEPRAZOLE (PRILOSEC) 20 MG CAPSULE    Take 1 capsule (20 mg total) by mouth daily.   PROMETHAZINE (PHENERGAN) 12.5 MG TABLET    Take 1 tablet (12.5 mg total) by mouth every 6 (six) hours as needed for nausea or vomiting.   SULFAMETHOXAZOLE-TRIMETHOPRIM (BACTRIM DS) 800-160 MG TABLET    Take 1 tablet by mouth 2 (two) times daily.  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    Social History: Social History   Socioeconomic History   Marital status: Single    Spouse name: Not on file   Number of children: Not on file   Years of education: Not on file   Highest education level: Not on file  Occupational History   Not on file  Tobacco Use   Smoking status: Never   Smokeless tobacco: Never  Substance and Sexual Activity   Alcohol use: Never   Drug use: Never   Sexual activity: Yes    Birth control/protection: None  Other Topics Concern   Not on file  Social History Narrative   Not on file   Social Determinants of Health   Financial Resource Strain: Not on file  Food Insecurity: Not on file  Transportation Needs: Not on file  Physical Activity: Not on file  Stress: Not on file  Social Connections: Not on file    Labs: Lab Results  Component Value Date   HIV1RNAQUANT 100 11/06/2020   HIV1RNAQUANT 28,100 09/30/2020    RPR and STI Lab Results  Component Value Date   LABRPR Reactive (A) 09/30/2020    STI Results GC CT  11/25/2020 Negative Negative    Hepatitis B Lab Results  Component Value Date   HEPBSAG NON  REACTIVE 10/08/2020   HEPBCAB NON REACTIVE 10/08/2020   Hepatitis C No results found for: HEPCAB, HCVRNAPCRQN Hepatitis A Lab Results  Component Value Date   HAV Reactive (A) 10/08/2020   Lipids: No results found for: CHOL, TRIG, HDL, CHOLHDL, VLDL, LDLCALC  Current HIV Regimen: Dovato  Assessment: Candace Jones presents for follow up accompanied by a [place] interpreter for [place] to teach her how to fill her pill box and get labwork after starting Bactrim. She was able to pick up Bactrim from her pharmacy ([facility] on [address]) and started taking them. She says that when she takes Bactrim and Keppra together her stomach feels hard and hot. When she separates them by a couple hours she feels better. Told her that she could continue to take them a couple hours apart as long as she is taking  both of them twice daily. She confirms that she has been taking both twice daily. We will check a BMET today. Explained that it is important that she takes these medications.   She had her drain removed by radiology on 12/02/20 after CT pelvis showed resolved anterior perirectal abscess. She can now stop taking clindamycin. She wanted to keep the bottles so that she could take it when she starts to feel bad, but explained that that would do more harm than good and we want her to stop taking it. She then allowed me to keep the bottles to dispose of.   Explained how to fill pill boxes with each of her medications, wrote AM and/or PM on the pill bottles (since her pill box says AM and PM on each day's pocket) and on her AVS as below. She confirms understanding of what these mean and where to put them in the pill box.   Omeprazole: AM Ferrous sulfate: AM Dovato: PM Sulfamethoxazole/trimethoprim: AM and PM Levetiracetam: AM and PM Docusate: AM and PM  I showed her how to put the first medication in then she filled in the rest which she did correctly. She says she understands how to fill it now and feels comfortable doing it on her own next week. She asks how to get more medications and points to the bottles that are running low, asking [place] for more medications. I explained how refills at the pharmacy work. She can bring her bottles that are running low to her [facility] and ask them to refill them, which may be easier for her than calling. She allowed me to take the bottles of all the medications that she is no longer taking so that all the medications she has at home are current and to be put in the pill box each week.   Plan: -BMET, HIV viral load, CD4 count -Stop taking clindamycin -Refilled omeprazole -Explained how to refill medications at the pharmacy, could likely use re-education at next visit -F/u visit with [name] 7/5 9:30am. She will need refills of her iron if she is to continue taking  this.    Rebbeca Paul, PharmD PGY1 Pharmacy Resident 12/04/2020 9:00 AM

## 2020-12-08 ENCOUNTER — Telehealth: Payer: Self-pay

## 2020-12-08 LAB — BASIC METABOLIC PANEL
BUN: 15 mg/dL (ref 7–25)
CO2: 27 mmol/L (ref 20–32)
Calcium: 8.6 mg/dL (ref 8.6–10.2)
Chloride: 105 mmol/L (ref 98–110)
Creat: 1.1 mg/dL (ref 0.50–1.10)
Glucose, Bld: 78 mg/dL (ref 65–139)
Potassium: 4.1 mmol/L (ref 3.5–5.3)
Sodium: 138 mmol/L (ref 135–146)

## 2020-12-08 LAB — HIV-1 RNA QUANT-NO REFLEX-BLD
HIV 1 RNA Quant: <20 Copies/mL — ABNORMAL HIGH
HIV-1 RNA Quant, Log: <1.3 Log cps/mL — ABNORMAL HIGH

## 2020-12-08 NOTE — Progress Notes (Signed)
Kidney function looks great.

## 2020-12-08 NOTE — Telephone Encounter (Signed)
Received call from [facility] pharmacist wanting to clarify orders as patient has filled [place] on 10/17/20 and 11/18/20 and has filled Dovato on 11/12/20 and 12/05/20.   Per notes, patient is to be on Dovato. Relayed this to [facility], they will cancel Biktarvy prescription.   [facility] will also notate that patient is [place] speaking and have interpreter services assist with future interactions.   Beryle Flock, RN

## 2020-12-08 NOTE — Telephone Encounter (Signed)
Yes, that is correct! She is only taking Dovato. Biktarvy was stopped when she was hospitalized a few weeks ago. Thank you!

## 2020-12-12 NOTE — Telephone Encounter (Signed)
Specialty pharmacy called to confirm patient medication. Per Cassie, patient is no longer on Deraprim. Pharmacist notified.   Jagger Beahm Lorita Officer, RN

## 2020-12-16 ENCOUNTER — Encounter: Payer: Self-pay | Admitting: Infectious Diseases

## 2020-12-16 ENCOUNTER — Other Ambulatory Visit: Payer: Self-pay

## 2020-12-16 ENCOUNTER — Ambulatory Visit (INDEPENDENT_AMBULATORY_CARE_PROVIDER_SITE_OTHER): Payer: Self-pay | Admitting: Infectious Diseases

## 2020-12-16 VITALS — BP 113/78 | HR 76 | Temp 98.1°F | Wt 169.0 lb

## 2020-12-16 DIAGNOSIS — B582 Toxoplasma meningoencephalitis: Secondary | ICD-10-CM

## 2020-12-16 DIAGNOSIS — R569 Unspecified convulsions: Secondary | ICD-10-CM

## 2020-12-16 DIAGNOSIS — H538 Other visual disturbances: Secondary | ICD-10-CM

## 2020-12-16 DIAGNOSIS — K611 Rectal abscess: Secondary | ICD-10-CM

## 2020-12-16 DIAGNOSIS — N179 Acute kidney failure, unspecified: Secondary | ICD-10-CM

## 2020-12-16 DIAGNOSIS — M542 Cervicalgia: Secondary | ICD-10-CM

## 2020-12-16 DIAGNOSIS — G44309 Post-traumatic headache, unspecified, not intractable: Secondary | ICD-10-CM

## 2020-12-16 DIAGNOSIS — B20 Human immunodeficiency virus [HIV] disease: Secondary | ICD-10-CM

## 2020-12-16 DIAGNOSIS — R519 Headache, unspecified: Secondary | ICD-10-CM | POA: Insufficient documentation

## 2020-12-16 MED ORDER — LEVETIRACETAM 750 MG PO TABS: 750.0000 mg | ORAL_TABLET | Freq: Two times a day (BID) | ORAL | 3 refills | Status: DC

## 2020-12-16 NOTE — Assessment & Plan Note (Signed)
No meningismus on exam.  Seems musculoskeletal and likely due to how she has had to adjust with sleeping lately.  Massage seems to relieve the pain for her.  Try to advise she can use Tylenol if needed but she is very leery to use over-the-counter medications.  We discussed using a heating pad to the site intermittently to help, light stretching and ensuring that her pillow is suitable for her.

## 2020-12-16 NOTE — Assessment & Plan Note (Signed)
Creatinine back down to normal range at 1.10.  I tried to explain to her that she can take some Tylenol for aches and pains however she is very nervous and does not want take any extra medicine that she does not need to.

## 2020-12-16 NOTE — Assessment & Plan Note (Signed)
Continues on Dovato once daily.  We reviewed her lab work that [employer] drew a few weeks ago and her viral load is undetectable.  I explained to her that her Dovato is going to be a lifelong medication commitment with no plans to stop because the HIV will reactivate in her system.  She seems to understand this but we will continue with reinforcing this fact for her. We will hold on routine vaccinations until we see how her immune system recovers.   Return in about 3 weeks (around 01/06/2021).

## 2020-12-16 NOTE — Progress Notes (Signed)
Name: Candace Jones  DOB: 07/24/1980 MRN: [id number] PCP: Candace Stain, MD    Brief Narrative:  Candace Jones is a 40 y.o. 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      Subjective:   Chief Complaint  Patient presents with   Follow-up    AIDS, Toxoplasma meningoencephalitis         HPI: Candace Jones is here alone for follow-up.  She says that her sister left from her house and she is doing better.   Main concern today is left-sided head pain over the old incision site from her brain biopsy.  She reports this is been very tender when it is touched.  So much so that she has been sleeping on her right side which is not normal for her.  Also with some left sided neck pain.  Makes it hard to turn her head to the right laterally.  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  No weakness unilaterally.  She is not been taking anything for her headaches because she is very worried about what she can and cannot take; she was told no Tylenol or ibuprofen/Aleve due to her kidney damage recently.  She does have some vision changes.  She did talk with [place] once about this in the hospital but that seem to have resolved.  She describes this to be progressive changes over a long period of time nothing acute.  She has blurry vision and needs to move her hands further out when she pulled something to read it better.  Does not notice any damned or darkened vision or holes in visual field.  No eye pain.  She feels that there is 1 missing medication that starts with a D that the pharmacy was supposed to  rechecks about.  She brings all of her medications with her today.  Wondering how long she does not have to take them all.   Review of Systems  Constitutional:  Negative for chills, diaphoresis and fever.  HENT:  Negative for facial swelling.   Eyes:  Negative for photophobia and visual disturbance.       Blurry vision +  Respiratory:  Negative for cough and shortness of breath.   Cardiovascular:  Negative for chest pain.  Gastrointestinal:  Negative for abdominal distention, abdominal pain, constipation, diarrhea and nausea.  Genitourinary:  Negative for flank pain and pelvic pain.  Musculoskeletal:  Positive for neck pain. Negative for back pain and myalgias.  Neurological:  Positive for headaches. Negative for dizziness, tremors, seizures, facial asymmetry, speech difficulty, weakness and light-headedness.  Hematological:  Negative for adenopathy.  Psychiatric/Behavioral:  Negative for confusion, dysphoric mood and sleep disturbance.      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 60 capsule 2  Dolutegravir-lamiVUDine (DOVATO) 50-300 MG TABS Take 1 tablet by mouth daily. 30 tablet 3   ferrous sulfate 325 (65 FE) MG tablet Take 1 tablet (325 mg total) by mouth daily with breakfast. 90 tablet 0   omeprazole (PRILOSEC) 20 MG capsule Take 1 capsule (20 mg total) by mouth daily. 30 capsule 0   sulfamethoxazole-trimethoprim (BACTRIM DS) 800-160 MG tablet Take 1 tablet by mouth 2 (two) times daily. 60 tablet 5   levETIRAcetam (KEPPRA) 100 MG/ML solution Take 7.5 mLs (750 mg total) by mouth 2 (two) times daily. 473 mL 0   promethazine (PHENERGAN) 12.5 MG tablet Take 1 tablet (12.5 mg total) by mouth every 6 (six) hours as  needed for nausea or vomiting. (Patient not taking: Reported on 12/16/2020) 60 tablet 1   No facility-administered medications prior to visit.     No Known Allergies  Social History   Tobacco Use   Smoking status: Never   Smokeless tobacco: Never  Substance Use Topics   Alcohol use: Never   Drug use: Never    Family History  Family history unknown: Yes    Social History   Substance and Sexual Activity  Sexual Activity Yes   Birth control/protection: None     Objective:   Vitals:   12/16/20 0939 12/16/20 0940  BP:  113/78  Pulse:  76  Temp:  98.1 F (36.7 C)  TempSrc:  Oral  Weight: 169 lb (76.7 kg) 169 lb (76.7 kg)   Body mass index is 28.12 kg/m.  Physical Exam Vitals reviewed.  Constitutional:      Comments: Sitting comfortably in the chair in no distress.  HENT:     Head: Normocephalic.     Comments: She has a well-healed surgical incision over the left temporal lobe.  There is tenderness along the incision line with palpation.  There is no swelling, erythema, induration or fluctuance noted.    Mouth/Throat:     Mouth: Mucous membranes are moist. No oral lesions.     Dentition: No dental abscesses.     Pharynx: Oropharynx is clear.  Eyes:     General: No scleral icterus.    Extraocular Movements: Extraocular movements intact.     Conjunctiva/sclera: Conjunctivae normal.     Pupils: Pupils are equal, round, and reactive to light.  Neck:     Comments: She does have some difficulty with right lateral rotation.  She is able to complete maneuver with passive range of motion.  Tenderness over the left sternocleidomastoid muscle.  She is able to raise both shoulders up against gravity and resistance. Cardiovascular:     Rate and Rhythm: Normal rate and regular rhythm.     Heart sounds: Normal heart sounds.  Pulmonary:     Effort: Pulmonary effort is normal.     Breath sounds: Normal breath sounds.  Abdominal:     General: There is no distension.      Palpations: Abdomen is soft.     Tenderness: There is no abdominal tenderness.  Musculoskeletal:        General: No tenderness. Normal range of motion.  Lymphadenopathy:     Cervical: No cervical adenopathy.  Skin:    General: Skin is warm and dry.     Findings: No rash.  Neurological:     General: No focal deficit present.     Mental Status: She is alert and oriented to person, place, and time.     Sensory: No sensory deficit.     Motor: No  weakness.     Coordination: Coordination normal.     Gait: Gait normal.  Psychiatric:        Judgment: Judgment normal.     Lab Results Lab Results  Component Value Date   WBC 3.3 (L) 11/14/2020   HGB 8.0 (L) 11/14/2020   HCT 25.2 (L) 11/14/2020   MCV 78.0 (L) 11/14/2020   PLT 203 11/14/2020    Lab Results  Component Value Date   CREATININE 1.10 12/04/2020   BUN 15 12/04/2020   NA 138 12/04/2020   K 4.1 12/04/2020   CL 105 12/04/2020   CO2 27 12/04/2020    Lab Results  Component Value Date   ALT 41 11/14/2020   AST 33 11/14/2020   ALKPHOS 54 11/14/2020   BILITOT 0.6 11/14/2020    No results found for: CHOL, HDL, LDLCALC, LDLDIRECT, TRIG, CHOLHDL HIV 1 RNA Quant  Date Value  12/04/2020 <20 Copies/mL (H)  11/06/2020 100 copies/mL  09/30/2020 28,100 copies/mL     Assessment & Plan:   Problem List Items Addressed This Visit       High   AIDS (acquired immune deficiency syndrome) ([place])    Continues on Dovato once daily.  We reviewed her lab work that [employer] drew a few weeks ago and her viral load is undetectable.  I explained to her that her Dovato is going to be a lifelong medication commitment with no plans to stop because the HIV will reactivate in her system.  She seems to understand this but we will continue with reinforcing this fact for her. We will hold on routine vaccinations until we see how her immune system recovers.   Return in about 3 weeks (around 01/06/2021).          Unprioritized   Seizure ([place])     Continue Keppra twice daily.  I sent in refills for her pills so that they are available for her to pick up.  She has follow-up with neurology on 22 July.  I explained to her that the [place] prophylaxis is likely to continue for 4 to 6 months from what I am reading but neurology will help to make this recommendation.        Relevant Medications   levETIRAcetam (KEPPRA) 750 MG tablet   Perirectal abscess    Resolved post 3 weeks of clindamycin percutaneous drain.  No concerning symptoms have returned at this time.  We will continue to monitor       Neck pain    No meningismus on exam.  Seems musculoskeletal and likely due to how she has had to adjust with sleeping lately.  Massage seems to relieve the pain for her.  Try to advise she can use Tylenol if needed but she is very leery to use over-the-counter medications.  We discussed using a heating pad to the site intermittently to help, light stretching and ensuring that her pillow is suitable for her.         Head pain    Does not seem like true headaches necessarily but more incisional pain given her need to stay off the incision and reproducible pain when it is touched.  There is no signs of infection or concern based on my exam today.  I will have her back to see Dr. Arnoldo Morale in follow-up to ensure there is nothing concerning.  Again advised her that if these worsen in severity to please let me know so we can refer her to the ER for repeat imaging.  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  Fortunately her kidney function has continue to improve and resolved back to normal range with creatinine 1.10.  Her viral load is undetectable.  We will repeat her CD4 count at next lab check in 3 weeks.  Her head pain seems more related to incision as it is reproducible.  I do not think we need to  urgently redo a MRI currently given they are not progressively getting worse or severe.  Asked her to please let me know if things change -given she is uninsured we will need to refer her to the ER for repeat head imaging.  Otherwise we will have her follow-up with me again in 3 weeks.       Relevant Orders   Ambulatory referral to Neurosurgery   Blurry vision    Normal extraocular eye movements.  She has had intermittent complaints of blurry vision. She can read things with arms fully extended out and does not seem to have any visual field cuts. I do not think this is due to unresolved toxo involving her orbit. Likely related to needing corrective lenses vs magnification.  Will place referral to ophthalmology for assistance of thorough retinal exam to ensure no toxoplasmosis or other opportunistic infection (CMV?) is playing a role here.       AKI (acute kidney injury) ([place])    Creatinine back down to normal range at 1.10.  I tried to explain to her that she can take some Tylenol for aches and pains however she is very nervous and does not want take any extra medicine that she does not need to.       Other Visit Diagnoses     Blurry vision, bilateral       Relevant Orders   Ambulatory referral to Ophthalmology      Candace Madeira, MSN, NP-C [facility] for [place] Pager: [phone] Office: [phone]  12/16/20  2:26 PM

## 2020-12-16 NOTE — Assessment & Plan Note (Signed)
Normal extraocular eye movements.  She has had intermittent complaints of blurry vision. She can read things with arms fully extended out and does not seem to have any visual field cuts. I do not think this is due to unresolved toxo involving her orbit. Likely related to needing corrective lenses vs magnification.  Will place referral to ophthalmology for assistance of thorough retinal exam to ensure no toxoplasmosis or other opportunistic infection (CMV?) is playing a role here.

## 2020-12-16 NOTE — Assessment & Plan Note (Signed)
She continues on maintenance therapy with 1 double strength Bactrim twice a day.  Fortunately her kidney function has continue to improve and resolved back to normal range with creatinine 1.10.  Her viral load is undetectable.  We will repeat her CD4 count at next lab check in 3 weeks.  Her head pain seems more related to incision as it is reproducible.  I do not think we need to urgently redo a MRI currently given they are not progressively getting worse or severe.  Asked her to please let me know if things change -given she is uninsured we will need to refer her to the ER for repeat head imaging.  Otherwise we will have her follow-up with me again in 3 weeks.

## 2020-12-16 NOTE — Assessment & Plan Note (Signed)
Continue Keppra twice daily.  I sent in refills for her pills so that they are available for her to pick up.  She has follow-up with neurology on 22 July.  I explained to her that the [place] prophylaxis is likely to continue for 4 to 6 months from what I am reading but neurology will help to make this recommendation.

## 2020-12-16 NOTE — Assessment & Plan Note (Signed)
Does not seem like true headaches necessarily but more incisional pain given her need to stay off the incision and reproducible pain when it is touched.  There is no signs of infection or concern based on my exam today.  I will have her back to see Dr. Arnoldo Morale in follow-up to ensure there is nothing concerning.  Again advised her that if these worsen in severity to please let me know so we can refer her to the ER for repeat imaging.

## 2020-12-16 NOTE — Patient Instructions (Addendum)
Nice to see you.   Will get you in to see a eye doctor and back to see the surgeon about your head pain.   Would like for you to see the brain surgeon again to make sure the pain is not something to worry about.   For the neck pain you can try heat pads or packs from the pharmacy. Don't put heat directly on your skin without a towel in between.   If your headaches get worse please call me so we can set up an MRI of your brain.   Please come back in 3 weeks.      Ravi de vous voir.  Vous amnera voir un ophtalmologiste et retournera voir le chirurgien au sujet de votre mal de tte.  J'aimerais que vous Armed forces training and education officer pour vous assurer que la douleur n'est pas quelque chose dont vous devez vous inquiter.  Pour les douleurs au cou, vous pouvez essayer des coussins chauffants ou des packs de la [place]. Ne mettez pas de Risk manager sur votre peau sans une serviette entre les deux.  Si vos maux de tte s'aggravent, veuillez m'appeler afin que nous puissions mettre en place [place] IRM de Multimedia programmer.  Merci de Education officer, environmental 3 semaines.

## 2020-12-16 NOTE — Assessment & Plan Note (Signed)
Resolved post 3 weeks of clindamycin percutaneous drain.  No concerning symptoms have returned at this time.  We will continue to monitor

## 2020-12-19 ENCOUNTER — Inpatient Hospital Stay (INDEPENDENT_AMBULATORY_CARE_PROVIDER_SITE_OTHER): Payer: Self-pay | Admitting: Primary Care

## 2020-12-22 LAB — ACID FAST CULTURE WITH REFLEXED SENSITIVITIES (MYCOBACTERIA): Acid Fast Culture: NEGATIVE

## 2020-12-24 ENCOUNTER — Telehealth: Payer: Self-pay

## 2020-12-24 NOTE — Telephone Encounter (Signed)
Received refill request for Daraprim from [facility]. Per Janene Madeira, NP this medication has been discontinued. RN spoke with Tiffany at [facility] to cancel medication.   Beryle Flock, RN

## 2020-12-25 LAB — ACID FAST CULTURE WITH REFLEXED SENSITIVITIES (MYCOBACTERIA): Acid Fast Culture: NEGATIVE

## 2021-01-01 ENCOUNTER — Other Ambulatory Visit: Payer: Self-pay | Admitting: Pharmacist

## 2021-01-01 DIAGNOSIS — K219 Gastro-esophageal reflux disease without esophagitis: Secondary | ICD-10-CM

## 2021-01-02 ENCOUNTER — Other Ambulatory Visit: Payer: Self-pay

## 2021-01-02 ENCOUNTER — Encounter: Payer: Self-pay | Admitting: Neurology

## 2021-01-02 ENCOUNTER — Ambulatory Visit (INDEPENDENT_AMBULATORY_CARE_PROVIDER_SITE_OTHER): Payer: BLUE CROSS/BLUE SHIELD | Admitting: Neurology

## 2021-01-02 VITALS — BP 119/76 | HR 70 | Ht 65.0 in | Wt 178.0 lb

## 2021-01-02 DIAGNOSIS — M79671 Pain in right foot: Secondary | ICD-10-CM | POA: Diagnosis not present

## 2021-01-02 MED ORDER — LEVETIRACETAM 750 MG PO TABS: 750.0000 mg | ORAL_TABLET | Freq: Two times a day (BID) | ORAL | 1 refills | Status: DC

## 2021-01-02 NOTE — Progress Notes (Signed)
Reason for visit: Seizures  Referring physician: Gans is a 40 y.o. female  History of present illness:  Candace Jones is a 40 year old right-handed black African female with a history of HIV infection, AIDS, and a recent toxoplasmosis infection of the brain.  The patient presented to the hospital on 29 September 2020 with right-sided weakness and had a witnessed generalized seizure event in the emergency room.  The patient was noted to have severe edema of the brain affecting the frontal and temporal areas in 2 locations with ring enhancement.  The patient was determined to have an infection from toxoplasmosis.  She has been started on appropriate medical treatment, and the most recent MRI shows good improvement in the edema of the brain.  The patient was placed on Keppra while in the hospital, she remains on 750 mg twice daily.  She seems to tolerate this fairly well.  The patient still reports occasional headaches and some visual blurring.  The headaches are associated with sharp lancinating pains that are quite brief but relatively frequent.  She has light sensitivity.  She has chronic right foot pain following a fall on 16 September 2020.  The x-ray at that time showed a chronic fracture of the navicular bone with sclerosis.  The patient has not had any gait instability or difficulty controlling the bowels or the bladder.  She still has some underlying fatigue.  She is no longer working since the hospitalization.  She does not operate a motor vehicle.  She reports some neck stiffness which is improved with heat.  She is sent to this office for further evaluation.  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    Past Surgical History:  Procedure Laterality Date   APPLICATION OF CRANIAL NAVIGATION Left 10/02/2020   Procedure: APPLICATION OF CRANIAL NAVIGATION;  Surgeon: Newman Pies, MD;  Location: [facility];  Service: Neurosurgery;  Laterality: Left;   CRANIOTOMY Left 10/02/2020   Procedure: LEFT CRANIOTOMY FOR TUMOR BIOPSY/ RESECTION with BrainLab;  Surgeon: Newman Pies, MD;  Location: [facility];  Service: Neurosurgery;  Laterality: Left;   IR RADIOLOGIST EVAL & MGMT  10/17/2020   IR RADIOLOGIST EVAL & MGMT  12/02/2020    Family History  Family history unknown: Yes    Social history:  reports that she has never smoked. She has never used smokeless tobacco. She reports that she does not drink alcohol and does not use drugs.  Medications:  Prior to Admission medications   Medication Sig Start Date End Date Taking? Authorizing Provider  docusate sodium (COLACE) 100 MG capsule Take 1 capsule (100 mg total) by mouth 2 (two) times daily. 11/21/20  Yes [place] Callas, NP  Dolutegravir-lamiVUDine (DOVATO) 50-300 MG TABS Take 1 tablet by mouth daily. 11/15/20  Yes [place] Shi, MD  ferrous sulfate 325 (65 FE) MG tablet Take 1 tablet (325 mg total) by mouth daily with breakfast. 10/10/20 01/08/21 Yes [place] Eric J, DO  levETIRAcetam (KEPPRA) 750 MG tablet Take 1 tablet (750 mg total) by mouth 2 (two) times daily. 12/16/20  Yes [place] Callas, NP  omeprazole (PRILOSEC) 20 MG capsule TAKE 1 CAPSULE(20 MG) BY MOUTH DAILY 01/01/21  Yes Kuppelweiser, Cassie L, RPH-CPP  promethazine (PHENERGAN) 12.5 MG tablet Take 1 tablet (12.5 mg total) by mouth every 6 (six) hours as needed for nausea or vomiting. 10/31/20  Yes Kuppelweiser, Cassie L, RPH-CPP  sulfamethoxazole-trimethoprim (BACTRIM DS) 800-160 MG tablet Take 1 tablet by mouth 2 (two) times daily. 11/21/20  Yes Jarrell Callas, NP     No Known Allergies  ROS:  Out of a complete 14 system review of symptoms, the patient complains only of the following symptoms, and all other reviewed systems are negative.  Right foot pain Headaches Visual blurring Fatigue  Blood pressure 119/76, pulse 70, height '5\' 5"'$  (1.651 m),  weight 178 lb (80.7 kg).  Physical Exam  General: The patient is alert and cooperative at the time of the examination.  Eyes: Pupils are equal, round, and reactive to light. Discs are flat bilaterally.  Neck: The neck is supple, no carotid bruits are noted.  Respiratory: The respiratory examination is clear.  Cardiovascular: The cardiovascular examination reveals a regular rate and rhythm, no obvious murmurs or rubs are noted.  Skin: Extremities are without significant edema.  Neurologic Exam  Mental status: The patient is alert and oriented x 3 at the time of the examination. The patient has apparent normal recent and remote memory, with an apparently normal attention span and concentration ability.  Cranial nerves: Facial symmetry is present. There is good sensation of the face to pinprick and soft touch on the left, decreased on the right. The strength of the facial muscles and the muscles to head turning and shoulder shrug are normal bilaterally. Speech is well enunciated, no aphasia or dysarthria is noted. Extraocular movements are full. Visual fields are full. The tongue is midline, and the patient has symmetric elevation of the soft palate. No obvious hearing deficits are noted.  Motor: The motor testing reveals 5 over 5 strength of all 4 extremities. Good symmetric motor tone is noted throughout.  Sensory: Sensory testing is intact to pinprick, soft touch, vibration sensation, and position sense on all 4 extremities, with exception some decreased pinprick sensation on the right leg as compared to the left. No evidence of extinction is noted.  Coordination: Cerebellar testing reveals good finger-nose-finger and heel-to-shin bilaterally.  Gait and station: Gait is normal. Tandem gait is normal. Romberg is negative. No drift is seen.  Reflexes: Deep tendon reflexes are symmetric and normal bilaterally. Toes are downgoing bilaterally.   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   * MRI scan images were reviewed online. I agree with the written report.   EEG 10/01/20:  IMPRESSION: This study is suggestive of cortical dysfunction in left temporal region likely secondary underlying structural abnormality, ictal, postictal state.  Additionally there is evidence of moderate diffuse encephalopathy, nonspecific etiology.  No seizures or epileptiform discharges were seen throughout the recording.    Assessment/Plan:  1.  HIV infection, AIDS  2.  Cerebral toxoplasmosis, left brain  3.  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  She is to remain on Keppra, but she may be able to come off the medication within the next 12 months.  She will follow-up here in 6 months.  In the future, she can be followed by Dr. April Manson.  Jill Alexanders MD 01/02/2021 10:32 AM  [facility] [address] [place], [place] [address]  Phone [phone] Fax  [phone]  

## 2021-01-05 NOTE — Assessment & Plan Note (Addendum)
She is doing well with treatment on once daily Dovato tablet. Will re-asses viral load and repeat CD4 today for therapeutic response  (nadir < 35).  Continue secondary prophylaxis for toxo/pjp with 1DS bactrim BID.  No other findings of opportunistic process today. I suspect her rash is related to advanced HIV and will improve on treatment.   Return in about 3 months (around 04/08/2021).

## 2021-01-05 NOTE — Assessment & Plan Note (Signed)
Maintenance therapy ongoing with 1 DS Bactrim BID will be continued until she has adequate immune reconstitution.  FU with neurology appreciated - continue keppra x 12 months with return visit in December 2022.

## 2021-01-05 NOTE — Progress Notes (Signed)
Name: Candace Jones  DOB: 03-22-1981 MRN: [id number] PCP: Elsie Stain, MD    Brief Narrative:  Candace Jones is a 40 y.o. 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      Subjective:   Chief Complaint  Patient presents with   Follow-up    CNS toxoplasmosis. C/O throat pain x 2 days.        HPI: Candace Jones is here alone for follow-up HIV/AIDS, Toxoplasmosis encephalitis (now completed induciton therapy and on 2x daily DS bactrim for maintenance), ARF.   She has been doing well since our last OV together. Wonders if she can stop the iron, docusate and omeprazole  Concerned about the rash on her arms - it is unchanged but she wants it to go away. No new spots. Skin is smoother now and not draining anything.  Sore throat x 1-2 days but better today. No other symptoms of URIs.  Continues her dovato once daily, keppra twice daily, colace twice daily - was constipated this morning she says- and iron once daily.    Chart review and follow up:  FU with Neurology recently with plans to continue antiepileptics for 41month --> follow up with them in 621mDecember 2022). She is here with in person FrPakistannterpretor. She is worried that the cost of her Keppra is $200 where she never had to pay this in the past.  FU with ophthalmology for blurry visition --> she says her neurologist looked in her eyes recently with ophthalmoscope and no concerns. This has not acutely changed since last OV and more chronic problem.   FU with neurosurgery with incision pain/ discomfort --> has not heard from the office yet to schedule but continues to be tender/sensitive  causing some discomfort with sleep.   [facility].    Review of Systems  Constitutional:  Negative for chills, diaphoresis and fever.  HENT:  Negative for facial swelling.   Eyes:  Negative for photophobia and visual disturbance.       Blurry vision +  Respiratory:  Negative for cough and shortness of breath.   Cardiovascular:  Negative for chest pain.  Gastrointestinal:  Negative for abdominal distention, abdominal pain, constipation, diarrhea and nausea.  Genitourinary:  Negative for flank pain and pelvic pain.  Musculoskeletal:  Positive for neck pain. Negative for back pain and myalgias.  Neurological:  Positive for headaches. Negative for dizziness, tremors, seizures, facial asymmetry, speech difficulty, weakness and light-headedness.  Hematological:  Negative for adenopathy.  Psychiatric/Behavioral:  Negative for confusion, dysphoric mood and sleep disturbance.      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 60 capsule 2   Dolutegravir-lamiVUDine (DOVATO) 50-300 MG TABS Take 1 tablet by mouth daily. 30 tablet 3   ferrous sulfate 325 (65 FE) MG tablet Take 1  tablet (325 mg total) by mouth daily with breakfast. 90 tablet 0   promethazine (PHENERGAN) 12.5 MG tablet Take 1 tablet (12.5 mg total) by mouth every 6 (six) hours as needed for nausea or vomiting. 60 tablet 1   sulfamethoxazole-trimethoprim (BACTRIM DS) 800-160 MG tablet Take 1 tablet by mouth 2 (two) times daily. 60 tablet 5   levETIRAcetam (KEPPRA) 750 MG tablet Take 1 tablet (750 mg total) by mouth 2 (two) times daily. 180 tablet 1   omeprazole (PRILOSEC) 20 MG capsule TAKE 1 CAPSULE(20 MG) BY  MOUTH DAILY 30 capsule 0   No facility-administered medications prior to visit.     No Known Allergies  Social History   Tobacco Use   Smoking status: Never   Smokeless tobacco: Never  Substance Use Topics   Alcohol use: Never   Drug use: Never    Family History  Family history unknown: Yes    Social History   Substance and Sexual Activity  Sexual Activity Yes   Birth control/protection: None     Objective:   Vitals:   01/06/21 0916  BP: 118/75  Pulse: 80  Temp: 97.9 F (36.6 C)  TempSrc: Oral  Weight: 178 lb 9.6 oz (81 kg)   Body mass index is 29.72 kg/m.  Physical Exam Vitals reviewed.  Constitutional:      Comments: Sitting comfortably in the chair in no distress.  HENT:     Head: Normocephalic.     Comments: She has a well-healed surgical incision over the left temporal lobe.  There is tenderness along the incision line with palpation.  There is no swelling, erythema, induration or fluctuance noted.    Mouth/Throat:     Mouth: Mucous membranes are moist. No oral lesions.     Dentition: No dental abscesses.     Pharynx: Oropharynx is clear.  Eyes:     General: No scleral icterus.    Extraocular Movements: Extraocular movements intact.     Conjunctiva/sclera: Conjunctivae normal.     Pupils: Pupils are equal, round, and reactive to light.  Cardiovascular:     Rate and Rhythm: Normal rate and regular rhythm.     Heart sounds: Normal heart sounds.  Pulmonary:     Effort: Pulmonary effort is normal.     Breath sounds: Normal breath sounds.  Abdominal:     General: There is no distension.     Palpations: Abdomen is soft.     Tenderness: There is no abdominal tenderness.  Musculoskeletal:        General: No tenderness. Normal range of motion.  Lymphadenopathy:     Cervical: No cervical adenopathy.  Skin:    General: Skin is warm and dry.     Findings: No rash.  Neurological:     General: No focal deficit present.     Mental Status: She is  alert and oriented to person, place, and time.     Sensory: No sensory deficit.     Motor: No weakness.     Coordination: Coordination normal.     Gait: Gait normal.  Psychiatric:        Judgment: Judgment normal.     Lab Results Lab Results  Component Value Date   WBC 3.3 (L) 11/14/2020   HGB 8.0 (L) 11/14/2020   HCT 25.2 (L) 11/14/2020   MCV 78.0 (L) 11/14/2020   PLT 203 11/14/2020    Lab Results  Component Value Date   CREATININE 1.10 12/04/2020   BUN 15 12/04/2020  NA 138 12/04/2020   K 4.1 12/04/2020   CL 105 12/04/2020   CO2 27 12/04/2020    Lab Results  Component Value Date   ALT 41 11/14/2020   AST 33 11/14/2020   ALKPHOS 54 11/14/2020   BILITOT 0.6 11/14/2020    No results found for: CHOL, HDL, LDLCALC, LDLDIRECT, TRIG, CHOLHDL HIV 1 RNA Quant  Date Value  12/04/2020 <20 Copies/mL (H)  11/06/2020 100 copies/mL  09/30/2020 28,100 copies/mL     Assessment & Plan:   Problem List Items Addressed This Visit       High   AIDS (acquired immune deficiency syndrome) ([place]) - Primary    She is doing well with treatment on once daily Dovato tablet. Will re-asses viral load and repeat CD4 today for therapeutic response  (nadir < 35).  Continue secondary prophylaxis for toxo/pjp with 1DS bactrim BID.  No other findings of opportunistic process today. I suspect her rash is related to advanced HIV and will improve on treatment.   Return in about 3 months (around 04/08/2021).        Relevant Orders   HIV-1 RNA quant-no reflex-bld   T-helper cell (CD4)- ([facility] clinic only)     Unprioritized   Therapeutic drug monitoring    Lab Results  Component Value Date   CREATININE 1.10 12/04/2020   CREATININE 1.21 (H) 11/20/2020   CREATININE 1.59 (H) 11/15/2020  Her AKI has resolved - will repeat BMP today to reassess Cr and K+ while on bactrim. Continue tenofovir sparing ARVs.       Seizure ([place])    [facility]ed [facility] today - she needs rx for 30-day supply for  HMAP to cover. This was corrected today so she can pick up for free and continue as outlined from Neurology (12 months).        Relevant Medications   levETIRAcetam (KEPPRA) 750 MG tablet   Head pain    Follow up with NSGY team on August 2nd @ 1:30 pm. She was given office / appointment information today and is aware of the appt.  Her incision continues to look very well healed and good but with c/o pain reassurance from [place] appreciated. ?neurotin to help if nerve related.        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  FU with neurology appreciated - continue keppra x 12 months with return visit in December 2022.        Relevant Medications   levETIRAcetam (KEPPRA) 750 MG tablet   Blurry vision    I suspect she needs corrective lenses. Her best option for resource is to get [employer] completed. We discussed today this process and to work with [facility] at her visit tomorrow.  I wish HMAP had better resources for her.        Janene Madeira, MSN, NP-C [facility] [place] Pager: [phone] Office: [phone]  01/06/21  10:17 AM

## 2021-01-06 ENCOUNTER — Ambulatory Visit (INDEPENDENT_AMBULATORY_CARE_PROVIDER_SITE_OTHER): Payer: Self-pay | Admitting: Infectious Diseases

## 2021-01-06 ENCOUNTER — Other Ambulatory Visit: Payer: Self-pay

## 2021-01-06 ENCOUNTER — Encounter: Payer: Self-pay | Admitting: Infectious Diseases

## 2021-01-06 VITALS — BP 118/75 | HR 80 | Temp 97.9°F | Wt 178.6 lb

## 2021-01-06 DIAGNOSIS — H538 Other visual disturbances: Secondary | ICD-10-CM

## 2021-01-06 DIAGNOSIS — Z5181 Encounter for therapeutic drug level monitoring: Secondary | ICD-10-CM

## 2021-01-06 DIAGNOSIS — B20 Human immunodeficiency virus [HIV] disease: Secondary | ICD-10-CM

## 2021-01-06 DIAGNOSIS — N179 Acute kidney failure, unspecified: Secondary | ICD-10-CM

## 2021-01-06 DIAGNOSIS — R569 Unspecified convulsions: Secondary | ICD-10-CM

## 2021-01-06 DIAGNOSIS — B582 Toxoplasma meningoencephalitis: Secondary | ICD-10-CM

## 2021-01-06 DIAGNOSIS — G44309 Post-traumatic headache, unspecified, not intractable: Secondary | ICD-10-CM

## 2021-01-06 MED ORDER — LEVETIRACETAM 750 MG PO TABS: 750.0000 mg | ORAL_TABLET | Freq: Two times a day (BID) | ORAL | 5 refills | Status: DC

## 2021-01-06 NOTE — Patient Instructions (Addendum)
Please talk with the [place] at your appointment tomorrow about the [facility]. This is an opportunity for health insurance.   This can help with vision exam - I think you need glasses.   Please pick up your Keppra from the pharmacy (larger pink pills)   Stop the Omeprazole medication.   Will see you back  in October. Please continue your other medications everyday.      [place] de sant et de bien-tre communautaire lors de votre rendez-vous demain au sujet de la carte orange. C'est une opportunit pour [employer].  Cela peut aider  la vision - je pense que vous avez besoin de lunettes.  Althea Grimmer rcuprer votre Keppra  la pharmacie (pilules roses plus grosses)  Dentist le mdicament Omeprazole.  On se reverra en octobre. Veuillez continuer vos autres mdicaments tous les jours.  Dr. Arnoldo Morale - the neurosurgeon  Address: [address] [place], [place], [place] [address] Phone: [phone]

## 2021-01-06 NOTE — Assessment & Plan Note (Signed)
I suspect she needs corrective lenses. Her best option for resource is to get [employer] completed. We discussed today this process and to work with [facility] at her visit tomorrow.  I wish HMAP had better resources for her.

## 2021-01-06 NOTE — Assessment & Plan Note (Signed)
Lab Results  Component Value Date   CREATININE 1.10 12/04/2020   CREATININE 1.21 (H) 11/20/2020   CREATININE 1.59 (H) 11/15/2020   Her AKI has resolved - will repeat BMP today to reassess Cr and K+ while on bactrim. Continue tenofovir sparing ARVs.

## 2021-01-06 NOTE — Assessment & Plan Note (Signed)
Follow up with NSGY team on August 2nd @ 1:30 pm. She was given office / appointment information today and is aware of the appt.  Her incision continues to look very well healed and good but with c/o pain reassurance from [place] appreciated. ?neurotin to help if nerve related.

## 2021-01-06 NOTE — Assessment & Plan Note (Signed)
[facility]ed [facility] today - she needs rx for 30-day supply for HMAP to cover. This was corrected today so she can pick up for free and continue as outlined from Neurology (12 months).

## 2021-01-07 ENCOUNTER — Other Ambulatory Visit: Payer: Self-pay

## 2021-01-07 ENCOUNTER — Encounter: Payer: Self-pay | Admitting: Critical Care Medicine

## 2021-01-07 ENCOUNTER — Ambulatory Visit: Payer: BLUE CROSS/BLUE SHIELD | Attending: Critical Care Medicine | Admitting: Critical Care Medicine

## 2021-01-07 VITALS — BP 106/73 | HR 76 | Resp 16 | Wt 178.0 lb

## 2021-01-07 DIAGNOSIS — H538 Other visual disturbances: Secondary | ICD-10-CM

## 2021-01-07 DIAGNOSIS — K611 Rectal abscess: Secondary | ICD-10-CM

## 2021-01-07 DIAGNOSIS — D500 Iron deficiency anemia secondary to blood loss (chronic): Secondary | ICD-10-CM

## 2021-01-07 DIAGNOSIS — B582 Toxoplasma meningoencephalitis: Secondary | ICD-10-CM

## 2021-01-07 DIAGNOSIS — Z139 Encounter for screening, unspecified: Secondary | ICD-10-CM | POA: Diagnosis not present

## 2021-01-07 DIAGNOSIS — E875 Hyperkalemia: Secondary | ICD-10-CM

## 2021-01-07 DIAGNOSIS — R569 Unspecified convulsions: Secondary | ICD-10-CM

## 2021-01-07 DIAGNOSIS — G44309 Post-traumatic headache, unspecified, not intractable: Secondary | ICD-10-CM

## 2021-01-07 DIAGNOSIS — M25571 Pain in right ankle and joints of right foot: Secondary | ICD-10-CM

## 2021-01-07 LAB — T-HELPER CELL (CD4) - (RCID CLINIC ONLY)
CD4 % Helper T Cell: 7 % — ABNORMAL LOW (ref 33–65)
CD4 T Cell Abs: 75 /uL — ABNORMAL LOW (ref 400–1790)

## 2021-01-07 NOTE — Assessment & Plan Note (Signed)
Encouraged patient pick up financial aid packet so she can apply for the orange card. She was given the name of the [facility] optometrist who can provide eye exams for un-insured. No history of DM or HTN

## 2021-01-07 NOTE — Assessment & Plan Note (Signed)
Monitor

## 2021-01-07 NOTE — Assessment & Plan Note (Signed)
Pain along dorsum of foot along talus. No bony tenderness or crepitus appreciated. Told patient to keep wearing her ankle brace and to take tylenol. She was also told to get sandals that have better arch support.

## 2021-01-07 NOTE — Assessment & Plan Note (Signed)
keppra managed by neuro. She has been seizure free since hospitalization in April for toxoplasmosis

## 2021-01-07 NOTE — Assessment & Plan Note (Signed)
resolved 

## 2021-01-07 NOTE — Assessment & Plan Note (Signed)
Managed by infectious disease. Currently on bactrim 800-'160mg'$  and stable exam.

## 2021-01-07 NOTE — Progress Notes (Signed)
Wants to discuss medication- Ferrous Sulfate Has a form that needs to be completed.

## 2021-01-07 NOTE — Progress Notes (Signed)
New Patient Office Visit  Subjective:  Patient ID: Candace Jones, female    DOB: February 21, 1981  Age: 40 y.o. MRN: [id number]  CC:  Chief Complaint  Patient presents with   Establish Care   Ankle Pain   Eye Problem    HPI  [place] language interpretation in person provided by language [place] presents for a new patient evaluation to establish primary care with Dr. Joya Gaskins with a chief complaint of blurry vision, right ankle pain and to get the "orange card" financial assistance. She is accompanied by a [place]. She has a history of HIV infection, AIDS, and a recent toxoplasmosis infection of the brain dated 09/29/20 which she is taking oral bactrim and keppra '750mg'$ . She subsequently returned to the ED in May for a perirectal abscess and 2 separate drains were placed by IR. The drains have since been removed and the patient states she is doing well. She says her memory and concentration have been diminished but denies fevers, chills, seizures, headaches or limb weakness. She used to wear glasses but no longer has. She is requesting a visit to an eye doctor as she has to squint to read smaller texts but denies diplopia, discharge or eye injury.   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 She fell in April at work and sprained her right ankle. She was evaluated by employee health and given a brace. She does not take anything currently for her pain. Denies swelling or instability. While in the hospital in May for the abscess she was evaluated by nephrology regarding an AKI. She had been taking NSAIDs. BMP on 07/26 shows creatinine 0.81  Her HIV medication is managed by infectious disease and Keppra managed by neurologist. She recently saw them and has not had any changes to her medicine.    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[place]) 10/02/2020    Past Surgical History:  Procedure Laterality Date   APPLICATION OF CRANIAL NAVIGATION Left 10/02/2020   Procedure: APPLICATION OF CRANIAL NAVIGATION;  Surgeon: Newman Pies, MD;  Location: [place];  Service: Neurosurgery;  Laterality: Left;   buttock surgery     CRANIOTOMY Left 10/02/2020   Procedure: LEFT CRANIOTOMY FOR TUMOR BIOPSY/ RESECTION with BrainLab;  Surgeon: Newman Pies, MD;  Location: [place];  Service: Neurosurgery;  Laterality: Left;   IR RADIOLOGIST EVAL & MGMT  10/17/2020   IR RADIOLOGIST EVAL & MGMT  12/02/2020    Family History  Family history unknown: Yes    Social History   Socioeconomic History   Marital status: Single    Spouse name: Not on file   Number of children: Not on file   Years of education: Not on file   Highest education level: Not on file  Occupational History   Not on file  Tobacco Use   Smoking status: Never   Smokeless tobacco: Never  Substance and Sexual Activity   Alcohol use: Never   Drug use: Never   Sexual activity: Yes    Birth control/protection: None  Other Topics Concern   Not on file  Social History Narrative   Lives at home with daughter.  Working:  not working since April 2022 since surgery.  Education:  HS in [place].  [place].   Social Determinants of Health  Financial Resource Strain: Not on file  Food Insecurity: Not on file  Transportation Needs: Not on file  Physical Activity: Not on file  Stress: Not on file  Social Connections: Not on file  Intimate Partner Violence: Not on file    ROS Review of Systems  Constitutional:  Negative for chills, diaphoresis, fatigue and fever.  HENT: Negative.    Eyes:  Positive for visual disturbance. Negative for photophobia, pain, redness and itching.  Respiratory:  Negative for cough and shortness of breath.   Cardiovascular:  Negative for chest pain, palpitations and leg swelling.  Gastrointestinal:   Negative for abdominal distention, abdominal pain, constipation, diarrhea, nausea and vomiting.  Genitourinary: Negative.   Musculoskeletal:  Positive for arthralgias.  Neurological:  Negative for dizziness, tremors, seizures, syncope, weakness, light-headedness, numbness and headaches.  Psychiatric/Behavioral: Negative.     Objective:   Today's Vitals: BP 106/73 (BP Location: Left Arm, Patient Position: Sitting, Cuff Size: Normal)   Pulse 76   Resp 16   Wt 178 lb (80.7 kg)   LMP 12/20/2020 (Exact Date)   SpO2 100%   BMI 29.62 kg/m   Physical Exam Constitutional:      General: She is not in acute distress.    Appearance: Normal appearance. She is normal weight.  HENT:     Head: Normocephalic.     Mouth/Throat:     Mouth: Mucous membranes are moist.     Pharynx: Oropharynx is clear.  Eyes:     Pupils: Pupils are equal, round, and reactive to light.  Cardiovascular:     Rate and Rhythm: Normal rate and regular rhythm.     Pulses: Normal pulses.     Heart sounds: Normal heart sounds.  Pulmonary:     Effort: Pulmonary effort is normal.     Breath sounds: Normal breath sounds.  Abdominal:     General: Bowel sounds are normal.     Palpations: Abdomen is soft.  Musculoskeletal:     Cervical back: Neck supple.     Right foot: Tenderness (talus) present. No deformity or crepitus. Normal pulse.     Left foot: Normal.  Feet:     Right foot:     Skin integrity: Skin integrity normal.  Skin:    General: Skin is warm and dry.  Neurological:     General: No focal deficit present.     Mental Status: She is alert.    Assessment & Plan:   Problem List Items Addressed This Visit       Digestive   RESOLVED: Perirectal abscess    Resolved. Drains have been removed.          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 Currently on bactrim 800-'160mg'$  and stable exam.        Relevant Medications    sulfamethoxazole-trimethoprim (BACTRIM DS) 800-160 MG tablet     Other   Seizure ([place])    keppra managed by neuro. She has been seizure free since hospitalization in April for toxoplasmosis        Iron deficiency anemia    Hx of iron def  On iron supp  Will  Recheck Iron labs before refilling iron supp       Relevant Orders   Iron, TIBC and Ferritin Panel   CBC with Differential/Platelet   Right ankle pain    Pain along dorsum of foot along talus. No bony tenderness or crepitus appreciated. Told patient  to keep wearing her ankle brace and to take tylenol. She was also told to get sandals that have better arch support.        Blurry vision    Encouraged patient pick up financial aid packet so she can apply for the orange card. She was given the name of the [facility] optometrist who can provide eye exams for un-insured. No history of DM or HTN       Head pain    Monitor        RESOLVED: Hyperkalemia    resolved       Other Visit Diagnoses     Encounter for health-related screening       Relevant Orders   Comprehensive metabolic panel   Lipid panel   Hemoglobin A1c       Outpatient Encounter Medications as of 01/07/2021  Medication Sig   docusate sodium (COLACE) 100 MG capsule Take 1 capsule (100 mg total) by mouth 2 (two) times daily.   Dolutegravir-lamiVUDine (DOVATO) 50-300 MG TABS Take 1 tablet by mouth daily.   ferrous sulfate 325 (65 FE) MG tablet Take 1 tablet (325 mg total) by mouth daily with breakfast.   levETIRAcetam (KEPPRA) 750 MG tablet Take 1 tablet (750 mg total) by mouth 2 (two) times daily.   omeprazole (PRILOSEC) 20 MG capsule Take 20 mg by mouth daily.   sulfamethoxazole-trimethoprim (BACTRIM DS) 800-160 MG tablet Take 1 tablet by mouth 2 (two) times daily.   [DISCONTINUED] promethazine (PHENERGAN) 12.5 MG tablet Take 1 tablet (12.5 mg total) by mouth every 6 (six) hours as needed for nausea or vomiting. (Patient not taking: Reported on 01/07/2021)    [DISCONTINUED] sulfamethoxazole-trimethoprim (BACTRIM DS) 800-160 MG tablet Take 1 tablet by mouth 2 (two) times daily. (Patient not taking: Reported on 01/07/2021)   No facility-administered encounter medications on file as of 01/07/2021.  Agrees to PCV 20 vaccine Patient brought paperwork for exemption to [place] citizen exam. Patient was informed that we cannot fill this out for her and she should do her best to study for the citizen exam.   Follow-up: Return in about 4 months (around 05/10/2021).   Asencion Noble, MD

## 2021-01-07 NOTE — Assessment & Plan Note (Signed)
Resolved. Drains have been removed.

## 2021-01-07 NOTE — Patient Instructions (Signed)
Pick up financial application at the checkout desk obtain all necessary documents and make an appoint with financial counselor for her to get the orange card  No change in medications  Do not refill iron tablet as of yet rechecking your iron levels today  Pneumonia vaccine was given  Complete screening labs will be obtained today  Stay on your antibiotics and stay on your seizure medications HIV medicines as prescribed  Return to see Dr. Joya Gaskins in 4 months  We recommended you go to the [facility] we gave you a resource sheet for that the cost of the exam is $75 and the eyeglasses are anywhere from $20-$30 depending upon what type of lens you will need  Rcuprez la demande financire  la caisse, obtenez tous les documents ncessaires et prenez rendez-vous avec un Control and instrumentation engineer pour [employer] obtienne la carte orange  Pas de changement de mdicaments  Ne Surveyor, mining pas la tablette de fer pour [employer] moment en revrifiant vos niveaux de fer aujourd'hui  Le vaccin contre la pneumonie a t Magazine features editor  Des laboratoires de dpistage complets seront obtenus aujourd'hui  Continuez  prendre vos antibiotiques et Nurse, children's  prendre vos mdicaments antipileptiques Mdicaments anti-VIH tels que prescrits  Revenir voir le Dr Joya Gaskins dans [place] d'aller au [place] donn une feuille de ressources pour laquelle le cot de l'examen est de 75 $ et les lunettes cotent entre 20 $ et 30 $ [employer] type de lentille dont vous aurez besoin.

## 2021-01-07 NOTE — Assessment & Plan Note (Signed)
Hx of iron def  On iron supp  Will  Recheck Iron labs before refilling iron supp

## 2021-01-08 ENCOUNTER — Other Ambulatory Visit: Payer: Self-pay | Admitting: Critical Care Medicine

## 2021-01-08 LAB — COMPREHENSIVE METABOLIC PANEL
ALT: 18 IU/L (ref 0–32)
AST: 22 IU/L (ref 0–40)
Albumin/Globulin Ratio: 1 — ABNORMAL LOW (ref 1.2–2.2)
Albumin: 4 g/dL (ref 3.8–4.8)
Alkaline Phosphatase: 79 IU/L (ref 44–121)
BUN/Creatinine Ratio: 23 (ref 9–23)
BUN: 16 mg/dL (ref 6–24)
Bilirubin Total: 0.3 mg/dL (ref 0.0–1.2)
CO2: 21 mmol/L (ref 20–29)
Calcium: 8.7 mg/dL (ref 8.7–10.2)
Chloride: 104 mmol/L (ref 96–106)
Creatinine, Ser: 0.69 mg/dL (ref 0.57–1.00)
Globulin, Total: 3.9 g/dL (ref 1.5–4.5)
Glucose: 81 mg/dL (ref 65–99)
Potassium: 4.2 mmol/L (ref 3.5–5.2)
Sodium: 138 mmol/L (ref 134–144)
Total Protein: 7.9 g/dL (ref 6.0–8.5)
eGFR: 112 mL/min/{1.73_m2} (ref >59)

## 2021-01-08 LAB — CBC WITH DIFFERENTIAL/PLATELET
Basophils Absolute: 0 10*3/uL (ref 0.0–0.2)
Basos: 0 %
EOS (ABSOLUTE): 0.1 10*3/uL (ref 0.0–0.4)
Eos: 3 %
Hematocrit: 33.1 % — ABNORMAL LOW (ref 34.0–46.6)
Hemoglobin: 10.5 g/dL — ABNORMAL LOW (ref 11.1–15.9)
Immature Grans (Abs): 0 10*3/uL (ref 0.0–0.1)
Immature Granulocytes: 1 %
Lymphocytes Absolute: 1.3 10*3/uL (ref 0.7–3.1)
Lymphs: 40 %
MCH: 28.4 pg (ref 26.6–33.0)
MCHC: 31.7 g/dL (ref 31.5–35.7)
MCV: 90 fL (ref 79–97)
Monocytes Absolute: 0.4 10*3/uL (ref 0.1–0.9)
Monocytes: 13 %
Neutrophils Absolute: 1.4 10*3/uL (ref 1.4–7.0)
Neutrophils: 43 %
Platelets: 173 10*3/uL (ref 150–450)
RBC: 3.7 x10E6/uL — ABNORMAL LOW (ref 3.77–5.28)
RDW: 12.4 % (ref 11.7–15.4)
WBC: 3.2 10*3/uL — ABNORMAL LOW (ref 3.4–10.8)

## 2021-01-08 LAB — IRON,TIBC AND FERRITIN PANEL
Ferritin: 34 ng/mL (ref 15–150)
Iron Saturation: 14 % — ABNORMAL LOW (ref 15–55)
Iron: 39 ug/dL (ref 27–159)
Total Iron Binding Capacity: 288 ug/dL (ref 250–450)
UIBC: 249 ug/dL (ref 131–425)

## 2021-01-08 LAB — LIPID PANEL
Chol/HDL Ratio: 2.2 ratio (ref 0.0–4.4)
Cholesterol, Total: 123 mg/dL (ref 100–199)
HDL: 55 mg/dL (ref >39)
LDL Chol Calc (NIH): 51 mg/dL (ref 0–99)
Triglycerides: 85 mg/dL (ref 0–149)
VLDL Cholesterol Cal: 17 mg/dL (ref 5–40)

## 2021-01-08 LAB — HEMOGLOBIN A1C
Est. average glucose Bld gHb Est-mCnc: 85 mg/dL
Hgb A1c MFr Bld: 4.6 % — ABNORMAL LOW (ref 4.8–5.6)

## 2021-01-08 MED ORDER — FERROUS SULFATE 325 (65 FE) MG PO TABS: 325.0000 mg | ORAL_TABLET | Freq: Every day | ORAL | 1 refills | Status: DC

## 2021-01-10 LAB — BASIC METABOLIC PANEL
BUN: 19 mg/dL (ref 7–25)
CO2: 24 mmol/L (ref 20–32)
Calcium: 9.1 mg/dL (ref 8.6–10.2)
Chloride: 105 mmol/L (ref 98–110)
Creat: 0.81 mg/dL (ref 0.50–0.99)
Glucose, Bld: 81 mg/dL (ref 65–139)
Potassium: 4.1 mmol/L (ref 3.5–5.3)
Sodium: 135 mmol/L (ref 135–146)

## 2021-01-10 LAB — HIV-1 RNA QUANT-NO REFLEX-BLD
HIV 1 RNA Quant: <20 Copies/mL — ABNORMAL HIGH
HIV-1 RNA Quant, Log: <1.3 Log cps/mL — ABNORMAL HIGH

## 2021-01-21 ENCOUNTER — Telehealth: Payer: Self-pay | Admitting: Neurology

## 2021-01-21 NOTE — Telephone Encounter (Signed)
Referral for orthopedics was sent to [facility] end of July. I was contacted by their office due to patient's insurance being out of network. She has [place] in the [employer]. I sent patient's referral to [facility] in [place]. They will call patient to schedule. Phone: [phone].

## 2021-01-29 ENCOUNTER — Other Ambulatory Visit: Payer: Self-pay | Admitting: Pharmacist

## 2021-02-18 ENCOUNTER — Other Ambulatory Visit: Payer: Self-pay | Admitting: Infectious Diseases

## 2021-03-31 ENCOUNTER — Other Ambulatory Visit: Payer: Self-pay

## 2021-03-31 ENCOUNTER — Encounter: Payer: Self-pay | Admitting: Infectious Diseases

## 2021-03-31 ENCOUNTER — Ambulatory Visit (INDEPENDENT_AMBULATORY_CARE_PROVIDER_SITE_OTHER): Payer: Self-pay | Admitting: Infectious Diseases

## 2021-03-31 VITALS — BP 108/72 | HR 72 | Temp 97.9°F | Resp 16 | Ht 65.0 in | Wt 191.6 lb

## 2021-03-31 DIAGNOSIS — Z23 Encounter for immunization: Secondary | ICD-10-CM

## 2021-03-31 DIAGNOSIS — B582 Toxoplasma meningoencephalitis: Secondary | ICD-10-CM

## 2021-03-31 DIAGNOSIS — Z5181 Encounter for therapeutic drug level monitoring: Secondary | ICD-10-CM

## 2021-03-31 DIAGNOSIS — B20 Human immunodeficiency virus [HIV] disease: Secondary | ICD-10-CM

## 2021-03-31 MED ORDER — DOVATO 50-300 MG PO TABS: 1.0000 | ORAL_TABLET | Freq: Every day | ORAL | 3 refills | Status: DC

## 2021-03-31 MED ORDER — SULFAMETHOXAZOLE-TRIMETHOPRIM 800-160 MG PO TABS: 1.0000 | ORAL_TABLET | Freq: Two times a day (BID) | ORAL | 3 refills | Status: DC

## 2021-03-31 NOTE — Assessment & Plan Note (Signed)
Continue on Keppra 750 mg BID through next neurology visit. Suspect that she had a short term split rx with 500/250 mg. Crossed out the 250 mg dose for her today and instructed to continue 750 mg total twice daily. She understood this.   Continue bactrim BID for secondary prophylaxis. Follow CD4 response and timing to stop this after immune recovery. She will for now continue this another 3 months.

## 2021-03-31 NOTE — Patient Instructions (Addendum)
  STOP the 250 mg pill of the Keppra. Please only take the 750 mg tablet twice a day.   I think your watery itchy eyes are due to allergies. You can try taking an over the counter medication called Claritin or Zyrtec once a day to see if this helps.   Please continue your Dovato every day   I would like to see you back in 3 months please.

## 2021-03-31 NOTE — Assessment & Plan Note (Signed)
Recheck BMP today to follow K+ and creatinine on chronic bactrim secondary prevention for toxoplasmosis.

## 2021-03-31 NOTE — Progress Notes (Signed)
Name: Candace Jones  DOB: Aug 18, 1980 MRN: [id number] PCP: Storm Frisk, MD    Brief Narrative:  Candace Jones is a 40 y.o. 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      Subjective:   Chief Complaint  Patient presents with   Follow-up       HPI: [name] interpretation via professional telephone service.   Asiyah is here alone for follow-up HIV/AIDS, Toxoplasmosis encephalitis (completed induciton therapy and on 2x daily DS bactrim for maintenance).    She has her medicines here today - has different doses of the keppra (750 mg and 250 mg). She has dovato and bactrim with her today. She thinks she had 500 mg tabs of keppra in the past but does not have a bottle for this. Rx was given by nsgy in Sept 2022.   She has not been able to follow up with ophthalmology for blurry vision due to finances. She reports this problem is not worsening. She does have some watery and itchy eyes that has been troubling her lately.   Overall feels improved over the last 3 months. Weight regain noted. Eating and sleeping well. Requesting for some assistance with paying bills for now as she is still out of work. Working with Geneticist, molecular and requesting help with letter to support she has been out of work with medical care since April.   Wt Readings from Last 3 Encounters:  03/31/21 191 lb 9.6 oz (86.9 kg)  01/07/21 178 lb (80.7 kg)  01/06/21 178 lb 9.6 oz (81 kg)     Review of Systems  Constitutional:  Negative for chills, diaphoresis and fever.  HENT:  Negative for facial swelling.   Eyes:  Negative for photophobia and visual disturbance.        Blurry vision +  Respiratory:  Negative for cough and shortness of breath.   Cardiovascular:  Negative for chest pain.  Gastrointestinal:  Negative for abdominal distention, abdominal pain, constipation, diarrhea and nausea.  Genitourinary:  Negative for flank pain and pelvic pain.  Musculoskeletal:  Negative for back pain, myalgias and neck pain.  Neurological:  Negative for dizziness, tremors, seizures, facial asymmetry, speech difficulty, weakness, light-headedness and headaches.  Hematological:  Negative for adenopathy.  Psychiatric/Behavioral:  Negative for confusion, dysphoric mood and sleep disturbance.      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 60 tablet 5   omeprazole (PRILOSEC) 20 MG capsule Take 20 mg by mouth daily.     Dolutegravir-lamiVUDine (DOVATO) 50-300 MG TABS Take 1 tablet by mouth daily. 30 tablet 3   ferrous sulfate 325 (65 FE) MG tablet Take 1 tablet (325 mg total) by mouth  daily with breakfast. 90 tablet 1   levETIRAcetam (KEPPRA) 250 MG tablet Take 250 mg by mouth 2 (two) times daily.     sulfamethoxazole-trimethoprim (BACTRIM DS) 800-160 MG tablet Take 1 tablet by mouth 2 (two) times daily.     No facility-administered medications prior to visit.     No Known Allergies  Social History   Tobacco Use   Smoking status: Never   Smokeless tobacco: Never  Substance Use Topics   Alcohol use: Never   Drug use: Never    Family History  Family history unknown: Yes    Social History   Substance and Sexual Activity   Sexual Activity Yes   Birth control/protection: None     Objective:   Vitals:   03/31/21 1046  BP: 108/72  Pulse: 72  Resp: 16  Temp: 97.9 F (36.6 C)  TempSrc: Temporal  SpO2: 97%  Weight: 191 lb 9.6 oz (86.9 kg)  Height: $Remove'5\' 5"'htVkXzS$  (1.651 m)   Body mass index is 31.88 kg/m.  Physical Exam Vitals reviewed.  HENT:     Mouth/Throat:     Mouth: No oral lesions.     Dentition: No dental abscesses.  Cardiovascular:     Rate and Rhythm: Normal rate and regular rhythm.     Heart sounds: Normal heart sounds.  Pulmonary:     Effort: Pulmonary effort is normal.     Breath sounds: Normal breath sounds.  Abdominal:     General: There is no distension.     Palpations: Abdomen is soft.     Tenderness: There is no abdominal tenderness.  Musculoskeletal:        General: No tenderness. Normal range of motion.  Lymphadenopathy:     Cervical: No cervical adenopathy.  Skin:    General: Skin is warm and dry.     Findings: No rash.  Neurological:     Mental Status: She is alert and oriented to person, place, and time.  Psychiatric:        Judgment: Judgment normal.     Lab Results Lab Results  Component Value Date   WBC 3.2 (L) 01/07/2021   HGB 10.5 (L) 01/07/2021   HCT 33.1 (L) 01/07/2021   MCV 90 01/07/2021   PLT 173 01/07/2021    Lab Results  Component Value Date   CREATININE 0.69 01/07/2021   BUN 16 01/07/2021   NA 138 01/07/2021   K 4.2 01/07/2021   CL 104 01/07/2021   CO2 21 01/07/2021    Lab Results  Component Value Date   ALT 18 01/07/2021   AST 22 01/07/2021   ALKPHOS 79 01/07/2021   BILITOT 0.3 01/07/2021    Lab Results  Component Value Date   CHOL 123 01/07/2021   HDL 55 01/07/2021   LDLCALC 51 01/07/2021   TRIG 85 01/07/2021   CHOLHDL 2.2 01/07/2021   HIV 1 RNA Quant  Date Value  01/06/2021 <20 Copies/mL (H)  12/04/2020 <20 Copies/mL (H)  11/06/2020 100 copies/mL   CD4 T Cell Abs (/uL)  Date Value  01/06/2021 75 (L)     Assessment &  Plan:   Problem List Items Addressed This Visit       1.   AIDS (acquired immune deficiency syndrome) ([place]) - Primary    She has had an excellent response to treatment with quickly suppressed HIV virus. Will check CD4 and VL today for monitoring. Continue Dovato once daily.  Bactrim as outlined above for PJP and secondary toxo prevention.  Will need pap smear in June 2023. After 3 normal annual screenings can progress to Q22yr.  Flu shot today.  Will see what CD4 recovery looks like before pneumonia and other recommended vaccines for PLWH.   Return in about 3 months (around 07/01/2021).       Relevant Medications   dolutegravir-lamiVUDine (DOVATO) 50-300 MG tablet   sulfamethoxazole-trimethoprim (BACTRIM DS) 800-160 MG tablet   Other Relevant Orders   Basic metabolic panel   T-helper cells (CD4) count   HIV 1 RNA quant-no reflex-bld   AMB REFERRAL TO COMMUNITY SERVICE AGENCY     Unprioritized   Therapeutic drug monitoring    Recheck BMP today to follow K+ and creatinine on chronic bactrim secondary prevention for toxoplasmosis.       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 Suspect that she had a short term split rx with 500/250 mg. Crossed out the 250 mg dose for her today and instructed to continue 750 mg total twice daily. She understood this.   Continue bactrim BID for secondary prophylaxis. Follow CD4 response and timing to stop this after immune recovery. She will for now continue this another 3 months.       Relevant Medications   dolutegravir-lamiVUDine (DOVATO) 50-300 MG tablet   sulfamethoxazole-trimethoprim (BACTRIM DS) 800-160 MG tablet    Janene Madeira, MSN, NP-C [facility] for Infectious [place] Pager: [phone] Office: [phone]  03/31/21  1:51 PM

## 2021-03-31 NOTE — Assessment & Plan Note (Signed)
She has had an excellent response to treatment with quickly suppressed HIV virus. Will check CD4 and VL today for monitoring. Continue Dovato once daily.  Bactrim as outlined above for PJP and secondary toxo prevention.  Will need pap smear in June 2023. After 3 normal annual screenings can progress to Q51yr.  Flu shot today.  Will see what CD4 recovery looks like before pneumonia and other recommended vaccines for PLWH.   Return in about 3 months (around 07/01/2021).

## 2021-04-01 LAB — T-HELPER CELLS (CD4) COUNT (NOT AT ARMC)
CD4 % Helper T Cell: 10 % — ABNORMAL LOW (ref 33–65)
CD4 T Cell Abs: 121 /uL — ABNORMAL LOW (ref 400–1790)

## 2021-04-02 LAB — BASIC METABOLIC PANEL
BUN: 11 mg/dL (ref 7–25)
CO2: 27 mmol/L (ref 20–32)
Calcium: 8.8 mg/dL (ref 8.6–10.2)
Chloride: 104 mmol/L (ref 98–110)
Creat: 0.64 mg/dL (ref 0.50–0.99)
Glucose, Bld: 76 mg/dL (ref 65–99)
Potassium: 4 mmol/L (ref 3.5–5.3)
Sodium: 135 mmol/L (ref 135–146)

## 2021-04-02 LAB — HIV-1 RNA QUANT-NO REFLEX-BLD
HIV 1 RNA Quant: <20 Copies/mL — ABNORMAL HIGH
HIV-1 RNA Quant, Log: <1.3 Log cps/mL — ABNORMAL HIGH

## 2021-04-03 ENCOUNTER — Encounter: Payer: Self-pay | Admitting: Infectious Diseases

## 2021-05-11 ENCOUNTER — Other Ambulatory Visit: Payer: Self-pay

## 2021-05-11 ENCOUNTER — Other Ambulatory Visit (HOSPITAL_COMMUNITY): Payer: Self-pay

## 2021-05-11 ENCOUNTER — Encounter: Payer: Self-pay | Admitting: Critical Care Medicine

## 2021-05-11 ENCOUNTER — Ambulatory Visit: Payer: Medicaid Other | Attending: Critical Care Medicine | Admitting: Critical Care Medicine

## 2021-05-11 VITALS — BP 110/75 | HR 79 | Resp 16 | Wt 200.2 lb

## 2021-05-11 DIAGNOSIS — B582 Toxoplasma meningoencephalitis: Secondary | ICD-10-CM

## 2021-05-11 DIAGNOSIS — R569 Unspecified convulsions: Secondary | ICD-10-CM | POA: Diagnosis not present

## 2021-05-11 DIAGNOSIS — B20 Human immunodeficiency virus [HIV] disease: Secondary | ICD-10-CM

## 2021-05-11 MED ORDER — SULFAMETHOXAZOLE-TRIMETHOPRIM 800-160 MG PO TABS: 1.0000 | ORAL_TABLET | Freq: Two times a day (BID) | ORAL | 3 refills | Status: DC

## 2021-05-11 MED ORDER — LEVETIRACETAM 750 MG PO TABS: 750.0000 mg | ORAL_TABLET | Freq: Two times a day (BID) | ORAL | 5 refills | Status: DC

## 2021-05-11 NOTE — Assessment & Plan Note (Signed)
No recent seizures continue Keppra this was refilled

## 2021-05-11 NOTE — Patient Instructions (Signed)
Refills on your Bactrim and Keppra were sent to the [facility]  I will communicate with your infectious disease provider about your barriers getting your HIV medicines refilled  Continue to use the eyedrops as prescribed by your optometrist  Return to see Dr. Joya Gaskins 4 months  [employer] votre Bactrim et Keppra ont t envoyes aux [facility]  Je communiquerai avec votre fournisseur de maladies infectieuses au sujet de vos obstacles au renouvellement de vos mdicaments anti-VIH  [employer]  utiliser les gouttes oculaires comme prescrit par votre optomtriste  Retourner voir le Dr Joya Gaskins [facility]

## 2021-05-11 NOTE — Progress Notes (Signed)
Established Patient Office Visit  Subjective:  Patient ID: Candace Jones, female    DOB: 1980-12-17  Age: 40 y.o. MRN: [id number]  CC: PCP f/u   HPI Candace Jones presents for primary care follow-up visit.  This visit was assisted by [name] interpreter video [phone].  Patient has a history of HIV disorder with prior history of inability to tolerate certain medications.  She was just seen by regional center for infectious disease.  Note this patient presented in April of this year with a CD4 count less than 35 and had toxoplasmosis of the brain.  She subsequently had hospitalizations for acute kidney injury.  Below is documentation from the last infectious disease visit.  09/2020 with CD4 < 35 Risk: sexual, endemic area History of OIs: CNS Toxo (Tx started 4/22) Intake Labs 09-2020: Hep B sAg (-), sAb (+), cAb (-); Hep A (+), Hep C (-) Quantiferon () HLA B*5701 () G6PD: (normal) Toxo IgG: (+)      Previous Regimens: Biktarvy 09/2020 Dovato 11/2020 d/t AKI    Genotypes: 09/2020 - hospitalized and not performed.    Candace Jones is here alone for follow-up HIV/AIDS, Toxoplasmosis encephalitis (completed induciton therapy and on 2x daily DS bactrim for maintenance).     She has her medicines here today - has different doses of the keppra (750 mg and 250 mg). She has dovato and bactrim with her today. She thinks she had 500 mg tabs of keppra in the past but does not have a bottle for this. Rx was given by nsgy in Sept 2022.    She has not been able to follow up with ophthalmology for blurry vision due to finances. She reports this problem is not worsening. She does have some watery and itchy eyes that has been troubling her lately.    Overall feels improved over the last 3 months. Weight regain noted. Eating and sleeping well. Requesting for some assistance with paying bills for now as she is still out of work. Working with Geneticist, molecular and requesting help with letter to support she  has been out of work with medical care since April.   AIDS (acquired immune deficiency syndrome) (HCC) - Primary       She has had an excellent response to treatment with quickly suppressed HIV virus. Will check CD4 and VL today for monitoring. Continue Dovato once daily.  Bactrim as outlined above for PJP and secondary toxo prevention.  Will need pap smear in June 2023. After 3 normal annual screenings can progress to Q69yr.  Flu shot today.  Will see what CD4 recovery looks like before pneumonia and other recommended vaccines for PLWH.    Return in about 3 months (around 07/01/2021).         Relevant Medications    dolutegravir-lamiVUDine (DOVATO) 50-300 MG tablet    sulfamethoxazole-trimethoprim (BACTRIM DS) 800-160 MG tablet    Other Relevant Orders    Basic metabolic panel    T-helper cells (CD4) count    HIV 1 RNA quant-no reflex-bld    AMB REFERRAL TO COMMUNITY SERVICE AGENCY        Unprioritized    Therapeutic drug monitoring      Recheck BMP today to follow K+ and creatinine on chronic bactrim secondary prevention for toxoplasmosis.         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HCC)      Continue on Keppra 750 mg BID through next neurology visit. Suspect that she had a short term split rx with 500/250  mg. Crossed out the 250 mg dose for her today and instructed to continue 750 mg total twice daily. She understood this.    Continue bactrim BID for secondary prophylaxis. Follow CD4 response and timing to stop this after immune recovery. She will for now continue this another 3 months.         Relevant Medications    dolutegravir-lamiVUDine (DOVATO) 50-300 MG tablet    sulfamethoxazole-trimethoprim (BACTRIM DS) 800-160 MG tablet    Note this patient struggles to get her medications refilled at the [facility] as she is self-pay.  She states that she is able to afford the generic Keppra and Bactrim prescriptions there are only a few dollars however she cannot afford the oral antiviral HIV  drug.  She is about to run out of this medication and needs paperwork from the infectious disease clinic.  She does not sure how to obtain this.  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[place]) 10/02/2020    Past Surgical History:  Procedure Laterality Date   APPLICATION OF CRANIAL NAVIGATION Left 10/02/2020   Procedure: APPLICATION OF CRANIAL NAVIGATION;  Surgeon: Newman Pies, MD;  Location: [facility];  Service: Neurosurgery;  Laterality: Left;   buttock surgery     CRANIOTOMY Left 10/02/2020   Procedure: LEFT CRANIOTOMY FOR TUMOR BIOPSY/ RESECTION with BrainLab;  Surgeon: Newman Pies, MD;  Location: [facility];  Service: Neurosurgery;  Laterality: Left;   IR RADIOLOGIST EVAL & MGMT  10/17/2020   IR RADIOLOGIST EVAL & MGMT  12/02/2020    Family History  Family history unknown: Yes    Social History   Socioeconomic History   Marital status: Single    Spouse name: Not on file   Number of children: Not on file   Years of education: Not on file   Highest education level: Not on file  Occupational History   Not on file  Tobacco Use   Smoking status: Never   Smokeless tobacco: Never  Substance and Sexual Activity   Alcohol use: Never   Drug use: Never   Sexual activity: Yes    Birth control/protection: None  Other Topics Concern   Not on file  Social History Narrative   Lives at home with daughter.  Working:  not working since April 2022 since surgery.  Education:  HS in [place].  [employer].   Social Determinants of Health   Financial Resource Strain: Not on file  Food Insecurity: Not on file  Transportation Needs:  Not on file  Physical Activity: Not on file  Stress: Not on file  Social Connections: Not on file  Intimate Partner Violence: Not on file    Outpatient Medications Prior to Visit  Medication Sig Dispense Refill   Brimonidine Tartrate (LUMIFY) 0.025 % SOLN Apply to eye.     dolutegravir-lamiVUDine (DOVATO) 50-300 MG tablet Take 1 tablet by mouth daily. 30 tablet 3   Olopatadine HCl (PATADAY) 0.2 % SOLN Apply to eye.     levETIRAcetam (KEPPRA) 750 MG tablet Take 1 tablet (750 mg total) by mouth 2 (two) times daily. 60 tablet 5   sulfamethoxazole-trimethoprim (BACTRIM  DS) 800-160 MG tablet Take 1 tablet by mouth 2 (two) times daily. 60 tablet 3   docusate sodium (COLACE) 100 MG capsule TAKE 1 CAPSULE(100 MG) BY MOUTH TWICE DAILY (Patient not taking: Reported on 05/11/2021) 60 capsule 2   omeprazole (PRILOSEC) 20 MG capsule Take 20 mg by mouth daily. (Patient not taking: Reported on 05/11/2021)     No facility-administered medications prior to visit.    Allergies  Allergen Reactions   Tenofovir Other (See Comments)    Patient started on Biktarvy - acute renal failure shortly after with creatinine 5.2. Unclear if related to TAF but would approach with caution if re-trialed.     ROS Review of Systems  Constitutional:  Negative for chills, diaphoresis and fever.  HENT:  Negative for congestion, hearing loss, nosebleeds, sore throat and tinnitus.   Eyes:  Negative for photophobia and redness.  Respiratory:  Negative for cough, shortness of breath, wheezing and stridor.   Cardiovascular:  Negative for chest pain, palpitations and leg swelling.  Gastrointestinal:  Negative for abdominal pain, blood in stool, constipation, diarrhea, nausea and vomiting.  Endocrine: Negative for polydipsia.  Genitourinary:  Negative for dysuria, flank pain, frequency, hematuria and urgency.  Musculoskeletal:  Negative for back pain, myalgias and neck pain.  Skin:  Negative for rash.  Allergic/Immunologic:  Negative for environmental allergies.  Neurological:  Negative for dizziness, tremors, seizures, weakness and headaches.  Hematological:  Does not bruise/bleed easily.  Psychiatric/Behavioral:  Negative for suicidal ideas. The patient is not nervous/anxious.      Objective:    Physical Exam Vitals reviewed.  Constitutional:      Appearance: Normal appearance. She is well-developed. She is not diaphoretic.  HENT:     Head: Normocephalic and atraumatic.     Nose: No nasal deformity, septal deviation, mucosal edema or rhinorrhea.     Right Sinus: No maxillary sinus tenderness or frontal sinus tenderness.     Left Sinus: No maxillary sinus tenderness or frontal sinus tenderness.     Mouth/Throat:     Pharynx: No oropharyngeal exudate.  Eyes:     General: No scleral icterus.    Conjunctiva/sclera: Conjunctivae normal.     Pupils: Pupils are equal, round, and reactive to light.  Neck:     Thyroid: No thyromegaly.     Vascular: No carotid bruit or JVD.     Trachea: Trachea normal. No tracheal tenderness or tracheal deviation.  Cardiovascular:     Rate and Rhythm: Normal rate and regular rhythm.     Chest Wall: PMI is not displaced.     Pulses: Normal pulses. No decreased pulses.     Heart sounds: Normal heart sounds, S1 normal and S2 normal. Heart sounds not distant. No murmur heard. No systolic murmur is present.  No diastolic murmur is present.    No friction rub. No gallop. No S3 or S4 sounds.  Pulmonary:     Effort: Pulmonary effort is normal. No tachypnea, accessory muscle usage or respiratory distress.     Breath sounds: Normal breath sounds. No stridor. No decreased breath sounds, wheezing, rhonchi or rales.  Chest:     Chest wall: No tenderness.  Abdominal:     General: Bowel sounds are normal. There is no distension.     Palpations: Abdomen is soft. Abdomen is not rigid.     Tenderness: There is no abdominal tenderness. There is no guarding or rebound.  Musculoskeletal:         General: Normal range of  motion.     Cervical back: Normal range of motion and neck supple. No edema, erythema or rigidity. No muscular tenderness. Normal range of motion.  Lymphadenopathy:     Head:     Right side of head: No submental or submandibular adenopathy.     Left side of head: No submental or submandibular adenopathy.     Cervical: No cervical adenopathy.  Skin:    General: Skin is warm and dry.     Coloration: Skin is not pale.     Findings: No rash.     Nails: There is no clubbing.  Neurological:     Mental Status: She is alert and oriented to person, place, and time.     Sensory: No sensory deficit.  Psychiatric:        Speech: Speech normal.        Behavior: Behavior normal.    BP 110/75   Pulse 79   Resp 16   Wt 200 lb 3.2 oz (90.8 kg)   SpO2 99%   BMI 33.32 kg/m  Wt Readings from Last 3 Encounters:  05/11/21 200 lb 3.2 oz (90.8 kg)  03/31/21 191 lb 9.6 oz (86.9 kg)  01/07/21 178 lb (80.7 kg)     There are no preventive care reminders to display for this patient.   There are no preventive care reminders to display for this patient.  No results found for: TSH Lab Results  Component Value Date   WBC 3.2 (L) 01/07/2021   HGB 10.5 (L) 01/07/2021   HCT 33.1 (L) 01/07/2021   MCV 90 01/07/2021   PLT 173 01/07/2021   Lab Results  Component Value Date   NA 135 03/31/2021   K 4.0 03/31/2021   CO2 27 03/31/2021   GLUCOSE 76 03/31/2021   BUN 11 03/31/2021   CREATININE 0.64 03/31/2021   BILITOT 0.3 01/07/2021   ALKPHOS 79 01/07/2021   AST 22 01/07/2021   ALT 18 01/07/2021   PROT 7.9 01/07/2021   ALBUMIN 4.0 01/07/2021   CALCIUM 8.8 03/31/2021   ANIONGAP 7 11/15/2020   EGFR 112 01/07/2021   Lab Results  Component Value Date   CHOL 123 01/07/2021   Lab Results  Component Value Date   HDL 55 01/07/2021   Lab Results  Component Value Date   LDLCALC 51 01/07/2021   Lab Results  Component Value Date   TRIG 85 01/07/2021   Lab  Results  Component Value Date   CHOLHDL 2.2 01/07/2021   Lab Results  Component Value Date   HGBA1C 4.6 (L) 01/07/2021      Assessment & Plan:   Problem List Items Addressed This Visit       Nervous and Auditory   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[place])    Patient still on seizure medications has follow-up neurology visits  Patient currently off antibiotics for toxoplasmosis  Per neurology  Refill Keppra      Relevant Medications   levETIRAcetam (KEPPRA) 750 MG tablet   sulfamethoxazole-trimethoprim (BACTRIM DS) 800-160 MG tablet     Other   Seizure (HCC)    No recent seizures continue Keppra this was refilled      Relevant Medications   levETIRAcetam (KEPPRA) 750 MG tablet   AIDS (acquired immune deficiency syndrome) (HCC)    CD4 counts are improving patient is to continue the Dovato  She will need patient assistance I will inquire with infectious disease the process for this the patient states she is unclear what to do next  he is about out of the medication note there is some degree of language barrier despite using [place] which she says she understands I am not 100% sure she is grasping the information      Relevant Medications   sulfamethoxazole-trimethoprim (BACTRIM DS) 800-160 MG tablet    Meds ordered this encounter  Medications   levETIRAcetam (KEPPRA) 750 MG tablet    Sig: Take 1 tablet (750 mg total) by mouth 2 (two) times daily.    Dispense:  60 tablet    Refill:  5    Patient has ADAP and should be on formulary.   sulfamethoxazole-trimethoprim (BACTRIM DS) 800-160 MG tablet    Sig: Take 1 tablet by mouth 2 (two) times daily.    Dispense:  60 tablet    Refill:  3   Need to inquire whether use of a monoclonal antibody would be useful in this patient to prevent COVID infection since she is on vaccinated Follow-up: Return in about 4 months (around 09/08/2021).    Asencion Noble, MD

## 2021-05-11 NOTE — Assessment & Plan Note (Signed)
CD4 counts are improving patient is to continue the Dovato  She will need patient assistance I will inquire with infectious disease the process for this the patient states she is unclear what to do next he is about out of the medication note there is some degree of language barrier despite using [place] which she says she understands I am not 100% sure she is grasping the information

## 2021-05-11 NOTE — Assessment & Plan Note (Signed)
Patient still on seizure medications has follow-up neurology visits  Patient currently off antibiotics for toxoplasmosis  Per neurology  Refill Keppra

## 2021-05-14 ENCOUNTER — Other Ambulatory Visit (HOSPITAL_COMMUNITY): Payer: Self-pay

## 2021-05-15 ENCOUNTER — Other Ambulatory Visit (HOSPITAL_COMMUNITY): Payer: Self-pay

## 2021-06-16 ENCOUNTER — Other Ambulatory Visit: Payer: Self-pay

## 2021-06-16 ENCOUNTER — Ambulatory Visit
Admission: RE | Admit: 2021-06-16 | Discharge: 2021-06-16 | Disposition: A | Discharge status: Home / Self Care | Payer: Medicaid Other | Source: Ambulatory Visit | Attending: Family Medicine | Admitting: Family Medicine

## 2021-06-16 DIAGNOSIS — Z1231 Encounter for screening mammogram for malignant neoplasm of breast: Secondary | ICD-10-CM

## 2021-06-30 ENCOUNTER — Encounter: Payer: Self-pay | Admitting: Infectious Diseases

## 2021-06-30 ENCOUNTER — Other Ambulatory Visit: Payer: Self-pay

## 2021-06-30 ENCOUNTER — Other Ambulatory Visit (HOSPITAL_COMMUNITY): Payer: Self-pay

## 2021-06-30 ENCOUNTER — Ambulatory Visit (INDEPENDENT_AMBULATORY_CARE_PROVIDER_SITE_OTHER): Payer: Medicaid Other | Admitting: Infectious Diseases

## 2021-06-30 VITALS — BP 127/82 | HR 88 | Temp 97.7°F | Wt 208.0 lb

## 2021-06-30 DIAGNOSIS — R3589 Other polyuria: Secondary | ICD-10-CM

## 2021-06-30 DIAGNOSIS — R102 Pelvic and perineal pain: Secondary | ICD-10-CM | POA: Diagnosis not present

## 2021-06-30 DIAGNOSIS — B582 Toxoplasma meningoencephalitis: Secondary | ICD-10-CM

## 2021-06-30 DIAGNOSIS — R569 Unspecified convulsions: Secondary | ICD-10-CM

## 2021-06-30 DIAGNOSIS — B20 Human immunodeficiency virus [HIV] disease: Secondary | ICD-10-CM | POA: Diagnosis not present

## 2021-06-30 DIAGNOSIS — K611 Rectal abscess: Secondary | ICD-10-CM | POA: Diagnosis present

## 2021-06-30 NOTE — Assessment & Plan Note (Addendum)
Her immune system continues to make slow strides for a recovery. Last CD4 in October up to 121. Will repeat today. Continue the bactrim 1 DS tab BID for prophylaxis.  No new concerning features for OI.   Continue dovato daily - she has been getting her medications without problem. Knows to reach out here if she has trouble. Repeat VL today.

## 2021-06-30 NOTE — Patient Instructions (Addendum)
Continue your Dovato once a day Continue your bactrim twice a day Continue your keppra twice a day   Will schedule a scan of your pelvis to make sure there is nothing concerning for infection like before   Please come back in 3 months to check in again.     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

## 2021-06-30 NOTE — Assessment & Plan Note (Addendum)
Gradual onset of posterior pelvic pain with associated polyuria and what sounds like some incontinence at times. It is not clear if she is leaking urine or has other drainage noted. Given history of perirectal abscess in the past and enlarged uterus d/t fibroids will arrange a repeat CT of the pelvis.  Low likelihood but for completeness will check A1C with polyuria to ensure no diabetes (has gained > 30 lb since last year with health recovery on ARV).  Check creatinine today to ensure safe to use contrasted study.

## 2021-06-30 NOTE — Progress Notes (Signed)
Name: Candace Jones  DOB: 04/03/1981 MRN: [id number] PCP: Elsie Stain, MD    Brief Narrative:  Candace Jones is a 41 y.o. 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      Subjective:   Chief Complaint  Patient presents with   Follow-up    Given condoms       HPI: [place] interpretation via professional telephone service.   Candace Jones is here alone for follow-up HIV/AIDS, Toxoplasmosis encephalitis (completed induciton therapy and on 2x daily DS bactrim for maintenance).    Had some pain in the breasts after mammogram 2 weeks ago - normal test results. Both breasts. Menstrual cycle was also on at that time too. Breasts were dense but no tumor.   Has been getting her Dovato from [facility] monthly and is taking it once every night. Continues Bactrim for secondary prophylaxis twice a day. Continues on Keppra BID - has FU appt with neurology in about 2 weeks.   Has had some sacral/pelvic pain "on the inside" with increased urinary leakage. She wakes at night frequently with "so much urine". She feels like she has intermittent / involuntary drainage that is yellow looking. No chills/fevers or sweating noted. Does not feel like it is muscle or bone pain. Feels like it "hurts on the inside." Squatting makes it worse. Having a full bladder makes it worse.   Wt Readings from Last 3 Encounters:  06/30/21 208 lb (94.3 kg)  05/11/21 200 lb 3.2 oz (90.8 kg)  03/31/21 191 lb 9.6 oz (86.9 kg)   She wants a note to help her get back to working. Had been denied unemployment in the past but she feels strong and ready to get back to actually  working. Needs the income. Does not want to be on disability.    Review of Systems  Constitutional:  Negative for chills, diaphoresis and fever.  HENT:  Negative for facial swelling.   Eyes:  Negative for photophobia and visual disturbance.       Blurry vision +  Respiratory:  Negative for cough and shortness of breath.   Cardiovascular:  Negative for chest pain.  Gastrointestinal:  Negative for abdominal distention, abdominal pain, constipation, diarrhea and nausea.  Endocrine: Positive for polyuria. Negative for polydipsia.  Genitourinary:  Positive for difficulty urinating and pelvic pain. Negative for flank pain, menstrual problem and vaginal bleeding.  Musculoskeletal:  Negative for back pain, myalgias and neck pain.  Neurological:  Negative for dizziness, tremors, seizures, facial asymmetry, speech difficulty, weakness, light-headedness and headaches.  Hematological:  Negative for adenopathy.  Psychiatric/Behavioral:  Negative for confusion, dysphoric mood and sleep disturbance.      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 30 tablet 3   levETIRAcetam (KEPPRA) 750 MG tablet Take 1 tablet (750 mg total) by mouth  2 (two) times daily. 60 tablet 5   sulfamethoxazole-trimethoprim (BACTRIM DS) 800-160 MG tablet Take 1 tablet by mouth 2 (two) times daily. 60 tablet 3   Brimonidine Tartrate (LUMIFY) 0.025 % SOLN Apply to eye. (Patient not taking: Reported on 06/30/2021)     Olopatadine HCl 0.2 % SOLN Apply to eye. (Patient not taking: Reported on 06/30/2021)     No facility-administered medications prior to visit.     Allergies  Allergen Reactions   Tenofovir Other  (See Comments)    Patient started on Biktarvy - acute renal failure shortly after with creatinine 5.2. Unclear if related to TAF but would approach with caution if re-trialed.     Social History   Tobacco Use   Smoking status: Never   Smokeless tobacco: Never  Substance Use Topics   Alcohol use: Never   Drug use: Never    Family History  Problem Relation Age of Onset   Breast cancer Neg Hx     Social History   Substance and Sexual Activity  Sexual Activity Yes   Birth control/protection: None     Objective:   Vitals:   06/30/21 0944  BP: 127/82  Pulse: 88  Temp: 97.7 F (36.5 C)  TempSrc: Oral  SpO2: 100%  Weight: 208 lb (94.3 kg)   Body mass index is 34.61 kg/m.  Physical Exam Vitals reviewed.  HENT:     Mouth/Throat:     Mouth: No oral lesions.     Dentition: No dental abscesses.  Cardiovascular:     Rate and Rhythm: Normal rate and regular rhythm.     Heart sounds: Normal heart sounds.  Pulmonary:     Effort: Pulmonary effort is normal.     Breath sounds: Normal breath sounds.  Abdominal:     General: There is no distension.     Palpations: Abdomen is soft.     Tenderness: There is no abdominal tenderness.  Musculoskeletal:        General: No tenderness. Normal range of motion.       Back:     Comments: Points to pain posteriorly through her left SI joint. No pain with palpation over joint. She has difficulty getting to a squatting position due to pain she says is inside her pelvis. Similar location to previous transgluteal drain.   Lymphadenopathy:     Cervical: No cervical adenopathy.  Skin:    General: Skin is warm and dry.     Findings: No rash.  Neurological:     Mental Status: She is alert and oriented to person, place, and time.  Psychiatric:        Judgment: Judgment normal.     Lab Results Lab Results  Component Value Date   WBC 3.2 (L) 01/07/2021   HGB 10.5 (L) 01/07/2021   HCT 33.1 (L) 01/07/2021   MCV 90 01/07/2021   PLT  173 01/07/2021    Lab Results  Component Value Date   CREATININE 0.64 03/31/2021   BUN 11 03/31/2021   NA 135 03/31/2021   K 4.0 03/31/2021   CL 104 03/31/2021   CO2 27 03/31/2021    Lab Results  Component Value Date   ALT 18 01/07/2021   AST 22 01/07/2021   ALKPHOS 79 01/07/2021   BILITOT 0.3 01/07/2021    Lab Results  Component Value Date   CHOL 123 01/07/2021   HDL 55 01/07/2021   LDLCALC 51 01/07/2021   TRIG 85 01/07/2021   CHOLHDL 2.2 01/07/2021  HIV 1 RNA Quant (Copies/mL)  Date Value  03/31/2021 <20 (H)  01/06/2021 <20 (H)  12/04/2020 <20 (H)   CD4 T Cell Abs (/uL)  Date Value  03/31/2021 121 (L)  01/06/2021 75 (L)     Assessment & Plan:   Problem List Items Addressed This Visit       High   AIDS (acquired immune deficiency syndrome) ([place])    Her immune system continues to make slow strides for a recovery. Last CD4 in October up to 121. Will repeat today. Continue the bactrim 1 DS tab BID for prophylaxis.  No new concerning features for OI.   Continue dovato daily - she has been getting her medications without problem. Knows to reach out here if she has trouble. Repeat VL today.       Relevant Orders   COMPLETE METABOLIC PANEL WITH GFR   HIV-1 RNA quant-no reflex-bld   T-helper cell (CD4)- ([facility] clinic only)     Unprioritized   Seizure ([place])    Continues on keppra BID for seizure proph in the setting of toxoplasmosis encephalitis. She has neurology follow up in 2 weeks. Hopeful that she can come off this now that her infection has resolved. No seizures.       Pelvic pain - Primary    Gradual onset of posterior pelvic pain with associated polyuria and what sounds like some incontinence at times. It is not clear if she is leaking urine or has other drainage noted. Given history of perirectal abscess in the past and enlarged uterus d/t fibroids will arrange a repeat CT of the pelvis.  Low likelihood but for completeness will check A1C with polyuria  to ensure no diabetes (has gained > 30 lb since last year with health recovery on ARV).  Check creatinine today to ensure safe to use contrasted study.       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       Other Visit Diagnoses     Polyuria       Relevant Orders   Hemoglobin A1c      Janene Madeira, MSN, NP-C [facility] [place] Pager: [phone] Office: [phone]  06/30/21  12:38 PM

## 2021-06-30 NOTE — Assessment & Plan Note (Signed)
Continue secondary prophylaxis with bactrim 1 DS tab BID for now until CD4 > 200 consistently for 46m minimum.

## 2021-06-30 NOTE — Assessment & Plan Note (Signed)
Continues on keppra BID for seizure proph in the setting of toxoplasmosis encephalitis. She has neurology follow up in 2 weeks. Hopeful that she can come off this now that her infection has resolved. No seizures.

## 2021-07-01 LAB — T-HELPER CELL (CD4) - (RCID CLINIC ONLY)
CD4 % Helper T Cell: 13 % — ABNORMAL LOW (ref 33–65)
CD4 T Cell Abs: 147 /uL — ABNORMAL LOW (ref 400–1790)

## 2021-07-02 LAB — HIV-1 RNA QUANT-NO REFLEX-BLD
HIV 1 RNA Quant: NOT DETECTED Copies/mL
HIV-1 RNA Quant, Log: NOT DETECTED Log cps/mL

## 2021-07-02 LAB — COMPLETE METABOLIC PANEL WITH GFR
AG Ratio: 1.1 (calc) (ref 1.0–2.5)
ALT: 14 U/L (ref 6–29)
AST: 18 U/L (ref 10–30)
Albumin: 4 g/dL (ref 3.6–5.1)
Alkaline phosphatase (APISO): 56 U/L (ref 31–125)
BUN: 12 mg/dL (ref 7–25)
CO2: 27 mmol/L (ref 20–32)
Calcium: 8.9 mg/dL (ref 8.6–10.2)
Chloride: 107 mmol/L (ref 98–110)
Creat: 0.7 mg/dL (ref 0.50–0.99)
Globulin: 3.7 g/dL (calc) (ref 1.9–3.7)
Glucose, Bld: 76 mg/dL (ref 65–99)
Potassium: 4.2 mmol/L (ref 3.5–5.3)
Sodium: 137 mmol/L (ref 135–146)
Total Bilirubin: 0.3 mg/dL (ref 0.2–1.2)
Total Protein: 7.7 g/dL (ref 6.1–8.1)
eGFR: 111 mL/min/{1.73_m2} (ref >=60)

## 2021-07-02 LAB — HEMOGLOBIN A1C
Hgb A1c MFr Bld: 4.8 % of total Hgb (ref <5.7)
Mean Plasma Glucose: 91 mg/dL
eAG (mmol/L): 5 mmol/L

## 2021-07-06 NOTE — Progress Notes (Deleted)
PATIENT: Candace Jones DOB: May 14, 1981  REASON FOR VISIT: Follow up HISTORY FROM: Patient PRIMARY NEUROLOGIST:   HISTORY OF PRESENT ILLNESS: Today 07/06/21 Candace Jones here today for follow-up with history of HIV, AIDS, toxoplasmosis infection of the brain.  Had a seizure in April 2022, at the time of toxoplasmosis infection.She underwent left posterior temporal craniectomy for open brain biopsy and cultures by neurosurgery, Dr. Arnoldo Morale on 10/02/2020.   Has remained on Keppra.  HISTORY 01/02/2021 Dr. Jannifer Franklin: Candace Jones is a 41 year old right-handed black African female with a history of HIV infection, AIDS, and a recent toxoplasmosis infection of the brain.  The patient presented to the hospital on 29 September 2020 with right-sided weakness and had a witnessed generalized seizure event in the emergency room.  The patient was noted to have severe edema of the brain affecting the frontal and temporal areas in 2 locations with ring enhancement.  The patient was determined to have an infection from toxoplasmosis.  She has been started on appropriate medical treatment, and the most recent MRI shows good improvement in the edema of the brain.  The patient was placed on Keppra while in the hospital, she remains on 750 mg twice daily.  She seems to tolerate this fairly well.  The patient still reports occasional headaches and some visual blurring.  The headaches are associated with sharp lancinating pains that are quite brief but relatively frequent.  She has light sensitivity.  She has chronic right foot pain following a fall on 16 September 2020.  The x-ray at that time showed a chronic fracture of the navicular bone with sclerosis.  The patient has not had any gait instability or difficulty controlling the bowels or the bladder.  She still has some underlying fatigue.  She is no longer working since the hospitalization.  She does not operate a motor vehicle.  She reports some neck stiffness which is improved with  heat.  She is sent to this office for further evaluation.   REVIEW OF SYSTEMS: Out of a complete 14 system review of symptoms, the patient complains only of the following symptoms, and all other reviewed systems are negative.  ALLERGIES: Allergies  Allergen Reactions   Tenofovir Other (See Comments)    Patient started on Biktarvy - acute renal failure shortly after with creatinine 5.2. Unclear if related to TAF but would approach with caution if re-trialed.     HOME MEDICATIONS: Outpatient Medications Prior to Visit  Medication Sig Dispense Refill   Brimonidine Tartrate (LUMIFY) 0.025 % SOLN Apply to eye. (Patient not taking: Reported on 06/30/2021)     dolutegravir-lamiVUDine (DOVATO) 50-300 MG tablet Take 1 tablet by mouth daily. 30 tablet 3   levETIRAcetam (KEPPRA) 750 MG tablet Take 1 tablet (750 mg total) by mouth 2 (two) times daily. 60 tablet 5   Olopatadine HCl 0.2 % SOLN Apply to eye. (Patient not taking: Reported on 06/30/2021)     sulfamethoxazole-trimethoprim (BACTRIM DS) 800-160 MG tablet Take 1 tablet by mouth 2 (two) times daily. 60 tablet 3   No facility-administered medications prior to visit.    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    PAST SURGICAL HISTORY: Past Surgical History:  Procedure Laterality Date   APPLICATION OF CRANIAL NAVIGATION Left 10/02/2020   Procedure: APPLICATION OF CRANIAL NAVIGATION;  Surgeon:  Newman Pies, MD;  Location: [facility];  Service: Neurosurgery;  Laterality: Left;   buttock surgery     CRANIOTOMY Left 10/02/2020   Procedure: LEFT CRANIOTOMY FOR TUMOR BIOPSY/ RESECTION with BrainLab;  Surgeon: Newman Pies, MD;  Location: [facility];  Service: Neurosurgery;  Laterality: Left;   IR RADIOLOGIST EVAL & MGMT  10/17/2020   IR RADIOLOGIST EVAL & MGMT   12/02/2020    FAMILY HISTORY: Family History  Problem Relation Age of Onset   Breast cancer Neg Hx     SOCIAL HISTORY: Social History   Socioeconomic History   Marital status: Single    Spouse name: Not on file   Number of children: Not on file   Years of education: Not on file   Highest education level: Not on file  Occupational History   Not on file  Tobacco Use   Smoking status: Never   Smokeless tobacco: Never  Substance and Sexual Activity   Alcohol use: Never   Drug use: Never   Sexual activity: Yes    Birth control/protection: None  Other Topics Concern   Not on file  Social History Narrative   Lives at home with daughter.  Working:  not working since April 2022 since surgery.  Education:  HS in [place].  [employer].   Social Determinants of Health   Financial Resource Strain: Not on file  Food Insecurity: Not on file  Transportation Needs: Not on file  Physical Activity: Not on file  Stress: Not on file  Social Connections: Not on file  Intimate Partner Violence: Not on file      PHYSICAL EXAM  There were no vitals filed for this visit. There is no height or weight on file to calculate BMI.  Generalized: Well developed, in no acute distress   Neurological examination  Mentation: Alert oriented to time, place, history taking. Follows all commands speech and language fluent Cranial nerve II-XII: Pupils were equal round reactive to light. Extraocular movements were full, visual field were full on confrontational test. Facial sensation and strength were normal. Uvula tongue midline. Head turning and shoulder shrug  were normal and symmetric. Motor: The motor testing reveals 5 over 5 strength of all 4 extremities. Good symmetric motor tone is noted throughout.  Sensory: Sensory testing is intact to soft touch on all 4 extremities. No evidence of extinction is noted.  Coordination: Cerebellar testing reveals good finger-nose-finger and heel-to-shin  bilaterally.  Gait and station: Gait is normal. Tandem gait is normal. Romberg is negative. No drift is seen.  Reflexes: Deep tendon reflexes are symmetric and normal bilaterally.   DIAGNOSTIC DATA (LABS, IMAGING, TESTING) - I reviewed patient records, labs, notes, testing and imaging myself where available.  Lab Results  Component Value Date   WBC 3.2 (L) 01/07/2021   HGB 10.5 (L) 01/07/2021   HCT 33.1 (L) 01/07/2021   MCV 90 01/07/2021   PLT 173 01/07/2021      Component Value Date/Time   NA 137 06/30/2021 1050   NA 138 01/07/2021 1218   K 4.2 06/30/2021 1050   CL 107 06/30/2021 1050   CO2 27 06/30/2021 1050   GLUCOSE 76 06/30/2021 1050   BUN 12 06/30/2021 1050   BUN 16 01/07/2021 1218   CREATININE 0.70 06/30/2021 1050   CALCIUM 8.9 06/30/2021 1050   PROT 7.7 06/30/2021 1050   PROT 7.9 01/07/2021 1218   ALBUMIN 4.0 01/07/2021 1218   AST 18 06/30/2021 1050   ALT 14 06/30/2021 1050  ALKPHOS 79 01/07/2021 1218   BILITOT 0.3 06/30/2021 1050   BILITOT 0.3 01/07/2021 1218   GFRNONAA 42 (L) 11/15/2020 1016   Lab Results  Component Value Date   CHOL 123 01/07/2021   HDL 55 01/07/2021   LDLCALC 51 01/07/2021   TRIG 85 01/07/2021   CHOLHDL 2.2 01/07/2021   Lab Results  Component Value Date   HGBA1C 4.8 06/30/2021   Lab Results  Component Value Date   JPETKKOE69 507 11/07/2020   No results found for: TSH    ASSESSMENT AND PLAN 41 y.o. 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 here with:  1.  HIV infection, AIDS 2.  Cerebral toxoplasmosis, left brain 3.  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 50% of this time was spent   Butler Denmark, [place], DNP 07/06/2021, 2:17 PM [facility] [address], [place], [place] [address] [phone]

## 2021-07-07 ENCOUNTER — Ambulatory Visit (HOSPITAL_COMMUNITY)
Admission: RE | Admit: 2021-07-07 | Discharge: 2021-07-07 | Disposition: A | Discharge status: Home / Self Care | Payer: Medicaid Other | Source: Ambulatory Visit | Attending: Infectious Diseases | Admitting: Infectious Diseases

## 2021-07-07 ENCOUNTER — Encounter: Payer: Self-pay | Admitting: Neurology

## 2021-07-07 ENCOUNTER — Other Ambulatory Visit: Payer: Self-pay

## 2021-07-07 ENCOUNTER — Ambulatory Visit: Payer: Medicaid Other | Admitting: Neurology

## 2021-07-07 DIAGNOSIS — K611 Rectal abscess: Secondary | ICD-10-CM | POA: Insufficient documentation

## 2021-07-07 MED ORDER — IOHEXOL 300 MG/ML  SOLN
100.0000 mL | Freq: Once | INTRAMUSCULAR | Status: AC | PRN
  Administered 2021-07-07: 14:00:00 100 mL via INTRAVENOUS

## 2021-07-07 MED ORDER — SODIUM CHLORIDE (PF) 0.9 % IJ SOLN
INTRAMUSCULAR | Status: AC
  Filled 2021-07-07: qty 50

## 2021-07-08 ENCOUNTER — Other Ambulatory Visit: Payer: Self-pay | Admitting: Infectious Diseases

## 2021-07-08 ENCOUNTER — Ambulatory Visit: Payer: Self-pay

## 2021-07-08 NOTE — Telephone Encounter (Signed)
°  Chief Complaint: rectal pain Symptoms: rectal pain internally and sometimes itcing Frequency: 2 weeks Pertinent Negatives: denies constipation Disposition: [] ED /[] Urgent Care (no appt availability in office) / [] Appointment(In office/virtual)/ []  [place] [facility]/ [] Home Care/ [] Refused Recommended Disposition /[x] [place] [facility]/ []  Follow-up with PCP Additional Notes: pt has hx of perirectal abscess and unsure if that's causing pain. Had CT yesterday.    Reason for Disposition  MODERATE-SEVERE rectal pain (i.e., interferes with school, work, or sleep)  Answer Assessment - Initial Assessment Questions 1. SYMPTOM:  "What's the main symptom you're concerned about?" (e.g., pain, itching, swelling, rash)     Rectal pain 2. ONSET: "When did the sx  start?"     2 weeks ago  3. RECTAL PAIN: "Do you have any pain around your rectum?" "How bad is the pain?"  (Scale 0-10; or mild, moderate, severe)   - NONE (0): no pain   - MILD (1-3): doesn't interfere with normal activities    - MODERATE (4-7): interferes with normal activities or awakens from sleep, limping    - SEVERE (8-10): excruciating pain, unable to have a bowel movement      10 4. RECTAL ITCHING: "Do you have any itching in this area?" "How bad is the itching?"  (Scale 0-10; or mild, moderate, severe)   - NONE: no itching   - MILD: doesn't interfere with normal activities    - MODERATE-SEVERE: interferes with normal activities or awakens from sleep     sometimes 5. CONSTIPATION: "Do you have constipation?" If Yes, ask: "How bad is it?"     No, LBM yesterday 6. CAUSE: "What do you think is causing the anus symptoms?"      7. OTHER SYMPTOMS: "Do you have any other symptoms?"  (e.g., abdomen pain, fever, rectal bleeding, vomiting)     no  Protocols used: Rectal Symptoms-A-AH

## 2021-07-09 NOTE — Addendum Note (Signed)
Addended by: Cowarts Callas on: 07/09/2021 09:04 AM   Modules accepted: Orders

## 2021-07-09 NOTE — Progress Notes (Signed)
Good morning, Dr. Dione Plover,   You had one point helped me with this nice lady last year re: her enlarged uterus and fibroids. I re-scanned her pelvis as she was having some problems with rectal pain and drainage (apparently she has a recurrent perirectal abscess and I am trying to get her into surgery urgently to manage this).   She is having pelvic discomfort and urinary incontinence that is bothering her and I am sure it is from her very large and heavy uterus.  Her immune system has improved on treatment as has her general health over the last year. Her viral loads have consistently been undetectable as well .I was hoping your team can reach out to her to help arrange a follow up with you to re-consider possible hysterectomy.   If I can help in anyway please let me know,  Janene Madeira, NP [facility]

## 2021-07-09 NOTE — Progress Notes (Signed)
Patient with recurrent perineal/perirectal abscess; previously drained x 2 with percutaneous drain last 11-2020. Patient with worsening rectal pain/pelvic pain and urinary incontinence. Need to arrange urgent outpatient general surgery referral to [facility]. I will place referral order and call the office to see if we can help expedite with history.  Will also reach out to GYN given her markedly enlarged uterus with her pelvic pain and urinary incontinence.   I have called to discuss results with Candace Jones and answered all questions via interpretor [phone].

## 2021-07-09 NOTE — Telephone Encounter (Signed)
Refills increased - will need to stay on this dose for toxo secondary prevention until CD4 reliably > 200. Thank you!

## 2021-07-09 NOTE — Progress Notes (Signed)
07/09/2021   CT scan results noted with recurrent perirectal abscess and larger uterus (measuring up to 18cm) with multiple fibroids.   Called with interpretor to explain referral for general surgery and GYN.  I have sent a message to her GYN to see if they can reach out to her for an appt to discuss further hysterectomy with her pelvic symptoms and urinary incontinence.   I called [facility] to help coordinate an urgent surgical referral for her as well. In discussion with Candace Jones on the phone she has had ongoing rectal discomfort and some yellow thicker drainage she has noticed with wiping. 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  First available urgent appt is Monday Feb 20 at 9:30 am. I asked her to please be put on a cancellation list for earlier appt as well in hopes to avoid ER setting for her.    Candace Madeira, MSN, NP-C [facility] for Infect[facility] Disease [email or web address] Pager: [phone] Office: [phone] [place]: [phone]

## 2021-07-10 ENCOUNTER — Telehealth: Payer: Self-pay | Admitting: Infectious Diseases

## 2021-07-10 DIAGNOSIS — K611 Rectal abscess: Secondary | ICD-10-CM

## 2021-07-10 MED ORDER — AMOXICILLIN-POT CLAVULANATE 875-125 MG PO TABS: 1.0000 | ORAL_TABLET | Freq: Two times a day (BID) | ORAL | 0 refills | Status: DC

## 2021-07-10 NOTE — Telephone Encounter (Signed)
Please call Chrystian with [place] [employer] and let her know that I spoke with the surgery team to review her scan - it is too deep to drain surgically and we need to put the drain back to help her feel better. . I will send in a new antibiotic for her to add to her current medication called Augmentin to take twice a day while the drain is in - please don't have her start this until after her drain is placed.   I still want her to go tot he appointment with the surgeon on February 20th so they can help with these recurrences.   I have put in an order for IR to drain and send cultures. OK to do either at [facility] or [facility] - whichever can get it done more urgently. Can you please help to coordinate scheduling this?   Also please have her see Tommy Medal (preferred since he knows her) next week about 3 days after drain is done (hopefully will be early next week...) Or I can see her the week after if her drain appt is later next week so we can adjust her antibiotics.   Thank you!

## 2021-07-14 ENCOUNTER — Telehealth: Payer: Self-pay | Admitting: Infectious Diseases

## 2021-07-14 ENCOUNTER — Encounter (HOSPITAL_COMMUNITY): Payer: Self-pay | Admitting: Radiology

## 2021-07-14 ENCOUNTER — Other Ambulatory Visit: Payer: Self-pay | Admitting: Infectious Diseases

## 2021-07-14 DIAGNOSIS — K611 Rectal abscess: Secondary | ICD-10-CM

## 2021-07-14 NOTE — Telephone Encounter (Signed)
D/W GYN team re: large uterine fibroids for which she is symptomatic with.   Will prioritize draining and treating her perirectal abscess and then plan to talk with her to have IR consult for uterine artery embolization if she would like to try to see if that helps her symptoms.  Will update her primary care provider as well.    Janene Madeira, MSN, NP-C [facility] for Infectious Disease [email or web address] Pager: [phone] Office: [phone] [place]: [phone]

## 2021-07-14 NOTE — Telephone Encounter (Signed)
-----   Message from Megan Salon, MD sent at 07/13/2021  4:13 PM EST ----- Regarding: RE: non-invasive options for fibroid uterus? [name], I reviewed the CT and prior ones as well.  I'm sure she is having symptoms from her enlarged uterus and fibroids.  However, the abscess needs to be treated before any surgery could be done.  It looks like it is all peri-rectal and not intraabdominal.  She would be a candidate for uterine artery embolization as well and this is less invasive and there would be significant decrease in uterine size with that procedure.  So, it's helpful for treating uterine fibroid especially in a person having bulk symptoms.  This procedure is done with interventional radiology.  IR would not do that either until the abscess was treated.  I hope that is helpful.  Feel free to call me at [phone] if you need.  Thank you.  Edwinna Areola  ----- Message ----- From: Clarnce Flock, MD Sent: 07/10/2021   3:26 PM EST To: [place] Callas, NP, Megan Salon, MD, # Subject: non-invasive options for fibroid uterus?       Hi Miachel Nardelli,  I'm very glad to hear that she's doing better. I worry that my Gyn colleagues will be very hesitant to perform any sort of hysterectomy in the presence of an intra-abdominal abscess, but she may be a candidate for other types of interventions.  Dr. Sabra Heck and Dr. Elgie Congo are Cc'd on this message. Would you two mind taking a look at her imaging and letting [name] know what options are available? I was thinking [employer] could be a possibility.  Thanks,  Clarnce Flock, MD/MPH Attending Family Medicine Physician, [place] for [facility], [place]  ----- Message ----- From: [place] Callas, NP Sent: 07/09/2021   8:55 AM EST To: Clarnce Flock, MD  Good morning, Dr. Dione Plover,   You had one point helped me with this nice lady last year re: her enlarged uterus and fibroids. I re-scanned her  pelvis as she was having some problems with rectal pain and drainage (apparently she has a recurrent perirectal abscess and I am trying to get her into surgery urgently to manage this).   She is having pelvic discomfort and urinary incontinence that is bothering her and I am sure it is from her very large and heavy uterus.  Her immune system has improved on treatment as has her general health over the last year. Her viral loads have consistently been undetectable as well .I was hoping your team can reach out to her to help arrange a follow up with you to re-consider possible hysterectomy.   If I can help in anyway please let me know,  Janene Madeira, NP [facility]

## 2021-07-14 NOTE — Telephone Encounter (Signed)
Spoke to [employer] at [employer] approved for patient have drain done at CT. Central scheduling will contact patient to schedule appointment.

## 2021-07-14 NOTE — Progress Notes (Signed)
Patient Name  [place], [place] Sex  Female DOB  20-Mar-1981 SSN  [id number] Address  [place] [address] Phone  [phone] [facility])  [phone] (Mobile)   FW: Perirectal abscess drain Received: Today Steward Ros, April Mellody Dance      Previous Messages   ----- Message -----  From: Aletta Edouard, MD  Sent: 07/14/2021   8:04 AM EST  To: Danielle Dess  Subject: RE: Perirectal abscess drain                   Delsa Sale had me review this. OK for CT guided drainage. We've done it before.   GY    ----- Message -----  From: Danielle Dess  Sent: 07/14/2021   7:38 AM EST  To: Ir Procedure Requests  Subject: Perirectal abscess drain                       Procedure: Perirectal abscess drain   Patient with deep perirectal abscess that is recurrent. Spoke with surgery and too deep for surgical approach and recommended percutaneous drainage and injection prior to removal to rule out fistula.   Dx: perirectal abscess   Ordering: Janene Madeira [facility] [id number])   Imaging: ct pelvis done 07/07/21   Please review.   Thanks,  Lia Foyer

## 2021-07-27 ENCOUNTER — Other Ambulatory Visit: Payer: Self-pay | Admitting: Student

## 2021-07-28 ENCOUNTER — Ambulatory Visit (HOSPITAL_COMMUNITY)
Admission: RE | Admit: 2021-07-28 | Discharge: 2021-07-28 | Disposition: A | Discharge status: Home / Self Care | Payer: Medicaid Other | Source: Ambulatory Visit | Attending: Infectious Diseases | Admitting: Infectious Diseases

## 2021-07-28 ENCOUNTER — Encounter (HOSPITAL_COMMUNITY): Payer: Self-pay

## 2021-07-28 ENCOUNTER — Other Ambulatory Visit: Payer: Self-pay

## 2021-07-28 DIAGNOSIS — K611 Rectal abscess: Secondary | ICD-10-CM | POA: Insufficient documentation

## 2021-07-28 LAB — CBC
HCT: 28.9 % — ABNORMAL LOW (ref 36.0–46.0)
Hemoglobin: 8.9 g/dL — ABNORMAL LOW (ref 12.0–15.0)
MCH: 24.4 pg — ABNORMAL LOW (ref 26.0–34.0)
MCHC: 30.8 g/dL (ref 30.0–36.0)
MCV: 79.2 fL — ABNORMAL LOW (ref 80.0–100.0)
Platelets: 143 10*3/uL — ABNORMAL LOW (ref 150–400)
RBC: 3.65 MIL/uL — ABNORMAL LOW (ref 3.87–5.11)
RDW: 15.7 % — ABNORMAL HIGH (ref 11.5–15.5)
WBC: 3.4 10*3/uL — ABNORMAL LOW (ref 4.0–10.5)
nRBC: 0 % (ref 0.0–0.2)

## 2021-07-28 LAB — PROTIME-INR
INR: 1 (ref 0.8–1.2)
Prothrombin Time: 12.7 seconds (ref 11.4–15.2)

## 2021-07-28 MED ORDER — SODIUM CHLORIDE 0.9 % IV SOLN: INTRAVENOUS | Status: DC

## 2021-07-28 MED ORDER — STERILE WATER FOR INJECTION IJ SOLN
200.0000 mg | Freq: Once | INTRAMUSCULAR | Status: DC
  Filled 2021-07-28: qty 200

## 2021-07-28 NOTE — H&P (Signed)
Chief Complaint: Recurrent perirectal abscess. Request is for perirectal abscess drain placement.  Referring Physician(s): Dixon,Stephanie N  Supervising Physician: Sandi Mariscal  Patient Status: [facility] - Out-pt  History of Present Illness: Candace Jones is a 41 y.o. female [place] speaking female outpatient. Known to IR. History of  History of HIV with toxoplasma encephalitis. AKI, anemia, hyperkalemia, perirectal abscesses.  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 CT abd pelvis from 1.25.23 reads there is interval re-accumulation of 4.3 x 2.8 cm loculated fluid collection in the right perirectal/perianal region suggesting recurrent abscess.Patient is not a candidate for surgical. Team is requesting a perirectal abscess drain placement.   Patient seen at bedside with [facility] appointed interpretor. Currently without any significant complaints. Patient alert and laying in bed, calm and comfortable. Denies any fevers, headache, chest pain, SOB, cough, abdominal pain, nausea, vomiting or bleeding. Return precautions and treatment recommendations and follow-up discussed with the patient  who is agreeable with the plan.    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    Past Surgical History:  Procedure Laterality Date   APPLICATION OF CRANIAL NAVIGATION Left 10/02/2020   Procedure: APPLICATION OF CRANIAL NAVIGATION;  Surgeon: Newman Pies, MD;  Location: [place];  Service: Neurosurgery;  Laterality: Left;   buttock surgery     CRANIOTOMY Left 10/02/2020   Procedure: LEFT CRANIOTOMY FOR TUMOR BIOPSY/ RESECTION with BrainLab;  Surgeon: Newman Pies, MD;  Location: [place];  Service: Neurosurgery;  Laterality: Left;   IR RADIOLOGIST EVAL & MGMT  10/17/2020   IR RADIOLOGIST EVAL & MGMT  12/02/2020     Allergies: Tenofovir  Medications: Prior to Admission medications   Medication Sig Start Date End Date Taking? Authorizing Provider  amoxicillin-clavulanate (AUGMENTIN) 875-125 MG tablet Take 1 tablet by mouth 2 (two) times daily for 28 days. 07/10/21 08/07/21 Yes Dixon, Melton Krebs, NP  dolutegravir-lamiVUDine (DOVATO) 50-300 MG tablet Take 1 tablet by mouth daily. 03/31/21  Yes [place] Callas, NP  levETIRAcetam (KEPPRA) 750 MG tablet Take 1 tablet (750 mg total) by mouth 2 (two) times daily. 05/11/21  Yes Elsie Stain, MD  sulfamethoxazole-trimethoprim (BACTRIM DS) 800-160 MG tablet TAKE 1 TABLET BY MOUTH TWICE DAILY 07/09/21  Yes [place] Callas, NP  Brimonidine Tartrate (LUMIFY) 0.025 % SOLN Apply to eye. Patient not taking: Reported on 06/30/2021    [provider]  Olopatadine HCl 0.2 % SOLN Apply to eye. Patient not taking: Reported on 06/30/2021    [provider]     Family History  Problem Relation Age of Onset   Breast cancer Neg Hx     Social History   Socioeconomic History   Marital status: Single    Spouse name: Not on file   Number of children: Not on file   Years of education: Not on file   Highest education level: Not on file  Occupational History   Not on file  Tobacco Use   Smoking status: Never   Smokeless tobacco: Never  Substance and Sexual Activity   Alcohol use: Never   Drug use: Never   Sexual activity: Yes    Birth control/protection: None  Other Topics Concern   Not on file  Social History Narrative   Lives at home with daughter.  Working:  not working since April 2022 since surgery.  Education:  HS in [place].  [employer].   Social Determinants of Health   Financial Resource Strain: Not on file  Food Insecurity: Not on file  Transportation Needs: Not on file  Physical Activity: Not on file  Stress: Not on file  Social Connections: Not on file    Review of Systems: A 12 point ROS discussed and  pertinent positives are indicated in the HPI above.  All other systems are negative.  Review of Systems  Constitutional:  Negative for fatigue and fever.  HENT:  Negative for congestion.   Respiratory:  Negative for cough and shortness of breath.   Gastrointestinal:  Negative for abdominal pain, diarrhea, nausea and vomiting.   Vital Signs: BP 120/79    Pulse 81    Temp 97.8 F (36.6 C) (Oral)    Resp 18    Wt 208 lb (94.3 kg)    SpO2 100%    BMI 34.61 kg/m   Physical Exam Vitals and nursing note reviewed.  Constitutional:      Appearance: She is well-developed.  HENT:     Head: Normocephalic and atraumatic.  Eyes:     Conjunctiva/sclera: Conjunctivae normal.  Cardiovascular:     Rate and Rhythm: Normal rate and regular rhythm.     Heart sounds: Normal heart sounds.  Pulmonary:     Effort: Pulmonary effort is normal.     Breath sounds: Normal breath sounds.  Musculoskeletal:        General: Normal range of motion.     Cervical back: Normal range of motion.  Skin:    General: Skin is warm.  Neurological:     Mental Status: She is alert and oriented to person, place, and time.    Imaging: CT PELVIS W CONTRAST  Result Date: 07/08/2021 CLINICAL DATA:  Perirectal abscess EXAM: CT PELVIS WITH CONTRAST TECHNIQUE: Multidetector CT imaging of the pelvis was performed using the standard protocol following the bolus administration of intravenous contrast. RADIATION DOSE REDUCTION: This exam was performed according to the departmental dose-optimization program which includes automated exposure control, adjustment of the mA and/or kV according to patient size and/or use of iterative reconstruction technique. CONTRAST:  13mL OMNIPAQUE IOHEXOL 300 MG/ML  SOLN COMPARISON:  12/02/2020 FINDINGS: There is no free fluid in the pelvis. There is no dilation of small-bowel loops. Distal aorta and its major branches are patent without significant stenosis. There is interval re-accumulation of  loculated fluid in the right perirectal/perianal region inseparable from the right margin of rectum and anus measuring 4.3 x 2.8 cm. There is interval removal of percutaneous drainage catheter in the right perirectal region since the previous study. Uterus is enlarged measuring proximally 17.5 x 9.9 by 13.9 cm in length, AP and transverse diameters. Upper portion of enlarged uterus is noted in the lower abdominal cavity. There is inhomogeneous attenuation in myometrium with multiple fibroids largest in the body measuring a proximally 9.5 x 9.1 cm. Ovaries are not distinctly visualized. Other:  None Musculoskeletal: Unremarkable. IMPRESSION: There is interval re-accumulation of 4.3 x 2.8 cm loculated fluid collection in the right perirectal/perianal region suggesting recurrent abscess. Markedly enlarged uterus with multiple fibroids of varying sizes. Electronically Signed   By: Elmer Picker M.D.   On: 07/08/2021 19:35    Labs:  CBC: Recent Labs    11/11/20 0032 11/13/20 0101 11/14/20 0508 01/07/21 1218  WBC 3.6* 4.0 3.3* 3.2*  HGB 8.1* 8.1* 8.0* 10.5*  HCT 25.7* 25.9* 25.2* 33.1*  PLT 211 212 203 173  COAGS: Recent Labs    09/29/20 1814 11/07/20 0125 07/28/21 0826  INR 1.1 1.1 1.0  APTT 23*  --   --     BMP: Recent Labs    11/11/20 0032 11/13/20 0101 11/14/20 0508 11/15/20 1016 11/20/20 1108 01/06/21 1001 01/07/21 1218 03/31/21 1214 06/30/21 1050  NA 135 133* 136 135   < > 135 138 135 137  K 4.1 4.2 3.5 3.4*   < > 4.1 4.2 4.0 4.2  CL 105 103 103 103   < > 105 104 104 107  CO2 23 24 25 25    < > 24 21 27 27   GLUCOSE 91 98 90 83   < > 81 81 76 76  BUN 11 17 16 16    < > 19 16 11 12   CALCIUM 8.3* 8.5* 8.6* 8.9   < > 9.1 8.7 8.8 8.9  CREATININE 1.73* 2.01* 1.72* 1.59*   < > 0.81 0.69 0.64 0.70  GFRNONAA 38* 32* 38* 42*  --   --   --   --   --    < > = values in this interval not displayed.    LIVER FUNCTION TESTS: Recent Labs    11/05/20 1725 11/13/20 0101  11/14/20 0508 01/07/21 1218 06/30/21 1050  BILITOT 0.2* 0.6 0.6 0.3 0.3  AST 50* 38 33 22 18  ALT 54* 47* 41 18 14  ALKPHOS 64 59 54 79  --   PROT 7.5 7.1 7.0 7.9 7.7  ALBUMIN 3.4* 3.1* 3.1* 4.0  --      Assessment and Plan:  41 y.o. [place] speaking female outpatient. Known to IR. History of  History of HIV with toxoplasma encephalitis. AKI, anemia, hyperkalemia, perirectal abscesses.  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 CT abd pelvis from 1.25.23 reads there is interval re-accumulation of 4.3 x 2.8 cm loculated fluid collection in the right perirectal/perianal region suggesting recurrent abscess.Patient is not a candidate for surgical. Team is requesting a perirectal abscess drain placement.   IR previously placed a drain to the perirectal abscess on 4.26.22 and 5.26.22. Patient is on Augmentin and Bactrim. All other labs and medications are within acceptable parameters. No pertinent allergies. Patient has been NPO since midnight.  Risks and benefits discussed with the patient including bleeding, infection, damage to adjacent structures, bowel perforation/fistula connection, and sepsis.  All of the patient's questions were answered, patient is agreeable to proceed. Consent signed and in chart.   Thank you for this interesting consult.  I greatly enjoyed meeting Freida Nebel and look forward to participating in their care.  A copy of this report was sent to the requesting provider on this date.  Electronically Signed: Jacqualine Mau, NP 07/28/2021, 9:44 AM   I spent a total of  30 Minutes   in face to face in clinical consultation, greater than 50% of which was counseling/coordinating care for perirectal abscess drain placement

## 2021-08-05 ENCOUNTER — Other Ambulatory Visit: Payer: Self-pay | Admitting: Infectious Diseases

## 2021-08-05 DIAGNOSIS — K611 Rectal abscess: Secondary | ICD-10-CM

## 2021-08-06 NOTE — Telephone Encounter (Signed)
Okay to refill? Scheduled to follow up on 3/1

## 2021-08-12 ENCOUNTER — Encounter: Payer: Self-pay | Admitting: Infectious Disease

## 2021-08-12 ENCOUNTER — Other Ambulatory Visit (HOSPITAL_COMMUNITY): Payer: Self-pay

## 2021-08-12 ENCOUNTER — Ambulatory Visit (INDEPENDENT_AMBULATORY_CARE_PROVIDER_SITE_OTHER): Payer: Medicaid Other | Admitting: Infectious Disease

## 2021-08-12 ENCOUNTER — Other Ambulatory Visit: Payer: Self-pay

## 2021-08-12 VITALS — BP 125/78 | HR 72 | Temp 98.7°F | Wt 211.0 lb

## 2021-08-12 DIAGNOSIS — D259 Leiomyoma of uterus, unspecified: Secondary | ICD-10-CM | POA: Diagnosis not present

## 2021-08-12 DIAGNOSIS — N139 Obstructive and reflux uropathy, unspecified: Secondary | ICD-10-CM

## 2021-08-12 DIAGNOSIS — B582 Toxoplasma meningoencephalitis: Secondary | ICD-10-CM

## 2021-08-12 DIAGNOSIS — B20 Human immunodeficiency virus [HIV] disease: Secondary | ICD-10-CM | POA: Diagnosis not present

## 2021-08-12 DIAGNOSIS — M7918 Myalgia, other site: Secondary | ICD-10-CM

## 2021-08-12 DIAGNOSIS — K611 Rectal abscess: Secondary | ICD-10-CM | POA: Diagnosis present

## 2021-08-12 DIAGNOSIS — N739 Female pelvic inflammatory disease, unspecified: Secondary | ICD-10-CM

## 2021-08-12 MED ORDER — LEVETIRACETAM 750 MG PO TABS
750.0000 mg | ORAL_TABLET | Freq: Two times a day (BID) | ORAL | 11 refills | Status: DC
  Filled 2021-08-12: qty 60, 30d supply, fill #0

## 2021-08-12 MED ORDER — AMOXICILLIN-POT CLAVULANATE 875-125 MG PO TABS
1.0000 | ORAL_TABLET | Freq: Two times a day (BID) | ORAL | 2 refills | Status: AC
  Filled 2021-08-12: qty 28, 14d supply, fill #0
  Filled 2021-09-04: qty 28, 14d supply, fill #1

## 2021-08-12 MED ORDER — DOVATO 50-300 MG PO TABS
1.0000 | ORAL_TABLET | Freq: Every day | ORAL | 11 refills | Status: DC
  Filled 2021-08-12: qty 30, 30d supply, fill #0

## 2021-08-12 MED ORDER — SULFAMETHOXAZOLE-TRIMETHOPRIM 800-160 MG PO TABS
1.0000 | ORAL_TABLET | Freq: Two times a day (BID) | ORAL | 11 refills | Status: DC
  Filled 2021-08-12 – 2021-09-04 (×2): qty 60, 30d supply, fill #0

## 2021-08-12 NOTE — Progress Notes (Signed)
Chief complaint: Left-sided buttock pain which is improved on Augmentin   Subjective:    Patient ID: Candace Jones, female    DOB: Mar 09, 1981, 41 y.o.   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  She has also had a heavy bleeding likely due to uterine fibroids.  She was seen by Janene Madeira who follows her closely in clinic who referred her to interventional radiology.  IR had offered drainage of the site since it was too deep for surgery to intervene upon but apparently there seem to been potentially some mis-communication and patient seemed to think that IR drainage was not needed. She has been on augmentin. She has been concerned re heavy uterine bleeding. IR can offer uterine artery embolization to potentially help with this but they are not going to do that until the abscess has resolved.  In talking to [facility] today he says that is not the reason why she refused the IR guided biopsy and drainage.  The reason is that she has the entirety of her symptoms on the left side namely her left buttocks with radiation down to her left knee at the beginning.  She therefore did not want placement of a drain on the right side though the abscess is indeed on the right side and I double checked by looking at imaging today from the CT scan.  Regardless she feels like she is improving the pain no longer radiates down her leg and she is able to squat without difficulty.  She was asked to pay $135 for Augmentin to [facility] on [address] is despite having Medicaid she did not fill this prescription but had Augmentin left over from prior bottle that she was given while an inpatient at [facility].  At present she does not want to pursue IR  guided placement of drain but would like to have further trial of antibiotics.  Note the entirety of this interview was conducted with an in person [place] interpreter.       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    Past Surgical History:  Procedure Laterality Date   APPLICATION OF CRANIAL NAVIGATION Left 10/02/2020   Procedure: APPLICATION OF CRANIAL NAVIGATION;  Surgeon: Newman Pies, MD;  Location: [place];  Service: Neurosurgery;  Laterality: Left;   buttock surgery     CRANIOTOMY Left 10/02/2020   Procedure: LEFT CRANIOTOMY FOR TUMOR BIOPSY/ RESECTION with BrainLab;  Surgeon: Newman Pies, MD;  Location: [place];  Service: Neurosurgery;  Laterality: Left;   IR RADIOLOGIST EVAL & MGMT  10/17/2020   IR RADIOLOGIST EVAL & MGMT  12/02/2020    Family History  Problem Relation Age of Onset   Breast cancer Neg Hx       Social History   Socioeconomic History   Marital status: Single    Spouse name: Not on file   Number of children: Not on file   Years of education: Not on file   Highest education level: Not on file  Occupational History   Not on file  Tobacco Use   Smoking status: Never  Smokeless tobacco: Never  Substance and Sexual Activity   Alcohol use: Never   Drug use: Never   Sexual activity: Yes    Birth control/protection: None  Other Topics Concern   Not on file  Social History Narrative   Lives at home with daughter.  Working:  not working since April 2022 since surgery.  Education:  HS in [place].  [employer].   Social Determinants of Health   Financial Resource Strain: Not on file  Food Insecurity: Not on file  Transportation Needs: Not on file  Physical Activity: Not on file  Stress: Not on file  Social Connections: Not on file    Allergies  Allergen Reactions    Tenofovir Other (See Comments)    Patient started on Icard - acute renal failure shortly after with creatinine 5.2. Unclear if related to TAF but would approach with caution if re-trialed.      Current Outpatient Medications:    amoxicillin-clavulanate (AUGMENTIN) 875-125 MG tablet, Take 1 tablet by mouth 2 (two) times daily for 14 days., Disp: 28 tablet, Rfl: 0   Brimonidine Tartrate (LUMIFY) 0.025 % SOLN, Apply to eye. (Patient not taking: Reported on 06/30/2021), Disp: , Rfl:    dolutegravir-lamiVUDine (DOVATO) 50-300 MG tablet, Take 1 tablet by mouth daily., Disp: 30 tablet, Rfl: 3   levETIRAcetam (KEPPRA) 750 MG tablet, Take 1 tablet (750 mg total) by mouth 2 (two) times daily., Disp: 60 tablet, Rfl: 5   Olopatadine HCl 0.2 % SOLN, Apply to eye. (Patient not taking: Reported on 06/30/2021), Disp: , Rfl:    sulfamethoxazole-trimethoprim (BACTRIM DS) 800-160 MG tablet, TAKE 1 TABLET BY MOUTH TWICE DAILY, Disp: 60 tablet, Rfl: 5   Review of Systems  Constitutional:  Negative for activity change, appetite change, chills, diaphoresis, fatigue, fever and unexpected weight change.  HENT:  Negative for congestion, rhinorrhea, sinus pressure, sneezing, sore throat and trouble swallowing.   Eyes:  Negative for photophobia and visual disturbance.  Respiratory:  Negative for cough, chest tightness, shortness of breath, wheezing and stridor.   Cardiovascular:  Negative for chest pain, palpitations and leg swelling.  Gastrointestinal:  Negative for abdominal distention, abdominal pain, anal bleeding, blood in stool, constipation, diarrhea, nausea and vomiting.  Genitourinary:  Negative for difficulty urinating, dysuria, flank pain and hematuria.  Musculoskeletal:  Negative for arthralgias, back pain, gait problem, joint swelling and myalgias.  Skin:  Negative for color change, pallor, rash and wound.  Neurological:  Negative for dizziness, tremors, weakness, light-headedness and headaches.   Hematological:  Negative for adenopathy. Does not bruise/bleed easily.  Psychiatric/Behavioral:  Negative for agitation, behavioral problems, confusion, decreased concentration, dysphoric mood, sleep disturbance and suicidal ideas.       Objective:   Physical Exam Nursing note reviewed. Exam conducted with a chaperone present.  Constitutional:      General: She is not in acute distress.    Appearance: Normal appearance. She is well-developed. She is not ill-appearing or diaphoretic.  HENT:     Head: Normocephalic and atraumatic.     Right Ear: Hearing and external ear normal.     Left Ear: Hearing and external ear normal.     Nose: No nasal deformity or rhinorrhea.  Eyes:     General: No scleral icterus.    Conjunctiva/sclera: Conjunctivae normal.     Right eye: Right conjunctiva is not injected.     Left eye: Left conjunctiva is not injected.     Pupils: Pupils are equal, round, and reactive to  light.  Neck:     Vascular: No JVD.  Cardiovascular:     Rate and Rhythm: Normal rate and regular rhythm.     Heart sounds: S1 normal and S2 normal.  Pulmonary:     Effort: No respiratory distress.     Breath sounds: No wheezing.  Abdominal:     General: Bowel sounds are normal. There is no distension.     Palpations: Abdomen is soft.     Tenderness: There is no abdominal tenderness.  Musculoskeletal:        General: Normal range of motion.     Right shoulder: Normal.     Left shoulder: Normal.     Cervical back: Normal range of motion and neck supple.     Right hip: Normal.     Left hip: Normal.     Right knee: Normal.     Left knee: Normal.  Lymphadenopathy:     Head:     Right side of head: No submandibular, preauricular or posterior auricular adenopathy.     Left side of head: No submandibular, preauricular or posterior auricular adenopathy.     Cervical: No cervical adenopathy.     Right cervical: No superficial or deep cervical adenopathy.    Left cervical: No  superficial or deep cervical adenopathy.  Skin:    General: Skin is warm and dry.     Coloration: Skin is not pale.     Findings: No abrasion, bruising, ecchymosis, erythema, lesion or rash.     Nails: There is no clubbing.  Neurological:     Mental Status: She is alert and oriented to person, place, and time.     Sensory: No sensory deficit.     Coordination: Coordination normal.     Gait: Gait normal.  Psychiatric:        Attention and Perception: She is attentive.        Mood and Affect: Mood normal.        Speech: Speech normal.        Behavior: Behavior normal. Behavior is cooperative.        Thought Content: Thought content normal.        Judgment: Judgment normal.     She has some tenderness of right buttocks on exam     Assessment & Plan:   Perirectal abscess: The abscess is on the right side.  Not sure why she is having symptoms in the left other than the fact that visceral symptoms do not localize her [place] Stands always.  At the this point time though she clearly wants to continue oral therapy.  I have given her an additional month of Augmentin and schedule her to see me in 3 weeks time.  I asked her to please let me know if there is any worsening of symptoms and if there are is then she will clearly need interventional IR guided drainage.  I told her that she still very well would likely need such drainage but I was willing to give her further trial of protracted Augmentin.  Heavy menstrual bleeding: She claims now that she is not having heavy menstrual bleeding anymore.  HIV AIDS:  Viral load is undetectable and CD4 count is rising though not yet to 200.  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  Seizure disorder continue Keppra twice daily.  I spent 44 minutes with the patient including than 50% of the time in face to face counseling of the patient regarding  the nature of perirectal abscesses abscesses in  general her HIV and her current state of health her history of toxoplasmosis and seizure disorder, personally reviewing CT abdomen pelvis showing right-sided perirectal abscess along with review of medical records in preparation for the visit and during the visit and in coordination of her care.

## 2021-08-15 ENCOUNTER — Other Ambulatory Visit (HOSPITAL_COMMUNITY): Payer: Self-pay

## 2021-08-17 ENCOUNTER — Other Ambulatory Visit (HOSPITAL_COMMUNITY): Payer: Self-pay

## 2021-08-18 ENCOUNTER — Other Ambulatory Visit (HOSPITAL_COMMUNITY): Payer: Self-pay

## 2021-09-01 ENCOUNTER — Other Ambulatory Visit (HOSPITAL_COMMUNITY): Payer: Self-pay

## 2021-09-02 ENCOUNTER — Other Ambulatory Visit (HOSPITAL_COMMUNITY)
Admission: RE | Admit: 2021-09-02 | Discharge: 2021-09-02 | Disposition: A | Discharge status: Home / Self Care | Payer: Medicaid Other | Source: Ambulatory Visit | Attending: Infectious Disease | Admitting: Infectious Disease

## 2021-09-02 ENCOUNTER — Encounter: Payer: Self-pay | Admitting: Infectious Disease

## 2021-09-02 ENCOUNTER — Ambulatory Visit (INDEPENDENT_AMBULATORY_CARE_PROVIDER_SITE_OTHER): Payer: Medicaid Other | Admitting: Infectious Disease

## 2021-09-02 ENCOUNTER — Other Ambulatory Visit: Payer: Self-pay

## 2021-09-02 VITALS — BP 113/74 | HR 73 | Wt 212.0 lb

## 2021-09-02 DIAGNOSIS — Z23 Encounter for immunization: Secondary | ICD-10-CM | POA: Diagnosis not present

## 2021-09-02 DIAGNOSIS — Z113 Encounter for screening for infections with a predominantly sexual mode of transmission: Secondary | ICD-10-CM | POA: Diagnosis present

## 2021-09-02 DIAGNOSIS — B582 Toxoplasma meningoencephalitis: Secondary | ICD-10-CM | POA: Insufficient documentation

## 2021-09-02 DIAGNOSIS — R32 Unspecified urinary incontinence: Secondary | ICD-10-CM | POA: Diagnosis not present

## 2021-09-02 DIAGNOSIS — B20 Human immunodeficiency virus [HIV] disease: Secondary | ICD-10-CM

## 2021-09-02 DIAGNOSIS — K611 Rectal abscess: Secondary | ICD-10-CM | POA: Diagnosis not present

## 2021-09-02 DIAGNOSIS — R569 Unspecified convulsions: Secondary | ICD-10-CM

## 2021-09-02 DIAGNOSIS — N139 Obstructive and reflux uropathy, unspecified: Secondary | ICD-10-CM

## 2021-09-02 HISTORY — DX: Unspecified urinary incontinence: R32

## 2021-09-02 HISTORY — DX: Rectal abscess: K61.1

## 2021-09-02 NOTE — Progress Notes (Signed)
? ?Chief complaint: Follow-up for perirectal abscess HIV disease CNS toxoplasmosis ? ? ?Subjective:  ? ? Patient ID: Candace Jones, female    DOB: 02/08/1981, 41 y.o.   MRN: [id number] ? ?HPI ? 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 ? ?She has also had a heavy bleeding likely due to uterine fibroids. ? ?She was seen by Candace Jones who follows her closely in clinic who referred her to interventional radiology.  IR had offered drainage of the site since it was too deep for surgery to intervene upon but apparently there seem to been potentially some mis-communication and patient seemed to think that IR drainage was not needed. She has been on augmentin. My understanding at the time  was concerned re heavy uterine bleeding. IR ccould of uterine artery embolization to potentially help with this but they were not going to do that until the abscess has resolved. ? ?In talking to [facility] at last visit  he says that is not the reason why she refused the IR guided biopsy and drainage. ? ?The reason is that she had the entirety of her symptoms on the left side namely her left buttocks with radiation down to her left knee at the beginning.  She therefore did not want placement of a drain on the right side though the abscess is indeed on the right side and I double checked by looking at imaging from her CT scan at last visit ? ?Regardless she felt like she is improving the pain no longer radiates down her leg and she is able to squat without difficulty. ? ?She was asked to pay $135 for Augmentin to [facility] on [address] is despite having Medicaid she did not fill this prescription but had Augmentin left over from prior bottle that she was given while an inpatient at [facility]. ? ?At present she did not want to pursue IR guided placement of drain but would like to have further trial of antibiotics. ? ?We opted to give her more protracted Augmentin I gave her a months worth of antibiotics and I am now seeing her 3 weeks later. ? ?Her symptoms have completely resolved and she has no pain whatsoever. ? ?We discussed the idea of finishing antibiotics and seeing if the pain returns and if it does then getting a CT scan versus getting a CT scan scan in the interim.  The latter approach would involve more radiation though is possible that interventional radiologist may want another CT scan before considering embolization.  In talking to her today he states that she has not had heavy menstrual bleeding but rather what she is experiencing is leakage of urine and some incontinence at times for which she wears pads. ? ? ?Note the entirety of this interview was conducted with an in person [place] interpreter. ? ? ? ? ? ? ?Past Medical History:  ?Diagnosis Date  ? AIDS (acquired immune deficiency syndrome) ([place])   ? Hydronephrosis   ? Hyperkalemia 11/05/2020  ? Perirectal abscess   ? 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 ? ?Past Surgical History:  ?Procedure Laterality Date  ? APPLICATION OF CRANIAL NAVIGATION Left 10/02/2020  ? Procedure: APPLICATION OF CRANIAL NAVIGATION;  Surgeon: Candace Pies, MD;  Location: [facility];  Service: Neurosurgery;  Laterality: Left;  ?  buttock surgery    ? CRANIOTOMY Left 10/02/2020  ? Procedure: LEFT CRANIOTOMY FOR TUMOR BIOPSY/ RESECTION with BrainLab;  Surgeon: Candace Pies, MD;  Location: [place];  Service: Neurosurgery;  Laterality: Left;  ? IR RADIOLOGIST EVAL & MGMT  10/17/2020  ? IR RADIOLOGIST EVAL & MGMT  12/02/2020  ? ? ?Family History  ?Problem Relation Age of Onset  ? Breast cancer Neg Hx   ? ? ?  ?Social History  ? ?Socioeconomic History  ? Marital status: Single  ?  Spouse name: Not on file  ? Number of children: Not on file  ? Years of  education: Not on file  ? Highest education level: Not on file  ?Occupational History  ? Not on file  ?Tobacco Use  ? Smoking status: Never  ? Smokeless tobacco: Never  ?Substance and Sexual Activity  ? Alcohol use: Never  ? Drug use: Never  ? Sexual activity: Yes  ?  Birth control/protection: None  ?Other Topics Concern  ? Not on file  ?Social History Narrative  ? Lives at home with daughter.  Working:  not working since April 2022 since surgery.  Education:  HS in [place].  [employer].  ? ?Social Determinants of Health  ? ?Financial Resource Strain: Not on file  ?Food Insecurity: Not on file  ?Transportation Needs: Not on file  ?Physical Activity: Not on file  ?Stress: Not on file  ?Social Connections: Not on file  ? ? ?Allergies  ?Allergen Reactions  ? Tenofovir Other (See Comments)  ?  Patient started on Biktarvy - acute renal failure shortly after with creatinine 5.2. Unclear if related to TAF but would approach with caution if re-trialed.   ? ? ? ?Current Outpatient Medications:  ?  Brimonidine Tartrate (LUMIFY) 0.025 % SOLN, Apply to eye., Disp: , Rfl:  ?  dolutegravir-lamiVUDine (DOVATO) 50-300 MG tablet, Take 1 tablet by mouth daily., Disp: 30 tablet, Rfl: 11 ?  levETIRAcetam (KEPPRA) 750 MG tablet, Take 1 tablet (750 mg total) by mouth 2 (two) times daily., Disp: 60 tablet, Rfl: 11 ?  Olopatadine HCl 0.2 % SOLN, Apply to eye., Disp: , Rfl:  ?  sulfamethoxazole-trimethoprim (BACTRIM DS) 800-160 MG tablet, Take 1 tablet by mouth 2 (two) times daily., Disp: 60 tablet, Rfl: 11 ? ? ?Review of Systems  ?Constitutional:  Negative for activity change, appetite change, chills, diaphoresis, fatigue, fever and unexpected weight change.  ?HENT:  Negative for congestion, rhinorrhea, sinus pressure, sneezing, sore throat and trouble swallowing.   ?Eyes:  Negative for photophobia and visual disturbance.  ?Respiratory:  Negative for cough, chest tightness, shortness of breath, wheezing and stridor.    ?Cardiovascular:  Negative for chest pain, palpitations and leg swelling.  ?Gastrointestinal:  Negative for abdominal distention, abdominal pain, anal bleeding, blood in stool, constipation, diarrhea, nausea and vomiting.  ?Genitourinary:  Negative for difficulty urinating, dysuria, flank pain and hematuria.  ?Musculoskeletal:  Negative for arthralgias, back pain, gait problem, joint swelling and myalgias.  ?Skin:  Negative for color change, pallor, rash and wound.  ?Neurological:  Negative for dizziness, tremors, weakness and light-headedness.  ?Hematological:  Negative for adenopathy. Does not bruise/bleed easily.  ?Psychiatric/Behavioral:  Negative for agitation, behavioral problems, confusion, decreased concentration, dysphoric mood and sleep disturbance.   ? ?   ?Objective:  ? Physical Exam ?Constitutional:   ?   General: She is not in acute distress. ?   Appearance: Normal appearance. She is well-developed. She is not ill-appearing or diaphoretic.  ?HENT:  ?  Head: Normocephalic and atraumatic.  ?   Right Ear: Hearing and external ear normal.  ?   Left Ear: Hearing and external ear normal.  ?   Nose: No nasal deformity or rhinorrhea.  ?Eyes:  ?   General: No scleral icterus. ?   Conjunctiva/sclera: Conjunctivae normal.  ?   Right eye: Right conjunctiva is not injected.  ?   Left eye: Left conjunctiva is not injected.  ?   Pupils: Pupils are equal, round, and reactive to light.  ?Neck:  ?   Vascular: No JVD.  ?Cardiovascular:  ?   Rate and Rhythm: Normal rate and regular rhythm.  ?   Heart sounds: S1 normal and S2 normal. No murmur heard. ?Pulmonary:  ?   Effort: Pulmonary effort is normal. No respiratory distress.  ?   Breath sounds: No wheezing.  ?Abdominal:  ?   General: There is no distension.  ?   Palpations: Abdomen is soft.  ?Musculoskeletal:     ?   General: Normal range of motion.  ?   Right shoulder: Normal.  ?   Left shoulder: Normal.  ?   Cervical back: Normal range of motion and neck supple.  ?    Right hip: Normal.  ?   Left hip: Normal.  ?   Right knee: Normal.  ?   Left knee: Normal.  ?Lymphadenopathy:  ?   Head:  ?   Right side of head: No submandibular, preauricular or posterior auricular adenopathy.  ?   Left si

## 2021-09-03 LAB — CYTOLOGY, (ORAL, ANAL, URETHRAL) ANCILLARY ONLY
Chlamydia: NEGATIVE
Comment: NEGATIVE
Comment: NORMAL
Neisseria Gonorrhea: NEGATIVE

## 2021-09-03 LAB — T-HELPER CELLS (CD4) COUNT (NOT AT ARMC)
CD4 % Helper T Cell: 12 % — ABNORMAL LOW (ref 33–65)
CD4 T Cell Abs: 165 /uL — ABNORMAL LOW (ref 400–1790)

## 2021-09-04 ENCOUNTER — Other Ambulatory Visit (HOSPITAL_COMMUNITY): Payer: Self-pay

## 2021-09-05 LAB — CBC WITH DIFFERENTIAL/PLATELET
Absolute Monocytes: 366 cells/uL (ref 200–950)
Basophils Absolute: 9 cells/uL (ref 0–200)
Basophils Relative: 0.3 %
Eosinophils Absolute: 81 cells/uL (ref 15–500)
Eosinophils Relative: 2.6 %
HCT: 27.9 % — ABNORMAL LOW (ref 35.0–45.0)
Hemoglobin: 8.2 g/dL — ABNORMAL LOW (ref 11.7–15.5)
Lymphs Abs: 1504 cells/uL (ref 850–3900)
MCH: 22.7 pg — ABNORMAL LOW (ref 27.0–33.0)
MCHC: 29.4 g/dL — ABNORMAL LOW (ref 32.0–36.0)
MCV: 77.3 fL — ABNORMAL LOW (ref 80.0–100.0)
Monocytes Relative: 11.8 %
Neutro Abs: 1141 cells/uL — ABNORMAL LOW (ref 1500–7800)
Neutrophils Relative %: 36.8 %
Platelets: 226 10*3/uL (ref 140–400)
RBC: 3.61 10*6/uL — ABNORMAL LOW (ref 3.80–5.10)
RDW: 15 % (ref 11.0–15.0)
Total Lymphocyte: 48.5 %
WBC: 3.1 10*3/uL — ABNORMAL LOW (ref 3.8–10.8)

## 2021-09-05 LAB — COMPLETE METABOLIC PANEL WITH GFR
AG Ratio: 1.1 (calc) (ref 1.0–2.5)
ALT: 12 U/L (ref 6–29)
AST: 22 U/L (ref 10–30)
Albumin: 4 g/dL (ref 3.6–5.1)
Alkaline phosphatase (APISO): 50 U/L (ref 31–125)
BUN: 13 mg/dL (ref 7–25)
CO2: 24 mmol/L (ref 20–32)
Calcium: 8.4 mg/dL — ABNORMAL LOW (ref 8.6–10.2)
Chloride: 106 mmol/L (ref 98–110)
Creat: 0.7 mg/dL (ref 0.50–0.99)
Globulin: 3.7 g/dL (calc) (ref 1.9–3.7)
Glucose, Bld: 86 mg/dL (ref 65–99)
Potassium: 3.9 mmol/L (ref 3.5–5.3)
Sodium: 135 mmol/L (ref 135–146)
Total Bilirubin: 0.5 mg/dL (ref 0.2–1.2)
Total Protein: 7.7 g/dL (ref 6.1–8.1)
eGFR: 111 mL/min/{1.73_m2} (ref >=60)

## 2021-09-05 LAB — HIV-1 RNA QUANT-NO REFLEX-BLD
HIV 1 RNA Quant: NOT DETECTED Copies/mL
HIV-1 RNA Quant, Log: NOT DETECTED Log cps/mL

## 2021-09-05 LAB — RPR: RPR Ser Ql: NONREACTIVE

## 2021-09-08 ENCOUNTER — Ambulatory Visit: Payer: Self-pay | Admitting: Critical Care Medicine

## 2021-09-20 ENCOUNTER — Other Ambulatory Visit: Payer: Self-pay | Admitting: Infectious Diseases

## 2021-09-20 DIAGNOSIS — K611 Rectal abscess: Secondary | ICD-10-CM

## 2021-09-22 ENCOUNTER — Ambulatory Visit (INDEPENDENT_AMBULATORY_CARE_PROVIDER_SITE_OTHER): Payer: Medicaid Other | Admitting: Infectious Diseases

## 2021-09-22 ENCOUNTER — Other Ambulatory Visit: Payer: Self-pay

## 2021-09-22 ENCOUNTER — Encounter: Payer: Self-pay | Admitting: Infectious Diseases

## 2021-09-22 VITALS — BP 120/80 | HR 75 | Temp 98.3°F | Wt 209.0 lb

## 2021-09-22 DIAGNOSIS — L281 Prurigo nodularis: Secondary | ICD-10-CM | POA: Insufficient documentation

## 2021-09-22 DIAGNOSIS — R21 Rash and other nonspecific skin eruption: Secondary | ICD-10-CM | POA: Insufficient documentation

## 2021-09-22 DIAGNOSIS — R32 Unspecified urinary incontinence: Secondary | ICD-10-CM | POA: Diagnosis not present

## 2021-09-22 DIAGNOSIS — B20 Human immunodeficiency virus [HIV] disease: Secondary | ICD-10-CM | POA: Diagnosis present

## 2021-09-22 DIAGNOSIS — K611 Rectal abscess: Secondary | ICD-10-CM

## 2021-09-22 DIAGNOSIS — L282 Other prurigo: Secondary | ICD-10-CM | POA: Diagnosis not present

## 2021-09-22 DIAGNOSIS — B582 Toxoplasma meningoencephalitis: Secondary | ICD-10-CM | POA: Diagnosis not present

## 2021-09-22 MED ORDER — DOVATO 50-300 MG PO TABS: 1.0000 | ORAL_TABLET | Freq: Every day | ORAL | 11 refills | Status: DC

## 2021-09-22 MED ORDER — TRIAMCINOLONE ACETONIDE 0.5 % EX OINT: 1.0000 "application " | TOPICAL_OINTMENT | Freq: Two times a day (BID) | CUTANEOUS | 2 refills | Status: DC

## 2021-09-22 MED ORDER — SULFAMETHOXAZOLE-TRIMETHOPRIM 800-160 MG PO TABS: 1.0000 | ORAL_TABLET | Freq: Two times a day (BID) | ORAL | 11 refills | Status: DC

## 2021-09-22 MED ORDER — LEVETIRACETAM 750 MG PO TABS: 750.0000 mg | ORAL_TABLET | Freq: Two times a day (BID) | ORAL | 11 refills | Status: DC

## 2021-09-22 NOTE — Patient Instructions (Addendum)
Use the allergy medication in the green box one tablet once a day to help with the itching.  ? ?Use the triamcinolone cream 2-3 times a day on the itching rashes  ? ?Please continue your Dovato everyday.  ? ?Continue the bactrim twice a day as well.  ? ?Continue the keppra twice a day  ? ? ? ? ?Utilisez le m?dicament contre les allergies dans la bo?te verte un comprim? une fois par jour pour Candace Jones. ? ?Utilisez la cr?me de triamcinolone 2 ? 3 fois par Candace Jones d?mangeaisons ? ?Veuillez continuer votre Dovato tous les jours. ? ?Continuez ?galement le bactrim deux fois par Candace Jones. ? ?Continuer le keppra deux fois par jour ?

## 2021-09-22 NOTE — Assessment & Plan Note (Signed)
She completed nearly 8 weeks of augmentin and her symptoms have resolved completely without draining her abscess. She is not interested in repeating her scan and would like to see how she does off antibiotics. Her urinary / menstrual symptoms seem improved also.  ?Will follow her clinically off antibiotics.  ?

## 2021-09-22 NOTE — Assessment & Plan Note (Signed)
Seems quiescent at this time - I am not sure how much the perirectal abscess really contributed to her symptoms but she does not want to consider IR uterine artery embolization at this time for her large fibroids.  ?Will follow.  ?

## 2021-09-22 NOTE — Assessment & Plan Note (Addendum)
Unclear the etiology of her rash. May be pityriasis but I am not familiar with this presenting on face. Will use antihistamines and topical steroids to see if they help with symptoms. Ultimately may need derm referral to evaluate.  ?No bug or animal exposures to her knowledge. Pretty wide spread on exposed and skin under clothing.  ?

## 2021-09-22 NOTE — Assessment & Plan Note (Addendum)
Doing well with slow reconstitution of immune system. Last CD4 165. Continue Bactrim BID proph and dovato. Will send Rx back to preferred pharmacy that is closer for her to pick up.  ?VL and CD4 reviewed from last appt with Dr. Tommy Medal 3w prior.  ?RTC in 57mfor ongoing care.  ?

## 2021-09-22 NOTE — Progress Notes (Signed)
? ?Name: Candace Jones  ?DOB: 04-Oct-1980 ?MRN: [id number] ?PCP: Elsie Stain, MD  ? ? ?Brief Narrative:  ?Candace Jones is a 41 y.o. female with HIV, AIDS+ at Dx 09/2020 with CD4 < 35 ?Risk: sexual, endemic area ?History of OIs: CNS Toxo (Tx started 4/22) ?Intake Labs 09-2020: ?Hep B sAg (-), sAb (+), cAb (-); Hep A (+), Hep C (-) ?Quantiferon () ?HLA B*5701 () ?G6PD: (normal) ?Toxo IgG: (+)  ? ? ?Previous Regimens: ?Biktarvy 09/2020 (AKI with Scr > 5) ?Dovato 11/2020  ? ?Genotypes: ?09/2020 - hospitalized and not performed.  ? ? ? ? ?Subjective:  ? ?Chief Complaint  ?Patient presents with  ? Follow-up  ?  B20 - patient reports having itchy dark spots on skin x 2 months. Patient requesting a cream  ?  ? ? ?HPI: ?[place] interpretation via tablet.  ? ?Candace Jones is here for follow-up HIV/AIDS, Toxoplasmosis encephalitis (completed induciton therapy and on 2x daily DS bactrim for maintenance).   ? ? ? ?Labs 2 weeks ago with TCells 165 and VL < 20 on once daily dovato. Continues on Bactrim BID  ? ?Completed 6 weeks of PO Augmentin for un-drained perirectal abscess. Her symptoms have resolved. No problems with passing bowel movements and urine incontinence is better. ? ?Complaining of itchy dark spots on her skim for 2 months now.  This had started back in the hospital when she was really sick. It seems to be getting better but there are still some very itchy spots that come up continually. They are all over her body, arms, thighs and some on her face.  ? ?She would like to switch back to [facility] on [address] as it is closer and she prefers to pick it up.  ? ?Wt Readings from Last 3 Encounters:  ?09/22/21 209 lb (94.8 kg)  ?09/02/21 212 lb (96.2 kg)  ?08/12/21 211 lb (95.7 kg)  ? ? ?Review of Systems  ?Constitutional:  Negative for fever and unexpected weight change.  ?Gastrointestinal:  Negative for diarrhea, nausea and rectal pain.  ?Genitourinary:  Negative for menstrual problem, pelvic pain and urgency.  ?Skin:   Positive for color change and rash.  ?Psychiatric/Behavioral:  Negative for sleep disturbance.   ? ? ? ?Past Medical History:  ?Diagnosis Date  ? AIDS (acquired immune deficiency syndrome) ([place])   ? Hydronephrosis   ? Hyperkalemia 11/05/2020  ? Perirectal abscess   ? Perirectal abscess 09/02/2021  ? 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 Urinary incontinence 09/02/2021  ? ? ?Outpatient Medications Prior to Visit  ?Medication Sig Dispense Refill  ? dolutegravir-lamiVUDine (DOVATO) 50-300 MG tablet Take 1 tablet by mouth daily. 30 tablet 11  ? levETIRAcetam (KEPPRA) 750 MG tablet Take 1 tablet (750 mg total) by mouth 2 (two) times daily. 60 tablet 11  ? sulfamethoxazole-trimethoprim (BACTRIM DS) 800-160 MG tablet Take 1 tablet by mouth 2 (two) times daily. 60 tablet 11  ? Brimonidine Tartrate (LUMIFY) 0.025 % SOLN Apply to eye. (Patient not taking: Reported on 09/22/2021)    ? Olopatadine HCl 0.2 % SOLN Apply to eye. (Patient not taking: Reported on 09/22/2021)    ? amoxicillin-clavulanate (AUGMENTIN) 875-125 MG tablet Take 1 tablet by mouth 2 (two) times daily. (Patient not taking: Reported on 09/22/2021)    ? ?No facility-administered medications prior to visit.  ?  ? ?Allergies  ?Allergen Reactions  ? Tenofovir Other (See Comments)  ?  Patient started on Biktarvy - acute renal failure shortly after with creatinine 5.2. Unclear  if related to TAF but would approach with caution if re-trialed.   ? ? ?Social History  ? ?Tobacco Use  ? Smoking status: Never  ? Smokeless tobacco: Never  ?Substance Use Topics  ? Alcohol use: Never  ? Drug use: Never  ? ? ?Family History  ?Problem Relation Age of Onset  ? Breast cancer Neg Hx   ? ? ?Social History  ? ?Substance and Sexual Activity  ?Sexual Activity Yes  ? Birth control/protection: None  ? Comment: accepted condoms  ? ? ? ?Objective:  ? ?Vitals:  ? 09/22/21 0915  ?BP: 120/80  ?Pulse: 75  ?Temp: 98.3 ?F (36.8 ?C)  ?TempSrc: Oral  ?SpO2: 99%  ?Weight: 209 lb (94.8 kg)   ? ?Body mass index is 34.78 kg/m?. ? ?Physical Exam ?Vitals reviewed.  ?Constitutional:   ?   Appearance: Normal appearance. She is not ill-appearing.  ?Cardiovascular:  ?   Rate and Rhythm: Normal rate and regular rhythm.  ?Musculoskeletal:     ?   General: Normal range of motion.  ?Skin: ?   General: Skin is warm and dry.  ?   Findings: Rash present.  ?   Comments: Hyperpigmented maculopapular rash noted over skin on arms, thighs, face, stomach/abdomen. Various stages of healing. Some are scratched open. None appear infected. Many of the chronic spots that have always been there are flat and non-textured.   ?Neurological:  ?   Mental Status: She is alert.  ? ? ? ?Lab Results ?Lab Results  ?Component Value Date  ? WBC 3.1 (L) 09/02/2021  ? HGB 8.2 (L) 09/02/2021  ? HCT 27.9 (L) 09/02/2021  ? MCV 77.3 (L) 09/02/2021  ? PLT 226 09/02/2021  ?  ?Lab Results  ?Component Value Date  ? CREATININE 0.70 09/02/2021  ? BUN 13 09/02/2021  ? NA 135 09/02/2021  ? K 3.9 09/02/2021  ? CL 106 09/02/2021  ? CO2 24 09/02/2021  ?  ?Lab Results  ?Component Value Date  ? ALT 12 09/02/2021  ? AST 22 09/02/2021  ? ALKPHOS 79 01/07/2021  ? BILITOT 0.5 09/02/2021  ?  ?Lab Results  ?Component Value Date  ? CHOL 123 01/07/2021  ? HDL 55 01/07/2021  ? LDLCALC 51 01/07/2021  ? TRIG 85 01/07/2021  ? CHOLHDL 2.2 01/07/2021  ? ?HIV 1 RNA Quant (Copies/mL)  ?Date Value  ?09/02/2021 Not Detected  ?06/30/2021 Not Detected  ?03/31/2021 <20 (H)  ? ?CD4 T Cell Abs (/uL)  ?Date Value  ?09/02/2021 165 (L)  ?06/30/2021 147 (L)  ?03/31/2021 121 (L)  ? ? ? ?Assessment & Plan:  ? ?Problem List Items Addressed This Visit   ? ?  ? High  ? AIDS (acquired immune deficiency syndrome) ([place]) - Primary  ?  Doing well with slow reconstitution of immune system. Last CD4 165. Continue Bactrim BID proph and dovato. Will send Rx back to preferred pharmacy that is closer for her to pick up.  ?VL and CD4 reviewed from last appt with Dr. Tommy Medal 3w prior.  ?RTC in 42m for  ongoing care.  ?  ?  ? Relevant Medications  ? sulfamethoxazole-trimethoprim (BACTRIM DS) 800-160 MG tablet  ? dolutegravir-lamiVUDine (DOVATO) 50-300 MG tablet  ? Other Relevant Orders  ? HIV 1 RNA quant-no reflex-bld  ? T-helper cells (CD4) count (not at [facility])  ?  ? Unprioritized  ? 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  Continue secondary prophylaxis bactrim BID.  ?Keppra 750 mg BID still on as well - I  think she can probably come off of this given > 64yr treatment. Will reach out to [facility] team to see if they can help with recs to come off of it. She missed her appt in January.  ?  ?  ? Relevant Medications  ? sulfamethoxazole-trimethoprim (BACTRIM DS) 800-160 MG tablet  ? dolutegravir-lamiVUDine (DOVATO) 50-300 MG tablet  ? levETIRAcetam (KEPPRA) 750 MG tablet  ? Perirectal abscess  ?  She completed nearly 8 weeks of augmentin and her symptoms have resolved completely without draining her abscess. She is not interested in repeating her scan and would like to see how she does off antibiotics. Her urinary / menstrual symptoms seem improved also.  ?Will follow her clinically off antibiotics.  ?  ?  ? Pruritic rash  ?  Unclear the etiology of her rash. May be pityriasis but I am not familiar with this presenting on face. Will use antihistamines and topical steroids to see if they help with symptoms. Ultimately may need derm referral to evaluate.  ?No bug or animal exposures to her knowledge. Pretty wide spread on exposed and skin under clothing.  ?  ?  ? Urinary incontinence  ?  Seems quiescent at this time - I am not sure how much the perirectal abscess really contributed to her symptoms but she does not want to consider IR uterine artery embolization at this time for her large fibroids.  ?Will follow.  ?  ?  ?Janene Madeira, MSN, NP-C ?[place] [facility] Infectious Disease ?[facility] ?Pager: [phone] ?Office: [phone] ? ?09/22/21  ?1:14 PM ? ? ?

## 2021-09-22 NOTE — Assessment & Plan Note (Addendum)
Continue secondary prophylaxis bactrim BID.  ?Keppra 750 mg BID still on as well - I think she can probably come off of this given > 54yrtreatment. [facility]s to come off of it. She missed her appt in January.  ?

## 2021-10-14 ENCOUNTER — Encounter: Payer: Self-pay | Admitting: Neurology

## 2021-10-14 ENCOUNTER — Ambulatory Visit: Payer: Medicaid Other | Admitting: Neurology

## 2021-10-14 VITALS — BP 132/87 | HR 64 | Ht 65.0 in | Wt 213.0 lb

## 2021-10-14 DIAGNOSIS — B582 Toxoplasma meningoencephalitis: Secondary | ICD-10-CM

## 2021-10-14 DIAGNOSIS — G40909 Epilepsy, unspecified, not intractable, without status epilepticus: Secondary | ICD-10-CM | POA: Diagnosis not present

## 2021-10-14 DIAGNOSIS — Z5181 Encounter for therapeutic drug level monitoring: Secondary | ICD-10-CM

## 2021-10-14 DIAGNOSIS — B20 Human immunodeficiency virus [HIV] disease: Secondary | ICD-10-CM

## 2021-10-14 NOTE — Patient Instructions (Signed)
Continue les meme medicaments ?Vous pouvez essayer Tylenol pour les douleurs ?Vous pouvez aussi essayer Centrum Multivitamin  ?Retournez dans un an ? ? ? ?Continues current medications  ?Trial of Tylenol for neck pain  ?Can try Centrum Multivitamin  ?Follow up in 1 year  ?

## 2021-10-14 NOTE — Progress Notes (Signed)
? ? ?Reason for visit: Seizures follow up ? ?Referring physician: [facility] ? ?Candace Jones is a 41 y.o. female ? ?INTERVAL HISTORY 10/14/2021 ?Patient presents for follow-up, last visit was in July with Dr. Jannifer Franklin.  Since that time she denies any additional seizures.  She reports completing treatment for the brain infection.  She is compliant with Keppra 750 mg twice daily, denies any side effect from the medication, denies any seizure or seizure-like activity. ?Her current complaint or rash and itchiness for which she has been prescribed cream which patient report helps her a lot.  There is also complain of some neck stiffness and pain but patient is not taking any ibuprofen or Tylenol for the pain. ? ?History of present illness (Dr. Jannifer Franklin): ?Candace Jones is a 41 year old right-handed black African female with a history of HIV infection, AIDS, and a recent toxoplasmosis infection of the brain.  The patient presented to the hospital on 29 September 2020 with right-sided weakness and had a witnessed generalized seizure event in the emergency room.  The patient was noted to have severe edema of the brain affecting the frontal and temporal areas in 2 locations with ring enhancement.  The patient was determined to have an infection from toxoplasmosis.  She has been started on appropriate medical treatment, and the most recent MRI shows good improvement in the edema of the brain.  The patient was placed on Keppra while in the hospital, she remains on 750 mg twice daily.  She seems to tolerate this fairly well.  The patient still reports occasional headaches and some visual blurring.  The headaches are associated with sharp lancinating pains that are quite brief but relatively frequent.  She has light sensitivity.  She has chronic right foot pain following a fall on 16 September 2020.  The x-ray at that time showed a chronic fracture of the navicular bone with sclerosis.  The patient has not had any gait instability or  difficulty controlling the bowels or the bladder.  She still has some underlying fatigue.  She is no longer working since the hospitalization.  She does not operate a motor vehicle.  She reports some neck stiffness which is improved with heat.  She is sent to this office for further evaluation. ? ?Past Medical History:  ?Diagnosis Date  ? AIDS (acquired immune deficiency syndrome) ([place])   ? Hydronephrosis   ? Hyperkalemia 11/05/2020  ? Perirectal abscess   ? Perirectal abscess 09/02/2021  ? 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 Urinary incontinence 09/02/2021  ? ? ?Past Surgical History:  ?Procedure Laterality Date  ? APPLICATION OF CRANIAL NAVIGATION Left 10/02/2020  ? Procedure: APPLICATION OF CRANIAL NAVIGATION;  Surgeon: Newman Pies, MD;  Location: [place];  Service: Neurosurgery;  Laterality: Left;  ? buttock surgery    ? CRANIOTOMY Left 10/02/2020  ? Procedure: LEFT CRANIOTOMY FOR TUMOR BIOPSY/ RESECTION with BrainLab;  Surgeon: Newman Pies, MD;  Location: [place];  Service: Neurosurgery;  Laterality: Left;  ? IR RADIOLOGIST EVAL & MGMT  10/17/2020  ? IR RADIOLOGIST EVAL & MGMT  12/02/2020  ? ? ?Family History  ?Problem Relation Age of Onset  ? Breast cancer Neg Hx   ? ? ?Social history:  reports that she has never smoked. She has never used smokeless tobacco. She reports that she does not drink alcohol and does not use drugs. ? ?Medications:  ? ?Current Outpatient Medications:  ?  dolutegravir-lamiVUDine (DOVATO) 50-300 MG tablet, Take 1 tablet by mouth daily., Disp: 30  tablet, Rfl: 11 ?  levETIRAcetam (KEPPRA) 750 MG tablet, Take 1 tablet (750 mg total) by mouth 2 (two) times daily., Disp: 60 tablet, Rfl: 11 ?  sulfamethoxazole-trimethoprim (BACTRIM DS) 800-160 MG tablet, Take 1 tablet by mouth 2 (two) times daily., Disp: 60 tablet, Rfl: 11 ?  triamcinolone ointment (KENALOG) 0.5 %, Apply 1 application. topically 2 (two) times daily., Disp: 30 g, Rfl: 2  ?  ?Allergies  ?Allergen Reactions  ?  Tenofovir Other (See Comments)  ?  Patient started on Biktarvy - acute renal failure shortly after with creatinine 5.2. Unclear if related to TAF but would approach with caution if re-trialed.   ? ? ?ROS: ? ?Out of a complete 14 system review of symptoms, the patient complains only of the following symptoms, and all other reviewed systems are negative. ? ?Right foot pain ?Headaches ?Visual blurring ?Fatigue ? ?Height '5\' 5"'$  (1.651 m), weight 213 lb (96.6 kg). ? ?Physical Exam ? ?General: The patient is alert and cooperative at the time of the examination. ? ?Eyes: Pupils are equal, round, and reactive to light. Discs are flat bilaterally. ? ?Neck: The neck is supple, no carotid bruits are noted. ? ?Respiratory: The respiratory examination is clear. ? ?Cardiovascular: The cardiovascular examination reveals a regular rate and rhythm, no obvious murmurs or rubs are noted. ? ?Skin: Extremities are without significant edema. ? ?Neurologic Exam ? ?Mental status: The patient is alert and oriented x 3 at the time of the examination. The patient has apparent normal recent and remote memory, with an apparently normal attention span and concentration ability. ? ?Cranial nerves: Facial symmetry is present. There is good sensation of the face to pinprick and soft touch on the left, decreased on the right. The strength of the facial muscles and the muscles to head turning and shoulder shrug are normal bilaterally. Speech is well enunciated, no aphasia or dysarthria is noted. Extraocular movements are full. Visual fields are full. The tongue is midline, and the patient has symmetric elevation of the soft palate. No obvious hearing deficits are noted. ? ?Motor: The motor testing reveals 5 over 5 strength of all 4 extremities. Good symmetric motor tone is noted throughout. ? ?Sensory: Sensory testing is intact to pinprick, soft touch, vibration sensation, and position sense on all 4 extremities, with exception some decreased pinprick  sensation on the right leg as compared to the left. No evidence of extinction is noted. ? ?Coordination: Cerebellar testing reveals good finger-nose-finger and heel-to-shin bilaterally. ? ?Gait and station: Gait is normal. Tandem gait is normal. Romberg is negative. No drift is seen. ? ?Reflexes: Deep tendon reflexes are symmetric and normal bilaterally. Toes are downgoing bilaterally. ? ? ?MRI brain 09/30/20: ?Two cystic brain masses in the left cerebral hemisphere with extensive vasogenic edema and mild midline shift. Metastatic disease is the leading diagnostic consideration but the mass morphology isnotable for a discrete nodular component, colloid vesicular neurocysticercosis should also be considered. ?  ?* MRI scan images were reviewed online. I agree with the written report. ? ? ?EEG 10/01/20: ? ?IMPRESSION: ?This study is suggestive of cortical dysfunction in left temporal region likely secondary underlying structural abnormality, ictal, postictal state. Additionally there is evidence of moderate diffuse encephalopathy, nonspecific etiology.  No seizures or epileptiform discharges were seen throughout the recording. ? ? ? ?Assessment/Plan: ? ?1.  HIV infection, AIDS ? ?2.  S/p Cerebral toxoplasmosis, left brain,  ? ?3.  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 ?The patient has had a good response to  antibiotic therapy, she is now on antiviral therapy for the HIV infection.  She is to remain on Keppra, but she may be able to come off the medication within the next 12 months.  I will see her in 1 year, at that time, I will repeat Brain MRI and EEG and depending on results, I will take her off [place]. This was discussed with the patient and she is comfortable with plans.  ? ? ?Alric Ran, MD  ?10/14/2021 11:09 AM ? ?[facility] ?[place], [place] [address] ? ?Phone [phone] Fax [phone] ? ?

## 2021-10-15 LAB — LEVETIRACETAM LEVEL: Levetiracetam Lvl: 17.5 ug/mL (ref 10.0–40.0)

## 2021-10-15 LAB — CALCIUM: Calcium: 8.5 mg/dL — ABNORMAL LOW (ref 8.7–10.2)

## 2021-10-15 LAB — VITAMIN D 25 HYDROXY (VIT D DEFICIENCY, FRACTURES): Vit D, 25-Hydroxy: 22.7 ng/mL — ABNORMAL LOW (ref 30.0–100.0)

## 2021-10-15 MED ORDER — VITAMIN D (ERGOCALCIFEROL) 1.25 MG (50000 UNIT) PO CAPS: 50000.0000 [IU] | ORAL_CAPSULE | ORAL | 0 refills | Status: DC

## 2021-10-15 MED ORDER — ONE DAILY MULTIVITAMIN WOMEN PO TABS: 1.0000 | ORAL_TABLET | Freq: Every day | ORAL | 3 refills | Status: DC

## 2021-10-15 NOTE — Addendum Note (Signed)
Addended byAlric Ran on: 10/15/2021 04:51 PM ? ? Modules accepted: Orders ? ?

## 2021-10-15 NOTE — Progress Notes (Signed)
Spoke with patient, discussed Low Vit D and Ca level. Will send her Vitamin D supplement and order for daily multivitamin. She is comfortable with plans  ? ?Dr. April Manson

## 2021-10-19 ENCOUNTER — Telehealth: Payer: Self-pay | Admitting: Licensed Clinical Social Worker

## 2021-10-19 NOTE — Telephone Encounter (Signed)
Pt stopped by [facility] office with paperwork from [place]. This Probation officer was contacted to assist. Per chart review I noted pt does not see any Heart or Vascular providers. I met with pt and pt daughter, they have brought with them a letter from [place] regarding SNAP benefits. It is noted that pt daughter worked at [employer] job and they need a letter of termination. Pt states that her daughter doesn't work- she attends school, they had googled that staffing company and the address sent them to our office. I indicated how pt could speak with caseworker at [facility] directly and provided her with that address. Encouraged her to go in person after she left voicemail to speak with caseworker to let them know she could not provide that letter. Unable to assist further as pt no a pt at our office.  ? ?No additional needs from our clinic at this time.  ? ?Westley Hummer, MSW, LCSW ?Clinical Social Worker II ?[facility]  ?[phone]- work cell phone (preferred) ?[phone]- desk phone ? ?

## 2021-11-13 ENCOUNTER — Other Ambulatory Visit: Payer: Self-pay

## 2021-11-13 MED ORDER — TRIAMCINOLONE ACETONIDE 0.5 % EX OINT: 1.0000 "application " | TOPICAL_OINTMENT | Freq: Two times a day (BID) | CUTANEOUS | 2 refills | Status: DC

## 2021-12-29 ENCOUNTER — Other Ambulatory Visit: Payer: Medicaid Other

## 2022-01-05 ENCOUNTER — Ambulatory Visit (INDEPENDENT_AMBULATORY_CARE_PROVIDER_SITE_OTHER): Payer: Medicaid Other | Admitting: Infectious Diseases

## 2022-01-05 ENCOUNTER — Encounter: Payer: Self-pay | Admitting: Infectious Diseases

## 2022-01-05 ENCOUNTER — Other Ambulatory Visit: Payer: Self-pay

## 2022-01-05 VITALS — BP 116/80 | HR 77 | Resp 16 | Ht 65.0 in | Wt 216.0 lb

## 2022-01-05 DIAGNOSIS — B20 Human immunodeficiency virus [HIV] disease: Secondary | ICD-10-CM

## 2022-01-05 DIAGNOSIS — K611 Rectal abscess: Secondary | ICD-10-CM | POA: Diagnosis not present

## 2022-01-05 DIAGNOSIS — M79671 Pain in right foot: Secondary | ICD-10-CM

## 2022-01-05 DIAGNOSIS — B582 Toxoplasma meningoencephalitis: Secondary | ICD-10-CM | POA: Diagnosis not present

## 2022-01-05 DIAGNOSIS — R569 Unspecified convulsions: Secondary | ICD-10-CM | POA: Diagnosis not present

## 2022-01-05 MED ORDER — DICLOFENAC SODIUM 1 % EX GEL: 4.0000 g | Freq: Four times a day (QID) | CUTANEOUS | 1 refills | Status: DC

## 2022-01-05 NOTE — Progress Notes (Addendum)
Name: Shalynn Jorstad  DOB: 10-01-1980 MRN: [id number] PCP: Elsie Stain, MD    Brief Narrative:  Candace Jones is a 41 y.o. 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      Subjective:   Chief Complaint  Patient presents with   Follow-up      HPI: [place] interpretor present to facilitate the visit.   Judieth is here for follow-up HIV/AIDS, Toxoplasmosis encephalitis (completed induciton therapy and on 2x daily DS bactrim for maintenance).  FU in May 2023 with Neurology - will repeat MRI / EEG in May 2024 and possibly able to D/C keppra then (Dr. April Manson).   She has no concerns about her medication and is doing well on her dovato QHS, bactrim BID and keppra BID. Appetite is great. No trouble with headaches / sleep.    Main concern today is her right foot - she had a fracture about 1 year ago - had to wear a brace and followed up with podiatry at that time out of [facility] in [place]. All of a sudden without explantation recently she has been experiencing new pain on the inner aspect of her right foot below her ankle bone. She does have pain and swelling. She is not able to work currently because of her foot.   She still has leaking of fluid from her vagina that she needs to wear a pad for. It is a brown/tanish color. She can differentiate when her cycles come - that is completely separate. This discharge has been consistent. She has no pain here since her last course of antibiotics. She first noticed the drainage coming out of her vagina when the drain was removed.    Review of Systems  Constitutional:  Negative for fever and  unexpected weight change.  Gastrointestinal:  Negative for diarrhea, nausea and rectal pain.  Genitourinary:  Negative for menstrual problem, pelvic pain and urgency.  Skin:  Positive for color change and rash.  Psychiatric/Behavioral:  Negative for sleep disturbance.       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 30 tablet 11   levETIRAcetam (KEPPRA) 750 MG tablet Take 1 tablet (750 mg total) by mouth 2 (two) times daily. 60 tablet 11   Multiple Vitamins-Minerals (ONE DAILY MULTIVITAMIN WOMEN) TABS Take 1 tablet by mouth daily. 90 tablet 3   sulfamethoxazole-trimethoprim (BACTRIM DS) 800-160 MG tablet Take 1 tablet by mouth 2 (two) times daily. 60 tablet 11   triamcinolone ointment (KENALOG) 0.5 % Apply 1 application. topically 2 (two) times daily. 30 g 2   Vitamin D, Ergocalciferol, (DRISDOL) 1.25 MG (50000 UNIT) CAPS capsule Take 1 capsule (50,000 Units total) by mouth every 7 (seven) days. 12 capsule 0   No facility-administered medications prior  to visit.     Allergies  Allergen Reactions   Tenofovir Other (See Comments)    Patient started on Biktarvy - acute renal failure shortly after with creatinine 5.2. Unclear if related to TAF but would approach with caution if re-trialed.     Social History   Tobacco Use   Smoking status: Never   Smokeless tobacco: Never  Substance Use Topics   Alcohol use: Never   Drug use: Never    Family History  Problem Relation Age of Onset   Breast cancer Neg Hx     Social History   Substance and Sexual Activity  Sexual Activity Yes   Birth  control/protection: None   Comment: accepted condoms     Objective:   Vitals:   01/05/22 0844  BP: 116/80  Pulse: 77  Resp: 16  SpO2: 100%  Weight: 216 lb (98 kg)  Height: $Remove'5\' 5"'QGZHKaY$  (1.651 m)   Body mass index is 35.94 kg/m.  Physical Exam Vitals reviewed.  Constitutional:      Appearance: Normal appearance. She is not ill-appearing.  HENT:     Mouth/Throat:     Mouth: Mucous membranes are moist.     Pharynx: Oropharynx is clear.  Eyes:     General: No scleral icterus. Cardiovascular:     Rate and Rhythm: Normal rate.  Pulmonary:     Effort: Pulmonary effort is normal.  Musculoskeletal:     Right foot: Normal range of motion. Swelling, deformity and bony tenderness present.       Legs:  Neurological:     Mental Status: She is alert and oriented to person, place, and time.  Psychiatric:        Mood and Affect: Mood normal.        Thought Content: Thought content normal.      Lab Results Lab Results  Component Value Date   WBC 3.1 (L) 09/02/2021   HGB 8.2 (L) 09/02/2021   HCT 27.9 (L) 09/02/2021   MCV 77.3 (L) 09/02/2021   PLT 226 09/02/2021    Lab Results  Component Value Date   CREATININE 0.70 09/02/2021   BUN 13 09/02/2021   NA 135 09/02/2021   K 3.9 09/02/2021   CL 106 09/02/2021   CO2 24 09/02/2021    Lab Results  Component Value Date   ALT 12 09/02/2021   AST 22 09/02/2021   ALKPHOS 79 01/07/2021   BILITOT 0.5 09/02/2021    Lab Results  Component Value Date   CHOL 123 01/07/2021   HDL 55 01/07/2021   LDLCALC 51 01/07/2021   TRIG 85 01/07/2021   CHOLHDL 2.2 01/07/2021   HIV 1 RNA Quant (Copies/mL)  Date Value  09/02/2021 Not Detected  06/30/2021 Not Detected  03/31/2021 <20 (H)   CD4 T Cell Abs (/uL)  Date Value  09/02/2021 165 (L)  06/30/2021 147 (L)  03/31/2021 121 (L)     Assessment & Plan:   Problem List Items Addressed This Visit       High   AIDS (acquired immune deficiency syndrome) ([place]) - Primary    Very well  controlled on once daily Dovato taken in the PM. No concerns with access or adherence to medication. They are tolerating the medication well without side effects. No drug interactions identified.  Her TCells are slowly increasing - last check $RemoveBe'@165'FQkWWHTXR$ . Continue the Bactrim BID for secondary proph until > 200 durably. Repeat CD4 and VL today.  No dental needs today.  No concern over  anxious/depressed mood.   Referral to [facility] today - Emma spent time meeting with her to see if there is anything that they can offer to support / connect to community resources and vocational assistance.   Return in about 4 months (around 05/08/2022).        Relevant Orders   T-helper cells (CD4) count   HIV 1 RNA quant-no reflex-bld     Unprioritized   Seizure ([place])    Appreciate neurology's assistance. Discussed and recounted that she may be able to consider stopping her keppra next year at follow up. She is hopeful.      Right foot pain    Reviewed podiatry's notes - she has abnormal navicular bone on imaging and what appears to be a deformity in this area on her right inner foot. I want her to go back to see her podiatrist for further recommendations. Information given to schedule today.  Will try topical diclofenac gel QID to see if this helps the pain. Would avoid systemic antiinflammatories with history of AKI.       Perirectal abscess    We spent a lot of time trying to figure out exactly where her drainage is coming from. It seems that she has similar appearing fluid come out from her previous rectal drain. She notes that the drainage from her vagina has started since these drains were removed. She did not have resolution of drainage on antibiotics.   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 Will need to refer back I think first to GYN for pelvic exam to see if any tract can be located.   She declined further work up at this time as she  needs to prioritize her foot pain so she can resume working and regain income again. I asked her to please call if she has any return of pain like she experienced in the past. Will continue to follow up on this for her.       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  No symptoms or concerning signs today.        Janene Madeira, MSN, NP-C [facility] [place] Pager: [phone] Office: [phone]  01/05/22  9:43 AM

## 2022-01-05 NOTE — Assessment & Plan Note (Signed)
Appreciate neurology's assistance. Discussed and recounted that she may be able to consider stopping her keppra next year at follow up. She is hopeful.

## 2022-01-05 NOTE — Assessment & Plan Note (Signed)
We spent a lot of time trying to figure out exactly where her drainage is coming from. It seems that she has similar appearing fluid come out from her previous rectal drain. She notes that the drainage from her vagina has started since these drains were removed. She did not have resolution of drainage on antibiotics.   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 Will need to refer back I think first to GYN for pelvic exam to see if any tract can be located.   She declined further work up at this time as she needs to prioritize her foot pain so she can resume working and regain income again. I asked her to please call if she has any return of pain like she experienced in the past. Will continue to follow up on this for her.

## 2022-01-05 NOTE — Assessment & Plan Note (Signed)
Reviewed podiatry's notes - she has abnormal navicular bone on imaging and what appears to be a deformity in this area on her right inner foot. I want her to go back to see her podiatrist for further recommendations. Information given to schedule today.  Will try topical diclofenac gel QID to see if this helps the pain. Would avoid systemic antiinflammatories with history of AKI.

## 2022-01-05 NOTE — Patient Instructions (Addendum)
For the pain on your foot - Diclofenac gel to the painful spot on your foot 3-4 times a day.  This is over the counter if your insurance will not pay for this.   Please call for an appointment to see if they can see you for your foot.   Pam Drown, DPM   [address], [address]   [place], [place] [address]   Phone: [phone]     CONTINUE your Dovato everyday  CONTINUE your Bactrim and Keppra twice a day   OK to take the multivitamin once a day in the morning or at lunch.   Will see you back in 4 months.  Please plan to get your flu shot around October or we can give this to you next visit.

## 2022-01-05 NOTE — Assessment & Plan Note (Signed)
Well treated and has been on secondary prophylaxis until immune reconstitution.  No symptoms or concerning signs today.

## 2022-01-05 NOTE — Assessment & Plan Note (Addendum)
Very well controlled on once daily Dovato taken in the PM. No concerns with access or adherence to medication. They are tolerating the medication well without side effects. No drug interactions identified.  Her TCells are slowly increasing - last check '@165'$ . Continue the Bactrim BID for secondary proph until > 200 durably. Repeat CD4 and VL today.  No dental needs today.  No concern over anxious/depressed mood.   Referral to [facility] today - Candace Jones spent time meeting with her to see if there is anything that they can offer to support / connect to community resources and vocational assistance.   Return in about 4 months (around 05/08/2022).

## 2022-01-06 LAB — T-HELPER CELLS (CD4) COUNT (NOT AT ARMC)
CD4 % Helper T Cell: 14 % — ABNORMAL LOW (ref 33–65)
CD4 T Cell Abs: 169 /uL — ABNORMAL LOW (ref 400–1790)

## 2022-01-08 LAB — HIV-1 RNA QUANT-NO REFLEX-BLD
HIV 1 RNA Quant: NOT DETECTED Copies/mL
HIV-1 RNA Quant, Log: NOT DETECTED Log cps/mL

## 2022-01-12 ENCOUNTER — Ambulatory Visit: Payer: Medicaid Other | Admitting: Infectious Diseases

## 2022-01-17 ENCOUNTER — Other Ambulatory Visit: Payer: Self-pay | Admitting: Neurology

## 2022-01-18 ENCOUNTER — Telehealth: Payer: Self-pay

## 2022-01-18 MED ORDER — TRIAMCINOLONE ACETONIDE 0.5 % EX OINT: 1.0000 | TOPICAL_OINTMENT | Freq: Two times a day (BID) | CUTANEOUS | 4 refills | Status: DC

## 2022-01-18 NOTE — Telephone Encounter (Signed)
Patient called office today stating he will be dropping off form for provider to sign. Would like office to call once completed. Leatrice Jewels, RMA

## 2022-01-18 NOTE — Telephone Encounter (Signed)
Received fax from [facility] requesting refill on Triamcinolone 0.5% ointment. Will forward message to provider to advise on refill. Leatrice Jewels, RMA

## 2022-01-18 NOTE — Telephone Encounter (Signed)
Refills provided and sent to pharmacy

## 2022-01-18 NOTE — Addendum Note (Signed)
Addended by: [place] Callas on: 01/18/2022 01:46 PM   Modules accepted: Orders

## 2022-01-28 IMAGING — CT CT PELVIS W/ CM
3 of 4 series · 11 of 46 positions shown, 18 images · IV contrast (omnipaque)
Comparison: 10/07/2020

CLINICAL DATA: Status post CT-guided percutaneous catheter drainage
of right-sided perirectal abscess on 10/07/2020. There is decrease
output from the drain and reflux of fluid from the catheter exit
site with flushing.

EXAM:
CT PELVIS WITH CONTRAST
TECHNIQUE: Multidetector CT imaging of the pelvis was performed using the
standard protocol following the bolus administration of intravenous
contrast.
CONTRAST:  80mL OMNIPAQUE IOHEXOL 300 MG/ML  SOLN

[Series 2: axial st · axial · 0.82mm/px · z∈[-249,-59]mm · 7 of 52 slices shown, 12 images]
[im 7/52  soft-tissue]
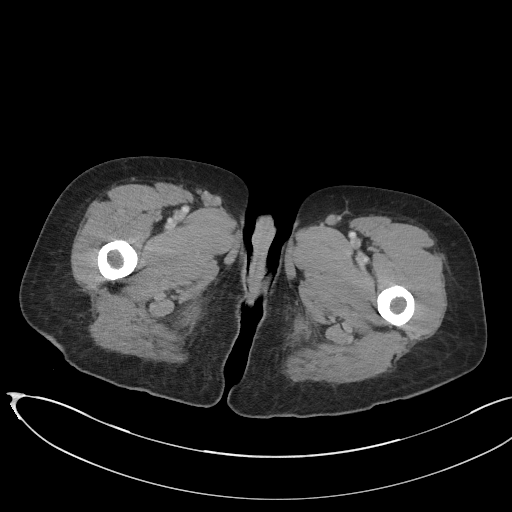
[im 7/52  bone]
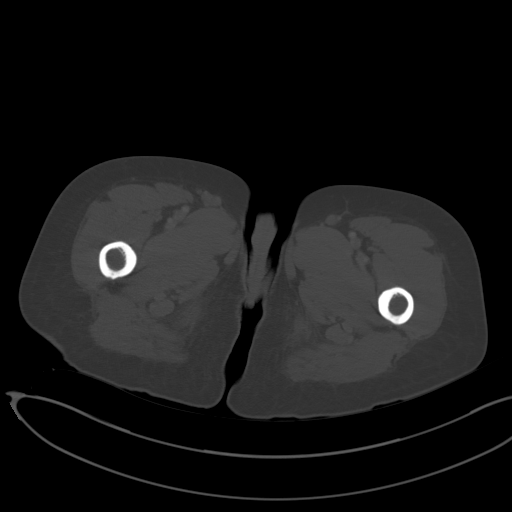
[im 13/52  soft-tissue]
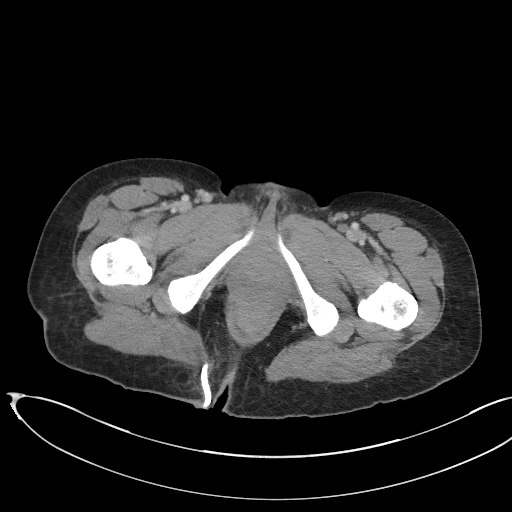
[im 20/52  soft-tissue]
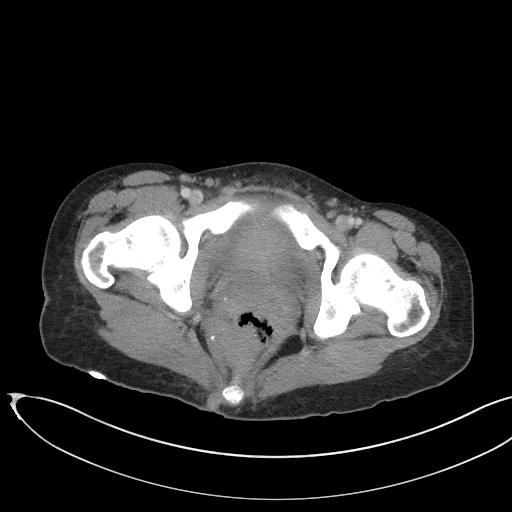
[im 26/52  soft-tissue]
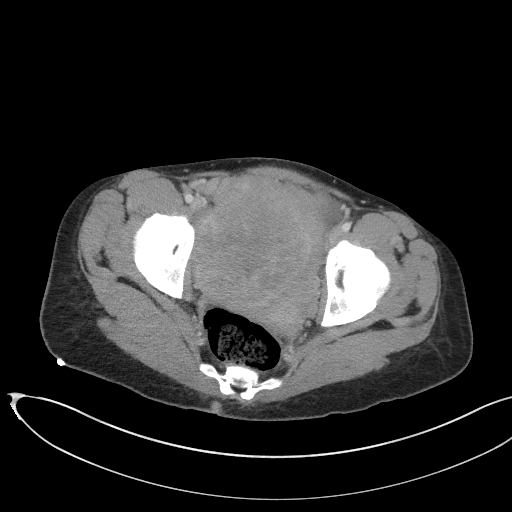
[im 26/52  lung]
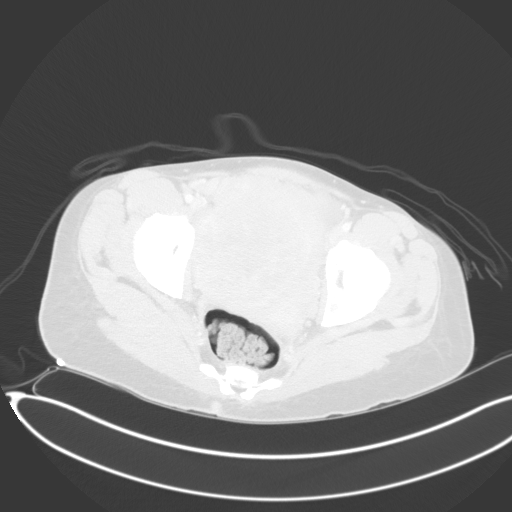
[im 32/52  soft-tissue]
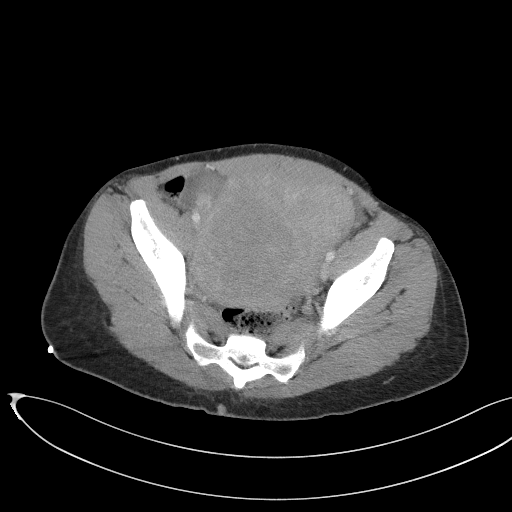
[im 32/52  lung]
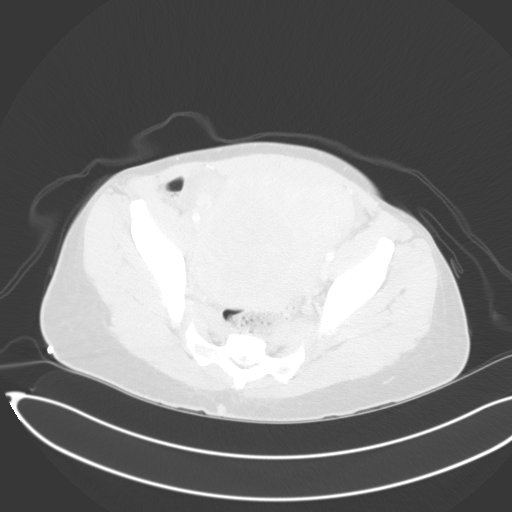
[im 39/52  soft-tissue]
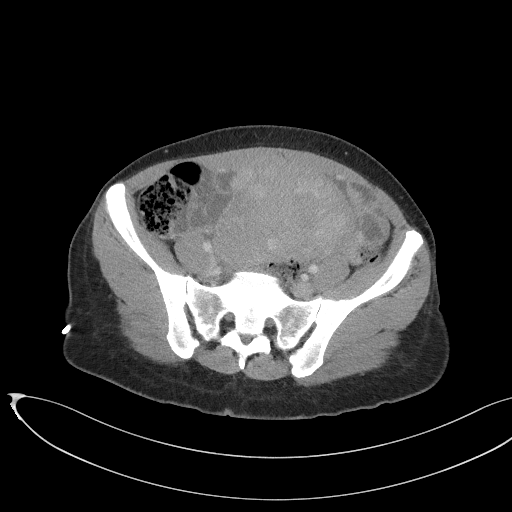
[im 39/52  lung]
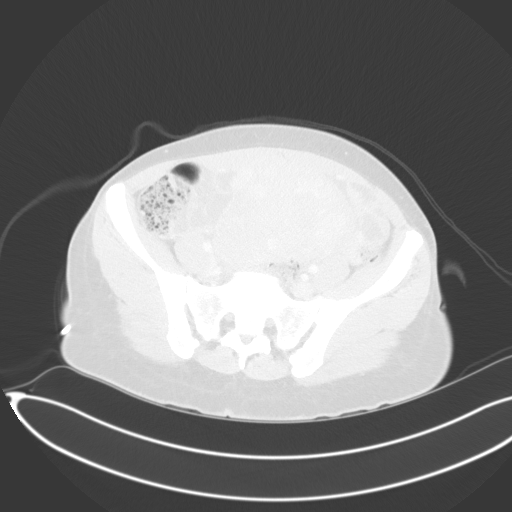
[im 45/52  soft-tissue]
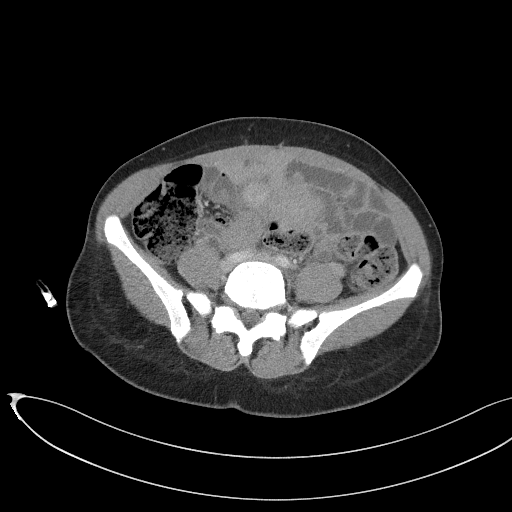
[im 45/52  lung]
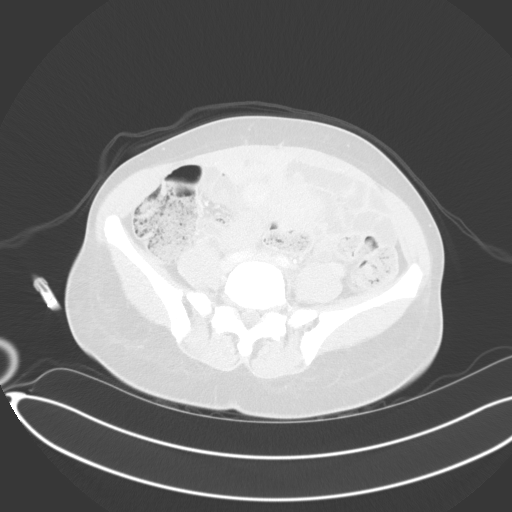

[Series 5: coronal st · coronal · 0.54mm/px · 3 of 87 slices shown, 4 images]
[im 29/87  soft-tissue]
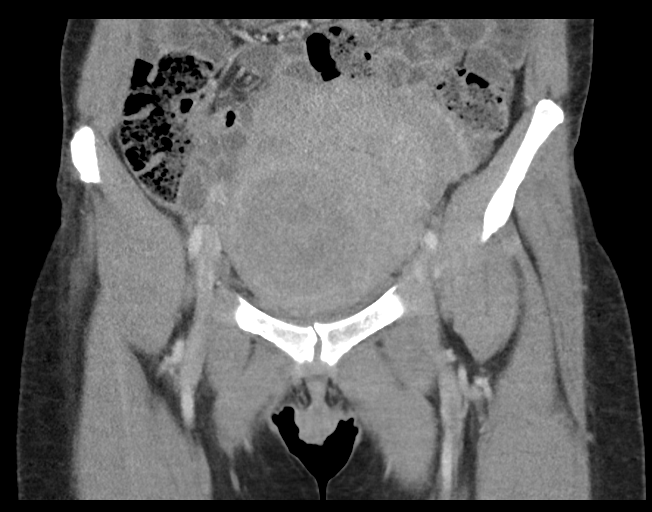
[im 39/87  soft-tissue]
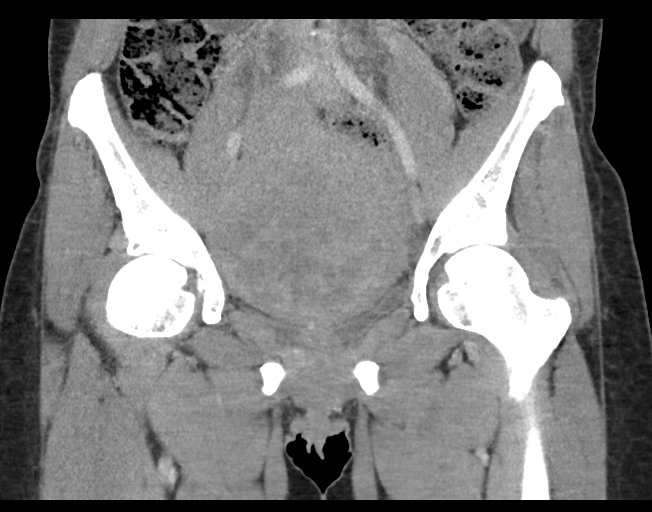
[im 39/87  bone]
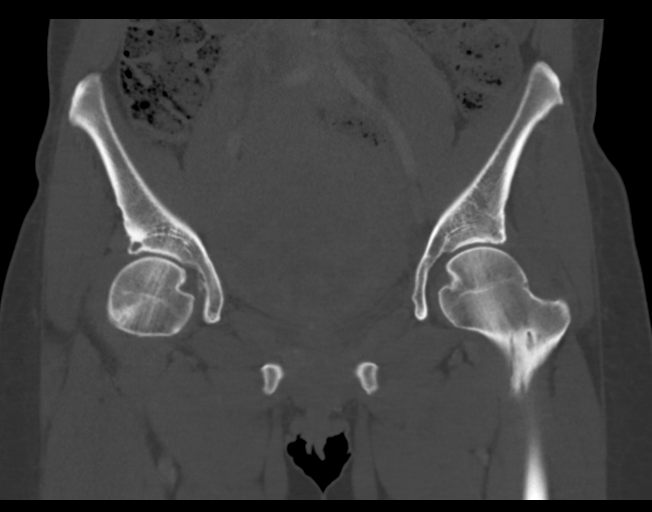
[im 48/87  soft-tissue]
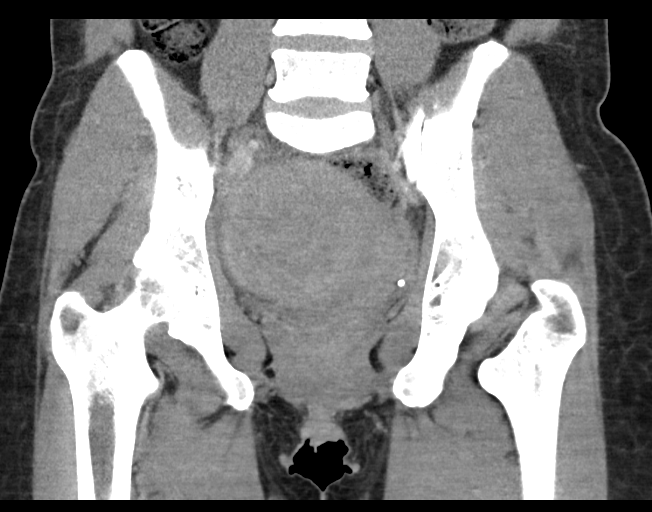

[Series 6: sagittal st · sagittal · 0.51mm/px · 1 of 117 slices shown, 2 images]
[im 39/117  soft-tissue]
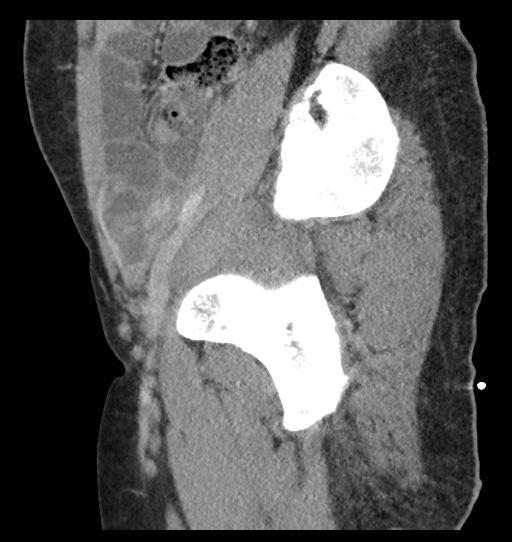
[im 39/117  bone]
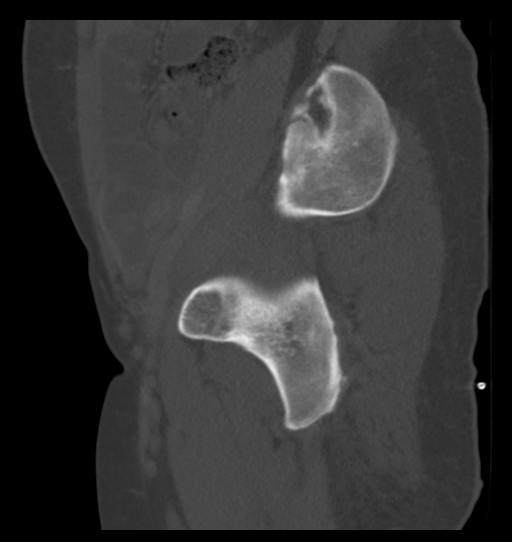

[11 of 46 positions shown; findings below may reference images not displayed]

FINDINGS: Urinary Tract:  No abnormality visualized.

Bowel: Unremarkable visualized pelvic bowel loops. No free
intraperitoneal air.

Vascular/Lymphatic: No pathologically enlarged lymph nodes. No
significant vascular abnormality seen.

Reproductive: Stable uterine enlargement from multiple underlying
fibroids.

Other: Right-sided perirectal drainage catheter remains present with
resolution of perirectal abscess. No new fluid collections. No
abnormal fluid in the pelvis.

Musculoskeletal: No suspicious bone lesions identified.
IMPRESSION: Resolution of right perirectal abscess after catheter drainage.

## 2022-03-06 ENCOUNTER — Other Ambulatory Visit: Payer: Self-pay

## 2022-03-06 ENCOUNTER — Encounter (HOSPITAL_COMMUNITY): Payer: Self-pay | Admitting: Emergency Medicine

## 2022-03-06 ENCOUNTER — Emergency Department (HOSPITAL_COMMUNITY)
Admission: EM | Admit: 2022-03-06 | Discharge: 2022-03-07 | Disposition: A | Discharge status: Home / Self Care | Payer: Medicaid Other | Attending: Emergency Medicine | Admitting: Emergency Medicine

## 2022-03-06 DIAGNOSIS — M545 Low back pain, unspecified: Secondary | ICD-10-CM | POA: Diagnosis not present

## 2022-03-06 DIAGNOSIS — Z21 Asymptomatic human immunodeficiency virus [HIV] infection status: Secondary | ICD-10-CM | POA: Diagnosis not present

## 2022-03-06 DIAGNOSIS — Y92410 Unspecified street and highway as the place of occurrence of the external cause: Secondary | ICD-10-CM | POA: Insufficient documentation

## 2022-03-06 DIAGNOSIS — M542 Cervicalgia: Secondary | ICD-10-CM | POA: Insufficient documentation

## 2022-03-06 DIAGNOSIS — R519 Headache, unspecified: Secondary | ICD-10-CM | POA: Diagnosis not present
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
## 2022-03-07 ENCOUNTER — Emergency Department (HOSPITAL_COMMUNITY): Payer: Medicaid Other

## 2022-03-07 MED ORDER — NAPROXEN 250 MG PO TABS
500.0000 mg | ORAL_TABLET | Freq: Once | ORAL | Status: AC
Start: 2022-03-07 — End: 2022-03-07
  Administered 2022-03-07: 500 mg via ORAL
  Filled 2022-03-07: qty 2

## 2022-03-07 MED ORDER — NAPROXEN 500 MG PO TABS: 500.0000 mg | ORAL_TABLET | Freq: Two times a day (BID) | ORAL | 0 refills | Status: DC
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
## 2022-03-07 NOTE — ED Notes (Signed)
Patient verbalizes understanding of discharge instructions. Opportunity for questioning and answers were provided. Armband removed by staff, pt discharged from ED. Pt taken to ED entrance via wheel chair.  

## 2022-03-07 NOTE — Discharge Instructions (Addendum)
You were evaluated in the Emergency Department and after careful evaluation, we did not find any emergent condition requiring admission or further testing in the hospital.  Your exam/testing today was overall reassuring.  CTs and x-rays did not show any significant injuries.  Take the Naprosyn as needed for pain.  Please return to the Emergency Department if you experience any worsening of your condition.  Thank you for allowing [place] to be a part of your care.

## 2022-03-29 ENCOUNTER — Telehealth: Payer: Self-pay | Admitting: Family Medicine

## 2022-03-29 ENCOUNTER — Ambulatory Visit: Payer: Medicaid Other | Admitting: Family Medicine

## 2022-03-29 NOTE — Telephone Encounter (Signed)
Pt was a no show 10/16 for a New Patient visit with Dr. Grandville Silos. This is her first, letter has been sent

## 2022-04-06 NOTE — Telephone Encounter (Signed)
1st no show, fee waived, letter sent 

## 2022-04-18 ENCOUNTER — Other Ambulatory Visit: Payer: Self-pay | Admitting: Infectious Diseases

## 2022-04-19 NOTE — Telephone Encounter (Signed)
Okay to refill or do you want to send to PCP? Thanks!

## 2022-05-24 ENCOUNTER — Encounter: Payer: Self-pay | Admitting: Family Medicine

## 2022-05-24 ENCOUNTER — Ambulatory Visit (INDEPENDENT_AMBULATORY_CARE_PROVIDER_SITE_OTHER): Payer: Medicaid Other | Admitting: Family Medicine

## 2022-05-24 VITALS — BP 134/82 | HR 70 | Temp 97.8°F | Ht 68.0 in | Wt 213.4 lb

## 2022-05-24 DIAGNOSIS — B20 Human immunodeficiency virus [HIV] disease: Secondary | ICD-10-CM

## 2022-05-24 DIAGNOSIS — S92254A Nondisplaced fracture of navicular [scaphoid] of right foot, initial encounter for closed fracture: Secondary | ICD-10-CM

## 2022-05-24 DIAGNOSIS — B582 Toxoplasma meningoencephalitis: Secondary | ICD-10-CM | POA: Diagnosis not present

## 2022-05-24 DIAGNOSIS — M79671 Pain in right foot: Secondary | ICD-10-CM

## 2022-05-24 DIAGNOSIS — H1589 Other disorders of sclera: Secondary | ICD-10-CM

## 2022-05-24 DIAGNOSIS — K611 Rectal abscess: Secondary | ICD-10-CM

## 2022-05-24 DIAGNOSIS — M79672 Pain in left foot: Secondary | ICD-10-CM

## 2022-05-24 DIAGNOSIS — H579 Unspecified disorder of eye and adnexa: Secondary | ICD-10-CM

## 2022-05-24 DIAGNOSIS — R569 Unspecified convulsions: Secondary | ICD-10-CM | POA: Diagnosis not present

## 2022-05-24 DIAGNOSIS — H04203 Unspecified epiphora, bilateral lacrimal glands: Secondary | ICD-10-CM

## 2022-05-24 MED ORDER — PATADAY 0.7 % OP SOLN: OPHTHALMIC | 0 refills | Status: DC

## 2022-05-24 MED ORDER — LEVOCETIRIZINE DIHYDROCHLORIDE 5 MG PO TABS: 5.0000 mg | ORAL_TABLET | Freq: Every evening | ORAL | 0 refills | Status: DC

## 2022-05-24 MED ORDER — FLUTICASONE PROPIONATE 50 MCG/ACT NA SUSP: 2.0000 | Freq: Every day | NASAL | 6 refills | Status: DC
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
## 2022-05-24 NOTE — Patient Instructions (Signed)
For watery eyes, try flonase, levoceterizine and pataday eye drops.  For ankle pain, we are referring to physical therapy and referring back to podiatry.

## 2022-05-27 ENCOUNTER — Ambulatory Visit (INDEPENDENT_AMBULATORY_CARE_PROVIDER_SITE_OTHER): Payer: Medicaid Other | Admitting: Infectious Diseases

## 2022-05-27 ENCOUNTER — Encounter: Payer: Self-pay | Admitting: Infectious Diseases

## 2022-05-27 ENCOUNTER — Other Ambulatory Visit: Payer: Self-pay

## 2022-05-27 ENCOUNTER — Ambulatory Visit (INDEPENDENT_AMBULATORY_CARE_PROVIDER_SITE_OTHER): Payer: Medicaid Other

## 2022-05-27 VITALS — BP 106/72 | HR 86 | Resp 16 | Ht 68.0 in | Wt 212.0 lb

## 2022-05-27 DIAGNOSIS — Z23 Encounter for immunization: Secondary | ICD-10-CM

## 2022-05-27 DIAGNOSIS — B582 Toxoplasma meningoencephalitis: Secondary | ICD-10-CM

## 2022-05-27 DIAGNOSIS — B20 Human immunodeficiency virus [HIV] disease: Secondary | ICD-10-CM

## 2022-05-27 DIAGNOSIS — R569 Unspecified convulsions: Secondary | ICD-10-CM | POA: Diagnosis not present

## 2022-05-27 DIAGNOSIS — R21 Rash and other nonspecific skin eruption: Secondary | ICD-10-CM | POA: Diagnosis not present

## 2022-05-27 DIAGNOSIS — K611 Rectal abscess: Secondary | ICD-10-CM

## 2022-05-27 MED ORDER — TRIAMCINOLONE ACETONIDE 0.1 % EX OINT: TOPICAL_OINTMENT | Freq: Three times a day (TID) | CUTANEOUS | 1 refills | Status: DC | PRN

## 2022-05-27 MED ORDER — ATOVAQUONE 750 MG/5ML PO SUSP: 750.0000 mg | Freq: Two times a day (BID) | ORAL | 5 refills | Status: DC

## 2022-05-27 MED ORDER — DOVATO 50-300 MG PO TABS: 1.0000 | ORAL_TABLET | Freq: Every day | ORAL | 11 refills | Status: DC

## 2022-05-27 NOTE — Patient Instructions (Addendum)
Will switch the antibiotic (trimethoprim-sulfamethoxazole) to a different option that is liquid called atovaquone for prevention of the brain infection you had when we first met.  Take 5 mL twice a day with food.   Continue the Dovato once a day like you have been doing.

## 2022-05-27 NOTE — Assessment & Plan Note (Signed)
FU in May 2024 to determine if she can come off the keppra.

## 2022-05-27 NOTE — Assessment & Plan Note (Signed)
I worry she may be having an allergic reaction to bactrim. Will switch to atovaquone 750 mg (6m) BID for secondary prophylaxis until CD4 > 200. She has had a slow recovery of CD4 but has been well suppressed on Dovato. Not clear we can safely stop at this time; I don't want to put her at risk of relapse.

## 2022-05-27 NOTE — Assessment & Plan Note (Addendum)
Previously in July, we spent a lot of time trying to figure out exactly where her drainage was coming from. It seems that she has similar appearing fluid come out from her previous drain. She notes that the drainage from her perineum has started since these drains were removed. She did not have resolution of drainage on antibiotics.    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 Will refer to [facility] for evaluation and consideration of further management given this has recurred a few times despite percutaneous drainage and antibiotics. Will defer further imaging for now to surgery so they have what they prefer for any potential planning. Previously she was not able to pursue this work up, but feels she can proceed now that other medical needs are tended to.
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
## 2022-05-27 NOTE — Assessment & Plan Note (Signed)
Concern over possible sulfa reaction as this has been worsening problem for her. Switch to non-sulfa med for secondary prophy.  Will try to get either triamcinolone 50/50 formation with lotion or will try to get larger volume of triamcinolone for her to have more ample supply of.  Does not seem to be due to any cosmetic/hygiene product or animal/insect exposure. If refractory to the above, will refer her back to PCP for evaluation and consideration of possible biopsy to help with etiology.

## 2022-05-27 NOTE — Assessment & Plan Note (Addendum)
Very well controlled on once daily Dovato. No concerns with access or adherence to medication. They are tolerating the medication well without side effects. No drug interactions identified. Pertinent lab tests ordered today.  No changes to insurance coverage.  No dental needs today.  No concern over anxious/depressed mood.  Sexual health and family planning discussed - no needs today.  Vaccines updated today - COVID vaccine given   Will need to discuss cervical cancer screening at next OV.   Return in about 4 months (around 09/26/2022).

## 2022-05-28 LAB — T-HELPER CELLS (CD4) COUNT (NOT AT ARMC)
CD4 % Helper T Cell: 16 % — ABNORMAL LOW (ref 33–65)
CD4 T Cell Abs: 268 /uL — ABNORMAL LOW (ref 400–1790)

## 2022-05-31 LAB — HIV-1 RNA QUANT-NO REFLEX-BLD
HIV 1 RNA Quant: NOT DETECTED Copies/mL
HIV-1 RNA Quant, Log: NOT DETECTED Log cps/mL

## 2022-06-01 ENCOUNTER — Ambulatory Visit: Payer: Medicaid Other | Attending: Family Medicine

## 2022-06-01 DIAGNOSIS — M79672 Pain in left foot: Secondary | ICD-10-CM | POA: Insufficient documentation

## 2022-06-01 DIAGNOSIS — M25572 Pain in left ankle and joints of left foot: Secondary | ICD-10-CM | POA: Diagnosis present

## 2022-06-01 DIAGNOSIS — R2689 Other abnormalities of gait and mobility: Secondary | ICD-10-CM | POA: Diagnosis present

## 2022-06-01 DIAGNOSIS — M25571 Pain in right ankle and joints of right foot: Secondary | ICD-10-CM | POA: Insufficient documentation

## 2022-06-01 DIAGNOSIS — S92254A Nondisplaced fracture of navicular [scaphoid] of right foot, initial encounter for closed fracture: Secondary | ICD-10-CM | POA: Insufficient documentation

## 2022-06-01 DIAGNOSIS — M79671 Pain in right foot: Secondary | ICD-10-CM | POA: Diagnosis not present

## 2022-06-01 DIAGNOSIS — M25671 Stiffness of right ankle, not elsewhere classified: Secondary | ICD-10-CM | POA: Diagnosis present

## 2022-06-01 NOTE — Progress Notes (Signed)
Please call Candace Jones with [place] interpretor to let her know that her immune system looks much improved! It is up to 268 now. Much stronger.   She should continue the dovato everyday   She can stop the Atovaquone (the new liquid medication in 3 months - continue twice a day through March 1st).   I hope she has a happy holiday and I look forward to seeing her again at her next appointment.
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
## 2022-06-02 ENCOUNTER — Encounter: Payer: Self-pay | Admitting: Podiatry

## 2022-06-02 ENCOUNTER — Telehealth: Payer: Self-pay

## 2022-06-02 ENCOUNTER — Ambulatory Visit (INDEPENDENT_AMBULATORY_CARE_PROVIDER_SITE_OTHER): Payer: Medicaid Other

## 2022-06-02 ENCOUNTER — Other Ambulatory Visit: Payer: Self-pay | Admitting: Podiatry

## 2022-06-02 ENCOUNTER — Ambulatory Visit (INDEPENDENT_AMBULATORY_CARE_PROVIDER_SITE_OTHER): Payer: Medicaid Other | Admitting: Podiatry

## 2022-06-02 DIAGNOSIS — M79671 Pain in right foot: Secondary | ICD-10-CM

## 2022-06-02 DIAGNOSIS — M1990 Unspecified osteoarthritis, unspecified site: Secondary | ICD-10-CM | POA: Diagnosis not present

## 2022-06-02 DIAGNOSIS — M19279 Secondary osteoarthritis, unspecified ankle and foot: Secondary | ICD-10-CM

## 2022-06-02 NOTE — Telephone Encounter (Signed)
-----   Message from [name], NP sent at 06/01/2022  4:35 PM EST ----- Please call Candace Jones with [place] interpretor to let her know that her immune system looks much improved! It is up to 268 now. Much stronger.   She should continue the dovato everyday   She can stop the Atovaquone (the new liquid medication in 3 months - continue twice a day through March 1st).   I hope she has a happy holiday and I look forward to seeing her again at her next appointment.

## 2022-06-02 NOTE — Progress Notes (Signed)
Subjective:  Patient ID: Candace Jones, female    DOB: May 07, 1981,  MRN: [id number]  Chief Complaint  Patient presents with   Foot Pain    Right foot pain since April of 2021     41 y.o. female presents with the above complaint.  Patient presents with complaint of right dorsal ankle pain.  Patient states been present for quite some time is progressive gotten worse worse with ambulation has been going on since April 2021.  She has not seen anyone else prior to seeing me denies any other acute complaints would like to discuss treatment options for this.  Pain scale 7 out of 10 dull achy in nature.   Review of Systems: Negative except as noted in the HPI. Denies N/V/F/Ch.  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   Urinary incontinence 09/02/2021    Current Outpatient Medications:    atovaquone (MEPRON) 750 MG/5ML suspension, Take 5 mLs (750 mg total) by mouth 2 (two) times daily with a meal., Disp: 210 mL, Rfl: 5   diclofenac Sodium (VOLTAREN) 1 % GEL, APPLY 4 GRAMS TOPICALLY TO THE AFFECTED AREA FOUR TIMES DAILY, Disp: 300 g, Rfl: 6   dolutegravir-lamiVUDine (DOVATO) 50-300 MG tablet, Take 1 tablet by mouth daily., Disp: 30 tablet, Rfl: 11   fluticasone (FLONASE) 50 MCG/ACT nasal spray, Place 2 sprays into both nostrils daily., Disp: 16 g, Rfl: 6   levETIRAcetam (KEPPRA) 750 MG tablet, Take 1 tablet (750 mg total) by mouth 2 (two) times daily., Disp: 60 tablet, Rfl: 11   levocetirizine (XYZAL ALLERGY 24HR) 5 MG tablet, Take 1 tablet (5 mg total) by mouth every evening., Disp: 30 tablet, Rfl: 0   naproxen (NAPROSYN) 500 MG tablet, Take 1 tablet (500 mg total) by mouth 2 (two) times daily., Disp: 30 tablet, Rfl: 0   Olopatadine HCl (PATADAY) 0.7 % SOLN, Use 1 drop in each eye daily, Disp:  2.5 mL, Rfl: 0   triamcinolone 0.1% oint-Cerave equivalent lotion 1:1 mixture, Apply topically 3 (three) times daily as needed., Disp: 454 g, Rfl: 1   triamcinolone ointment (KENALOG) 0.5 %, Apply 1 Application topically 2 (two) times daily., Disp: 30 g, Rfl: 4   Vitamin D, Ergocalciferol, (DRISDOL) 1.25 MG (50000 UNIT) CAPS capsule, Take 1 capsule (50,000 Units total) by mouth every 7 (seven) days., Disp: 12 capsule, Rfl: 0  Social History   Tobacco Use  Smoking Status Never   Passive exposure: Never  Smokeless Tobacco Never    Allergies  Allergen Reactions   Tenofovir Other (See Comments)    Patient started on Biktarvy - acute renal failure shortly after with creatinine 5.2. Unclear if related to TAF but would approach with caution if re-trialed.    Objective:  There were no vitals filed for this visit. There is no height or weight on file to calculate BMI. Constitutional Well developed. Well nourished.  Vascular Dorsalis pedis pulses palpable bilaterally. Posterior tibial pulses palpable bilaterally. Capillary refill normal to all digits.  No cyanosis or clubbing noted. Pedal hair growth normal.  Neurologic Normal speech. Oriented to person, place, and time. Epicritic sensation to light touch grossly present bilaterally.  Dermatologic Nails well groomed and normal in appearance. No open wounds. No skin lesions.  Orthopedic: Pain on palpation to the right TN joint.  No pain  at the subtalar joint.  No pain at the ankle joint.  Pain with range of motion of the talonavicular joint.  No pain at the calcaneocuboid joint.  No pain at the Achilles tendon peroneal tendon posterior tibial tendon   Radiographs: 3 views of skeletally mature adult right foot:.  Talonavicular joint arthritis noted.  No other bony abnormalities identified.  Pes planovalgus foot structure noted. Assessment:   1. 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  Right talonavicular joint arthritis -All questions and concerns were discussed with the patient in extensive detail given the amount of pain that she is having she will benefit from a steroid injection of decrease inflammatory component associate with pain.  Patient agrees with plan to proceed with steroid injection.  All -A steroid injection was performed at  using 1% plain Lidocaine and 10 mg of Kenalog. This was well tolerated. -I also ordered a CT scan to assess the talonavicular joint given the severe amount of arthritis that is present.  Will also rule out hindfoot/subtalar joint arthritis.   No follow-ups on file.

## 2022-06-02 NOTE — Telephone Encounter (Signed)
Spoke with [place] via [place] interpreter, relayed that her immune system is much improved. Advised that [name] would like for her to continue her Dovato every day and continue with the Atovaquone for three more months and that she can stop on 08/13/22. Damaris verbalized understanding.   She asks about her lotion that was prescribed at the last appointment as she hasn't heard anything about it. Relayed that it was sent on 12/14 to [facility] on [address].   Beryle Flock, RN

## 2022-06-16 ENCOUNTER — Ambulatory Visit: Payer: Medicaid Other | Attending: Family Medicine | Admitting: Physical Therapy

## 2022-06-16 ENCOUNTER — Encounter: Payer: Self-pay | Admitting: Physical Therapy

## 2022-06-16 DIAGNOSIS — R2689 Other abnormalities of gait and mobility: Secondary | ICD-10-CM | POA: Insufficient documentation

## 2022-06-16 DIAGNOSIS — M25572 Pain in left ankle and joints of left foot: Secondary | ICD-10-CM | POA: Diagnosis present

## 2022-06-16 DIAGNOSIS — M25671 Stiffness of right ankle, not elsewhere classified: Secondary | ICD-10-CM

## 2022-06-16 DIAGNOSIS — M25571 Pain in right ankle and joints of right foot: Secondary | ICD-10-CM | POA: Diagnosis not present

## 2022-06-16 NOTE — Therapy (Signed)
OUTPATIENT PHYSICAL THERAPY LOWER EXTREMITY TRATMENT   Patient Name: Candace Jones MRN: [id number] DOB:April 19, 1981, 42 y.o., female Today's Date: 06/16/2022  END OF SESSION:  PT End of Session - 06/16/22 0855     Visit Number 2    Date for PT Re-Evaluation 08/24/22    PT Start Time 0762    PT Stop Time 0930    PT Time Calculation (min) 35 min    Activity Tolerance Patient tolerated treatment well    Behavior During Therapy [facility] for tasks assessed/performed             Past Medical History:  Diagnosis Date   AIDS (acquired immune deficiency syndrome) ([place])    Hydronephrosis    Hyperkalemia 11/05/2020   Perirectal abscess    Perirectal abscess 09/02/2021   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   Urinary incontinence 09/02/2021   Past Surgical History:  Procedure Laterality Date   APPLICATION OF CRANIAL NAVIGATION Left 10/02/2020   Procedure: APPLICATION OF CRANIAL NAVIGATION;  Surgeon: Newman Pies, MD;  Location: [place];  Service: Neurosurgery;  Laterality: Left;   buttock surgery     CRANIOTOMY Left 10/02/2020   Procedure: LEFT CRANIOTOMY FOR TUMOR BIOPSY/ RESECTION with BrainLab;  Surgeon: Newman Pies, MD;  Location: [place];  Service: Neurosurgery;  Laterality: Left;   IR RADIOLOGIST EVAL & MGMT  10/17/2020   IR RADIOLOGIST EVAL & MGMT  12/02/2020   Patient Active Problem List   Diagnosis Date Noted   Right foot pain 01/05/2022   Skin eruption 09/22/2021   Perirectal abscess 09/02/2021   Blurry vision    Therapeutic drug monitoring 11/05/2020   MRI contraindicated due to metal implant    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 Doing ok, no pain, but feet are swollen, L>R  PERTINENT HISTORY: AIDS PAIN:  Are you having pain? Yes: NPRS scale: 0/10 Pain location: bilateral feet Pain description: ache, sharp, feels like needles poking me Aggravating factors: walking, standing Relieving factors: nothing  PRECAUTIONS: None  WEIGHT BEARING RESTRICTIONS: No  FALLS:  Has patient fallen in last 6 months? No  LIVING ENVIRONMENT: Lives with: lives with their family Lives in: House/apartment Stairs: Yes: Internal: 15 steps; on right going up Has following equipment at home: None  OCCUPATION: not currently working  PLOF: Independent  PATIENT GOALS: to get rid of pain  NEXT MD VISIT:   OBJECTIVE:   PATIENT SURVEYS:  FOTO 41  COGNITION: Overall cognitive status: Within functional limits for tasks assessed     SENSATION: WFL   PALPATION: Some swelling on R ankle, not TTP  LOWER EXTREMITY ROM:  Active ROM Right eval Left eval  Hip flexion    Hip extension    Hip abduction    Hip adduction    Hip internal rotation    Hip external rotation    Knee flexion    Knee extension    Ankle dorsiflexion 10 w/pain 20  Ankle plantarflexion 25 w/pain 35  Ankle inversion 20 w/pain 30  Ankle eversion 20 w/pain 25   (Blank rows = not tested)  LOWER EXTREMITY MMT: grossly 4/5 for bilateral ankles   FUNCTIONAL TESTS:  5 times sit to stand: 13.68s Timed up and go (TUG): 10.22s  GAIT: Distance walked: in clinic distances Assistive device utilized: None Level of assistance: Complete Independence Comments: increased pronation of B ankle/foot   TODAY'S TREATMENT:                                                                                                                               DATE: 06/16/22 NuStep L3 x 5 min LE only Bilateral 4 way ankle red x12 each  Seated sit fit ankle AROM all directions  S2S on airex 2x10 Alt cone taps on airex      06/01/22-EVAL    PATIENT EDUCATION:  Education details: POC, HEP, and education about getting supportive shoes Person educated: Patient Education method: Explanation Education comprehension: verbalized understanding  HOME EXERCISE PROGRAM: TBD  ASSESSMENT:  CLINICAL IMPRESSION: Pt 9 minute late for today's session reporting no pain only swelling in both ankles. Burning reported with 4 way ankle Tband. Some LE weakness reported with sit to stand. Sone instability noted with cone taps. Tactiel cue to needed to prevent LE form moving with ankle ROM.  OBJECTIVE IMPAIRMENTS: Abnormal gait, decreased balance, decreased mobility, difficulty walking, decreased ROM, decreased strength, and pain.   REHAB POTENTIAL: Good  CLINICAL DECISION MAKING: Stable/uncomplicated  EVALUATION COMPLEXITY: Low  GOALS: Goals reviewed with patient? No  SHORT TERM GOALS: Target date: 07/13/22  Patient will be independent with initial HEP. Goal status: INITIAL  LONG TERM GOALS: Target date: 08/24/22  Patient will be independent with advanced/ongoing HEP to improve outcomes and carryover.  Goal status: INITIAL  2.  Patient will report at least 75% improvement in bilateral ankle/foot pain to improve QOL. Goal status: INITIAL  3.  Patient will demonstrate improved R ankle AROM to [facility] to allow for normal gait and stair mechanics. Goal status: INITIAL  4.  Patient will be able to tolerate at least 71mns of walking without pain.  Goal status: INITIAL  5.  Patient will report 757on FOTO to demonstrate improved functional ability. Baseline: 65 Goal status: INITIAL    PLAN:  PT FREQUENCY: 1-2x/week  PT DURATION: 10 weeks  PLANNED INTERVENTIONS: Therapeutic exercises, Therapeutic activity,  Neuromuscular re-education, Balance training, Gait training, Patient/Family education, Self Care, Joint mobilization, Stair training, Cryotherapy, Moist heat, Vasopneumatic device, Ionotophoresis '4mg'$ /ml Dexamethasone, and Manual therapy  PLAN FOR NEXT SESSION: ankle ROM and strengthening, calf raises, ankleTB, resisted gait, establish HEP   RScot Jun PTA 06/16/2022, 8:57 AM

## 2022-06-17 NOTE — Therapy (Signed)
OUTPATIENT PHYSICAL THERAPY LOWER EXTREMITY TRATMENT   Patient Name: Candace Jones MRN: [id number] DOB:[birth year], 42 y.o., female Today's Date: 06/18/2022  END OF SESSION:  PT End of Session - 06/18/22 0841     Visit Number 3    Date for PT Re-Evaluation 08/24/22    PT Start Time 6503    PT Stop Time 0925    PT Time Calculation (min) 42 min    Activity Tolerance Patient tolerated treatment well    Behavior During Therapy [facility] for tasks assessed/performed              Past Medical History:  Diagnosis Date   AIDS (acquired immune deficiency syndrome) ([place])    Hydronephrosis    Hyperkalemia 11/05/2020   Perirectal abscess    Perirectal abscess 09/02/2021   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   Urinary incontinence 09/02/2021   Past Surgical History:  Procedure Laterality Date   APPLICATION OF CRANIAL NAVIGATION Left 10/02/2020   Procedure: APPLICATION OF CRANIAL NAVIGATION;  Surgeon: Newman Pies, MD;  Location: [place];  Service: Neurosurgery;  Laterality: Left;   buttock surgery     CRANIOTOMY Left 10/02/2020   Procedure: LEFT CRANIOTOMY FOR TUMOR BIOPSY/ RESECTION with BrainLab;  Surgeon: Newman Pies, MD;  Location: [place];  Service: Neurosurgery;  Laterality: Left;   IR RADIOLOGIST EVAL & MGMT  10/17/2020   IR RADIOLOGIST EVAL & MGMT  12/02/2020   Patient Active Problem List   Diagnosis Date Noted   Right foot pain 01/05/2022   Skin eruption 09/22/2021   Perirectal abscess 09/02/2021   Blurry vision    Therapeutic drug monitoring 11/05/2020   MRI contraindicated due to metal implant    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   AIDS (acquired immune deficiency syndrome) ([place])    Fibroid uterus    Pelvic pain    Seizure ([place]) 09/29/2020   Iron deficiency anemia 09/29/2020    PCP: Josephine Igo  REFERRING PROVIDER: Josephine Igo  REFERRING DIAG: T46.568, [phone], [phone]  THERAPY DIAG:  Pain in right ankle and joints of right  foot  Pain in left ankle and joints of left foot  Stiffness of right ankle, not elsewhere classified  Other abnormalities of gait and mobility  Rationale for Evaluation and Treatment: Rehabilitation  ONSET DATE: 03/06/22  SUBJECTIVE:   SUBJECTIVE STATEMENT: Doing good, having a little pain.   PERTINENT HISTORY: AIDS PAIN:  Are you having pain? Yes: NPRS scale: 1/10 Pain location: bilateral feet Pain description: ache, sharp, feels like needles poking me Aggravating factors: walking, standing Relieving factors: nothing  PRECAUTIONS: None  WEIGHT BEARING RESTRICTIONS: No  FALLS:  Has patient fallen in last 6 months? No  LIVING ENVIRONMENT: Lives with: lives with their family Lives in: House/apartment Stairs: Yes: Internal: 15 steps; on right going up Has following equipment at home: None  OCCUPATION: not currently working  PLOF: Independent  PATIENT GOALS: to get rid of pain  NEXT MD VISIT:   OBJECTIVE:   PATIENT SURVEYS:  FOTO 7  COGNITION: Overall cognitive status: Within functional limits for tasks assessed     SENSATION: WFL   PALPATION: Some swelling on R ankle, not TTP  LOWER EXTREMITY ROM:  Active ROM Right eval Left eval  Hip flexion    Hip extension    Hip abduction    Hip adduction    Hip internal rotation    Hip external rotation    Knee flexion    Knee extension    Ankle dorsiflexion 10 w/pain  20  Ankle plantarflexion 25 w/pain 35  Ankle inversion 20 w/pain 30  Ankle eversion 20 w/pain 25   (Blank rows = not tested)  LOWER EXTREMITY MMT: grossly 4/5 for bilateral ankles   FUNCTIONAL TESTS:  5 times sit to stand: 13.68s Timed up and go (TUG): 10.22s  GAIT: Distance walked: in clinic distances Assistive device utilized: None Level of assistance: Complete Independence Comments: increased pronation of B ankle/foot   TODAY'S TREATMENT:                                                                                                                               DATE: 06/18/22 Bike L3 x45mns  Resisted gait 20# 4 way x4  Step ups on 4" on airex  Calf stretch 30s on bar  Calf raises 2x10 Toe raises 2x10  Ankle theraband 4 way red 2x10 Walking on beam   06/16/22 NuStep L3 x 5 min LE only Bilateral 4 way ankle red x12 each  Seated sit fit ankle AROM all directions  S2S on airex 2x10 Alt cone taps on airex      06/01/22-EVAL    PATIENT EDUCATION:  Education details: POC, HEP, and education about getting supportive shoes Person educated: Patient Education method: Explanation Education comprehension: verbalized understanding  HOME EXERCISE PROGRAM: TBD  ASSESSMENT:  CLINICAL IMPRESSION: Patient return after first session, reports she feels better than last time. Has some pain with tband strengthening on R ankle with dorsiflexion and eversion. Does well with all other interventions today.   OBJECTIVE IMPAIRMENTS: Abnormal gait, decreased balance, decreased mobility, difficulty walking, decreased ROM, decreased strength, and pain.   REHAB POTENTIAL: Good  CLINICAL DECISION MAKING: Stable/uncomplicated  EVALUATION COMPLEXITY: Low  GOALS: Goals reviewed with patient? No  SHORT TERM GOALS: Target date: 07/13/22  Patient will be independent with initial HEP. Goal status: INITIAL  LONG TERM GOALS: Target date: 08/24/22  Patient will be independent with advanced/ongoing HEP to improve outcomes and carryover.  Goal status: INITIAL  2.  Patient will report at least 75% improvement in bilateral ankle/foot pain to improve QOL. Goal status: INITIAL  3.  Patient will demonstrate improved R ankle [facility] allow for normal gait and stair mechanics. Goal status: INITIAL  4.  Patient will be able to tolerate at least 344ms of walking without pain.  Goal status: INITIAL  5.  Patient will report 7339n FOTO to demonstrate improved functional ability. Baseline: 65 Goal status: INITIAL     PLAN:  PT FREQUENCY: 1-2x/week  PT DURATION: 10 weeks  PLANNED INTERVENTIONS: Therapeutic exercises, Therapeutic activity, Neuromuscular re-education, Balance training, Gait training, Patient/Family education, Self Care, Joint mobilization, Stair training, Cryotherapy, Moist heat, Vasopneumatic device, Ionotophoresis '4mg'$ /ml Dexamethasone, and Manual therapy  PLAN FOR NEXT SESSION: ankle ROM and strengthening, calf raises, ankleTB, resisted gait, establish HEP   [employer]TRW AutomotivePT 06/18/2022, 9:26 AM

## 2022-06-18 ENCOUNTER — Ambulatory Visit: Payer: Medicaid Other

## 2022-06-18 DIAGNOSIS — R2689 Other abnormalities of gait and mobility: Secondary | ICD-10-CM

## 2022-06-18 DIAGNOSIS — M25671 Stiffness of right ankle, not elsewhere classified: Secondary | ICD-10-CM

## 2022-06-18 DIAGNOSIS — M25571 Pain in right ankle and joints of right foot: Secondary | ICD-10-CM | POA: Diagnosis not present

## 2022-06-18 DIAGNOSIS — M25572 Pain in left ankle and joints of left foot: Secondary | ICD-10-CM

## 2022-06-21 ENCOUNTER — Encounter: Payer: Self-pay | Admitting: Family Medicine

## 2022-06-21 ENCOUNTER — Ambulatory Visit (INDEPENDENT_AMBULATORY_CARE_PROVIDER_SITE_OTHER): Payer: Medicaid Other | Admitting: Family Medicine

## 2022-06-21 VITALS — BP 124/82 | HR 79 | Temp 97.4°F | Wt 213.0 lb

## 2022-06-21 DIAGNOSIS — H04203 Unspecified epiphora, bilateral lacrimal glands: Secondary | ICD-10-CM | POA: Diagnosis not present

## 2022-06-21 DIAGNOSIS — K0889 Other specified disorders of teeth and supporting structures: Secondary | ICD-10-CM

## 2022-06-21 DIAGNOSIS — H1013 Acute atopic conjunctivitis, bilateral: Secondary | ICD-10-CM | POA: Diagnosis not present

## 2022-06-21 DIAGNOSIS — H579 Unspecified disorder of eye and adnexa: Secondary | ICD-10-CM | POA: Diagnosis not present

## 2022-06-21 MED ORDER — LEVOCETIRIZINE DIHYDROCHLORIDE 5 MG PO TABS: 5.0000 mg | ORAL_TABLET | Freq: Every evening | ORAL | 0 refills | Status: DC

## 2022-06-21 MED ORDER — PATADAY 0.7 % OP SOLN: OPHTHALMIC | 0 refills | Status: DC

## 2022-06-21 MED ORDER — FLUTICASONE PROPIONATE 50 MCG/ACT NA SUSP: 2.0000 | Freq: Every day | NASAL | 6 refills | Status: DC

## 2022-06-21 NOTE — Progress Notes (Signed)
Assessment/Plan:   Problem List Items Addressed This Visit       Digestive   Tooth loose    Patients present with a protruding and mobile lower incisor without pain or trauma.  Plan:  Referral to dentistry for further evaluation and management.      Relevant Orders   Ambulatory referral to Dentistry     Other   Allergic conjunctivitis of both eyes - Primary    Patient continues to experience watery, itchy eyes likely related to allergic conjunctivitis, showing partial improvement with Flonase.  Plan:  Continue Flonase with a follow-up to assess response. Rx Olopatadine 0.7% drops and Xyzal tablets. Reassessment in 4 weeks or sooner if symptoms worsen. Consider referral to an ophthalmologist for persistent symptoms despite treatment.      Other Visit Diagnoses     Watery eyes       Relevant Medications   fluticasone (FLONASE) 50 MCG/ACT nasal spray   Olopatadine HCl (PATADAY) 0.7 % SOLN   levocetirizine (XYZAL ALLERGY 24HR) 5 MG tablet   Itchy eyes       Relevant Medications   fluticasone (FLONASE) 50 MCG/ACT nasal spray   Olopatadine HCl (PATADAY) 0.7 % SOLN   levocetirizine (XYZAL ALLERGY 24HR) 5 MG tablet          Subjective:  HPI:  Candace Jones is a 42 y.o. female who has Seizure ([place]); Iron deficiency anemia; AIDS (acquired immune deficiency syndrome) ([place]); Fibroid uterus; Pelvic pain; CNS toxoplasmosis history; MRI contraindicated due to metal implant; Therapeutic drug monitoring; Blurry vision; Perirectal abscess; Skin eruption; Right foot pain; Allergic conjunctivitis of both eyes; and Tooth loose on their problem list..   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   CHIEF COMPLAINT: Patient presents for follow-up regarding  eye symptoms and loose tooth concern.   Problem 1: Patient has been experiencing watery and itchy eyes for approximately three years, which has been partially relieved by Flonase, Pataday 0.7% eyedrops, and Xyzal 5 mg daily.  Prescribed by the clinician. Despite some improvement, symptoms persist, delete that. Problem 2: The patient also reports that one of her lower incisors is protruding forward and wiggling, suggesting a potential dental issue. This has been occurring since the previous year without preceding trauma or pain.  REVIEW OF SYSTEMS: Eyes: Reports difficulty reading and sensitivity to light, improved slightly with Flonase. ENT: Protruding lower incisor, no signs of trauma, or infection noted on examination. All other systems are reviewed and negative.  Past Surgical History:  Procedure Laterality Date   APPLICATION OF CRANIAL NAVIGATION Left 10/02/2020   Procedure: APPLICATION OF CRANIAL NAVIGATION;  Surgeon: Newman Pies, MD;  Location: [place];  Service: Neurosurgery;  Laterality: Left;   buttock surgery     CRANIOTOMY Left 10/02/2020   Procedure: LEFT CRANIOTOMY FOR TUMOR BIOPSY/ RESECTION with BrainLab;  Surgeon: Newman Pies, MD;  Location: [place];  Service: Neurosurgery;  Laterality: Left;   IR RADIOLOGIST EVAL & MGMT  10/17/2020   IR RADIOLOGIST EVAL & MGMT  12/02/2020    Outpatient Medications Prior to Visit  Medication Sig Dispense Refill   dolutegravir-lamiVUDine (DOVATO) 50-300 MG tablet Take 1 tablet by mouth daily. 30 tablet 11   fluticasone (FLONASE) 50 MCG/ACT nasal spray Place 2 sprays into both nostrils daily. 16 g 6   atovaquone (MEPRON) 750 MG/5ML suspension Take 5 mLs (750  mg total) by mouth 2 (two) times daily with a meal. (Patient not taking: Reported on 06/21/2022) 210 mL 5   diclofenac Sodium (VOLTAREN) 1 % GEL APPLY 4 GRAMS TOPICALLY TO THE AFFECTED AREA FOUR TIMES DAILY (Patient not taking: Reported on 06/21/2022) 300 g 6   levETIRAcetam (KEPPRA) 750  MG tablet Take 1 tablet (750 mg total) by mouth 2 (two) times daily. (Patient not taking: Reported on 06/21/2022) 60 tablet 11   naproxen (NAPROSYN) 500 MG tablet Take 1 tablet (500 mg total) by mouth 2 (two) times daily. (Patient not taking: Reported on 06/21/2022) 30 tablet 0   triamcinolone 0.1% oint-Cerave equivalent lotion 1:1 mixture Apply topically 3 (three) times daily as needed. (Patient not taking: Reported on 06/21/2022) 454 g 1   triamcinolone ointment (KENALOG) 0.5 % Apply 1 Application topically 2 (two) times daily. (Patient not taking: Reported on 06/21/2022) 30 g 4   Vitamin D, Ergocalciferol, (DRISDOL) 1.25 MG (50000 UNIT) CAPS capsule Take 1 capsule (50,000 Units total) by mouth every 7 (seven) days. (Patient not taking: Reported on 06/21/2022) 12 capsule 0   levocetirizine (XYZAL ALLERGY 24HR) 5 MG tablet Take 1 tablet (5 mg total) by mouth every evening. (Patient not taking: Reported on 06/21/2022) 30 tablet 0   Olopatadine HCl (PATADAY) 0.7 % SOLN Use 1 drop in each eye daily (Patient not taking: Reported on 06/21/2022) 2.5 mL 0   No facility-administered medications prior to visit.    Family History  Problem Relation Age of Onset   Breast cancer Neg Hx     Social History   Socioeconomic History   Marital status: Single    Spouse name: Not on file   Number of children: Not on file   Years of education: Not on file   Highest education level: Not on file  Occupational History   Not on file  Tobacco Use   Smoking status: Never    Passive exposure: Never   Smokeless tobacco: Never  Vaping Use   Vaping Use: Never used  Substance and Sexual Activity   Alcohol use: Never   Drug use: Never   Sexual activity: Yes    Birth control/protection: None    Comment: accepted condoms  Other Topics Concern   Not on file  Social History Narrative   Lives at home with daughter.  Working:  not working since April 2022 since surgery.  Education:  HS in [place].  [place].   Social  Determinants of Health   Financial Resource Strain: Not on file  Food Insecurity: Not on file  Transportation Needs: Not on file  Physical Activity: Not on file  Stress: Not on file  Social Connections: Not on file  Intimate Partner Violence: Not on file                                                                                                 Objective:  Physical Exam: BP 124/82 (BP Location: Left Arm, Patient Position: Sitting, Cuff Size: Large)   Pulse 79   Temp (!) 97.4 F (36.3 C) (Temporal)   Wt 213  lb (96.6 kg)   LMP 05/28/2022   SpO2 99%   BMI 32.39 kg/m    General: No acute distress. Awake and conversant.  Eyes: Normal conjunctiva, anicteric. Round symmetric pupils.  ENT: there is a lower left incisor protruding forward, no signs of trama, no gum abnormalities, tooth does not appear to have a cary, it is nontender, and does not move with force is applied, Hearing grossly intact. No nasal discharge.  Neck: Neck is supple. No masses or thyromegaly.  Respiratory: Respirations are non-labored. No auditory wheezing.  Skin: Warm. No rashes or ulcers.  Psych: Alert and oriented. Cooperative, Appropriate mood and affect, Normal judgment.  CV: No cyanosis or JVD MSK: Normal ambulation. No clubbing  Neuro: Sensation and CN II-XII grossly normal.        Alesia Banda, MD, MS

## 2022-06-21 NOTE — Patient Instructions (Addendum)
For eye irritation, please use the flonase, levoceterizine, and olopatadine 0.7% eye drops  For loose tooth, we are referring to dentistry, Office will call to help set up schedule.

## 2022-06-23 ENCOUNTER — Encounter: Payer: Self-pay | Admitting: Physical Therapy

## 2022-06-23 ENCOUNTER — Ambulatory Visit: Payer: Medicaid Other | Admitting: Physical Therapy

## 2022-06-23 DIAGNOSIS — M25572 Pain in left ankle and joints of left foot: Secondary | ICD-10-CM

## 2022-06-23 DIAGNOSIS — M25571 Pain in right ankle and joints of right foot: Secondary | ICD-10-CM

## 2022-06-23 DIAGNOSIS — M25671 Stiffness of right ankle, not elsewhere classified: Secondary | ICD-10-CM

## 2022-06-23 NOTE — Therapy (Signed)
OUTPATIENT PHYSICAL THERAPY LOWER EXTREMITY TRATMENT   Patient Name: Karuna Balducci MRN: [id number] DOB:December 27, 1980, 42 y.o., female Today's Date: 06/23/2022  END OF SESSION:  PT End of Session - 06/23/22 0845     Visit Number 4    Date for PT Re-Evaluation 08/24/22    PT Start Time 0845    PT Stop Time 0930    PT Time Calculation (min) 45 min    Activity Tolerance Patient tolerated treatment well    Behavior During Therapy [facility] for tasks assessed/performed              Past Medical History:  Diagnosis Date   AIDS (acquired immune deficiency syndrome) ([place])    Hydronephrosis    Hyperkalemia 11/05/2020   Perirectal abscess    Perirectal abscess 09/02/2021   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   Urinary incontinence 09/02/2021   Past Surgical History:  Procedure Laterality Date   APPLICATION OF CRANIAL NAVIGATION Left 10/02/2020   Procedure: APPLICATION OF CRANIAL NAVIGATION;  Surgeon: Newman Pies, MD;  Location: [place];  Service: Neurosurgery;  Laterality: Left;   buttock surgery     CRANIOTOMY Left 10/02/2020   Procedure: LEFT CRANIOTOMY FOR TUMOR BIOPSY/ RESECTION with BrainLab;  Surgeon: Newman Pies, MD;  Location: [place];  Service: Neurosurgery;  Laterality: Left;   IR RADIOLOGIST EVAL & MGMT  10/17/2020   IR RADIOLOGIST EVAL & MGMT  12/02/2020   Patient Active Problem List   Diagnosis Date Noted   Right foot pain 01/05/2022   Skin eruption 09/22/2021   Perirectal abscess 09/02/2021   Blurry vision    Therapeutic drug monitoring 11/05/2020   MRI contraindicated due to metal implant    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   AIDS (acquired immune deficiency syndrome) ([place])    Fibroid uterus    Pelvic pain    Seizure ([place]) 09/29/2020   Iron deficiency anemia 09/29/2020    PCP: Josephine Igo  REFERRING PROVIDER: Josephine Igo  REFERRING DIAG: W58.099, [phone], [phone]  THERAPY DIAG:  Pain in right ankle and joints of right  foot  Pain in left ankle and joints of left foot  Stiffness of right ankle, not elsewhere classified  Rationale for Evaluation and Treatment: Rehabilitation  ONSET DATE: 03/06/22  SUBJECTIVE:   SUBJECTIVE STATEMENT: "Feet hurt a little bit"  PERTINENT HISTORY: AIDS PAIN:  Are you having pain? Yes: NPRS scale: 0/10 Pain location: bilateral feet Pain description: ache, sharp, feels like needles poking me Aggravating factors: walking, standing Relieving factors: nothing  PRECAUTIONS: None  WEIGHT BEARING RESTRICTIONS: No  FALLS:  Has patient fallen in last 6 months? No  LIVING ENVIRONMENT: Lives with: lives with their family Lives in: House/apartment Stairs: Yes: Internal: 15 steps; on right going up Has following equipment at home: None  OCCUPATION: not currently working  PLOF: Independent  PATIENT GOALS: to get rid of pain  NEXT MD VISIT:   OBJECTIVE:   PATIENT SURVEYS:  FOTO 65  COGNITION: Overall cognitive status: Within functional limits for tasks assessed     SENSATION: WFL   PALPATION: Some swelling on R ankle, not TTP  LOWER EXTREMITY ROM:  Active ROM Right eval Left eval  Hip flexion    Hip extension    Hip abduction    Hip adduction    Hip internal rotation    Hip external rotation    Knee flexion    Knee extension    Ankle dorsiflexion 10 w/pain 20  Ankle plantarflexion 25 w/pain 35  Ankle  inversion 20 w/pain 30  Ankle eversion 20 w/pain 25   (Blank rows = not tested)  LOWER EXTREMITY MMT: grossly 4/5 for bilateral ankles   FUNCTIONAL TESTS:  5 times sit to stand: 13.68s Timed up and go (TUG): 10.22s  GAIT: Distance walked: in clinic distances Assistive device utilized: None Level of assistance: Complete Independence Comments: increased pronation of B ankle/foot   TODAY'S TREATMENT:                                                                                                                               DATE: 06/23/22 Bike L3 x6 min  Resisted gait 30lb 4 way x 4 each  Heel raises 2x10 6in step ups from airex x10 each  Side steps and tandem walking on balance bean in // bars S2S on aires    06/18/22 Bike L3 x60mns  Resisted gait 20# 4 way x4  Step ups on 4" on airex  Calf stretch 30s on bar  Calf raises 2x10 Toe raises 2x10  Ankle theraband 4 way red 2x10 Walking on beam   06/16/22 NuStep L3 x 5 min LE only Bilateral 4 way ankle red x12 each  Seated sit fit ankle AROM all directions  S2S on airex 2x10 Alt cone taps on airex      06/01/22-EVAL    PATIENT EDUCATION:  Education details: POC, HEP, and education about getting supportive shoes Person educated: Patient Education method: Explanation Education comprehension: verbalized understanding  HOME EXERCISE PROGRAM: TBD  ASSESSMENT:  CLINICAL IMPRESSION: Pt enters doing well with no pain. Pt reports she does have R ankle pain when she moves her ankle in and out. Some instability present with tandem walking.Cue to prevent posterior leaning with heel raises. Does well with all other interventions today.   OBJECTIVE IMPAIRMENTS: Abnormal gait, decreased balance, decreased mobility, difficulty walking, decreased ROM, decreased strength, and pain.   REHAB POTENTIAL: Good  CLINICAL DECISION MAKING: Stable/uncomplicated  EVALUATION COMPLEXITY: Low  GOALS: Goals reviewed with patient? No  SHORT TERM GOALS: Target date: 07/13/22  Patient will be independent with initial HEP. Goal status: Met  06/23/22  LONG TERM GOALS: Target date: 08/24/22  Patient will be independent with advanced/ongoing HEP to improve outcomes and carryover.  Goal status: INITIAL  2.  Patient will report at least 75% improvement in bilateral ankle/foot pain to improve QOL. Goal status: INITIAL  3.  Patient will demonstrate improved R ankle [facility] normal gait and stair mechanics. Goal status: INITIAL  4.  Patient will be  able to tolerate at least 342ms of walking without pain.  Goal status: INITIAL  5.  Patient will report 733n FOTO to demonstrate improved functional ability. Baseline: 65 Goal status: INITIAL    PLAN:  PT FREQUENCY: 1-2x/week  PT DURATION: 10 weeks  PLANNED INTERVENTIONS: Therapeutic exercises, Therapeutic activity, Neuromuscular re-education, Balance training, Gait training, Patient/Family education, Self Care, Joint mobilization, Stair training,  Cryotherapy, Moist heat, Vasopneumatic device, Ionotophoresis '4mg'$ /ml Dexamethasone, and Manual therapy  PLAN FOR NEXT SESSION: ankle ROM and strengthening, calf raises, ankleTB, resisted gait, establish HEP   Scot Jun, PTA 06/23/2022, 8:46 AM

## 2022-06-24 NOTE — Therapy (Incomplete)
OUTPATIENT PHYSICAL THERAPY LOWER EXTREMITY TRATMENT   Patient Name: Candace Jones MRN: [id number] DOB:July 28, 1980, 42 y.o., female Today's Date: 06/23/2022  END OF SESSION:  PT End of Session - 06/23/22 0845     Visit Number 4    Date for PT Re-Evaluation 08/24/22    PT Start Time 0845    PT Stop Time 0930    PT Time Calculation (min) 45 min    Activity Tolerance Patient tolerated treatment well    Behavior During Therapy [facility] for tasks assessed/performed              Past Medical History:  Diagnosis Date   AIDS (acquired immune deficiency syndrome) ([place])    Hydronephrosis    Hyperkalemia 11/05/2020   Perirectal abscess    Perirectal abscess 09/02/2021   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   Urinary incontinence 09/02/2021   Past Surgical History:  Procedure Laterality Date   APPLICATION OF CRANIAL NAVIGATION Left 10/02/2020   Procedure: APPLICATION OF CRANIAL NAVIGATION;  Surgeon: Newman Pies, MD;  Location: [place];  Service: Neurosurgery;  Laterality: Left;   buttock surgery     CRANIOTOMY Left 10/02/2020   Procedure: LEFT CRANIOTOMY FOR TUMOR BIOPSY/ RESECTION with BrainLab;  Surgeon: Newman Pies, MD;  Location: [place];  Service: Neurosurgery;  Laterality: Left;   IR RADIOLOGIST EVAL & MGMT  10/17/2020   IR RADIOLOGIST EVAL & MGMT  12/02/2020   Patient Active Problem List   Diagnosis Date Noted   Right foot pain 01/05/2022   Skin eruption 09/22/2021   Perirectal abscess 09/02/2021   Blurry vision    Therapeutic drug monitoring 11/05/2020   MRI contraindicated due to metal implant    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   AIDS (acquired immune deficiency syndrome) ([place])    Fibroid uterus    Pelvic pain    Seizure ([place]) 09/29/2020   Iron deficiency anemia 09/29/2020    PCP: Josephine Igo  REFERRING PROVIDER: Josephine Igo  REFERRING DIAG: K02.542, [phone], [phone]  THERAPY DIAG:  Pain in right ankle and joints of right  foot  Pain in left ankle and joints of left foot  Stiffness of right ankle, not elsewhere classified  Rationale for Evaluation and Treatment: Rehabilitation  ONSET DATE: 03/06/22  SUBJECTIVE:   SUBJECTIVE STATEMENT: "Feet hurt a little bit"  PERTINENT HISTORY: AIDS PAIN:  Are you having pain? Yes: NPRS scale: 0/10 Pain location: bilateral feet Pain description: ache, sharp, feels like needles poking me Aggravating factors: walking, standing Relieving factors: nothing  PRECAUTIONS: None  WEIGHT BEARING RESTRICTIONS: No  FALLS:  Has patient fallen in last 6 months? No  LIVING ENVIRONMENT: Lives with: lives with their family Lives in: House/apartment Stairs: Yes: Internal: 15 steps; on right going up Has following equipment at home: None  OCCUPATION: not currently working  PLOF: Independent  PATIENT GOALS: to get rid of pain  NEXT MD VISIT:   OBJECTIVE:   PATIENT SURVEYS:  FOTO 79  COGNITION: Overall cognitive status: Within functional limits for tasks assessed     SENSATION: WFL   PALPATION: Some swelling on R ankle, not TTP  LOWER EXTREMITY ROM:  Active ROM Right eval Left eval  Hip flexion    Hip extension    Hip abduction    Hip adduction    Hip internal rotation    Hip external rotation    Knee flexion    Knee extension    Ankle dorsiflexion 10 w/pain 20  Ankle plantarflexion 25 w/pain 35  Ankle  inversion 20 w/pain 30  Ankle eversion 20 w/pain 25   (Blank rows = not tested)  LOWER EXTREMITY MMT: grossly 4/5 for bilateral ankles   FUNCTIONAL TESTS:  5 times sit to stand: 13.68s Timed up and go (TUG): 10.22s  GAIT: Distance walked: in clinic distances Assistive device utilized: None Level of assistance: Complete Independence Comments: increased pronation of B ankle/foot   TODAY'S TREATMENT:                                                                                                                               DATE: 06/25/22 Elliptical S2S Leg press  Calf raises on leg press  Step ups 6" Lateral step ups  ankleTB   06/23/22 Bike L3 x6 min  Resisted gait 30lb 4 way x 4 each  Heel raises 2x10 6in step ups from airex x10 each  Side steps and tandem walking on balance bean in // bars S2S on aires    06/18/22 Bike L3 x56mns  Resisted gait 20# 4 way x4  Step ups on 4" on airex  Calf stretch 30s on bar  Calf raises 2x10 Toe raises 2x10  Ankle theraband 4 way red 2x10 Walking on beam   06/16/22 NuStep L3 x 5 min LE only Bilateral 4 way ankle red x12 each  Seated sit fit ankle AROM all directions  S2S on airex 2x10 Alt cone taps on airex      06/01/22-EVAL    PATIENT EDUCATION:  Education details: POC, HEP, and education about getting supportive shoes Person educated: Patient Education method: Explanation Education comprehension: verbalized understanding  HOME EXERCISE PROGRAM: TBD  ASSESSMENT:  CLINICAL IMPRESSION: Pt enters doing well with no pain. Pt reports she does have R ankle pain when she moves her ankle in and out. Some instability present with tandem walking.Cue to prevent posterior leaning with heel raises. Does well with all other interventions today.   OBJECTIVE IMPAIRMENTS: Abnormal gait, decreased balance, decreased mobility, difficulty walking, decreased ROM, decreased strength, and pain.   REHAB POTENTIAL: Good  CLINICAL DECISION MAKING: Stable/uncomplicated  EVALUATION COMPLEXITY: Low  GOALS: Goals reviewed with patient? No  SHORT TERM GOALS: Target date: 07/13/22  Patient will be independent with initial HEP. Goal status: Met  06/23/22  LONG TERM GOALS: Target date: 08/24/22  Patient will be independent with advanced/ongoing HEP to improve outcomes and carryover.  Goal status: INITIAL  2.  Patient will report at least 75% improvement in bilateral ankle/foot pain to improve QOL. Goal status: INITIAL  3.  Patient will demonstrate improved R  ankle AROM to [facility] allow for normal gait and stair mechanics. Goal status: INITIAL  4.  Patient will be able to tolerate at least 351ms of walking without pain.  Goal status: INITIAL  5.  Patient will report 7347n FOTO to demonstrate improved functional ability. Baseline: 65 Goal status: INITIAL    PLAN:  PT FREQUENCY: 1-2x/week  PT DURATION: 10  weeks  PLANNED INTERVENTIONS: Therapeutic exercises, Therapeutic activity, Neuromuscular re-education, Balance training, Gait training, Patient/Family education, Self Care, Joint mobilization, Stair training, Cryotherapy, Moist heat, Vasopneumatic device, Ionotophoresis '4mg'$ /ml Dexamethasone, and Manual therapy  PLAN FOR NEXT SESSION: ankle ROM and strengthening, calf raises, ankleTB, resisted gait, establish HEP   Scot Jun, PTA 06/23/2022, 8:46 AM

## 2022-06-25 ENCOUNTER — Ambulatory Visit: Payer: Medicaid Other

## 2022-06-26 DIAGNOSIS — K0889 Other specified disorders of teeth and supporting structures: Secondary | ICD-10-CM | POA: Insufficient documentation

## 2022-06-26 DIAGNOSIS — H1013 Acute atopic conjunctivitis, bilateral: Secondary | ICD-10-CM | POA: Insufficient documentation

## 2022-06-26 NOTE — Assessment & Plan Note (Signed)
Patients present with a protruding and mobile lower incisor without pain or trauma.  Plan:  Referral to dentistry for further evaluation and management.

## 2022-06-26 NOTE — Assessment & Plan Note (Signed)
Patient continues to experience watery, itchy eyes likely related to allergic conjunctivitis, showing partial improvement with Flonase.  Plan:  Continue Flonase with a follow-up to assess response. Rx Olopatadine 0.7% drops and Xyzal tablets. Reassessment in 4 weeks or sooner if symptoms worsen. Consider referral to an ophthalmologist for persistent symptoms despite treatment.

## 2022-06-28 ENCOUNTER — Ambulatory Visit: Payer: Medicaid Other | Admitting: Physical Therapy

## 2022-06-30 ENCOUNTER — Ambulatory Visit: Payer: Medicaid Other | Admitting: Podiatry

## 2022-06-30 ENCOUNTER — Ambulatory Visit: Payer: Medicaid Other | Admitting: Physical Therapy

## 2022-06-30 ENCOUNTER — Encounter: Payer: Self-pay | Admitting: Physical Therapy

## 2022-06-30 DIAGNOSIS — M25571 Pain in right ankle and joints of right foot: Secondary | ICD-10-CM | POA: Diagnosis not present

## 2022-06-30 DIAGNOSIS — M25671 Stiffness of right ankle, not elsewhere classified: Secondary | ICD-10-CM

## 2022-06-30 DIAGNOSIS — M25572 Pain in left ankle and joints of left foot: Secondary | ICD-10-CM

## 2022-06-30 NOTE — Therapy (Signed)
OUTPATIENT PHYSICAL THERAPY LOWER EXTREMITY TRATMENT   Patient Name: Candace Jones MRN: [id number] DOB:1981/01/15, 42 y.o., female Today's Date: 06/30/2022  END OF SESSION:  PT End of Session - 06/30/22 0840     Visit Number 5    Date for PT Re-Evaluation 08/24/22    PT Start Time 0840    PT Stop Time 0920    PT Time Calculation (min) 40 min    Activity Tolerance Patient tolerated treatment well    Behavior During Therapy [facility] for tasks assessed/performed              Past Medical History:  Diagnosis Date   AIDS (acquired immune deficiency syndrome) ([place])    Hydronephrosis    Hyperkalemia 11/05/2020   Perirectal abscess    Perirectal abscess 09/02/2021   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   Urinary incontinence 09/02/2021   Past Surgical History:  Procedure Laterality Date   APPLICATION OF CRANIAL NAVIGATION Left 10/02/2020   Procedure: APPLICATION OF CRANIAL NAVIGATION;  Surgeon: Newman Pies, MD;  Location: [place];  Service: Neurosurgery;  Laterality: Left;   buttock surgery     CRANIOTOMY Left 10/02/2020   Procedure: LEFT CRANIOTOMY FOR TUMOR BIOPSY/ RESECTION with BrainLab;  Surgeon: Newman Pies, MD;  Location: [place];  Service: Neurosurgery;  Laterality: Left;   IR RADIOLOGIST EVAL & MGMT  10/17/2020   IR RADIOLOGIST EVAL & MGMT  12/02/2020   Patient Active Problem List   Diagnosis Date Noted   Allergic conjunctivitis of both eyes 06/26/2022   Tooth loose 06/26/2022   Right foot pain 01/05/2022   Skin eruption 09/22/2021   Perirectal abscess 09/02/2021   Blurry vision    Therapeutic drug monitoring 11/05/2020   MRI contraindicated due to metal implant    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   AIDS (acquired immune deficiency syndrome) ([place])    Fibroid uterus    Pelvic pain    Seizure ([place]) 09/29/2020   Iron deficiency anemia 09/29/2020    PCP: Josephine Igo  REFERRING PROVIDER: Josephine Igo  REFERRING DIAG: I96.789,  [phone], [phone]  THERAPY DIAG:  Pain in right ankle and joints of right foot  Pain in left ankle and joints of left foot  Stiffness of right ankle, not elsewhere classified  Rationale for Evaluation and Treatment: Rehabilitation  ONSET DATE: 03/06/22  SUBJECTIVE:   SUBJECTIVE STATEMENT: Ankle hurt a little bit on the inside   PERTINENT HISTORY: AIDS PAIN:  Are you having pain? Yes: NPRS scale: 5/10 Pain location: bilateral feet Pain description: ache, sharp, feels like needles poking me Aggravating factors: walking, standing Relieving factors: nothing  PRECAUTIONS: None  WEIGHT BEARING RESTRICTIONS: No  FALLS:  Has patient fallen in last 6 months? No  LIVING ENVIRONMENT: Lives with: lives with their family Lives in: House/apartment Stairs: Yes: Internal: 15 steps; on right going up Has following equipment at home: None  OCCUPATION: not currently working  PLOF: Independent  PATIENT GOALS: to get rid of pain  NEXT MD VISIT:   OBJECTIVE:   PATIENT SURVEYS:  FOTO 63  COGNITION: Overall cognitive status: Within functional limits for tasks assessed     SENSATION: WFL   PALPATION: Some swelling on R ankle, not TTP  LOWER EXTREMITY ROM:  Active ROM Right eval Left eval  Hip flexion    Hip extension    Hip abduction    Hip adduction    Hip internal rotation    Hip external rotation    Knee flexion    Knee  extension    Ankle dorsiflexion 10 w/pain 20  Ankle plantarflexion 25 w/pain 35  Ankle inversion 20 w/pain 30  Ankle eversion 20 w/pain 25   (Blank rows = not tested)  LOWER EXTREMITY MMT: grossly 4/5 for bilateral ankles   FUNCTIONAL TESTS:  5 times sit to stand: 13.68s Timed up and go (TUG): 10.22s  GAIT: Distance walked: in clinic distances Assistive device utilized: None Level of assistance: Complete Independence Comments: increased pronation of B ankle/foot   TODAY'S TREATMENT:                                                                                                                               DATE: 06/30/22 Bike L3 x 6 min S2S LE on airex 2x10   6in step ups forward x10 each Heel raises 2x10 Leg press 20lb 2x10 Hamstring Curls 25lb x10, 20lb x10  Leg Ext 5lb 2x10 Lateral step ups x10   06/23/22 Bike L3 x6 min  Resisted gait 30lb 4 way x 4 each  Heel raises 2x10 6in step ups from airex x10 each  Side steps and tandem walking on balance bean in // bars S2S on aires    06/18/22 Bike L3 x35mns  Resisted gait 20# 4 way x4  Step ups on 4" on airex  Calf stretch 30s on bar  Calf raises 2x10 Toe raises 2x10  Ankle theraband 4 way red 2x10 Walking on beam   06/16/22 NuStep L3 x 5 min LE only Bilateral 4 way ankle red x12 each  Seated sit fit ankle AROM all directions  S2S on airex 2x10 Alt cone taps on airex      06/01/22-EVAL    PATIENT EDUCATION:  Education details: POC, HEP, and education about getting supportive shoes Person educated: Patient Education method: Explanation Education comprehension: verbalized understanding  HOME EXERCISE PROGRAM: TBD  ASSESSMENT:  CLINICAL IMPRESSION: Pt enters doing well with no pain. Pt reports she does have R ankle pain with palpation. Muscle burning and fatigue reported with seated leg curls and ext. Cue to prevent posterior leaning with heel raises. Does well with all other interventions today. Fatigue reported post session.  OBJECTIVE IMPAIRMENTS: Abnormal gait, decreased balance, decreased mobility, difficulty walking, decreased ROM, decreased strength, and pain.   REHAB POTENTIAL: Good  CLINICAL DECISION MAKING: Stable/uncomplicated  EVALUATION COMPLEXITY: Low  GOALS: Goals reviewed with patient? No  SHORT TERM GOALS: Target date: 07/13/22  Patient will be independent with initial HEP. Goal status: Met  06/23/22  LONG TERM GOALS: Target date: 08/24/22  Patient will be independent with advanced/ongoing HEP to improve outcomes  and carryover.  Goal status: INITIAL  2.  Patient will report at least 75% improvement in bilateral ankle/foot pain to improve QOL. Goal status: INITIAL  3.  Patient will demonstrate improved R ankle AROM to [facility] for normal gait and stair mechanics. Goal status: INITIAL  4.  Patient will be able to tolerate at  least 47mns of walking without pain.  Goal status: INITIAL  5.  Patient will report 774on FOTO to demonstrate improved functional ability. Baseline: 65 Goal status: INITIAL    PLAN:  PT FREQUENCY: 1-2x/week  PT DURATION: 10 weeks  PLANNED INTERVENTIONS: Therapeutic exercises, Therapeutic activity, Neuromuscular re-education, Balance training, Gait training, Patient/Family education, Self Care, Joint mobilization, Stair training, Cryotherapy, Moist heat, Vasopneumatic device, Ionotophoresis '4mg'$ /ml Dexamethasone, and Manual therapy  PLAN FOR NEXT SESSION: ankle ROM and strengthening, calf raises, ankleTB, resisted gait, establish HEP   RScot Jun PTA 06/30/2022, 8:41 AM

## 2022-07-01 ENCOUNTER — Ambulatory Visit: Payer: Medicaid Other | Admitting: Physical Therapy

## 2022-07-01 NOTE — Therapy (Incomplete)
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   Urinary incontinence 09/02/2021   Past Surgical History:  Procedure Laterality Date   APPLICATION OF CRANIAL NAVIGATION Left 10/02/2020   Procedure: APPLICATION OF CRANIAL NAVIGATION;  Surgeon: Newman Pies, MD;  Location: [facility];  Service: Neurosurgery;  Laterality: Left;   buttock surgery     CRANIOTOMY Left 10/02/2020   Procedure: LEFT CRANIOTOMY FOR TUMOR BIOPSY/ RESECTION with BrainLab;  Surgeon: Newman Pies, MD;  Location: [facility];  Service: Neurosurgery;  Laterality: Left;   IR RADIOLOGIST EVAL & MGMT  10/17/2020   IR RADIOLOGIST EVAL & MGMT  12/02/2020   Patient Active Problem List   Diagnosis Date Noted   Allergic conjunctivitis of both eyes 06/26/2022   Tooth loose 06/26/2022   Right foot pain 01/05/2022   Skin eruption 09/22/2021   Perirectal abscess 09/02/2021   Blurry vision    Therapeutic drug monitoring 11/05/2020   MRI contraindicated due to metal implant    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  Rationale for Evaluation and Treatment: Rehabilitation  ONSET DATE: 03/06/22  SUBJECTIVE:   SUBJECTIVE STATEMENT: Ankle hurt a little bit on the inside   PERTINENT HISTORY: AIDS PAIN:  Are you having pain? Yes: NPRS scale: 5/10 Pain location:  bilateral feet Pain description: ache, sharp, feels like needles poking me Aggravating factors: walking, standing Relieving factors: nothing  PRECAUTIONS: None  WEIGHT BEARING RESTRICTIONS: No  FALLS:  Has patient fallen in last 6 months? No  LIVING ENVIRONMENT: Lives with: lives with their family Lives in: House/apartment Stairs: Yes: Internal: 15 steps; on right going up Has following equipment at home: None  OCCUPATION: not currently working  PLOF: Independent  PATIENT GOALS: to get rid of pain  NEXT MD VISIT:   OBJECTIVE:   PATIENT SURVEYS:  FOTO 65  COGNITION: Overall cognitive status: Within functional limits for tasks assessed     SENSATION: WFL   PALPATION: Some swelling on R ankle, not TTP  LOWER EXTREMITY ROM:  Active ROM Right eval Left eval  Hip flexion    Hip extension    Hip abduction    Hip adduction    Hip internal rotation    Hip external rotation    Knee flexion    Knee extension    Ankle dorsiflexion 10 w/pain 20  Ankle plantarflexion 25 w/pain 35  Ankle inversion 20 w/pain 30  Ankle eversion 20 w/pain 25   (Blank rows = not tested)  LOWER EXTREMITY MMT: grossly 4/5 for bilateral ankles   FUNCTIONAL TESTS:  5 times sit to stand: 13.68s Timed up and go (TUG): 10.22s  GAIT: Distance walked: in clinic distances Assistive device utilized: None Level of assistance: Complete Independence Comments: increased pronation of B ankle/foot   TODAY'S TREATMENT:  DATE: 07/02/22 Treadmill  Step ups and lateral step ups Heel raises Leg press Walking on beam SL catch   06/30/22 Bike L3 x 6 min S2S LE on airex 2x10   6in step ups forward x10 each Heel raises 2x10 Leg press 20lb 2x10 Hamstring Curls 25lb x10, 20lb x10  Leg Ext 5lb 2x10 Lateral step ups x10   06/23/22 Bike L3 x6 min  Resisted gait 30lb 4  way x 4 each  Heel raises 2x10 6in step ups from airex x10 each  Side steps and tandem walking on balance bean in // bars S2S on aires    06/18/22 Bike L3 x46mns  Resisted gait 20# 4 way x4  Step ups on 4" on airex  Calf stretch 30s on bar  Calf raises 2x10 Toe raises 2x10  Ankle theraband 4 way red 2x10 Walking on beam   06/16/22 NuStep L3 x 5 min LE only Bilateral 4 way ankle red x12 each  Seated sit fit ankle AROM all directions  S2S on airex 2x10 Alt cone taps on airex      06/01/22-EVAL    PATIENT EDUCATION:  Education details: POC, HEP, and education about getting supportive shoes Person educated: Patient Education method: Explanation Education comprehension: verbalized understanding  HOME EXERCISE PROGRAM: TBD  ASSESSMENT:  CLINICAL IMPRESSION: Pt enters doing well with no pain. Pt reports she does have R ankle pain with palpation. Muscle burning and fatigue reported with seated leg curls and ext. Cue to prevent posterior leaning with heel raises. Does well with all other interventions today. Fatigue reported post session.  OBJECTIVE IMPAIRMENTS: Abnormal gait, decreased balance, decreased mobility, difficulty walking, decreased ROM, decreased strength, and pain.   REHAB POTENTIAL: Good  CLINICAL DECISION MAKING: Stable/uncomplicated  EVALUATION COMPLEXITY: Low  GOALS: Goals reviewed with patient? No  SHORT TERM GOALS: Target date: 07/13/22  Patient will be independent with initial HEP. Goal status: Met  06/23/22  LONG TERM GOALS: Target date: 08/24/22  Patient will be independent with advanced/ongoing HEP to improve outcomes and carryover.  Goal status: INITIAL  2.  Patient will report at least 75% improvement in bilateral ankle/foot pain to improve QOL. Goal status: INITIAL  3.  Patient will demonstrate improved R ankle [facility] to [facility] for normal gait and stair mechanics. Goal status: INITIAL  4.  Patient will be able to tolerate at least  32ms of walking without pain.  Goal status: INITIAL  5.  Patient will report 7358n FOTO to demonstrate improved functional ability. Baseline: 65 Goal status: INITIAL    PLAN:  PT FREQUENCY: 1-2x/week  PT DURATION: 10 weeks  PLANNED INTERVENTIONS: Therapeutic exercises, Therapeutic activity, Neuromuscular re-education, Balance training, Gait training, Patient/Family education, Self Care, Joint mobilization, Stair training, Cryotherapy, Moist heat, Vasopneumatic device, Ionotophoresis '4mg'$ /ml Dexamethasone, and Manual therapy  PLAN FOR NEXT SESSION: ankle ROM and strengthening, calf raises, ankleTB, resisted gait, establish HEP   MoAndris BaumannPT 07/01/2022, 4:33 PM

## 2022-07-02 ENCOUNTER — Ambulatory Visit: Payer: Medicaid Other

## 2022-07-07 ENCOUNTER — Other Ambulatory Visit: Payer: Self-pay | Admitting: Infectious Diseases

## 2022-07-07 NOTE — Telephone Encounter (Signed)
Okay to refill? 

## 2022-07-27 ENCOUNTER — Ambulatory Visit: Payer: Medicaid Other | Attending: Family Medicine

## 2022-07-27 DIAGNOSIS — M25571 Pain in right ankle and joints of right foot: Secondary | ICD-10-CM | POA: Insufficient documentation

## 2022-07-27 DIAGNOSIS — M25572 Pain in left ankle and joints of left foot: Secondary | ICD-10-CM | POA: Insufficient documentation

## 2022-07-27 DIAGNOSIS — M25671 Stiffness of right ankle, not elsewhere classified: Secondary | ICD-10-CM | POA: Insufficient documentation

## 2022-07-27 DIAGNOSIS — R2689 Other abnormalities of gait and mobility: Secondary | ICD-10-CM | POA: Insufficient documentation

## 2022-07-27 NOTE — Therapy (Signed)
OUTPATIENT PHYSICAL THERAPY LOWER EXTREMITY TRATMENT   Patient Name: Candace Jones MRN: [id number] DOB:21-Nov-1980, 42 y.o., female Today's Date: 07/27/2022  END OF SESSION:  PT End of Session - 07/27/22 1612     Visit Number 6    Date for PT Re-Evaluation 08/24/22    PT Start Time 1630    PT Stop Time 1715    PT Time Calculation (min) 45 min    Activity Tolerance Patient tolerated treatment well    Behavior During Therapy [facility] for tasks assessed/performed               Past Medical History:  Diagnosis Date   AIDS (acquired immune deficiency syndrome) ([place])    Hydronephrosis    Hyperkalemia 11/05/2020   Perirectal abscess    Perirectal abscess 09/02/2021   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   Urinary incontinence 09/02/2021   Past Surgical History:  Procedure Laterality Date   APPLICATION OF CRANIAL NAVIGATION Left 10/02/2020   Procedure: APPLICATION OF CRANIAL NAVIGATION;  Surgeon: Newman Pies, MD;  Location: [facility];  Service: Neurosurgery;  Laterality: Left;   buttock surgery     CRANIOTOMY Left 10/02/2020   Procedure: LEFT CRANIOTOMY FOR TUMOR BIOPSY/ RESECTION with BrainLab;  Surgeon: Newman Pies, MD;  Location: [facility];  Service: Neurosurgery;  Laterality: Left;   IR RADIOLOGIST EVAL & MGMT  10/17/2020   IR RADIOLOGIST EVAL & MGMT  12/02/2020   Patient Active Problem List   Diagnosis Date Noted   Allergic conjunctivitis of both eyes 06/26/2022   Tooth loose 06/26/2022   Right foot pain 01/05/2022   Skin eruption 09/22/2021   Perirectal abscess 09/02/2021   Blurry vision    Therapeutic drug monitoring 11/05/2020   MRI contraindicated due to metal implant    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   AIDS (acquired immune deficiency syndrome) ([place])    Fibroid uterus    Pelvic pain    Seizure ([place]) 09/29/2020   Iron deficiency anemia 09/29/2020    PCP: Josephine Igo  REFERRING PROVIDER: Josephine Igo  REFERRING DIAG: [id number],  [phone], [phone]  THERAPY DIAG:  Pain in right ankle and joints of right foot  Pain in left ankle and joints of left foot  Stiffness of right ankle, not elsewhere classified  Other abnormalities of gait and mobility  Rationale for Evaluation and Treatment: Rehabilitation  ONSET DATE: 03/06/22  SUBJECTIVE:   SUBJECTIVE STATEMENT: My feet hurt, it feels like cramping all the way up my leg that has been going on since Saturday.   PERTINENT HISTORY: AIDS  PAIN:  Are you having pain? Yes: NPRS scale: 9/10 Pain location: bilateral feet Pain description: ache, sharp, feels like needles poking me Aggravating factors: walking, standing Relieving factors: nothing  PRECAUTIONS: None  WEIGHT BEARING RESTRICTIONS: No  FALLS:  Has patient fallen in last 6 months? No  LIVING ENVIRONMENT: Lives with: lives with their family Lives in: House/apartment Stairs: Yes: Internal: 15 steps; on right going up Has following equipment at home: None  OCCUPATION: not currently working  PLOF: Independent  PATIENT GOALS: to get rid of pain  NEXT MD VISIT:   OBJECTIVE:   PATIENT SURVEYS:  FOTO 51  COGNITION: Overall cognitive status: Within functional limits for tasks assessed     SENSATION: WFL   PALPATION: Some swelling on R ankle, not TTP  LOWER EXTREMITY ROM:  Active ROM Right eval Left eval  Hip flexion    Hip extension    Hip abduction    Hip adduction  Hip internal rotation    Hip external rotation    Knee flexion    Knee extension    Ankle dorsiflexion 10 w/pain 20  Ankle plantarflexion 25 w/pain 35  Ankle inversion 20 w/pain 30  Ankle eversion 20 w/pain 25   (Blank rows = not tested)  LOWER EXTREMITY MMT: grossly 4/5 for bilateral ankles   FUNCTIONAL TESTS:  5 times sit to stand: 13.68s Timed up and go (TUG): 10.22s  GAIT: Distance walked: in clinic distances Assistive device utilized: None Level of assistance: Complete Independence Comments:  increased pronation of B ankle/foot   TODAY'S TREATMENT:                                                                                                                              DATE: 07/27/22 Elliptical L1 x2 mins each way   Step ups and lateral step ups Calf stretch 30s  Heel raises 2x12  Leg press 20# 2x10 Calf raises on leg press 20# 2x10  SLS 10s on R, 15s on L SL catch with small green ball  SL on airex 6s on R, 12s on L  Walking on beam  06/30/22 Bike L3 x 6 min S2S LE on airex 2x10   6in step ups forward x10 each Heel raises 2x10 Leg press 20lb 2x10 Hamstring Curls 25lb x10, 20lb x10  Leg Ext 5lb 2x10 Lateral step ups x10   06/23/22 Bike L3 x6 min  Resisted gait 30lb 4 way x 4 each  Heel raises 2x10 6in step ups from airex x10 each  Side steps and tandem walking on balance bean in // bars S2S on aires   06/18/22 Bike L3 x2mns  Resisted gait 20# 4 way x4  Step ups on 4" on airex  Calf stretch 30s on bar  Calf raises 2x10 Toe raises 2x10  Ankle theraband 4 way red 2x10 Walking on beam   06/16/22 NuStep L3 x 5 min LE only Bilateral 4 way ankle red x12 each  Seated sit fit ankle AROM all directions  S2S on airex 2x10 Alt cone taps on airex      06/01/22-EVAL    PATIENT EDUCATION:  Education details: POC, HEP, and education about getting supportive shoes Person educated: Patient Education method: Explanation Education comprehension: verbalized understanding  HOME EXERCISE PROGRAM: TBD  ASSESSMENT:  CLINICAL IMPRESSION: Pt enters enters with high pain levels. She has pain on the elliptical but states she wants to keep going and states going backwards is easier than forwards. Worked on some functional strengthening and ankle/foot strengthening. States her pain is much better at the end of the visit, rates it about a 6 or 7.   OBJECTIVE IMPAIRMENTS: Abnormal gait, decreased balance, decreased mobility, difficulty walking, decreased ROM, decreased  strength, and pain.   REHAB POTENTIAL: Good  CLINICAL DECISION MAKING: Stable/uncomplicated  EVALUATION COMPLEXITY: Low  GOALS: Goals reviewed with patient? No  SHORT TERM GOALS: Target date: 07/13/22  Patient will be independent with initial HEP. Goal status: Met  06/23/22  LONG TERM GOALS: Target date: 08/24/22  Patient will be independent with advanced/ongoing HEP to improve outcomes and carryover.  Goal status: INITIAL  2.  Patient will report at least 75% improvement in bilateral ankle/foot pain to improve QOL. Goal status: INITIAL  3.  Patient will demonstrate improved R ankle AROM to [facility] to allow for normal gait and stair mechanics. Goal status: INITIAL  4.  Patient will be able to tolerate at least 33mns of walking without pain.  Goal status: INITIAL  5.  Patient will report 771on FOTO to demonstrate improved functional ability. Baseline: 65, 71-2/13/24 Goal status: IN PROGRESS    PLAN:  PT FREQUENCY: 1-2x/week  PT DURATION: 10 weeks  PLANNED INTERVENTIONS: Therapeutic exercises, Therapeutic activity, Neuromuscular re-education, Balance training, Gait training, Patient/Family education, Self Care, Joint mobilization, Stair training, Cryotherapy, Moist heat, Vasopneumatic device, Ionotophoresis 469mml Dexamethasone, and Manual therapy  PLAN FOR NEXT SESSION: ankle ROM and strengthening, calf raises, ankleTB, resisted gait, establish HEP   MoTRW AutomotivePT 07/27/2022, 5:13 PM

## 2022-08-10 ENCOUNTER — Ambulatory Visit: Payer: Medicaid Other | Admitting: Physical Therapy

## 2022-09-30 ENCOUNTER — Telehealth: Payer: Self-pay | Admitting: Infectious Diseases

## 2022-09-30 NOTE — Telephone Encounter (Signed)
Pt came in hoping to speak with Judeth Cornfield regarding itching all over her body. She is scheduled for Stephanie's next available but would like to speak with her or a nurse concerning the matter. She can be reached at [phone].

## 2022-10-11 ENCOUNTER — Other Ambulatory Visit: Payer: Self-pay

## 2022-10-11 ENCOUNTER — Encounter (HOSPITAL_COMMUNITY): Payer: Self-pay

## 2022-10-11 ENCOUNTER — Emergency Department (HOSPITAL_COMMUNITY)
Admission: EM | Admit: 2022-10-11 | Discharge: 2022-10-11 | Disposition: A | Discharge status: Home / Self Care | Payer: Medicaid Other | Attending: Student | Admitting: Student

## 2022-10-11 DIAGNOSIS — D509 Iron deficiency anemia, unspecified: Secondary | ICD-10-CM

## 2022-10-11 DIAGNOSIS — J029 Acute pharyngitis, unspecified: Secondary | ICD-10-CM | POA: Diagnosis present

## 2022-10-11 DIAGNOSIS — L299 Pruritus, unspecified: Secondary | ICD-10-CM

## 2022-10-11 DIAGNOSIS — R5381 Other malaise: Secondary | ICD-10-CM | POA: Diagnosis not present

## 2022-10-11 LAB — CBC WITH DIFFERENTIAL/PLATELET
Abs Immature Granulocytes: 0.01 10*3/uL (ref 0.00–0.07)
Basophils Absolute: 0 10*3/uL (ref 0.0–0.1)
Basophils Relative: 1 %
Eosinophils Absolute: 0.1 10*3/uL (ref 0.0–0.5)
Eosinophils Relative: 1 %
HCT: 23.2 % — ABNORMAL LOW (ref 36.0–46.0)
Hemoglobin: 6.6 g/dL — CL (ref 12.0–15.0)
Immature Granulocytes: 0 %
Lymphocytes Relative: 57 %
Lymphs Abs: 2.4 10*3/uL (ref 0.7–4.0)
MCH: 17.8 pg — ABNORMAL LOW (ref 26.0–34.0)
MCHC: 28.4 g/dL — ABNORMAL LOW (ref 30.0–36.0)
MCV: 62.5 fL — ABNORMAL LOW (ref 80.0–100.0)
Monocytes Absolute: 0.3 10*3/uL (ref 0.1–1.0)
Monocytes Relative: 7 %
Neutro Abs: 1.5 10*3/uL — ABNORMAL LOW (ref 1.7–7.7)
Neutrophils Relative %: 34 %
Platelets: 122 10*3/uL — ABNORMAL LOW (ref 150–400)
RBC: 3.71 MIL/uL — ABNORMAL LOW (ref 3.87–5.11)
RDW: 21.8 % — ABNORMAL HIGH (ref 11.5–15.5)
Smear Review: DECREASED
WBC: 4.3 10*3/uL (ref 4.0–10.5)
nRBC: 0 % (ref 0.0–0.2)

## 2022-10-11 LAB — COMPREHENSIVE METABOLIC PANEL
ALT: 13 U/L (ref 0–44)
AST: 24 U/L (ref 15–41)
Albumin: 3.2 g/dL — ABNORMAL LOW (ref 3.5–5.0)
Alkaline Phosphatase: 50 U/L (ref 38–126)
Anion gap: 10 (ref 5–15)
BUN: 9 mg/dL (ref 6–20)
CO2: 22 mmol/L (ref 22–32)
Calcium: 8.2 mg/dL — ABNORMAL LOW (ref 8.9–10.3)
Chloride: 104 mmol/L (ref 98–111)
Creatinine, Ser: 0.64 mg/dL (ref 0.44–1.00)
GFR, Estimated: >60 mL/min (ref >60)
Glucose, Bld: 93 mg/dL (ref 70–99)
Potassium: 3.9 mmol/L (ref 3.5–5.1)
Sodium: 136 mmol/L (ref 135–145)
Total Bilirubin: 0.5 mg/dL (ref 0.3–1.2)
Total Protein: 7.9 g/dL (ref 6.5–8.1)

## 2022-10-11 LAB — RETICULOCYTES
Immature Retic Fract: 19.3 % — ABNORMAL HIGH (ref 2.3–15.9)
RBC.: 3.81 MIL/uL — ABNORMAL LOW (ref 3.87–5.11)
Retic Count, Absolute: 26.7 10*3/uL (ref 19.0–186.0)
Retic Ct Pct: 0.7 % (ref 0.4–3.1)

## 2022-10-11 LAB — IRON AND TIBC
Iron: 7 ug/dL — ABNORMAL LOW (ref 28–170)
Saturation Ratios: 2 % — ABNORMAL LOW (ref 10.4–31.8)
TIBC: 456 ug/dL — ABNORMAL HIGH (ref 250–450)
UIBC: 449 ug/dL

## 2022-10-11 LAB — FERRITIN: Ferritin: 5 ng/mL — ABNORMAL LOW (ref 11–307)

## 2022-10-11 LAB — PREPARE RBC (CROSSMATCH)

## 2022-10-11 LAB — FOLATE: Folate: 12.1 ng/mL (ref >5.9)

## 2022-10-11 LAB — GROUP A STREP BY PCR: Group A Strep by PCR: NOT DETECTED

## 2022-10-11 LAB — MONONUCLEOSIS SCREEN: Mono Screen: NEGATIVE

## 2022-10-11 LAB — VITAMIN B12: Vitamin B-12: 1474 pg/mL — ABNORMAL HIGH (ref 180–914)

## 2022-10-11 MED ORDER — HYDROXYZINE HCL 25 MG PO TABS: 25.0000 mg | ORAL_TABLET | Freq: Four times a day (QID) | ORAL | 0 refills | Status: DC

## 2022-10-11 MED ORDER — SODIUM CHLORIDE 0.9% IV SOLUTION: Freq: Once | INTRAVENOUS | Status: AC

## 2022-10-11 MED ORDER — FERROUS SULFATE 325 (65 FE) MG PO TABS: 325.0000 mg | ORAL_TABLET | Freq: Every day | ORAL | 0 refills | Status: DC

## 2022-10-11 NOTE — ED Provider Notes (Signed)
I received this patient handoff from previous PA [employer].  Please see his note for original history and workup thus far.  In short patient is a 42 year old female with a past medical history of AIDS followed by infectious disease who presented today with concern for viral symptoms.  Lab work was performed and her hemoglobin was found to be 6.6.  She also has very low iron.  At this time she is receiving a unit of blood.  Likely can be discharged after with iron supplementation   Physical Exam  BP (!) 140/97   Pulse 61   Temp 98.5 F (36.9 C) (Oral)   Resp 18   SpO2 97%   Physical Exam  Procedures  Procedures  ED Course / MDM    Medical Decision Making Amount and/or Complexity of Data Reviewed Labs: ordered.  Risk OTC drugs. Prescription drug management.   4/24: blood transfusion complete without concerns.  Discharged       Logan [place], [place] A, PA-C 10/11/22 1625    Charlynne Pander, MD 10/11/22 [phone]

## 2022-10-11 NOTE — ED Notes (Signed)
Pt given sandwich bag and drink per MD ok

## 2022-10-11 NOTE — ED Triage Notes (Signed)
Pt arrived POV from home c/o a sore throat, a rash and a headache x2 weeks.

## 2022-10-11 NOTE — ED Provider Notes (Signed)
[place] EMERGENCY DEPARTMENT AT [facility] Provider Note   CSN: [id number] Arrival date & time: 10/11/22  0800     History  Chief Complaint  Patient presents with   Headache   Sore Throat    Porfiria Candace Jones is a 42 y.o. female.  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  She reports this has been going on for 2 weeks.  No associated persistent fevers, chills, ear pain, runny nose or cough.  She is able to swallow.  The rash has been going on for greater than 6 months.  Her doctors have tried some medication changes and given her triamcinolone ointment.  Patient states that this has not helped.  She does not currently endorse antihistamine use.  She states that is causing her difficulty sleeping.  In fact, this seems to be her most pressing complaint today.       Home Medications Prior to Admission medications   Medication Sig Start Date End Date Taking? Authorizing Provider  atovaquone (MEPRON) 750 MG/5ML suspension Take 5 mLs (750 mg total) by mouth 2 (two) times daily with a meal. Patient not taking: Reported on 06/21/2022 05/27/22   Blanchard Kelch, NP  diclofenac Sodium (VOLTAREN) 1 % GEL APPLY 4 GRAMS TOPICALLY TO THE AFFECTED AREA FOUR TIMES DAILY Patient not taking: Reported on 06/21/2022 04/19/22   Blanchard Kelch, NP  dolutegravir-lamiVUDine (DOVATO) 50-300 MG tablet Take 1 tablet by mouth daily. 05/27/22   Blanchard Kelch, NP  fluticasone (FLONASE) 50 MCG/ACT nasal spray Place 2 sprays into both nostrils daily. 06/21/22   Garnette Gunner, MD  levETIRAcetam (KEPPRA) 750 MG tablet Take 1 tablet (750 mg total) by mouth 2 (two) times daily. Patient not taking:  Reported on 06/21/2022 09/22/21   Blanchard Kelch, NP  levocetirizine (XYZAL ALLERGY 24HR) 5 MG tablet Take 1 tablet (5 mg total) by mouth every evening. 06/21/22 07/21/22  Garnette Gunner, MD  naproxen (NAPROSYN) 500 MG tablet Take 1 tablet (500 mg total) by mouth 2 (two) times daily. Patient not taking: Reported on 06/21/2022 03/07/22   Sabas Sous, MD  Olopatadine HCl (PATADAY) 0.7 % SOLN Use 1 drop in each eye daily 06/21/22   Garnette Gunner, MD  triamcinolone 0.1% oint-Cerave equivalent lotion 1:1 mixture Apply topically 3 (three) times daily as needed. Patient not taking: Reported on 06/21/2022 05/27/22   Blanchard Kelch, NP  triamcinolone ointment (KENALOG) 0.5 % APPLY TOPICALLY TO THE AFFECTED AREA TWICE DAILY 07/07/22   Blanchard Kelch, NP  Vitamin D, Ergocalciferol, (DRISDOL) 1.25 MG (50000 UNIT) CAPS capsule Take 1 capsule (50,000 Units total) by mouth every 7 (seven) days. Patient not taking: Reported on 06/21/2022 10/15/21   Windell Norfolk, MD      Allergies    Tenofovir    Review of Systems   Review of Systems  Physical Exam Updated Vital Signs BP 109/78 (BP Location: Right Arm)   Pulse 72   Temp 97.7 F (36.5 C) (Oral)   Resp 18   SpO2 100%   Physical Exam Vitals and nursing note reviewed.  Constitutional:      General: She is not in acute distress.    Appearance: She is well-developed.  HENT:     Head: Normocephalic and atraumatic.     Jaw: No trismus.     Right Ear: Tympanic membrane, ear canal and external ear normal.     Left Ear: Tympanic membrane, ear canal and external ear normal.     Nose: Nose normal. No mucosal edema or rhinorrhea.     Mouth/Throat:     Mouth: Mucous membranes are moist. Mucous membranes are not dry. No oral lesions.     Pharynx: Uvula midline. Posterior oropharyngeal erythema present. No oropharyngeal exudate or uvula swelling.     Tonsils: No tonsillar abscesses.     Comments: No thrush noted, mild posterior oropharyngeal  erythema Eyes:     General:        Right eye: No discharge.        Left eye: No discharge.     Conjunctiva/sclera: Conjunctivae normal.  Cardiovascular:     Rate and Rhythm: Normal rate and regular rhythm.     Heart sounds: Normal heart sounds. No murmur heard. Pulmonary:     Effort: Pulmonary effort is normal. No respiratory distress.     Breath sounds: Normal breath sounds. No wheezing, rhonchi or rales.  Abdominal:     Palpations: Abdomen is soft.     Tenderness: There is no abdominal tenderness. There is no guarding or rebound.  Musculoskeletal:     Cervical back: Normal range of motion and neck supple.     Right lower leg: No edema.     Left lower leg: No edema.  Lymphadenopathy:     Cervical: No cervical adenopathy.  Skin:    General: Skin is warm and dry.     Findings: No rash.     Comments: Patient with scattered excoriations and hyperpigmented areas over her extremities, no signs of cellulitis or abscess.  Neurological:     General: No focal deficit present.     Mental Status: She is alert. Mental status is at baseline.     Motor: No weakness.  Psychiatric:        Mood and Affect: Mood normal.     ED Results / Procedures / Treatments   Labs (all labs ordered are listed, but only abnormal results are displayed) Labs Reviewed  CBC WITH DIFFERENTIAL/PLATELET - Abnormal; Notable for the following components:      Result Value   RBC 3.71 (*)    Hemoglobin 6.6 (*)    HCT 23.2 (*)    MCV 62.5 (*)    MCH 17.8 (*)    MCHC 28.4 (*)    RDW 21.8 (*)    Platelets 122 (*)    Neutro Abs 1.5 (*)    All other components within normal limits  COMPREHENSIVE METABOLIC PANEL - Abnormal; Notable for the following components:   Calcium 8.2 (*)    Albumin 3.2 (*)    All other components within normal limits  VITAMIN B12 - Abnormal; Notable for the following components:   Vitamin B-12 1,474 (*)    All other components within normal limits  IRON AND TIBC - Abnormal; Notable  for the following components:   Iron 7 (*)    TIBC 456 (*)    Saturation Ratios 2 (*)    All other components within normal limits  FERRITIN - Abnormal; Notable for the following components:   Ferritin 5 (*)    All other components within normal limits  RETICULOCYTES - Abnormal; Notable for the following components:   RBC. 3.81 (*)  Immature Retic Fract 19.3 (*)    All other components within normal limits  GROUP A STREP BY PCR  MONONUCLEOSIS SCREEN  FOLATE  PATHOLOGIST SMEAR REVIEW  TYPE AND SCREEN  PREPARE RBC (CROSSMATCH)    EKG None  Radiology No results found.  Procedures Procedures    Medications Ordered in ED Medications  0.9 %  sodium chloride infusion (Manually program via Guardrails IV Fluids) (has no administration in time range)    ED Course/ Medical Decision Making/ A&P    Patient seen and examined. History obtained directly from patient.   Labs/EKG: Ordered CBC, CMP, Monospot, strep testing although PCR ,machine is currently unavailable.  Imaging: None ordered  Medications/Fluids: None ordered  Most recent vital signs reviewed and are as follows: BP 109/78 (BP Location: Right Arm)   Pulse 72   Temp 97.7 F (36.5 C) (Oral)   Resp 18   SpO2 100%   Initial impression: Patient's main complaint today is skin itching and this is a chronic problem for her.  Unrelieved with topical steroids.  Will consider additional antihistamines however this is something that we will need to be addressed as an outpatient.  No signs of infection.  She also reports sore throat.  Mild erythema on oropharyngeal exam otherwise unremarkable.  Given her risk factors, will check lab work and a Monospot.  She is handling her secretions.  Low concern for deep space abscess.  No voice changes.  No fever and vitals are overall reassuring. Grossly normal neuro exam.   12:47 PM Reassessment performed. Patient appears stable.  Using [name] interpreter, we discussed results.  She  knows that she has been anemic in the past but cannot tell me why.  She does state that she has received blood transfusion.  We discussed blood transfusion today and she is in agreement and is willing to have this done.  1 unit of blood ordered.  Labs personally reviewed and interpreted including: CBC with differential showing normal white blood cell count, hemoglobin low at 6.6 with severe microcytosis at 62.5, platelet count noted to be 122, morphology reviewed; CMP unremarkable; Monospot negative; strep test negative.  Reviewed pertinent lab work and imaging with patient at bedside. Questions answered.   Most current vital signs reviewed and are as follows: BP 131/79   Pulse 75   Temp 97.9 F (36.6 C) (Oral)   Resp 18   SpO2 100%   Plan: Patient will receive 1 unit of packed red blood cells.  Do not feel that she will require admission.  She denies any active bleeding and this is likely chronic problem for her given her past history and microcytosis, however feel that she is symptomatic enough where she would benefit from blood transfusion.  3:13 PM Signout to [place] PA-C.   Awaiting completion of blood.  She will need iron supplementation and will also prescribe hydroxyzine for ongoing itching.  This will require further outpatient follow-up.  Encouraged PCP follow-up in next 48 hours for recheck.                             Medical Decision Making Amount and/or Complexity of Data Reviewed Labs: ordered.  Risk OTC drugs. Prescription drug management.   Patient with sore throat and malaise.  Infectious workup reassuring. In regards to the patient's sore throat today, the following dangerous and potentially life threatening etiologies were considered on the differential diagnosis: Lugwig's angina, uvulitis, epiglottis, peritonsillar abscess, retropharyngeal  abscess, Lemierre's syndrome. Also considered were more common causes such as: streptococcal pharyngitis, gonococcal  pharyngitis, non-bacterial pharyngitis (cold viruses, HSV/coxsackievirus, influenza, COVID-19, infectious mononucleosis, oropharyngeal candidiasis), and other non-infectious causes including seasonal allergies/post-nasal drip, GERD/esophagitis, trauma.   She was found to have low hemoglobin today.  This appears to be anemia with worsening.  Likely contributing to malaise.  She was given iron transfusion, 1 unit here and will be started on iron supplementation.  She will need close outpatient follow-up.  She also has itching of her skin which is a chronic problem for her.  Unclear etiology.  No signs of infection.  Will give hydroxyzine for symptom control, otherwise she will need outpatient follow-up.  She might benefit from dermatology follow-up if she is able to establish care.   The patient's vital signs, pertinent lab work and imaging were reviewed and interpreted as discussed in the ED course. Hospitalization was considered for further testing, treatments, or serial exams/observation. However as patient is well-appearing, has a stable exam, and reassuring studies today, I do not feel that they warrant admission at this time. This plan was discussed with the patient who verbalizes agreement and comfort with this plan and seems reliable and able to return to the Emergency Department with worsening or changing symptoms.          Final Clinical Impression(s) / ED Diagnoses Final diagnoses:  Sore throat  Malaise  Iron deficiency anemia, unspecified iron deficiency anemia type  Itching    Rx / DC Orders ED Discharge Orders     None         Renne Crigler, PA-C 10/11/22 1516    Kommor, Wyn Forster, MD 10/11/22 2030

## 2022-10-11 NOTE — ED Notes (Signed)
Patient verbalizes understanding of discharge instructions. Opportunity for questioning and answers were provided. Armband removed by staff, pt discharged from ED. Pt ambulatory to ED waiting room with steady gait.  

## 2022-10-11 NOTE — Discharge Instructions (Addendum)
Please read and follow all provided instructions.  Your diagnoses today include:  1. Sore throat   2. Malaise   3. Iron deficiency anemia, unspecified iron deficiency anemia type     Tests performed today include: Blood cell counts and electrolytes: Show that your red blood cells were very low Iron levels: Were low Strep test and monotest: Were negative Vital signs. See below for your results today.   Medications prescribed:  Iron supplement -to help increase your blood counts, please do not take at the same time as your HIV medicine  Hydroxyzine - antihistamine  You can find this medication over-the-counter.   This medication will make you drowsy. DO NOT drive or perform any activities that require you to be awake and alert if taking this.   Take any prescribed medications only as directed.  Home care instructions:  Follow any educational materials contained in this packet.  Continue taking your home antiviral medications.   Follow-up instructions: Please follow-up with your primary care provider in the next 2 days for further evaluation of your symptoms.   Return instructions:  Please return to the Emergency Department if you experience worsening symptoms.  Return if you develop a fever, lightheadedness, pass out, severe headache or confusion Please return if you have any other emergent concerns.  Additional Information:  Your vital signs today were: BP (!) 140/97   Pulse 61   Temp 98.5 F (36.9 C) (Oral)   Resp 18   SpO2 97%  If your blood pressure (BP) was elevated above 135/85 this visit, please have this repeated by your doctor within one month. --------------

## 2022-10-12 LAB — TYPE AND SCREEN
ABO/RH(D): A POS
Antibody Screen: NEGATIVE
Unit division: 0

## 2022-10-12 LAB — BPAM RBC
Blood Product Expiration Date: 202405052359
ISSUE DATE / TIME: 202404291337
Unit Type and Rh: 6200

## 2022-10-13 ENCOUNTER — Telehealth: Payer: Self-pay

## 2022-10-13 LAB — PATHOLOGIST SMEAR REVIEW

## 2022-10-13 NOTE — Telephone Encounter (Signed)
TOC/TCM call completed. Pt expresses the desire to change providers.Transfer of Care request sent to both providers for approval.

## 2022-10-13 NOTE — Transitions of Care (Post Inpatient/ED Visit) (Signed)
10/13/2022  Name: Luva Metzger MRN: [id number] DOB: [birth year]  Today's TOC FU Call Status: Today's TOC FU Call Status:: Successful TOC FU Call Competed TOC FU Call Complete Date: 10/13/22  Transition Care Management Follow-up Telephone Call Date of Discharge: 10/11/22 Discharge Facility: [facility]) Type of Discharge: Emergency Department How have you been since you were released from the hospital?: Better Any questions or concerns?: No  Items Reviewed: Did you receive and understand the discharge instructions provided?: Yes Medications obtained,verified, and reconciled?: Yes (Medications Reviewed) Dietary orders reviewed?: NA Do you have support at home?: Yes  Medications Reviewed Today: Medications Reviewed Today     Reviewed by Cassie Freer, PT (Physical Therapist) on 07/27/22 at 1612  Med List Status: <None>   Medication Order Taking? Sig Documenting Provider Last Dose Status Informant  atovaquone (MEPRON) 750 MG/5ML suspension [id number] No Take 5 mLs (750 mg total) by mouth 2 (two) times daily with a meal.  Patient not taking: Reported on 06/21/2022   Blanchard Kelch, NP Not Taking Active   diclofenac Sodium (VOLTAREN) 1 % GEL [id number] No APPLY 4 GRAMS TOPICALLY TO THE AFFECTED AREA FOUR TIMES DAILY  Patient not taking: Reported on 06/21/2022   Blanchard Kelch, NP Not Taking Active   dolutegravir-lamiVUDine (DOVATO) 50-300 MG tablet [id number] No Take 1 tablet by mouth daily. Blanchard Kelch, NP Taking Active   fluticasone [facility]) 50 MCG/ACT nasal spray [id number]  Place 2 sprays into both nostrils daily. Garnette Gunner, MD  Active   levETIRAcetam (KEPPRA) 750 MG tablet [id number] No Take 1 tablet (750 mg total) by mouth 2 (two) times daily.  Patient not taking: Reported on 06/21/2022   Blanchard Kelch, NP Not Taking Active   levocetirizine (XYZAL ALLERGY 24HR) 5 MG tablet [id number]  Take 1 tablet (5 mg total) by mouth every evening. Garnette Gunner, MD   Expired 07/21/22 2359   naproxen (NAPROSYN) 500 MG tablet [id number] No Take 1 tablet (500 mg total) by mouth 2 (two) times daily.  Patient not taking: Reported on 06/21/2022   Sabas Sous, MD Not Taking Active   Olopatadine HCl (PATADAY) 0.7 % SOLN [id number]  Use 1 drop in each eye daily Garnette Gunner, MD  Active   triamcinolone 0.1% oint-Cerave equivalent lotion 1:1 mixture [id number] No Apply topically 3 (three) times daily as needed.  Patient not taking: Reported on 06/21/2022   Blanchard Kelch, NP Not Taking Active   triamcinolone ointment (KENALOG) 0.5 % [id number]  APPLY TOPICALLY TO THE AFFECTED AREA TWICE DAILY Blanchard Kelch, NP  Active   Vitamin D, Ergocalciferol, (DRISDOL) 1.25 MG (50000 UNIT) CAPS capsule [id number] No Take 1 capsule (50,000 Units total) by mouth every 7 (seven) days.  Patient not taking: Reported on 06/21/2022   Windell Norfolk, MD Not Taking Active             Home Care and Equipment/Supplies: Were Home Health Services Ordered?: NA Any new equipment or medical supplies ordered?: NA  Functional Questionnaire: Do you need assistance with bathing/showering or dressing?: No Do you need assistance with meal preparation?: No Do you need assistance with eating?: No Do you have difficulty maintaining continence: No Do you need assistance with getting out of bed/getting out of a chair/moving?: No Do you have difficulty managing or taking your medications?: No  Follow up appointments reviewed: PCP Follow-up appointment confirmed?: NA Specialist Hospital Follow-up appointment confirmed?: NA Do you need transportation to your follow-up appointment?: No  Do you understand care options if your condition(s) worsen?: Yes-patient verbalized understanding    SIGNATURE. Arvil Persons, BSN, RN

## 2022-10-14 ENCOUNTER — Ambulatory Visit (INDEPENDENT_AMBULATORY_CARE_PROVIDER_SITE_OTHER): Payer: Medicaid Other | Admitting: Infectious Diseases

## 2022-10-14 ENCOUNTER — Other Ambulatory Visit: Payer: Self-pay

## 2022-10-14 VITALS — BP 123/86 | HR 77 | Temp 97.2°F | Wt 205.0 lb

## 2022-10-14 DIAGNOSIS — Z9189 Other specified personal risk factors, not elsewhere classified: Secondary | ICD-10-CM | POA: Diagnosis not present

## 2022-10-14 DIAGNOSIS — Z5941 Food insecurity: Secondary | ICD-10-CM | POA: Insufficient documentation

## 2022-10-14 DIAGNOSIS — L282 Other prurigo: Secondary | ICD-10-CM

## 2022-10-14 DIAGNOSIS — R21 Rash and other nonspecific skin eruption: Secondary | ICD-10-CM | POA: Diagnosis not present

## 2022-10-14 DIAGNOSIS — R569 Unspecified convulsions: Secondary | ICD-10-CM | POA: Diagnosis not present

## 2022-10-14 DIAGNOSIS — B20 Human immunodeficiency virus [HIV] disease: Secondary | ICD-10-CM | POA: Diagnosis not present

## 2022-10-14 DIAGNOSIS — Z21 Asymptomatic human immunodeficiency virus [HIV] infection status: Secondary | ICD-10-CM

## 2022-10-14 MED ORDER — PREDNISONE 10 MG PO TABS: 40.0000 mg | ORAL_TABLET | Freq: Every day | ORAL | 0 refills | Status: DC

## 2022-10-14 MED ORDER — TRIAMCINOLONE ACETONIDE 0.5 % EX OINT: TOPICAL_OINTMENT | CUTANEOUS | 11 refills | Status: DC

## 2022-10-14 MED ORDER — HYDROXYZINE HCL 25 MG PO TABS: 25.0000 mg | ORAL_TABLET | Freq: Four times a day (QID) | ORAL | 1 refills | Status: AC

## 2022-10-14 MED ORDER — DOVATO 50-300 MG PO TABS: 1.0000 | ORAL_TABLET | Freq: Every day | ORAL | 11 refills | Status: DC

## 2022-10-14 NOTE — Assessment & Plan Note (Signed)
2/2 CNS Toxplasmosis infeciton in 2022. Her immune system has reconstituted and off secondary bacterial prophy for this. She has stopped the keppra recently d/t investigation of rash s/e which has also made no difference. She has follow up with Neurology next week which we discussed today.

## 2022-10-14 NOTE — Patient Instructions (Addendum)
  The [facility] and [facility] is in our building and you are welcome to see  [facility] - [address].  [phone]  I would like to see you back in 3 months so we can follow up on some of these problems   Keep taking your dovato - separate it from the iron by 6 hours.   Keep taking the hydroxyzine as needed every 6 hours for the itching, the cream as needed.  Will set up a new visit with the dermatology team to help figure out your rash.

## 2022-10-14 NOTE — Assessment & Plan Note (Signed)
She says she has no money and needs help navigating access to food resources, EBT, housing, disability etc.  Unfortunately [facility] is not here in the office today - we will complete referral and have them reach out to her to set up an intake and help with some of the resources she needs.  Food bags provided to her today.

## 2022-10-14 NOTE — Assessment & Plan Note (Signed)
Has done very well on Dovato for a few years now. Stopping it has made no difference in her rash/itching and I told her it is unrelated. I asked her to please resume this everyday.  Last CD4 > 225 and has been off secondary prophy for CNS toxo since April 1. With recent anemia she requested if we can delay blood today, which is reasonable.  Will repeat her CD4 and VL next OV in 1-2 months. Will keep close follow up with her to follow up on the multiple other problems.

## 2022-10-14 NOTE — Assessment & Plan Note (Signed)
She used to follow with the [facility] here in our building - we discussed that if she has a need that is not urgent she can reach out to them for a visit as well versus going to ER and inquiring long wait and high bill.

## 2022-10-14 NOTE — Progress Notes (Signed)
Name: Candace Jones  DOB: 1980-07-07 MRN: [id number] PCP: Garnette Gunner, MD    Brief Narrative:  Candace Jones is a 42 y.o. 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      Subjective:   Chief Complaint  Patient presents with   Follow-up      HPI: [name] interpretor on the phone to facilitate the visit.  Candace Jones is here for HIV follow up care. She has several concerns she wishes to discuss today.   FU in May 2024 planned with Neurology - will repeat MRI / EEG in May 2024 and possibly able to D/C keppra then (Dr. Teresa Coombs).   First, she says she is tired and sick - sore throat, dizziness and whole body is very itchy for 1 month now. She went to ER with symptoms of sore throat and malaise - found to have symptomatic anemia 6.6 and transfused a unit of blood with oral iron tablets.   She is frustrated about not being able to reach anyone on the phone when she calls the clinic. Always has to have her daughter drive her up here to schedule an appointment with me. She had a visit with a primary care provider in January but that office is too far for her to drive to and needs closer access.   She needs an answer about her rash, it is growing in distribution and it is becoming so so itchy. She does not sleep - she says "everything in her head does not let her sleep". The itching keeps her up at night significantly. She has no relief from this and is very frustrated. The cream does help as well as the hydroxyzine but nothing keeps it away. She stopped all of her medicines in an attempt to figure out what it is that's causing her symptoms. (Has only  been on dovato for a shot time frame).    Review of Systems  Constitutional:  Negative for fever and unexpected weight change.  Gastrointestinal:  Negative for diarrhea, nausea and rectal pain.  Genitourinary:  Negative for menstrual problem, pelvic pain and urgency.  Skin:  Positive for color change and rash.  Psychiatric/Behavioral:  Negative for sleep disturbance.       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     ferrous sulfate 325 (65 FE) MG tablet Take 1 tablet (325 mg total) by mouth daily. 30 tablet 0   dolutegravir-lamiVUDine (DOVATO) 50-300 MG tablet Take 1 tablet by mouth daily. 30 tablet 11   hydrOXYzine (ATARAX) 25 MG tablet Take 1 tablet (25 mg total) by mouth every 6 (six) hours. 12 tablet 0   triamcinolone ointment (KENALOG) 0.5 % APPLY TOPICALLY TO THE AFFECTED AREA TWICE DAILY 30 g 11  fluticasone (FLONASE) 50 MCG/ACT nasal spray Place 2 sprays into both nostrils daily. (Patient not taking: Reported on 10/14/2022) 16 g 6   Olopatadine HCl (PATADAY) 0.7 % SOLN Use 1 drop in each eye daily (Patient not taking: Reported on 10/14/2022) 2.5 mL 0   levocetirizine (XYZAL ALLERGY 24HR) 5 MG tablet Take 1 tablet (5 mg total) by mouth every evening. 30 tablet 0   No facility-administered medications prior to visit.     Allergies  Allergen Reactions   Tenofovir Other (See Comments)    Patient started on Biktarvy - acute renal failure shortly after with creatinine 5.2. Unclear if related to TAF but would approach with caution if re-trialed.     Social History   Tobacco Use   Smoking status: Never     Passive exposure: Never   Smokeless tobacco: Never  Vaping Use   Vaping Use: Never used  Substance Use Topics   Alcohol use: Never   Drug use: Never    Family History  Problem Relation Age of Onset   Breast cancer Neg Hx     Social History   Substance and Sexual Activity  Sexual Activity Yes   Birth control/protection: None   Comment: accepted condoms     Objective:   Vitals:   10/14/22 0829  BP: 123/86  Pulse: 77  Temp: (!) 97.2 F (36.2 C)  TempSrc: Temporal  SpO2: 100%  Weight: 205 lb (93 kg)   Body mass index is 31.17 kg/m.  Physical Exam Constitutional:      Appearance: Normal appearance. She is not ill-appearing.  HENT:     Mouth/Throat:     Mouth: Mucous membranes are moist.     Pharynx: Oropharynx is clear.  Eyes:     General: No scleral icterus. Cardiovascular:     Rate and Rhythm: Normal rate and regular rhythm.  Pulmonary:     Effort: Pulmonary effort is normal.  Skin:    Findings: Rash (hyperpigmented scaled patches/papules noted all over body.s) present.  Neurological:     Mental Status: She is oriented to person, place, and time.  Psychiatric:        Mood and Affect: Mood normal.        Thought Content: Thought content normal.      Lab Results Lab Results  Component Value Date   WBC 4.3 10/11/2022   HGB 6.6 (LL) 10/11/2022   HCT 23.2 (L) 10/11/2022   MCV 62.5 (L) 10/11/2022   PLT 122 (L) 10/11/2022    Lab Results  Component Value Date   CREATININE 0.64 10/11/2022   BUN 9 10/11/2022   NA 136 10/11/2022   K 3.9 10/11/2022   CL 104 10/11/2022   CO2 22 10/11/2022    Lab Results  Component Value Date   ALT 13 10/11/2022   AST 24 10/11/2022   ALKPHOS 50 10/11/2022   BILITOT 0.5 10/11/2022    Lab Results  Component Value Date   CHOL 123 01/07/2021   HDL 55 01/07/2021   LDLCALC 51 01/07/2021   TRIG 85 01/07/2021   CHOLHDL 2.2 01/07/2021   HIV 1 RNA Quant (Copies/mL)  Date Value  05/27/2022 Not Detected  01/05/2022  Not Detected  09/02/2021 Not Detected   CD4 T Cell Abs (/uL)  Date Value  05/27/2022 268 (L)  01/05/2022 169 (L)  09/02/2021 165 (L)     Assessment & Plan:   Problem List Items Addressed This Visit       High  Asymptomatic HIV infection, CD4 >200 and <500 (HCC)    Has done very well on Dovato for a few years now. Stopping it has made no difference in her rash/itching and I told her it is unrelated. I asked her to please resume this everyday.  Last CD4 > 225 and has been off secondary prophy for CNS toxo since April 1. With recent anemia she requested if we can delay blood today, which is reasonable.  Will repeat her CD4 and VL next OV in 1-2 months. Will keep close follow up with her to follow up on the multiple other problems.       Relevant Medications   dolutegravir-lamiVUDine (DOVATO) 50-300 MG tablet     Unprioritized   Seizure (HCC)    2/2 CNS Toxplasmosis infeciton in 2022. Her immune system has reconstituted and off secondary bacterial prophy for this. She has stopped the keppra recently d/t investigation of rash s/e which has also made no difference. She has follow up with Neurology next week which we discussed today.       Skin eruption    She has had significant worsening of this problem since I last saw her. I do think seasonal allergies are contributing a little to this. Continue hydroxyzine 25 mg Q6h for relief - refills provided today.  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 I offered her a prednisone taper but she would rather continue topical prednisone / hydroxyzine and see what dermatology thinks if we can get her in for a quick appointment.   She has always screened negative for syphilis (though completely atypical for that given duration and symptoms).       Food insecurity    She says she has no money and needs help navigating access to food resources, EBT, housing, disability etc.  Unfortunately [facility] is not here in  the office today - we will complete referral and have them reach out to her to set up an intake and help with some of the resources she needs.  Food bags provided to her today.       Has difficulty accessing primary care provider for most visits    She used to follow with the [facility] here in our building - we discussed that if she has a need that is not urgent she can reach out to them for a visit as well versus going to ER and inquiring long wait and high bill.       Other Visit Diagnoses     Pruritic rash    -  Primary   Relevant Orders   Ambulatory referral to Dermatology     Return in about 2 months (around 12/14/2022).   Total time spent during encounter includes 35 minutes including time spent reviewing ER visit/labs recently, evaluation with interpretor services, counseling of medication management, coordination of dermatology /primary care referrals and community support access.   Rexene Alberts, MSN, NP-C [facility] Infectious Disease [facility] Pager: [phone] Office: [phone]  10/14/22

## 2022-10-14 NOTE — Assessment & Plan Note (Signed)
She has had significant worsening of this problem since I last saw her. I do think seasonal allergies are contributing a little to this. Continue hydroxyzine 25 mg Q6h for relief - refills provided today.  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 I offered her a prednisone taper but she would rather continue topical prednisone / hydroxyzine and see what dermatology thinks if we can get her in for a quick appointment.   She has always screened negative for syphilis (though completely atypical for that given duration and symptoms).

## 2022-10-18 ENCOUNTER — Ambulatory Visit: Payer: Medicaid Other | Admitting: Neurology

## 2022-10-18 ENCOUNTER — Encounter: Payer: Self-pay | Admitting: Neurology

## 2022-10-18 VITALS — BP 118/82 | Wt 205.0 lb

## 2022-10-18 DIAGNOSIS — B582 Toxoplasma meningoencephalitis: Secondary | ICD-10-CM | POA: Diagnosis not present

## 2022-10-18 DIAGNOSIS — G40909 Epilepsy, unspecified, not intractable, without status epilepticus: Secondary | ICD-10-CM

## 2022-10-18 DIAGNOSIS — B20 Human immunodeficiency virus [HIV] disease: Secondary | ICD-10-CM | POA: Diagnosis not present

## 2022-10-18 NOTE — Progress Notes (Signed)
Reason for visit: Seizures follow up  Referring physician: [facility] Candace Jones is a 42 y.o. female  INTERVAL HISTORY 10/18/2022:  Patient presents today for follow-up, she is here with interpreter but we spoke in [name].  Last visit was a year ago.  Since then, she has not had any seizure or seizure like activity.  She reports due to itching she has self discontinued the Keppra and the itching resolved.  Currently she is off the Keppra and has not had any seizures.  She is interested in applying for disability as she has difficulties finding a job due to her chronic health issues.  INTERVAL HISTORY 10/14/2021 Patient presents for follow-up, last visit was in July with Dr. Anne Hahn.  Since that time she denies any additional seizures.  She reports completing treatment for the brain infection.  She is compliant with Keppra 750 mg twice daily, denies any side effect from the medication, denies any seizure or seizure-like activity. Her current complaint or rash and itchiness for which she has been prescribed cream which patient report helps her a lot.  There is also complain of some neck stiffness and pain but patient is not taking any ibuprofen or Tylenol for the pain.   History of present illness (Dr. Anne Hahn): Ms. Patenaude is a 42 year old right-handed black African female with a history of HIV infection, AIDS, and a recent toxoplasmosis infection of the brain.  The patient presented to the hospital on 29 September 2020 with right-sided weakness and had a witnessed generalized seizure event in the emergency room.  The patient was noted to have severe edema of the brain affecting the frontal and temporal areas in 2 locations with ring enhancement.  The patient was determined to have an infection from toxoplasmosis.  She has been started on appropriate medical treatment, and the most recent MRI shows good improvement in the edema of the brain.  The patient was placed on Keppra while in the hospital,  she remains on 750 mg twice daily.  She seems to tolerate this fairly well.  The patient still reports occasional headaches and some visual blurring.  The headaches are associated with sharp lancinating pains that are quite brief but relatively frequent.  She has light sensitivity.  She has chronic right foot pain following a fall on 16 September 2020.  The x-ray at that time showed a chronic fracture of the navicular bone with sclerosis.  The patient has not had any gait instability or difficulty controlling the bowels or the bladder.  She still has some underlying fatigue.  She is no longer working since the hospitalization.  She does not operate a motor vehicle.  She reports some neck stiffness which is improved with heat.  She is sent to this office for further evaluation.  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 OR;  Service: Neurosurgery;  Laterality: Left;   buttock surgery     CRANIOTOMY Left 10/02/2020   Procedure: LEFT CRANIOTOMY FOR TUMOR BIOPSY/ RESECTION with BrainLab;  Surgeon: Tressie Stalker, MD;  Location: [facility] OR;  Service: Neurosurgery;  Laterality:  Left;   IR RADIOLOGIST EVAL & MGMT  10/17/2020   IR RADIOLOGIST EVAL & MGMT  12/02/2020    Family History  Problem Relation Age of Onset   Breast cancer Neg Hx     Social history:  reports that she has never smoked. She has never been exposed to tobacco smoke. She has never used smokeless tobacco. She reports that she does not drink alcohol and does not use drugs.  Medications:   Current Outpatient  Medications:    Cholecalciferol (VITAMIN D3) 25 MCG (1000 UT) CAPS, Take by mouth., Disp: , Rfl:    dolutegravir-lamiVUDine (DOVATO) 50-300 MG tablet, Take 1 tablet by mouth daily., Disp: 30 tablet, Rfl: 11   ferrous sulfate 325 (65 FE) MG tablet, Take 1 tablet (325 mg total) by mouth daily., Disp: 30 tablet, Rfl: 0   fluticasone (FLONASE) 50 MCG/ACT nasal spray, Place 2 sprays into both nostrils daily., Disp: 16 g, Rfl: 6   hydrOXYzine (ATARAX) 25 MG tablet, Take 1 tablet (25 mg total) by mouth every 6 (six) hours., Disp: 60 tablet, Rfl: 1   Olopatadine HCl (PATADAY) 0.7 % SOLN, Use 1 drop in each eye daily, Disp: 2.5 mL, Rfl: 0   triamcinolone ointment (KENALOG) 0.5 %, APPLY TOPICALLY TO THE AFFECTED AREA TWICE DAILY (Patient not taking: Reported on 10/18/2022), Disp: 30 g, Rfl: 11    Allergies  Allergen Reactions   Tenofovir Other (See Comments)    Patient started on Biktarvy - acute renal failure shortly after with creatinine 5.2. Unclear if related to TAF but would approach with caution if re-trialed.     ROS:  Out of a complete 14 system review of symptoms, the patient complains only of the following symptoms, and all other reviewed systems are negative.  Right foot pain Headaches Visual blurring Fatigue  Blood pressure 118/82, weight 205 lb (93 kg).  Physical Exam  General: The patient is alert and cooperative at the time of the examination.  Eyes: Pupils are equal, round, and reactive to light. Discs are flat bilaterally.  Neck: The neck is supple, no carotid bruits are noted.  Respiratory: The respiratory examination is clear.  Cardiovascular: The cardiovascular examination reveals a regular rate and rhythm, no obvious murmurs or rubs are noted.  Skin: Extremities are without significant edema.  Neurologic Exam  Mental status: The patient is alert and oriented x 3 at the time of the examination. The patient has apparent normal recent and remote memory, with an  apparently normal attention span and concentration ability.  Cranial nerves: Facial symmetry is present. There is good sensation of the face to pinprick and soft touch on the left, decreased on the right. The strength of the facial muscles and the muscles to head turning and shoulder shrug are normal bilaterally. Speech is well enunciated, no aphasia or dysarthria is noted. Extraocular movements are full. Visual fields are full. The tongue is midline, and the patient has symmetric elevation of the soft palate. No obvious hearing deficits are noted.  Motor: The motor testing reveals 5 over 5 strength of all 4 extremities. Good symmetric motor tone is noted throughout.  Sensory: Sensory testing is intact to pinprick, soft touch, vibration sensation, and position sense on all 4 extremities, with exception some decreased pinprick sensation on the right leg as compared to the left. No evidence of extinction is noted.  Coordination: Cerebellar testing reveals good finger-nose-finger and heel-to-shin bilaterally.  Gait and station: Gait is normal. Tandem gait is normal. Romberg is  negative. No drift is seen.  Reflexes: Deep tendon reflexes are symmetric and normal bilaterally. Toes are downgoing bilaterally.   MRI brain 09/30/20: Two cystic brain masses in the left cerebral hemisphere with extensive vasogenic edema and mild midline shift. Metastatic disease is the leading diagnostic consideration but the mass morphology isnotable for a discrete nodular component, colloid vesicular neurocysticercosis should also be considered.   * MRI scan images were reviewed online. I agree with the written report.  CT Head 03/07/22 No acute intracranial abnormality.   EEG 10/01/20:  IMPRESSION: This study is suggestive of cortical dysfunction in left temporal region likely secondary underlying structural abnormality, ictal, postictal state. Additionally there is evidence of moderate diffuse encephalopathy,  nonspecific etiology.  No seizures or epileptiform discharges were seen throughout the recording.    Assessment/Plan:  1.  HIV infection, AIDS  2.  S/p Cerebral toxoplasmosis, left brain,   3.  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  She self discontinue Keppra and has been doing well. We will keep her off Keppra. I will see her in 6 months for follow up, she is comfortable with plans.    I have spent a total of 30 minutes dedicated to this patient today, preparing to see patient, performing a medically appropriate examination and evaluation, ordering tests and/or medications and procedures, and counseling and educating the patient/family/caregiver; independently interpreting result and communicating results to the family/patient/caregiver; and documenting clinical information in the electronic medical record.    Windell Norfolk, MD  10/18/2022 10:05 AM  [facility] [address] [place], [place] [address]  Phone [phone] Fax [phone]

## 2022-10-18 NOTE — Patient Instructions (Signed)
She self discontinue Keppra, we will continue to observe her off the medications, if there is seizure re-occurrence, then we will restart her on Keppra 750 mg twice daily  Continue your other medications  Please drop the disability paperwork, we will complete them  Return in 6 months or sooner if worse

## 2022-10-20 NOTE — Telephone Encounter (Signed)
Pt scheduled for transfer of care appt Tues 5/14 with Lauren McElwee.

## 2022-10-26 ENCOUNTER — Encounter: Payer: Self-pay | Admitting: Nurse Practitioner

## 2022-10-26 ENCOUNTER — Ambulatory Visit (INDEPENDENT_AMBULATORY_CARE_PROVIDER_SITE_OTHER): Payer: Medicaid Other | Admitting: Nurse Practitioner

## 2022-10-26 VITALS — BP 112/78 | HR 77 | Temp 97.3°F | Ht 68.0 in | Wt 204.8 lb

## 2022-10-26 DIAGNOSIS — Z21 Asymptomatic human immunodeficiency virus [HIV] infection status: Secondary | ICD-10-CM

## 2022-10-26 DIAGNOSIS — D500 Iron deficiency anemia secondary to blood loss (chronic): Secondary | ICD-10-CM

## 2022-10-26 DIAGNOSIS — H579 Unspecified disorder of eye and adnexa: Secondary | ICD-10-CM

## 2022-10-26 DIAGNOSIS — E559 Vitamin D deficiency, unspecified: Secondary | ICD-10-CM | POA: Diagnosis not present

## 2022-10-26 DIAGNOSIS — R569 Unspecified convulsions: Secondary | ICD-10-CM

## 2022-10-26 DIAGNOSIS — H538 Other visual disturbances: Secondary | ICD-10-CM

## 2022-10-26 LAB — CBC
HCT: 27.1 % — ABNORMAL LOW (ref 36.0–46.0)
Hemoglobin: 8.6 g/dL — ABNORMAL LOW (ref 12.0–15.0)
MCHC: 31.6 g/dL (ref 30.0–36.0)
MCV: 67.4 fl — ABNORMAL LOW (ref 78.0–100.0)
Platelets: 178 10*3/uL (ref 150.0–400.0)
RBC: 4.02 Mil/uL (ref 3.87–5.11)
RDW: 35 % — ABNORMAL HIGH (ref 11.5–15.5)
WBC: 5.3 10*3/uL (ref 4.0–10.5)

## 2022-10-26 LAB — VITAMIN D 25 HYDROXY (VIT D DEFICIENCY, FRACTURES): VITD: 17.85 ng/mL — ABNORMAL LOW (ref 30.00–100.00)

## 2022-10-26 MED ORDER — PATADAY 0.7 % OP SOLN
OPHTHALMIC | 0 refills | Status: AC
Start: 2022-10-26 — End: ?

## 2022-10-26 MED ORDER — VITAMIN D3 50 MCG (2000 UT) PO CAPS: 2000.0000 [IU] | ORAL_CAPSULE | Freq: Every day | ORAL | 1 refills | Status: DC

## 2022-10-26 NOTE — Assessment & Plan Note (Addendum)
Chronic, stable. She is currently taking Dovato daily.  She is also following with infectious disease routinely.  Continue collaboration recommendations from infectious disease.

## 2022-10-26 NOTE — Assessment & Plan Note (Addendum)
Chronic, ongoing. She received an iron and blood transfusion on 10/11/2022.  Will recheck CBC today.  Continue iron supplement 325 mg daily.

## 2022-10-26 NOTE — Patient Instructions (Signed)
It was great to see you!  Keep taking the iron every day.   I have refilled your vitamin D and your eye drops.  We are checking your labs today and will let you know the results via mychart/phone.   I have placed a referral to an eye doctor, they will call to schedule.   Let's follow-up in 6 months, sooner if you have concerns.  If a referral was placed today, you will be contacted for an appointment. Please note that routine referrals can sometimes take up to 3-4 weeks to process. Please call our office if you haven't heard anything after this time frame.  Take care,  Rodman Pickle, NP

## 2022-10-26 NOTE — Assessment & Plan Note (Addendum)
Chronic, stable. Secondary to CNS toxoplasmosis infection in 2022.  Neurology recently stopped her Keppra.  She has not had any seizures.  Continue collaboration recommendations from infectious disease and neurology.

## 2022-10-26 NOTE — Assessment & Plan Note (Signed)
She is having ongoing blurry vision, glucose has been normal over the past years.  Will place referral to ophthalmology for eye exam.

## 2022-10-26 NOTE — Assessment & Plan Note (Signed)
She is currently taking vitamin D 2000 units daily, will check vitamin D levels and adjust regimen based on results.

## 2022-10-26 NOTE — Progress Notes (Signed)
New Patient Visit  BP 112/78 (BP Location: Left Arm)   Pulse 77   Temp (!) 97.3 F (36.3 C)   Ht 5\' 8"  (1.727 m)   Wt 204 lb 12.8 oz (92.9 kg)   LMP 10/23/2022 (Exact Date)   SpO2 99%   BMI 31.14 kg/m    Subjective:    Patient ID: Candace Jones, female    DOB: 19-Dec-1980, 42 y.o.   MRN: [id number]  CC: Chief Complaint  Patient presents with   Transitions Of Care    Rx refills, No concerns   Visit completed with in-person interpreter  HPI: Candace Jones is a 42 y.o. female presents to transfer care to a new provider.  Introduced to Publishing rights manager role and practice setting.  All questions answered.  Discussed provider/patient relationship and expectations.  She has a history of HIV and is currently following with infectious disease.  She is taking Dovato 1 tablet daily.  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  She was taking Keppra, however once the infection was treated neurology did take her off Keppra.  She has not had any seizures in the last few years.  She has a history of iron deficiency anemia.  She went to the ER on 10/11/2022 for URI symptoms and was noted to have a hemoglobin of 6.6.  She received a unit of blood and an iron.  She was also discharged on an iron supplement daily.  She denies shortness of breath, chest pain, dizziness.  She is having ongoing blurry vision.  She has been using Pataday eyedrops for itching and irritation.  She would like a referral to an eye doctor.  She denies urinary frequency, eye pain.  She has a history of vitamin D insufficiency and has been taking a vitamin D supplement daily.  Depression screen done:     10/14/2022    9:17 AM 05/27/2022   10:52 AM 05/24/2022    9:14 AM 01/05/2022    8:39 AM 09/22/2021    9:19 AM  Depression screen PHQ 2/9  Decreased Interest 0 0 0 0 0  Down, Depressed, Hopeless 0 0 0 0 0  PHQ - 2 Score 0 0 0 0 0  Altered  sleeping 2  0    Tired, decreased energy 3  0    Change in appetite   0    Feeling bad or failure about yourself    0    Trouble concentrating   0    Moving slowly or fidgety/restless   0    Suicidal thoughts   0    PHQ-9 Score 5  0    Difficult doing work/chores   Not difficult at all      Past Medical History:  Diagnosis Date   AIDS (acquired immune deficiency syndrome) (HCC)    Blood transfusion without reported diagnosis    Hydronephrosis    Hyperkalemia 11/05/2020   Perirectal abscess    Perirectal abscess 09/02/2021   Seizures (HCC)    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   Urinary incontinence 09/02/2021    Past Surgical History:  Procedure Laterality Date   APPLICATION OF CRANIAL NAVIGATION Left 10/02/2020   Procedure: APPLICATION OF CRANIAL NAVIGATION;  Surgeon: Tressie Stalker, MD;  Location: [facility] OR;  Service: Neurosurgery;  Laterality: Left;   buttock surgery     CRANIOTOMY Left 10/02/2020   Procedure: LEFT CRANIOTOMY FOR TUMOR BIOPSY/ RESECTION with BrainLab;  Surgeon: Tressie Stalker, MD;  Location: [facility] OR;  Service: Neurosurgery;  Laterality: Left;   IR RADIOLOGIST EVAL & MGMT  10/17/2020   IR RADIOLOGIST EVAL & MGMT  12/02/2020    Family History  Problem Relation Age of Onset   Breast cancer Neg Hx      Social History   Tobacco Use   Smoking status: Never    Passive exposure: Never   Smokeless tobacco: Never  Vaping Use   Vaping Use: Never used  Substance Use Topics   Alcohol use: Never   Drug use: Never    Current Outpatient Medications on File Prior to Visit  Medication Sig Dispense Refill   dolutegravir-lamiVUDine (DOVATO) 50-300 MG tablet Take 1 tablet by mouth daily. 30 tablet 11   ferrous sulfate 325 (65 FE) MG tablet Take 1 tablet (325 mg total) by mouth daily. 30 tablet 0   hydrOXYzine (ATARAX) 25 MG tablet Take 1 tablet (25 mg total) by mouth every 6 (six) hours. 60 tablet 1   fluticasone (FLONASE) 50 MCG/ACT nasal spray  Place 2 sprays into both nostrils daily. (Patient not taking: Reported on 10/26/2022) 16 g 6   triamcinolone ointment (KENALOG) 0.5 % APPLY TOPICALLY TO THE AFFECTED AREA TWICE DAILY (Patient not taking: Reported on 10/18/2022) 30 g 11   No current facility-administered medications on file prior to visit.     Review of Systems  Constitutional:  Positive for fatigue. Negative for fever.  HENT: Negative.    Respiratory: Negative.    Cardiovascular: Negative.   Gastrointestinal: Negative.   Genitourinary: Negative.   Musculoskeletal: Negative.   Skin: Negative.   Neurological: Negative.   Psychiatric/Behavioral: Negative.        Objective:    BP 112/78 (BP Location: Left Arm)   Pulse 77   Temp (!) 97.3 F (36.3 C)   Ht 5\' 8"  (1.727 m)   Wt 204 lb 12.8 oz (92.9 kg)   LMP 10/23/2022 (Exact Date)   SpO2 99%   BMI 31.14 kg/m   Wt Readings from Last 3 Encounters:  10/26/22 204 lb 12.8 oz (92.9 kg)  10/18/22 205 lb (93 kg)  10/14/22 205 lb (93 kg)    BP Readings from Last 3 Encounters:  10/26/22 112/78  10/18/22 118/82  10/14/22 123/86    Physical Exam Vitals and nursing note reviewed.  Constitutional:      General: She is not in acute distress.    Appearance: Normal appearance.  HENT:     Head: Normocephalic and atraumatic.     Right Ear: Tympanic membrane, ear canal and external ear normal.     Left Ear: Tympanic membrane, ear canal and external ear normal.  Eyes:     Conjunctiva/sclera: Conjunctivae normal.  Cardiovascular:     Rate and Rhythm: Normal rate and regular rhythm.     Pulses: Normal pulses.     Heart sounds: Normal heart sounds.  Pulmonary:     Effort: Pulmonary effort is normal.     Breath sounds: Normal breath sounds.  Abdominal:     Palpations: Abdomen is soft.     Tenderness: There is no abdominal tenderness.  Musculoskeletal:        General: Normal range of motion.     Cervical back: Normal range of motion and neck supple.     Right lower leg:  No edema.     Left lower leg: No edema.  Lymphadenopathy:     Cervical: No cervical adenopathy.  Skin:  General: Skin is warm and dry.  Neurological:     General: No focal deficit present.     Mental Status: She is alert and oriented to person, place, and time.     Cranial Nerves: No cranial nerve deficit.     Coordination: Coordination normal.     Gait: Gait normal.  Psychiatric:        Mood and Affect: Mood normal.        Behavior: Behavior normal.        Thought Content: Thought content normal.        Judgment: Judgment normal.        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  Neurology recently stopped her Keppra.  She has not had any seizures.  Continue collaboration recommendations from infectious disease and neurology.      Iron deficiency anemia    Chronic, ongoing. She received an iron and blood transfusion on 10/11/2022.  Will recheck CBC today.  Continue iron supplement 325 mg daily.      Relevant Orders   CBC   Asymptomatic HIV infection, CD4 >200 and <500 (HCC) - Primary    Chronic, stable. She is currently taking Dovato daily.  She is also following with infectious disease routinely.  Continue collaboration recommendations from infectious disease.      Blurry vision    She is having ongoing blurry vision, glucose has been normal over the past years.  Will place referral to ophthalmology for eye exam.      Relevant Orders   Ambulatory referral to Ophthalmology   Vitamin D insufficiency    She is currently taking vitamin D 2000 units daily, will check vitamin D levels and adjust regimen based on results.      Relevant Orders   VITAMIN D 25 Hydroxy (Vit-D Deficiency, Fractures)   Other Visit Diagnoses     Itchy eyes       Pataday eye drops refilled   Relevant Medications   Olopatadine HCl (PATADAY) 0.7 % SOLN        Follow up  plan: Return in about 6 months (around 04/28/2023) for CPE.

## 2022-11-10 ENCOUNTER — Telehealth: Payer: Self-pay

## 2022-11-10 NOTE — Telephone Encounter (Signed)
Received notification from [facility] case manager that patient needed refills of a medication. Called [facility], she is requesting iron tablet refills. She is now in care with a PCP, provided her with their number to request refills.   Sandie Ano, RN

## 2022-11-11 ENCOUNTER — Other Ambulatory Visit: Payer: Self-pay | Admitting: Nurse Practitioner

## 2022-11-11 MED ORDER — FERROUS SULFATE 325 (65 FE) MG PO TABS: 325.0000 mg | ORAL_TABLET | Freq: Every day | ORAL | 1 refills | Status: DC

## 2022-11-11 NOTE — Telephone Encounter (Signed)
I called patient through the use of an interpreter. Patient would like a refill of the ferrous sulfate and also another medication that she can not read the bottle. I tried to go over medications with the patient and she was unsure. Candace Jones said that she will go to the pharmacy for help reading the bottle.  Also she said that she did not receive eye drops from 10/27/22 visit. I told patient that I would call the pharmacy.  I called [facility] pharmacy and the Pataday Rx was not covered under her insurance and the out of pocket cost is $25.99 and patient can also use a discount card. I asked for the pharmacy tech to fill the prescription.

## 2022-11-11 NOTE — Telephone Encounter (Signed)
Candace Jones [phone]   Pt called at 5:00 5/29 to ask for a refill of iron and other meds. She was unable to tell me the names of the others. Maybe you will know what she needs.

## 2022-11-11 NOTE — Telephone Encounter (Signed)
LVM for patient to return call. 

## 2022-11-15 NOTE — Telephone Encounter (Signed)
I called patient through the use of an interpreter and notified her of below message

## 2022-11-22 NOTE — Progress Notes (Signed)
The ASCVD Risk score (Arnett DK, et al., 2019) failed to calculate for the following reasons:   The valid total cholesterol range is 130 to 320 mg/dL  Sandie Ano, RN

## 2022-12-09 ENCOUNTER — Other Ambulatory Visit: Payer: Self-pay

## 2022-12-09 ENCOUNTER — Ambulatory Visit (INDEPENDENT_AMBULATORY_CARE_PROVIDER_SITE_OTHER): Payer: Medicaid Other | Admitting: Infectious Diseases

## 2022-12-09 ENCOUNTER — Ambulatory Visit (INDEPENDENT_AMBULATORY_CARE_PROVIDER_SITE_OTHER): Payer: Medicaid Other | Admitting: Dermatology

## 2022-12-09 ENCOUNTER — Encounter: Payer: Self-pay | Admitting: Infectious Diseases

## 2022-12-09 VITALS — BP 129/87 | HR 77 | Temp 98.1°F | Resp 16 | Wt 206.6 lb

## 2022-12-09 DIAGNOSIS — L281 Prurigo nodularis: Secondary | ICD-10-CM | POA: Diagnosis not present

## 2022-12-09 DIAGNOSIS — G479 Sleep disorder, unspecified: Secondary | ICD-10-CM

## 2022-12-09 DIAGNOSIS — R21 Rash and other nonspecific skin eruption: Secondary | ICD-10-CM | POA: Diagnosis not present

## 2022-12-09 DIAGNOSIS — E559 Vitamin D deficiency, unspecified: Secondary | ICD-10-CM | POA: Diagnosis not present

## 2022-12-09 DIAGNOSIS — D509 Iron deficiency anemia, unspecified: Secondary | ICD-10-CM

## 2022-12-09 DIAGNOSIS — Z21 Asymptomatic human immunodeficiency virus [HIV] infection status: Secondary | ICD-10-CM

## 2022-12-09 DIAGNOSIS — Z5941 Food insecurity: Secondary | ICD-10-CM

## 2022-12-09 DIAGNOSIS — L299 Pruritus, unspecified: Secondary | ICD-10-CM

## 2022-12-09 DIAGNOSIS — D500 Iron deficiency anemia secondary to blood loss (chronic): Secondary | ICD-10-CM

## 2022-12-09 DIAGNOSIS — D508 Other iron deficiency anemias: Secondary | ICD-10-CM

## 2022-12-09 LAB — CBC
MCH: 23.7 pg — ABNORMAL LOW (ref 27.0–33.0)
RBC: 4.55 10*6/uL (ref 3.80–5.10)
WBC: 3.3 10*3/uL — ABNORMAL LOW (ref 3.8–10.8)

## 2022-12-09 MED ORDER — CLOBETASOL PROPIONATE 0.05 % EX CREA: 1.0000 | TOPICAL_CREAM | Freq: Two times a day (BID) | CUTANEOUS | 0 refills | Status: DC

## 2022-12-09 MED ORDER — PREDNISONE 10 MG PO TABS: ORAL_TABLET | ORAL | 0 refills | Status: AC

## 2022-12-09 NOTE — Assessment & Plan Note (Signed)
Required blood transfusion in April - recheck today with VL and CD4.

## 2022-12-09 NOTE — Progress Notes (Signed)
Name: Candace Jones  DOB: 1980-08-28 MRN: [id number] PCP: Gerre Scull, NP    Brief Narrative:  Candace Jones is a 42 y.o. 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     Subjective:   Chief Complaint  Patient presents with   Follow-up    B20       HPI: Candace Jones interpretor on the phone to facilitate the visit.  Candace Jones is here for HIV follow up care. She has several concerns she wishes to discuss today.   FU 10/18/22 with Dr. Teresa Coombs with Neurology - stopped keppra and doing well off that. Helping with disability paperwork to build her case.   10/26/2022 - established with new primary care provider - Rodman Pickle, NP   She has an appointment this afternoon with dermatology for chronic pruritic whole body rash. Since our last visit 2 months ago this has persisted. Using hydroxyzine and topical steroids to help with symptoms.  She is thankful to meet with the dermatology to try to see what this is and if will help.   THP provided with food stamp resources. Had electricity cut off recently and has utility bills outstanding; her sister helped her pay the minimum bill to allow it to be cut back on ($300). She does not have her case manager's cell phone number.     Review of Systems  Constitutional:  Negative for fever and unexpected weight change.  Gastrointestinal:  Negative for diarrhea, nausea and rectal pain.  Genitourinary:  Negative for menstrual problem, pelvic pain and urgency.  Skin:  Positive for color change and rash.  Psychiatric/Behavioral:  Negative for sleep disturbance.       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 90 capsule 1   dolutegravir-lamiVUDine (DOVATO) 50-300 MG tablet Take 1 tablet by mouth daily. 30 tablet 11   ferrous sulfate 325 (65 FE) MG tablet Take 1 tablet (325 mg total) by mouth daily. 30 tablet 1   hydrOXYzine (ATARAX) 25 MG tablet Take 1 tablet (25 mg total) by mouth every 6 (six) hours. 60 tablet 1   Olopatadine HCl (PATADAY) 0.7 % SOLN Use 1 drop in each eye daily 2.5 mL 0   fluticasone (FLONASE) 50 MCG/ACT nasal spray Place 2 sprays into both nostrils daily. (Patient not taking: Reported on 10/26/2022) 16 g 6   triamcinolone ointment (KENALOG) 0.5 % APPLY TOPICALLY TO THE AFFECTED AREA TWICE DAILY (Patient not taking: Reported on 10/18/2022) 30 g 11   No facility-administered medications prior to visit.     Allergies  Allergen Reactions   Tenofovir Other (See Comments)    Patient started on Biktarvy -  acute renal failure shortly after with creatinine 5.2. Unclear if related to TAF but would approach with caution if re-trialed.     Social History   Tobacco Use   Smoking status: Never    Passive exposure: Never   Smokeless tobacco: Never  Vaping Use   Vaping Use: Never used  Substance Use Topics   Alcohol use: Never   Drug use: Never    Family History  Problem Relation Age of Onset   Breast cancer Neg Hx     Social History   Substance and Sexual Activity  Sexual Activity Yes   Birth control/protection: None   Comment: accepted condoms     Objective:   Vitals:   12/09/22 0850  BP:  129/87  Pulse: 77  Resp: 16  Temp: 98.1 F (36.7 C)  TempSrc: Temporal  SpO2: 100%  Weight: 206 lb 9.6 oz (93.7 kg)   Body mass index is 31.41 kg/m.  Physical Exam Constitutional:      Appearance: Normal appearance. She is not ill-appearing.  HENT:     Mouth/Throat:     Mouth: Mucous membranes are moist.     Pharynx: Oropharynx is clear.  Eyes:     General: No scleral icterus. Cardiovascular:     Rate and Rhythm: Normal rate and regular rhythm.  Pulmonary:     Effort: Pulmonary effort is normal.  Skin:    Findings: Rash (hyperpigmented scaled patches/papules noted all over body.s) present.  Neurological:     Mental Status: She is oriented to person, place, and time.  Psychiatric:        Mood and Affect: Mood normal.        Thought Content: Thought content normal.      Lab Results Lab Results  Component Value Date   WBC 5.3 10/26/2022   HGB 8.6 Repeated and verified X2. (L) 10/26/2022   HCT 27.1 (L) 10/26/2022   MCV 67.4 Repeated and verified X2. (L) 10/26/2022   PLT 178.0 10/26/2022    Lab Results  Component Value Date   CREATININE 0.64 10/11/2022   BUN 9 10/11/2022   NA 136 10/11/2022   K 3.9 10/11/2022   CL 104 10/11/2022   CO2 22 10/11/2022    Lab Results  Component Value Date   ALT 13 10/11/2022   AST 24 10/11/2022   ALKPHOS 50 10/11/2022   BILITOT 0.5 10/11/2022    Lab Results  Component Value Date   CHOL 123 01/07/2021   HDL 55 01/07/2021   LDLCALC 51 01/07/2021   TRIG 85 01/07/2021   CHOLHDL 2.2 01/07/2021   HIV 1 RNA Quant (Copies/mL)  Date Value  05/27/2022 Not Detected  01/05/2022 Not Detected  09/02/2021 Not Detected   CD4 T Cell Abs (/uL)  Date Value  05/27/2022 268 (L)  01/05/2022 169 (L)  09/02/2021 165 (L)     Assessment & Plan:   Problem List Items Addressed This Visit       High   Asymptomatic HIV infection, CD4 >200 and <500 (HCC) - Primary    Very well controlled on once daily Dovato. No access / tolerance  concerns.  No changes to insurance coverage.  No dental needs today.  No concern over anxious/depressed mood.  Sexual health and family planning discussed - none needed today - needs pap smear sometime soon once other priorities are met.  Will need discussion around statin tx (I anticipate she will be leery about starting a new medicine since she worries one of  her medications causes her rash). Needs mamography as well.   Return in about 4 months (around 04/10/2023).        Relevant Orders   HIV 1 RNA quant-no reflex-bld   T-helper cells (CD4) count   CBC     Unprioritized   Food insecurity    We need to get her case manager information to her - met with Johaura today and will coordinate contact with her CM team at [facility].       Iron deficiency anemia    Required blood transfusion in April - recheck today with VL and CD4.       Skin eruption    Greatly appreciate dermatology assistance - her appointment is this afternoon.      Return in about 4 months (around 04/10/2023).    Rexene Alberts, MSN, NP-C [facility] Pager: [phone] Office: [phone]  12/09/22

## 2022-12-09 NOTE — Assessment & Plan Note (Signed)
Greatly appreciate dermatology assistance - her appointment is this afternoon.

## 2022-12-09 NOTE — Patient Instructions (Addendum)
  If you have trouble with your bills again - [facility] will be able to help you with some of these.  You will be assigned a "Case Manager" - this is one person who you can reach out to for help with things like bills, housing, food. They are there to support what you need - just like me!  If you can bring a copy of the bill we can pass it along to your case manager to help.   Will see what the dermatologist thinks about your rash and itching today.   I would like to see you back in October to check in.     Si vous rencontrez  nouveau des [employer] vos factures, [facility] sera en mesure de vous aider avec certains d'entre eux.  Un  gestionnaire de cas  vous sera attribu : c'est une personne  qui vous pourrez vous adresser pour [employer] de [employer] des [employer] factures, Diplomatic Services operational officer, la nourriture. Ils sont l pour rpondre  vos besoins - tout comme moi !  Si vous pouvez apporter une copie de la facture, nous pouvons la transmettre  votre gestionnaire de cas pour [employer].   Je verrai ce que le dermatologue pense de votre ruption cutane et de vos dmangeaisons aujourd'hui.   J'aimerais vous revoir en octobre pour [employer] technologist.

## 2022-12-09 NOTE — Assessment & Plan Note (Signed)
Very well controlled on once daily Dovato. No access / tolerance concerns.  No changes to insurance coverage.  No dental needs today.  No concern over anxious/depressed mood.  Sexual health and family planning discussed - none needed today - needs pap smear sometime soon once other priorities are met.  Will need discussion around statin tx (I anticipate she will be leery about starting a new medicine since she worries one of her medications causes her rash). Needs mamography as well.   Return in about 4 months (around 04/10/2023).

## 2022-12-09 NOTE — Progress Notes (Signed)
   New Patient Visit   Subjective  Candace Jones is a 42 y.o. female who presents for the following: Pruritic Rash  Rash has been present since 2022. It has been consistent. The scaling went away but the itching is still present. Triamcinolone and hydroxyzine was given. The cream did help but skin is now dry and pt thinks the rash is coming back. Rash is present all over body with itchiness. Patient had a surgery in April of 2022. No hx of eczema.   The following portions of the chart were reviewed this encounter and updated as appropriate: medications, allergies, medical history  Review of Systems:  No other skin or systemic complaints except as noted in HPI or Assessment and Plan.  Objective  Well appearing patient in no apparent distress; mood and affect are within normal limits.  A focused examination was performed of the following areas: Arms, back, abdomen, legs  Relevant exam findings are noted in the Assessment and Plan.    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 Anemia (low hemoglobin and hematocrit) - Continue taking iron supplements as prescribed - Consult with a hematologist for possible iron infusion if oral iron supplements are not effective - Encourage the patient to discuss heavy menstrual bleeding with a gynecologist  3. Vitamin D deficiency - Continue taking prescribed vitamin D supplements - Monitor vitamin D levels and adjust supplementation as needed  4. Itching and sleep disturbance - Discontinue Hydroxyzine for itching - Allow  Hydroxyzine use at night for sleep if needed  5. Caffeine intake during Prednisone treatment - Advise the patient to switch to decaffeinated coffee while taking Prednisone  Follow-up: - Schedule a follow-up appointment in two months to assess the effectiveness of the treatment plan and discuss further options if needed. No follow-ups on file.  I, Germaine Pomfret, CMA, am acting as scribe for [employer], DO.   Documentation: I have reviewed the above documentation for accuracy and completeness, and I agree with the above.  Langston Reusing, DO

## 2022-12-09 NOTE — Patient Instructions (Signed)
Thank you for visiting my office today. I appreciate your commitment to improving your health and addressing the skin issues you've been experiencing.  Based on our discussion and examination, here are the key instructions and recommendations for your treatment plan:  -Diagnosis: Prurigo Nodularis  -After reviewing your labs and medical history, I think your severe itching is due to your persistent and severe anemia.  Please follow up with your PCP to discuss iron infusions if the tablets are not working.  - Medications Prescribed:   - Clobetasol Cream: Apply twice daily to the affected areas, morning and night, for two weeks. After two weeks, pause for two weeks before resuming if necessary.   - Prednisone Oral Tablets: Start with 40 mg daily for four days, then taper down as follows: 30 mg for four days, 20 mg for four days, and finally 10 mg for four days.   - CeraVe Anti-Itch Lotion (Pramoxine): Apply daily as needed for itch relief.   - Continue taking Hydroxyzine at night if it helps with sleep.  - Lifestyle Adjustments:   - Avoid hot showers as they can exacerbate itching.   - Consider switching to decaffeinated coffee while on Prednisone to avoid potential side effects like increased energy and sleep disturbances.  - Follow-Up Care:   - Please schedule a follow-up appointment in two months to assess the effectiveness of the treatment and discuss further options, such as Dupixent, if necessary.   - Consult with a gynecologist to address the underlying causes of your anemia and heavy menstrual bleeding.   - Discuss with your primary care doctor or a hematologist about the possibility of an iron infusion, given the persistent anemia despite taking iron supplements.  - Additional Notes:   - Ensure your pharmacy information is updated to receive your prescriptions without any issues.  Thank you once again for your visit today. Please follow the treatment plan as outlined and do not  hesitate to contact my office if you have any questions or concerns in the meantime.  Due to recent changes in healthcare laws, you may see results of your pathology and/or laboratory studies on MyChart before the doctors have had a chance to review them. We understand that in some cases there may be results that are confusing or concerning to you. Please understand that not all results are received at the same time and often the doctors may need to interpret multiple results in order to provide you with the best plan of care or course of treatment. Therefore, we ask that you please give [place] 2 business days to thoroughly review all your results before contacting the office for clarification. Should we see a critical lab result, you will be contacted sooner.   If You Need Anything After Your Visit  If you have any questions or concerns for your doctor, please call our main line at [phone] If no one answers, please leave a voicemail as directed and we will return your call as soon as possible. Messages left after 4 pm will be answered the following business day.   You may also send [place] a message via MyChart. We typically respond to MyChart messages within 1-2 business days.  For prescription refills, please ask your pharmacy to contact our office. Our fax number is [phone].  If you have an urgent issue when the clinic is closed that cannot wait until the next business day, you can page your doctor at the number below.    Please note that while  we do our best to be available for urgent issues outside of office hours, we are not available 24/7.   If you have an urgent issue and are unable to reach [place], you may choose to seek medical care at your doctor's office, retail clinic, urgent care center, or emergency room.  If you have a medical emergency, please immediately call 911 or go to the emergency department. In the event of inclement weather, please call our main line at [phone] for an  update on the status of any delays or closures.  Dermatology Medication Tips: Please keep the boxes that topical medications come in in order to help keep track of the instructions about where and how to use these. Pharmacies typically print the medication instructions only on the boxes and not directly on the medication tubes.   If your medication is too expensive, please contact our office at [phone] or send [place] a message through MyChart.   We are unable to tell what your co-pay for medications will be in advance as this is different depending on your insurance coverage. However, we may be able to find a substitute medication at lower cost or fill out paperwork to get insurance to cover a needed medication.   If a prior authorization is required to get your medication covered by your insurance company, please allow [place] 1-2 business days to complete this process.  Drug prices often vary depending on where the prescription is filled and some pharmacies may offer cheaper prices.  The website www.goodrx.com contains coupons for medications through different pharmacies. The prices here do not account for what the cost may be with help from insurance (it may be cheaper with your insurance), but the website can give you the price if you did not use any insurance.  - You can print the associated coupon and take it with your prescription to the pharmacy.  - You may also stop by our office during regular business hours and pick up a GoodRx coupon card.  - If you need your prescription sent electronically to a different pharmacy, notify our office through [facility] or by phone at [phone]

## 2022-12-09 NOTE — Assessment & Plan Note (Signed)
We need to get her case manager information to her - met with Johaura today and will coordinate contact with her CM team at [facility].

## 2022-12-10 LAB — T-HELPER CELLS (CD4) COUNT (NOT AT ARMC)
CD4 % Helper T Cell: 11 % — ABNORMAL LOW (ref 33–65)
CD4 T Cell Abs: 145 /uL — ABNORMAL LOW (ref 400–1790)

## 2022-12-11 LAB — HIV-1 RNA QUANT-NO REFLEX-BLD
HIV 1 RNA Quant: <20 Copies/mL — ABNORMAL HIGH
HIV-1 RNA Quant, Log: <1.3 Log cps/mL — ABNORMAL HIGH

## 2022-12-11 LAB — CBC
HCT: 36 % (ref 35.0–45.0)
Hemoglobin: 10.8 g/dL — ABNORMAL LOW (ref 11.7–15.5)
MCHC: 30 g/dL — ABNORMAL LOW (ref 32.0–36.0)
MCV: 79.1 fL — ABNORMAL LOW (ref 80.0–100.0)
Platelets: 154 10*3/uL (ref 140–400)
RDW: 21.9 % — ABNORMAL HIGH (ref 11.0–15.0)

## 2023-01-10 ENCOUNTER — Ambulatory Visit (INDEPENDENT_AMBULATORY_CARE_PROVIDER_SITE_OTHER): Payer: Medicaid Other | Admitting: Internal Medicine

## 2023-01-10 ENCOUNTER — Encounter: Payer: Self-pay | Admitting: Internal Medicine

## 2023-01-10 VITALS — BP 110/80 | HR 81 | Temp 97.8°F | Ht 68.0 in | Wt 208.2 lb

## 2023-01-10 DIAGNOSIS — M545 Low back pain, unspecified: Secondary | ICD-10-CM

## 2023-01-10 DIAGNOSIS — E559 Vitamin D deficiency, unspecified: Secondary | ICD-10-CM

## 2023-01-10 DIAGNOSIS — D509 Iron deficiency anemia, unspecified: Secondary | ICD-10-CM

## 2023-01-10 DIAGNOSIS — L299 Pruritus, unspecified: Secondary | ICD-10-CM

## 2023-01-10 DIAGNOSIS — K6289 Other specified diseases of anus and rectum: Secondary | ICD-10-CM | POA: Diagnosis not present

## 2023-01-10 DIAGNOSIS — R21 Rash and other nonspecific skin eruption: Secondary | ICD-10-CM

## 2023-01-10 LAB — COMPREHENSIVE METABOLIC PANEL
ALT: 12 U/L (ref 0–35)
AST: 17 U/L (ref 0–37)
Albumin: 3.7 g/dL (ref 3.5–5.2)
Alkaline Phosphatase: 67 U/L (ref 39–117)
BUN: 14 mg/dL (ref 6–23)
CO2: 25 mEq/L (ref 19–32)
Calcium: 8.8 mg/dL (ref 8.4–10.5)
Chloride: 105 mEq/L (ref 96–112)
Creatinine, Ser: 0.61 mg/dL (ref 0.40–1.20)
GFR: 110 mL/min (ref >60.00)
Glucose, Bld: 89 mg/dL (ref 70–99)
Potassium: 3.8 mEq/L (ref 3.5–5.1)
Sodium: 135 mEq/L (ref 135–145)
Total Bilirubin: 0.5 mg/dL (ref 0.2–1.2)
Total Protein: 7.7 g/dL (ref 6.0–8.3)

## 2023-01-10 LAB — CBC WITH DIFFERENTIAL/PLATELET
Basophils Absolute: 0 10*3/uL (ref 0.0–0.1)
Basophils Relative: 0.6 % (ref 0.0–3.0)
Eosinophils Absolute: 0.1 10*3/uL (ref 0.0–0.7)
Eosinophils Relative: 2.3 % (ref 0.0–5.0)
HCT: 34.7 % — ABNORMAL LOW (ref 36.0–46.0)
Hemoglobin: 10.8 g/dL — ABNORMAL LOW (ref 12.0–15.0)
Lymphocytes Relative: 48.3 % — ABNORMAL HIGH (ref 12.0–46.0)
Lymphs Abs: 1.8 10*3/uL (ref 0.7–4.0)
MCHC: 31 g/dL (ref 30.0–36.0)
MCV: 79.4 fl (ref 78.0–100.0)
Monocytes Absolute: 0.4 10*3/uL (ref 0.1–1.0)
Monocytes Relative: 11.6 % (ref 3.0–12.0)
Neutro Abs: 1.4 10*3/uL (ref 1.4–7.7)
Neutrophils Relative %: 37.2 % — ABNORMAL LOW (ref 43.0–77.0)
Platelets: 147 10*3/uL — ABNORMAL LOW (ref 150.0–400.0)
RBC: 4.37 Mil/uL (ref 3.87–5.11)
RDW: 15.1 % (ref 11.5–15.5)
WBC: 3.7 10*3/uL — ABNORMAL LOW (ref 4.0–10.5)

## 2023-01-10 LAB — POC URINALSYSI DIPSTICK (AUTOMATED)
Bilirubin, UA: NEGATIVE
Blood, UA: POSITIVE
Glucose, UA: NEGATIVE
Ketones, UA: NEGATIVE
Leukocytes, UA: NEGATIVE
Nitrite, UA: NEGATIVE
Protein, UA: POSITIVE — AB
Spec Grav, UA: 1.02 (ref 1.010–1.025)
Urobilinogen, UA: 0.2 E.U./dL
pH, UA: 6.5 (ref 5.0–8.0)

## 2023-01-10 LAB — POCT URINE PREGNANCY: Preg Test, Ur: NEGATIVE

## 2023-01-10 MED ORDER — VITAMIN D3 50 MCG (2000 UT) PO CAPS
2000.0000 [IU] | ORAL_CAPSULE | Freq: Every day | ORAL | 1 refills | Status: AC
Start: 2023-01-10 — End: ?

## 2023-01-10 MED ORDER — TRIAMCINOLONE ACETONIDE 0.5 % EX OINT
TOPICAL_OINTMENT | CUTANEOUS | 11 refills | Status: DC
Start: 2023-01-10 — End: 2023-04-05

## 2023-01-10 MED ORDER — HYDROXYZINE PAMOATE 25 MG PO CAPS
25.0000 mg | ORAL_CAPSULE | Freq: Three times a day (TID) | ORAL | 1 refills | Status: AC | PRN
Start: 2023-01-10 — End: ?

## 2023-01-10 MED ORDER — FERROUS SULFATE 325 (65 FE) MG PO TABS
325.0000 mg | ORAL_TABLET | Freq: Every day | ORAL | 1 refills | Status: DC
Start: 2023-01-10 — End: 2023-11-10

## 2023-01-10 NOTE — Progress Notes (Signed)
[facility] [address] [place] [address] Dept: [phone] Dept Fax: [phone]  Acute Care Office Visit  Subjective:   Candace Jones [facility] 04/13/1981 01/10/2023  Chief Complaint  Patient presents with   Abdominal Pain    Pelvic area pain 3 days    Due to language barrier, a medical interpreter was present during the HPI, ROS, and discussion for the plan of care.  InterpreterOrson Slick [phone]   HPI:  Candace Jones is a 42 year old [name] speaking patient with history of HIV, IDA, Vit D deficiency, uterine fibroids, perirectal abscess,  who presents complaining of lower back pain and rectal pain onset 3 days ago.  She states the pain is "in side her body."  She reports it is the same pain that she had when she had a perirectal abscess, which required surgery and drain placement.  She reports with this pain she is now having drainage through her vagina.  The "drainage" is described as a fluid with yellowish color and mixed with a tinge of blood.  She states that she normally has to change her panty liner 3 times by 12 PM.  She denies fever, chills, abdominal pain, nausea, vomiting, dysuria, vaginal bleeding, or a thick vaginal discharge.  She denies any recent strenuous physical activity or lifting heavy objects.  She is not sexually active.    She is requesting refills on chronic medications.   The following portions of the patient's history were reviewed and updated as appropriate: past medical history, past surgical history, family history, social history, allergies, medications, and problem list.   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   Asymptomatic HIV infection, CD4 >200 and <500 (HCC)    Fibroid uterus    Pelvic pain    Seizure (HCC) 09/29/2020   Iron deficiency anemia 09/29/2020   Past Medical History:  Diagnosis Date   AIDS (acquired immune deficiency syndrome) (HCC)    Blood transfusion without reported diagnosis    Hydronephrosis    Hyperkalemia 11/05/2020   Perirectal abscess    Perirectal abscess 09/02/2021   Seizures (HCC)    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   Urinary incontinence 09/02/2021   Past Surgical History:  Procedure Laterality Date   APPLICATION OF CRANIAL NAVIGATION Left 10/02/2020   Procedure: APPLICATION OF CRANIAL NAVIGATION;  Surgeon: Tressie Stalker, MD;  Location: [facility] OR;  Service: Neurosurgery;  Laterality: Left;   buttock surgery     CRANIOTOMY Left 10/02/2020   Procedure: LEFT CRANIOTOMY FOR TUMOR BIOPSY/ RESECTION with BrainLab;  Surgeon: Tressie Stalker, MD;  Location: [facility] OR;  Service: Neurosurgery;  Laterality: Left;   IR RADIOLOGIST EVAL & MGMT  10/17/2020   IR RADIOLOGIST EVAL & MGMT  12/02/2020   Family History  Problem Relation Age of Onset   Breast cancer Neg Hx    Outpatient Medications Prior to Visit  Medication Sig Dispense Refill   dolutegravir-lamiVUDine (DOVATO) 50-300 MG tablet Take 1 tablet by mouth daily. 30 tablet 11   Cholecalciferol (VITAMIN D3) 50 MCG (2000 UT) capsule  Take 1 capsule (2,000 Units total) by mouth daily. 90 capsule 1   clobetasol cream (TEMOVATE) 0.05 % Apply 1 Application topically in the morning and at bedtime. Apply to affected areas twice a day for two weeks 30 g 0   fluticasone (FLONASE) 50 MCG/ACT nasal spray Place 2 sprays into both nostrils daily. (Patient not taking: Reported on 10/26/2022) 16 g 6   hydrOXYzine (ATARAX) 25 MG tablet Take 1 tablet (25 mg total) by mouth every  6 (six) hours. 60 tablet 1   Olopatadine HCl (PATADAY) 0.7 % SOLN Use 1 drop in each eye daily 2.5 mL 0   ferrous sulfate 325 (65 FE) MG tablet Take 1 tablet (325 mg total) by mouth daily. 30 tablet 1   triamcinolone ointment (KENALOG) 0.5 % APPLY TOPICALLY TO THE AFFECTED AREA TWICE DAILY (Patient not taking: Reported on 10/18/2022) 30 g 11   No facility-administered medications prior to visit.   Allergies  Allergen Reactions   Tenofovir Other (See Comments)    Patient started on Biktarvy - acute renal failure shortly after with creatinine 5.2. Unclear if related to TAF but would approach with caution if re-trialed.      ROS: A complete ROS was performed with pertinent positives/negatives noted in the HPI. The remainder of the ROS are negative.    Objective:   Today's Vitals   01/10/23 0936  BP: 110/80  Pulse: 81  Temp: 97.8 F (36.6 C)  TempSrc: Temporal  SpO2: 98%  Weight: 208 lb 3.2 oz (94.4 kg)  Height: 5\' 8"  (1.727 m)    GENERAL: Well-appearing, in NAD. Well nourished.  SKIN: Pink, warm and dry.   NECK: Trachea midline. Full ROM w/o pain or tenderness. No lymphadenopathy.  RESPIRATORY: Chest wall symmetrical. Respirations even and non-labored. Breath sounds clear to auscultation bilaterally.  GI: Abdomen soft, non-tender. Normoactive bowel sounds. No rebound tenderness. No hepatomegaly or splenomegaly. No CVA tenderness.  GU: External genitalia without erythema, lesions, or masses. No lymphadenopathy. Vaginal mucosa pink and moist without exudate, lesions, or ulcerations. Rectal exam: external hemorrhoids noted, raised cyst/abscess like structure directly above intergluteal fold, near tailbone. No palpable firm mass surrounding rectal area.  CARDIAC: S1, S2 present, regular rate and rhythm. Peripheral pulses 2+ bilaterally.  MSK: Muscle tone and strength appropriate for age.  EXTREMITIES: Without clubbing, cyanosis, or edema.  NEUROLOGIC: No motor or sensory deficits.  Steady, even gait.  PSYCH/MENTAL STATUS: Alert, oriented x 3. Cooperative, appropriate mood and affect.   Chaperoned by Mary Sella, CMA  Results for orders placed or performed in visit on 01/10/23  POCT Urinalysis Dipstick (Automated)  Result Value Ref Range   Color, UA yellow    Clarity, UA clear    Glucose, UA Negative Negative   Bilirubin, UA neg    Ketones, UA neg    Spec Grav, UA 1.020 1.010 - 1.025   Blood, UA positive    pH, UA 6.5 5.0 - 8.0   Protein, UA Positive (A) Negative   Urobilinogen, UA 0.2 0.2 or 1.0 E.U./dL   Nitrite, UA neg    Leukocytes, UA Negative Negative  POCT urine pregnancy  Result Value Ref Range   Preg Test, Ur Negative Negative      Assessment & Plan:  1. Acute bilateral low back pain without sciatica - POCT Urinalysis Dipstick (Automated) - POCT urine pregnancy - Urine Culture - CT ABDOMEN PELVIS W CONTRAST; Future  2. Rectal pain - CBC with Differential/Platelet - Comp Met (CMET) - CT  ABDOMEN PELVIS W CONTRAST; Future  3. Iron deficiency anemia, unspecified iron deficiency anemia type - ferrous sulfate 325 (65 FE) MG tablet; Take 1 tablet (325 mg total) by mouth daily.  Dispense: 90 tablet; Refill: 1  4. Vitamin D insufficiency - Cholecalciferol (VITAMIN D3) 50 MCG (2000 UT) capsule; Take 1 capsule (2,000 Units total) by mouth daily.  Dispense: 90 capsule; Refill: 1  5. Itching - hydrOXYzine (VISTARIL) 25 MG capsule; Take 1 capsule (25 mg total) by mouth every 8 (eight) hours as needed.  Dispense: 30 capsule; Refill: 1  6. Rash - triamcinolone ointment (KENALOG) 0.5 %; APPLY TOPICALLY TO THE AFFECTED AREA TWICE DAILY  Dispense: 30 g; Refill: 11     Meds ordered this encounter  Medications   Cholecalciferol (VITAMIN D3) 50 MCG (2000 UT) capsule    Sig: Take 1 capsule (2,000 Units total) by mouth daily.    Dispense:  90 capsule    Refill:  1   ferrous sulfate 325 (65 FE) MG tablet    Sig: Take 1 tablet (325 mg total) by mouth  daily.    Dispense:  90 tablet    Refill:  1   hydrOXYzine (VISTARIL) 25 MG capsule    Sig: Take 1 capsule (25 mg total) by mouth every 8 (eight) hours as needed.    Dispense:  30 capsule    Refill:  1   triamcinolone ointment (KENALOG) 0.5 %    Sig: APPLY TOPICALLY TO THE AFFECTED AREA TWICE DAILY    Dispense:  30 g    Refill:  11    Can she get the largest tube/tub available?   Lab Orders         Urine Culture         CBC with Differential/Platelet         Comp Met (CMET)         POCT Urinalysis Dipstick (Automated)         POCT urine pregnancy     No images are attached to the encounter or orders placed in the encounter.  Return if symptoms worsen or fail to improve.   A total of 40 minutes were spent on this encounter today. When total time is documented, this includes both the face-to-face and non-face-to-face time personally spent before, during and after the visit on the date of the encounter.  I reviewed patient's CT abdomen pelvis and MRI results from 10/02/2020, 10/18/2018 to 11/05/2020.  IR chart from 10/17/2020, infectious disease note concerning perirectal abscess on 08/12/2021, and her hospitalization in September 29, 2020.    Of note, portions of this note may have been created with voice recognition software [employer], medical). While this note has been edited for accuracy, occasional wrong-word or 'sound-a-like' substitutions may have occurred due to the inherent limitations of voice recognition software.  Salvatore Decent, FNP

## 2023-01-17 ENCOUNTER — Other Ambulatory Visit: Payer: Self-pay | Admitting: Infectious Diseases

## 2023-01-17 ENCOUNTER — Other Ambulatory Visit: Payer: Self-pay | Admitting: Neurology

## 2023-01-17 DIAGNOSIS — B582 Toxoplasma meningoencephalitis: Secondary | ICD-10-CM

## 2023-02-03 ENCOUNTER — Ambulatory Visit (INDEPENDENT_AMBULATORY_CARE_PROVIDER_SITE_OTHER): Payer: Medicaid Other | Admitting: Dermatology

## 2023-02-03 VITALS — BP 122/81

## 2023-02-03 DIAGNOSIS — L281 Prurigo nodularis: Secondary | ICD-10-CM

## 2023-02-03 DIAGNOSIS — L299 Pruritus, unspecified: Secondary | ICD-10-CM

## 2023-02-03 MED ORDER — TRIAMCINOLONE ACETONIDE 40 MG/ML IJ SUSP
40.0000 mg | Freq: Once | INTRAMUSCULAR | Status: AC
Start: 2023-02-03 — End: 2023-02-03
  Administered 2023-02-03: 40 mg via INTRAMUSCULAR

## 2023-02-03 MED ORDER — DUPIXENT 300 MG/2ML ~~LOC~~ SOAJ: 300.0000 mg | SUBCUTANEOUS | 11 refills | Status: DC

## 2023-02-03 MED ORDER — DUPIXENT 300 MG/2ML ~~LOC~~ SOAJ: 600.0000 mg | Freq: Once | SUBCUTANEOUS | 0 refills | Status: AC

## 2023-02-03 MED ORDER — DUPIXENT 300 MG/2ML ~~LOC~~ SOAJ: 300.0000 mg | SUBCUTANEOUS | 11 refills | Status: AC

## 2023-02-03 MED ORDER — TACROLIMUS 0.1 % EX OINT: TOPICAL_OINTMENT | Freq: Two times a day (BID) | CUTANEOUS | 3 refills | Status: AC

## 2023-02-03 NOTE — Progress Notes (Signed)
   Follow-Up Visit   Subjective  Candace Jones is a 42 y.o. female who presents for the following: Prurigo Nodularis on the arms, legs, back, shoulders. LOV 12/09/2022. She used Clobetsaol cream every day until it was finished. She took Prednisone. She says the cream made it worse. It is a 10 out 10 for itch..She is also using Cerave anti-itch lotion. She is not taking Hydroxyzine   The following portions of the chart were reviewed this encounter and updated as appropriate:      Review of Systems: No other skin or systemic complaints.  Objective  Well appearing patient in no apparent distress; mood and affect are within normal limits.             1. Prurigo Nodularis Hyperpigmented pruritic nodules scattered over bilateral upper and lower extremities.  - Assessment:   Discontinuation of prednisone (pt only took 10mg  PO daily and did not follow the taper), clobetasol (made skin worse), and hydroxyzine due to lack of improvement and adverse effects. - Plan:   -Administer intramuscular Kenalog 40 injection (40 mg, 1 cc) for immediate relief.   -Initiate Dupixent (dupilumab) 300 mg every two weeks once approved by insurance for long-term treatment.   -Prescribe Tacrolimus cream, to be applied twice daily until follow-up visit.   -Provide samples of CeraVe Anti-Itch lotion for symptomatic relief; instruct patient to purchase a larger bottle at a local store. Continue using Triamcinolone cream as it provides some relief.   Jaclynn Guarneri, CMA, am acting as scribe for [employer], DO.

## 2023-02-03 NOTE — Patient Instructions (Signed)
Hello Ms. Minckler,  Thank you for visiting the clinic today. Your dedication to addressing your prurigo nodularis and exploring treatment options is greatly appreciated. Below is a summary of our discussion and the outlined treatment plan moving forward:  - Immediate Relief: An intramuscular injection of Kenalog 40 was administered today for immediate itch relief.  - Prescription Changes:   - Discontinue the use of clobetasol cream due to adverse reactions.   - Begin using Tacrolimus cream, applying it twice daily. This prescription has been sent to your pharmacy.   - Continue using CeraVe Anti-Itch lotion as needed for symptomatic relief.  - Long-term Treatment Plan:   - We are initiating Dupixent injections, pending insurance approval. This medication, administered every two weeks, is expected to significantly relieve deep-seated itching.   - Upon Dupixent's approval, you will receive instructions for self-administration using a pen device. The medication will be mailed to your home for ongoing treatment.  - Follow-Up: We will reach out to you in approximately three weeks regarding Dupixent's approval status. Please ensure your MyChart is activated for efficient communication.  We are committed to identifying the most effective treatment for your condition and are hopeful for significant improvements in your symptoms. Should you have any questions or concerns before our next communication, please do not hesitate to contact our office.  Sincerely,  Dr. [facility]

## 2023-02-07 ENCOUNTER — Other Ambulatory Visit: Payer: Self-pay

## 2023-02-07 ENCOUNTER — Other Ambulatory Visit (HOSPITAL_COMMUNITY): Payer: Self-pay

## 2023-02-07 MED ORDER — PIMECROLIMUS 1 % EX CREA
TOPICAL_CREAM | Freq: Two times a day (BID) | CUTANEOUS | 6 refills | Status: DC
  Filled 2023-02-07: qty 60, 30d supply, fill #0

## 2023-02-07 NOTE — Progress Notes (Signed)
Tacrolimus not covered by insurance so sent in pimecrolimus in for him

## 2023-02-08 ENCOUNTER — Other Ambulatory Visit (HOSPITAL_COMMUNITY): Payer: Self-pay

## 2023-02-09 ENCOUNTER — Other Ambulatory Visit: Payer: Medicaid Other

## 2023-02-18 ENCOUNTER — Other Ambulatory Visit (HOSPITAL_COMMUNITY): Payer: Self-pay

## 2023-03-07 ENCOUNTER — Encounter: Payer: Self-pay | Admitting: Dermatology

## 2023-03-07 ENCOUNTER — Ambulatory Visit: Payer: Medicaid Other | Admitting: Dermatology

## 2023-03-07 DIAGNOSIS — L280 Lichen simplex chronicus: Secondary | ICD-10-CM

## 2023-03-07 DIAGNOSIS — L281 Prurigo nodularis: Secondary | ICD-10-CM

## 2023-03-07 DIAGNOSIS — L299 Pruritus, unspecified: Secondary | ICD-10-CM

## 2023-03-07 MED ORDER — PIMECROLIMUS 1 % EX CREA: TOPICAL_CREAM | Freq: Two times a day (BID) | CUTANEOUS | 2 refills | Status: AC

## 2023-03-07 MED ORDER — CLOBETASOL PROPIONATE 0.05 % EX CREA: 1.0000 | TOPICAL_CREAM | Freq: Two times a day (BID) | CUTANEOUS | 1 refills | Status: DC

## 2023-03-07 NOTE — Progress Notes (Signed)
   Follow-Up Visit   Subjective  Candace Jones is a 42 y.o. female who presents for the following: prurigo nodules at arms, legs, back and shoulders. Patient was started on Dupixent at last visit and advises no change.  Patient was given tacrolimus and clobetasol to alternate every 2 weeks with.     The following portions of the chart were reviewed this encounter and updated as appropriate: medications, allergies, medical history  Review of Systems:  No other skin or systemic complaints except as noted in HPI or Assessment and Plan.  Objective  Well appearing patient in no apparent distress; mood and affect are within normal limits.  A focused examination was performed of the following areas: Arms, legs, trunk  Relevant exam findings are noted in the Assessment and Plan.    Assessment & Plan   PRURIGO NODULARIS/LICHEN SIMPLEX CHRONICUS Exam: Excoriated papules and nodules at arms, legs, buttocks   flared   Lichen simplex chronicus (LSC) is a persistent itchy area of thickened skin that is induced by chronic rubbing and/or scratching (chronic dermatitis).  These areas may be pink, hyperpigmented and may have excoriations and bumps (prurigo nodules-PN).  PN/LSC is commonly observed in uncontrolled atopic dermatitis and other forms of eczema, and in other itchy skin conditions (eg, insect bites, scabies).  Sometimes it is not possible to know initial cause of PN/LSC if it has been present for a long time.  It generally responds well to treatment with high potency topical steroids.  It is important to stop rubbing/scratching the area in order to break the itch-scratch-rash-itch cycle, in order for the rash to resolve.   Treatment Plan: Patient advised pending approval, we will do loading dose of Dupixent and then she will have injections sent to her home to self-inject.  Continue clobetasol cream twice daily x 2 weeks alternating with pimecrolimus twice daily x 2 weeks. Avoid  applying clobetasol to face, groin, and axilla. Use as directed. Long-term use can cause thinning of the skin.   Avoid picking/rubbing/scratching   Topical steroids (such as triamcinolone, fluocinolone, fluocinonide, mometasone, clobetasol, halobetasol, betamethasone, hydrocortisone) can cause thinning and lightening of the skin if they are used for too long in the same area. Your physician has selected the right strength medicine for your problem and area affected on the body. Please use your medication only as directed by your physician to prevent side effects.    Return for will call to schedule pending Dupixent approval.  Anise Salvo, RMA, am acting as scribe for Langston Reusing, DO .   Documentation: I have reviewed the above documentation for accuracy and completeness, and I agree with the above.  Langston Reusing, DO

## 2023-03-07 NOTE — Patient Instructions (Addendum)
We will call when we get approval for Dupixent. In the meantime continue applying clobetasol cream twice daily for 2 weeks to itchy areas. Then switch to pimecrolimus twice daily for 2 weeks. Continue alternating every 2 weeks. It should help with the itching but will not take it completely away.   Important Information  Due to recent changes in healthcare laws, you may see results of your pathology and/or laboratory studies on MyChart before the doctors have had a chance to review them. We understand that in some cases there may be results that are confusing or concerning to you. Please understand that not all results are received at the same time and often the doctors may need to interpret multiple results in order to provide you with the best plan of care or course of treatment. Therefore, we ask that you please give [place] 2 business days to thoroughly review all your results before contacting the office for clarification. Should we see a critical lab result, you will be contacted sooner.   If You Need Anything After Your Visit  If you have any questions or concerns for your doctor, please call our main line at [phone] If no one answers, please leave a voicemail as directed and we will return your call as soon as possible. Messages left after 4 pm will be answered the following business day.   You may also send [place] a message via MyChart. We typically respond to MyChart messages within 1-2 business days.  For prescription refills, please ask your pharmacy to contact our office. Our fax number is [phone].  If you have an urgent issue when the clinic is closed that cannot wait until the next business day, you can page your doctor at the number below.    Please note that while we do our best to be available for urgent issues outside of office hours, we are not available 24/7.   If you have an urgent issue and are unable to reach [place], you may choose to seek medical care at your doctor's  office, retail clinic, urgent care center, or emergency room.  If you have a medical emergency, please immediately call 911 or go to the emergency department. In the event of inclement weather, please call our main line at [phone] for an update on the status of any delays or closures.  Dermatology Medication Tips: Please keep the boxes that topical medications come in in order to help keep track of the instructions about where and how to use these. Pharmacies typically print the medication instructions only on the boxes and not directly on the medication tubes.   If your medication is too expensive, please contact our office at [phone] or send [place] a message through MyChart.   We are unable to tell what your co-pay for medications will be in advance as this is different depending on your insurance coverage. However, we may be able to find a substitute medication at lower cost or fill out paperwork to get insurance to cover a needed medication.   If a prior authorization is required to get your medication covered by your insurance company, please allow [place] 1-2 business days to complete this process.  Drug prices often vary depending on where the prescription is filled and some pharmacies may offer cheaper prices.  The website www.goodrx.com contains coupons for medications through different pharmacies. The prices here do not account for what the cost may be with help from insurance (it may be cheaper with your insurance), but the  website can give you the price if you did not use any insurance.  - You can print the associated coupon and take it with your prescription to the pharmacy.  - You may also stop by our office during regular business hours and pick up a GoodRx coupon card.  - If you need your prescription sent electronically to a different pharmacy, notify our office through [facility] or by phone at [phone]

## 2023-03-10 ENCOUNTER — Emergency Department (HOSPITAL_COMMUNITY)
Admission: EM | Admit: 2023-03-10 | Discharge: 2023-03-10 | Disposition: A | Discharge status: Home / Self Care | Payer: Medicaid Other | Attending: Emergency Medicine | Admitting: Emergency Medicine

## 2023-03-10 ENCOUNTER — Encounter (HOSPITAL_COMMUNITY): Payer: Self-pay

## 2023-03-10 ENCOUNTER — Other Ambulatory Visit: Payer: Self-pay

## 2023-03-10 DIAGNOSIS — L299 Pruritus, unspecified: Secondary | ICD-10-CM | POA: Insufficient documentation

## 2023-03-10 DIAGNOSIS — T783XXA Angioneurotic edema, initial encounter: Secondary | ICD-10-CM | POA: Insufficient documentation

## 2023-03-10 DIAGNOSIS — T7840XA Allergy, unspecified, initial encounter: Secondary | ICD-10-CM | POA: Insufficient documentation

## 2023-03-10 MED ORDER — FAMOTIDINE 20 MG PO TABS
20.0000 mg | ORAL_TABLET | Freq: Once | ORAL | Status: AC
  Administered 2023-03-10: 20 mg via ORAL
  Filled 2023-03-10: qty 1

## 2023-03-10 MED ORDER — EPINEPHRINE 0.3 MG/0.3ML IJ SOAJ: 0.3000 mg | Freq: Once | INTRAMUSCULAR | Status: DC

## 2023-03-10 MED ORDER — PREDNISONE 20 MG PO TABS: ORAL_TABLET | ORAL | 0 refills | Status: DC

## 2023-03-10 MED ORDER — DIPHENHYDRAMINE HCL 25 MG PO CAPS
50.0000 mg | ORAL_CAPSULE | Freq: Once | ORAL | Status: AC
  Administered 2023-03-10: 50 mg via ORAL
  Filled 2023-03-10: qty 2

## 2023-03-10 MED ORDER — DIPHENHYDRAMINE HCL 50 MG/ML IJ SOLN: 25.0000 mg | Freq: Once | INTRAMUSCULAR | Status: DC

## 2023-03-10 MED ORDER — DEXAMETHASONE SODIUM PHOSPHATE 10 MG/ML IJ SOLN: 10.0000 mg | Freq: Once | INTRAMUSCULAR | Status: DC

## 2023-03-10 MED ORDER — FAMOTIDINE IN NACL 20-0.9 MG/50ML-% IV SOLN: 20.0000 mg | Freq: Once | INTRAVENOUS | Status: DC

## 2023-03-10 MED ORDER — EPINEPHRINE 0.3 MG/0.3ML IJ SOAJ: 0.3000 mg | INTRAMUSCULAR | 0 refills | Status: AC | PRN

## 2023-03-10 MED ORDER — PREDNISONE 20 MG PO TABS
60.0000 mg | ORAL_TABLET | Freq: Once | ORAL | Status: AC
  Administered 2023-03-10: 60 mg via ORAL
  Filled 2023-03-10: qty 3

## 2023-03-10 NOTE — ED Notes (Signed)
Went over discharged instructions to patient, educated patient on the use of epi pen that was prescribed and new medications. Pt. Verbalized understanding and teach back method was used. V/s stable and airway intact upon departure. Pt. Was ambulatory and left by self upon discharge. Translator used for entire interaction.

## 2023-03-10 NOTE — ED Provider Notes (Signed)
[place] EMERGENCY DEPARTMENT AT [facility] Provider Note   CSN: [id number] Arrival date & time: 03/10/23  4098     History  Chief Complaint  Patient presents with   Allergic Reaction    Candace Jones is a 42 y.o. female.  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  History is obtained through a [name] language interpreter.  Patient reports that she woke up this morning about 530 or 6 AM and noticed some swelling to her lips.  She says it feels swollen in her tongue and around the lower part of her face.  She says it itchy.  She says that her face is itchy as well.  She also has itchiness on her arms and legs.  She has not noticed a rash however.  She denies any new exposures.  No new medications.  No history of similar events in the past.  She does say that she recently has seen a dermatologist who gave her an injection about a month ago but she does not know what it was.  On chart review, she was seen by dermatology and diagnosed with Prurigo nodularis.  She was given injection of Kenalog and was trying to get approval for Dupixent.       Home Medications Prior to Admission medications   Medication Sig Start Date End Date Taking? Authorizing Provider  EPINEPHrine 0.3 mg/0.3 mL IJ SOAJ injection Inject 0.3 mg into the muscle as needed for anaphylaxis. 03/10/23  Yes Rolan Bucco, MD  predniSONE (DELTASONE) 20 MG tablet 2 tabs po daily x 4 days 03/10/23  Yes Rolan Bucco, MD  Cholecalciferol (VITAMIN D3) 50 MCG (2000 UT) capsule Take 1 capsule (2,000 Units total) by mouth daily. 01/10/23   Salvatore Decent, FNP  clobetasol cream (TEMOVATE) 0.05 % Apply 1 Application topically in the morning and at bedtime. Apply to affected areas twice a day for two weeks alternating with pimecrolimus. Avoid applying to face, groin, and axilla. Use as directed. Long-term use can cause thinning of the skin. 03/07/23   Terri Piedra, DO  dolutegravir-lamiVUDine (DOVATO) 50-300 MG tablet Take 1 tablet by mouth daily. 10/14/22   Blanchard Kelch, NP  Dupilumab (DUPIXENT) 300 MG/2ML SOPN Inject 300 mg into the skin every 14 (fourteen) days. Starting at day 15 for maintenance. 02/03/23   Terri Piedra, DO  ferrous sulfate 325 (65 FE) MG tablet Take 1 tablet (325 mg total) by mouth daily. 01/10/23   Salvatore Decent, FNP  fluticasone (FLONASE) 50 MCG/ACT nasal spray Place 2 sprays into both nostrils daily. Patient not taking: Reported on 10/26/2022 06/21/22   Garnette Gunner, MD  hydrOXYzine (ATARAX) 25 MG tablet Take 1 tablet (25 mg total) by mouth every 6 (six) hours. 10/14/22   Blanchard Kelch, NP  hydrOXYzine (VISTARIL) 25 MG capsule Take 1 capsule (25 mg total) by mouth every 8 (eight) hours as needed. 01/10/23   Salvatore Decent, FNP  Multiple Vitamins-Minerals (ONE DAILY MULTIVITAMIN WOMEN) TABS TAKE 1 TABLET BY MOUTH DAILY 01/17/23 04/17/23  Windell Norfolk, MD  Olopatadine HCl (PATADAY) 0.7 % SOLN Use 1 drop in each eye daily 10/26/22   McElwee, Lauren A, NP  pimecrolimus (ELIDEL) 1 % cream Apply topically 2 (two) times daily. For 2 weeks, alternating every 2 weeks with clobetasol 03/07/23   Terri Piedra, DO  tacrolimus (PROTOPIC) 0.1 % ointment Apply topically 2 (two) times daily. Apply to affected areas  twice daily 02/03/23   Terri Piedra, DO  triamcinolone ointment (KENALOG) 0.5 % APPLY TOPICALLY TO THE AFFECTED AREA TWICE DAILY 01/10/23   Salvatore Decent, FNP      Allergies    Tenofovir    Review of Systems   Review of Systems  Constitutional:  Negative for chills, diaphoresis, fatigue and fever.  HENT:  Positive for facial swelling. Negative for congestion, rhinorrhea and sneezing.   Eyes: Negative.   Respiratory:  Negative for cough, chest tightness and shortness of breath.   Cardiovascular:  Negative for chest pain and leg swelling.  Gastrointestinal:  Negative for abdominal pain, blood in stool, diarrhea,  nausea and vomiting.  Genitourinary:  Negative for difficulty urinating, flank pain, frequency and hematuria.  Musculoskeletal:  Negative for arthralgias and back pain.  Skin:  Negative for rash (Itching).  Neurological:  Negative for dizziness, speech difficulty, weakness, numbness and headaches.    Physical Exam Updated Vital Signs BP 125/81   Pulse 81   Temp 97.7 F (36.5 C) (Oral)   Resp 16   Ht 5\' 8"  (1.727 m)   Wt 94.4 kg   SpO2 100%   BMI 31.64 kg/m  Physical Exam Constitutional:      Appearance: She is well-developed.  HENT:     Head: Normocephalic and atraumatic.     Mouth/Throat:     Comments: No angioedema noted Eyes:     Pupils: Pupils are equal, round, and reactive to light.  Cardiovascular:     Rate and Rhythm: Normal rate and regular rhythm.     Heart sounds: Normal heart sounds.  Pulmonary:     Effort: Pulmonary effort is normal. No respiratory distress.     Breath sounds: Normal breath sounds. No wheezing or rales.  Chest:     Chest wall: No tenderness.  Abdominal:     General: Bowel sounds are normal.     Palpations: Abdomen is soft.     Tenderness: There is no abdominal tenderness. There is no guarding or rebound.  Musculoskeletal:        General: Normal range of motion.     Cervical back: Normal range of motion and neck supple.     Comments: There is some circular dark spots on her extremities.  They are flat.  No urticaria noted.  No petechiae or purpura.  No vesicles.  Lymphadenopathy:     Cervical: No cervical adenopathy.  Skin:    General: Skin is warm and dry.     Findings: No rash.  Neurological:     Mental Status: She is alert and oriented to person, place, and time.     ED Results / Procedures / Treatments   Labs (all labs ordered are listed, but only abnormal results are displayed) Labs Reviewed - No data to display  EKG None  Radiology No results found.  Procedures Procedures    Medications Ordered in ED Medications   predniSONE (DELTASONE) tablet 60 mg (60 mg Oral Given 03/10/23 0950)  famotidine (PEPCID) tablet 20 mg (20 mg Oral Given 03/10/23 0949)  diphenhydrAMINE (BENADRYL) capsule 50 mg (50 mg Oral Given 03/10/23 0950)    ED Course/ Medical Decision Making/ A&P                                 Medical Decision Making Amount and/or Complexity of Data Reviewed External Data Reviewed: notes. Labs: ordered.    Details: Patient refused  Risk Prescription drug management. Decision regarding hospitalization.   Patient is a 42 year old female who presents with swelling to her lips and she says her tongue although I cannot visualize any tongue swelling.  She does have some swelling to primarily her lower lip.  She has itching but no rash.  Initially given the tongue swelling, I did order IV medications including Decadron, Benadryl, Pepcid and IM epi.  However she refused these.  She initially refused oral medications as well but then she did take them.  She was monitored in the ED for several hours and she said the itching has resolved.  She does not report any tongue swelling.  She still feels like her lips are swelling although she says she is ready to leave and does not want to stay any longer.  She was discharged home in good condition.  She was given prescriptions for a 4-day course of prednisone to start tomorrow.  She was advised to use Benadryl as needed for itching and was given a prescription for an EpiPen and advised on when to use it.  She was given discharge instructions on how to use it.  Instructions were given through the video language line.  She was advised to follow-up with her primary care provider.  Return precautions were given.  Final Clinical Impression(s) / ED Diagnoses Final diagnoses:  Allergic reaction, initial encounter  Angioedema, initial encounter    Rx / DC Orders ED Discharge Orders          Ordered    EPINEPHrine 0.3 mg/0.3 mL IJ SOAJ injection  As needed         03/10/23 1149    predniSONE (DELTASONE) 20 MG tablet        03/10/23 1149              Rolan Bucco, MD 03/10/23 1156

## 2023-03-10 NOTE — ED Notes (Signed)
Patient refused IV medication, provider changed med to PO. Patient was hesitant when administering meds due to not eating, once I offered her food she declined. I asked her what was her goals for being in hospital. She said she was primarily concerned about taking any meds due to going into renal failure due to taking a medication. Checked with patient and chart and ensured patient she was not taking "Tenofovir" which is what resulted into her prior renal failure. Patient decided to end up taking medication, she had a little trouble swallowing, but everything went down. Translator used during whole encounter. Will continue to monitor patient.

## 2023-03-10 NOTE — ED Notes (Signed)
RN attempted to start tx but pt declined. Pt states her lip is swollen not anything else. RN informed pt we are treating the pt with IV meds d/t the risk of her throat potentially swelling. Pt states she doesn't want an IV and prefer oral meds.   RN notified EDP.  EDP at bedside discussing care.

## 2023-03-10 NOTE — ED Notes (Signed)
Pt agreed to oral medication and lab work.

## 2023-03-10 NOTE — ED Triage Notes (Signed)
Pt c/o allergic reaction, but unsure to what. Pt denies new meds or new product use. Pt c/o lips are itching and swollen started last night. PT is eupneic.

## 2023-04-05 ENCOUNTER — Other Ambulatory Visit: Payer: Self-pay

## 2023-04-05 ENCOUNTER — Ambulatory Visit: Payer: Medicaid Other | Admitting: Infectious Diseases

## 2023-04-05 VITALS — BP 108/73 | HR 81 | Ht 68.0 in | Wt 205.0 lb

## 2023-04-05 DIAGNOSIS — L281 Prurigo nodularis: Secondary | ICD-10-CM | POA: Diagnosis not present

## 2023-04-05 DIAGNOSIS — R21 Rash and other nonspecific skin eruption: Secondary | ICD-10-CM | POA: Diagnosis not present

## 2023-04-05 DIAGNOSIS — Z21 Asymptomatic human immunodeficiency virus [HIV] infection status: Secondary | ICD-10-CM | POA: Diagnosis present

## 2023-04-05 MED ORDER — CLOBETASOL PROPIONATE 0.05 % EX CREA: 1.0000 | TOPICAL_CREAM | Freq: Two times a day (BID) | CUTANEOUS | 1 refills | Status: DC

## 2023-04-05 MED ORDER — TRIAMCINOLONE ACETONIDE 0.5 % EX OINT
TOPICAL_OINTMENT | CUTANEOUS | 11 refills | Status: DC
Start: 2023-04-05 — End: 2023-11-10

## 2023-04-05 NOTE — Assessment & Plan Note (Signed)
Unable to get tac cream from pharmacy - she wanted refills on clobetasol / triamcinolone today to help control itch - provided.  Unclear if this is brought on by HIV infection vs eczematous process. She has had persistent symptoms despite viral suppression. I am not sure this is directly related to dolutegravir ingredient and would prefer to continue this once daily with the addition of Dupixant injections.  I will reach out to Derm team to see if there is any update on PA.   I will plan to call her next week for further recommendations.

## 2023-04-05 NOTE — Patient Instructions (Addendum)
Happy Birthday!   I will call your dermatologist to speak with her about the injections and if they have been approved. I want to talk with her more about your rash also to see if she feels it is related to a drug reaction.   If not I think we should restart the dovato and see if the dupixant helps.   I will call you once I get all the information I need for you and if we need to switch your medication.      Lindaann Pascal!   J'appellerai votre dermatologue pour [employer] injections et si [employer] t approuves. Je veux galement lui parler davantage de votre ruption cutane pour voir si elle pense que cela est li  une raction mdicamenteuse.   Sinon, je pense que nous devrions Sport and exercise psychologist dovato et voir Therapist, sports.   Je vous appellerai une fois que j'aurai obtenu toutes les informations dont j'ai besoin pour vous et si nous Social worker de mdicament.

## 2023-04-05 NOTE — Progress Notes (Signed)
Name: Candace Jones  DOB: 1980/09/11 MRN: [id number] PCP: Gerre Scull, NP    Brief Narrative:  Candace Jones is a 42 y.o. 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     Subjective:   Chief Complaint  Patient presents with   Follow-up    Refill Ointments and creams to help with itching skin. Had reaction to something so she stopped any meds unrelated to rash and B20.      HPI: Candace Jones interpretor on the phone to facilitate the visit.  Candace Jones is here for HIV follow up care.  Has seen dermatology a few times - she is still suffering from itching and is waiting on an update from her insurance about Dupixant. She stopped taking her dovato and it has seemed to help the itching / rash calm down. She has not had an update as to whether the dupixant has been approved. The new creams were not approved from insurance and she is requesting a refill of her clobetasol and triamcinolone creams.   FU 10/18/22 with Dr. Teresa Coombs with Neurology - stopped keppra and doing well off that.   10/26/2022 - established with new primary care provider - Candace Pickle, NP     Review of Systems  Constitutional:  Negative for fever and unexpected weight change.  Gastrointestinal:  Negative for diarrhea, nausea and rectal pain.  Genitourinary:  Negative for menstrual problem, pelvic pain and urgency.  Skin:  Positive for color change and rash.  Psychiatric/Behavioral:  Negative for sleep disturbance.       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 90 capsule 1   dolutegravir-lamiVUDine (DOVATO) 50-300 MG tablet Take 1 tablet by mouth daily. 30 tablet 11   EPINEPHrine 0.3 mg/0.3 mL IJ SOAJ injection Inject 0.3 mg into the muscle as needed for anaphylaxis. 1 each 0   ferrous sulfate 325 (65 FE) MG tablet Take 1 tablet (325 mg total) by mouth daily. 90 tablet 1   Multiple Vitamins-Minerals (ONE DAILY MULTIVITAMIN WOMEN) TABS TAKE 1 TABLET BY MOUTH DAILY 90 tablet 3   clobetasol cream (TEMOVATE) 0.05 % Apply 1 Application topically in the morning and at bedtime. Apply to affected areas twice a day for two weeks alternating with pimecrolimus. Avoid applying to face, groin, and axilla. Use as directed. Long-term use can cause thinning of the skin. 30 g 1   triamcinolone ointment (KENALOG) 0.5 % APPLY TOPICALLY TO THE AFFECTED AREA TWICE DAILY 30 g 11   Dupilumab (DUPIXENT) 300 MG/2ML SOPN Inject 300 mg into the skin every 14 (fourteen) days. Starting at day 15 for maintenance. (Patient not taking: Reported on  04/05/2023) 4 mL 11   fluticasone (FLONASE) 50 MCG/ACT nasal spray Place 2 sprays into both nostrils daily. (Patient not taking: Reported on 10/26/2022) 16 g 6   hydrOXYzine (ATARAX) 25 MG tablet Take 1 tablet (25 mg total) by mouth every 6 (six) hours. (Patient not taking: Reported on 04/05/2023) 60 tablet 1   hydrOXYzine (VISTARIL) 25 MG capsule Take 1 capsule (25 mg total) by mouth every 8 (eight) hours as needed. (Patient not taking: Reported on 04/05/2023) 30 capsule 1   Olopatadine HCl (PATADAY) 0.7 % SOLN Use 1 drop in each eye daily (Patient not taking: Reported on  04/05/2023) 2.5 mL 0   pimecrolimus (ELIDEL) 1 % cream Apply topically 2 (two) times daily. For 2 weeks, alternating every 2 weeks with clobetasol (Patient not taking: Reported on 04/05/2023) 60 g 2   predniSONE (DELTASONE) 20 MG tablet 2 tabs po daily x 4 days (Patient not taking: Reported on 04/05/2023) 8 tablet 0   tacrolimus (PROTOPIC) 0.1 % ointment Apply topically 2 (two) times daily. Apply to affected areas twice daily (Patient not taking: Reported on 04/05/2023) 100 g 3   No facility-administered medications prior to visit.     Allergies  Allergen Reactions   Tenofovir Other (See Comments)    Patient started on Biktarvy - acute renal failure shortly after with creatinine 5.2. Unclear if related to TAF but would approach with caution if re-trialed.     Social History   Tobacco Use   Smoking status: Never    Passive exposure: Never   Smokeless tobacco: Never  Vaping Use   Vaping status: Never Used  Substance Use Topics   Alcohol use: Never   Drug use: Never    Family History  Problem Relation Age of Onset   Breast cancer Neg Hx     Social History   Substance and Sexual Activity  Sexual Activity Yes   Birth control/protection: None   Comment: accepted condoms     Objective:   Vitals:   04/05/23 0924  BP: 108/73  Pulse: 81  Weight: 205 lb (93 kg)  Height: 5\' 8"  (1.727 m)   Body mass index is 31.17 kg/m.  Physical Exam Constitutional:      Appearance: Normal appearance. She is not ill-appearing.  HENT:     Mouth/Throat:     Mouth: Mucous membranes are moist.     Pharynx: Oropharynx is clear.  Eyes:     General: No scleral icterus. Cardiovascular:     Rate and Rhythm: Normal rate and regular rhythm.  Pulmonary:     Effort: Pulmonary effort is normal.  Skin:    Findings: Rash (hyperpigmented scaled patches/papules noted all over body.s) present.  Neurological:     Mental Status: She is oriented to person, place, and time.  Psychiatric:         Mood and Affect: Mood normal.        Thought Content: Thought content normal.      Lab Results Lab Results  Component Value Date   WBC 3.7 (L) 01/10/2023   HGB 10.8 (L) 01/10/2023   HCT 34.7 (L) 01/10/2023   MCV 79.4 01/10/2023   PLT 147.0 (L) 01/10/2023    Lab Results  Component Value Date   CREATININE 0.61 01/10/2023   BUN 14 01/10/2023   NA 135 01/10/2023   K 3.8 01/10/2023   CL 105 01/10/2023   CO2 25 01/10/2023    Lab Results  Component Value Date   ALT 12 01/10/2023  AST 17 01/10/2023   ALKPHOS 67 01/10/2023   BILITOT 0.5 01/10/2023    Lab Results  Component Value Date   CHOL 123 01/07/2021   HDL 55 01/07/2021   LDLCALC 51 01/07/2021   TRIG 85 01/07/2021   CHOLHDL 2.2 01/07/2021   HIV 1 RNA Quant (Copies/mL)  Date Value  12/09/2022 <20 (H)  05/27/2022 Not Detected  01/05/2022 Not Detected   CD4 T Cell Abs (/uL)  Date Value  12/09/2022 145 (L)  05/27/2022 268 (L)  01/05/2022 169 (L)     Assessment & Plan:   Problem List Items Addressed This Visit       High   Asymptomatic HIV infection, CD4 >200 and <500 (HCC) - Primary    She stopped her dovato out of investigation of her rash. I don't suspect this is a drug rash but will reach out to Derm MD to see their opinion. I will check HLA lab today to see if abacavir is an option given her ARF on TAF in the past (otherwise unexplained). Considering switch of class to Pifeltro + Epzicom taken together vs Dupixant + continue Dovato.   Will update VL, CD4 today. FU pending discussion with derm and lab results from today.        Relevant Orders   HLA B*5701   HIV 1 RNA quant-no reflex-bld   T-helper cells (CD4) count   QuantiFERON-TB Gold Plus     Unprioritized   Prurigo nodularis    Unable to get tac cream from pharmacy - she wanted refills on clobetasol / triamcinolone today to help control itch - provided.  Unclear if this is brought on by HIV infection vs eczematous process. She has had  persistent symptoms despite viral suppression. I am not sure this is directly related to dolutegravir ingredient and would prefer to continue this once daily with the addition of Dupixant injections.  I will reach out to Derm team to see if there is any update on PA.   I will plan to call her next week for further recommendations.        Other Visit Diagnoses     Rash       Relevant Medications   triamcinolone ointment (KENALOG) 0.5 %      Return in about 3 months (around 07/06/2023).   Refused vaccinations today.   Rexene Alberts, MSN, NP-C [facility] Infectious Disease [facility] Pager: [phone] Office: [phone]  04/05/23

## 2023-04-05 NOTE — Assessment & Plan Note (Signed)
She stopped her dovato out of investigation of her rash. I don't suspect this is a drug rash but will reach out to Derm MD to see their opinion. I will check HLA lab today to see if abacavir is an option given her ARF on TAF in the past (otherwise unexplained). Considering switch of class to Pifeltro + Epzicom taken together vs Dupixant + continue Dovato.   Will update VL, CD4 today. FU pending discussion with derm and lab results from today.

## 2023-04-06 ENCOUNTER — Telehealth: Payer: Self-pay

## 2023-04-06 LAB — T-HELPER CELLS (CD4) COUNT (NOT AT ARMC)
CD4 % Helper T Cell: 6 % — ABNORMAL LOW (ref 33–65)
CD4 T Cell Abs: 98 /uL — ABNORMAL LOW (ref 400–1790)

## 2023-04-06 NOTE — Telephone Encounter (Signed)
-----   Message from [place] sent at 04/06/2023 12:18 PM EDT ----- We need to restart her Bactrim prophylaxis.  One double strength tab 3x a week.  Hopeful to get a call back from her dermatologist soon and the other lab work I need. She may be leery to resume another medication with her rash but she hasn't been on this one in nearly a year and shouldn't impact it. 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  Needs [name] interpretor. Thank you!

## 2023-04-06 NOTE — Telephone Encounter (Signed)
Interpreter IDLenon Curt [id number]  Attempted to call patient. Not able to reach her at this time. Left voicemail requesting call back. Juanita Laster, RMA

## 2023-04-11 MED ORDER — SULFAMETHOXAZOLE-TRIMETHOPRIM 800-160 MG PO TABS: 1.0000 | ORAL_TABLET | ORAL | 5 refills | Status: DC

## 2023-04-11 NOTE — Addendum Note (Signed)
Addended by: Blanchard Kelch on: 04/11/2023 01:23 PM   Modules accepted: Orders

## 2023-04-11 NOTE — Telephone Encounter (Signed)
Patient advised of lab results and advised to start the Bactrim.  She has been advised to pick it up from the pharmacy and to also pick up her Dovato to restart.  I also stressed to her how important it is to take her Dovato as well. Ariellah Faust Jonathon Resides, CMA

## 2023-04-11 NOTE — Telephone Encounter (Signed)
Spoke to the patient to relay lab results. Patient stated she could not talk at the time and will call back

## 2023-04-11 NOTE — Telephone Encounter (Signed)
Attempted to call dermatology to see where the PA was in process for dupixant and to see if MD team thought strongly her rash was d/t drug reaction.   LVM to call back ID MD line

## 2023-04-11 NOTE — Telephone Encounter (Signed)
Received follow up call from Clydie Braun with Dermatology stating as of a week ago they are waiting on insurance to reply back regarding Dupixent. States that previously this was approved, but due to not being able to reach patient for delivery new authorization required. Provider from Derm did not think rash was drug reaction, but could be related to anemia.  Juanita Laster, RMA

## 2023-04-13 LAB — QUANTIFERON-TB GOLD PLUS
Mitogen-NIL: 3.34 [IU]/mL
NIL: 0.05 [IU]/mL
QuantiFERON-TB Gold Plus: NEGATIVE
TB1-NIL: <0 [IU]/mL
TB2-NIL: 0 [IU]/mL

## 2023-04-13 LAB — HIV-1 RNA QUANT-NO REFLEX-BLD
HIV 1 RNA Quant: 14100 {copies}/mL — ABNORMAL HIGH
HIV-1 RNA Quant, Log: 4.15 {Log_copies}/mL — ABNORMAL HIGH

## 2023-04-13 LAB — HLA B*5701: HLA-B*5701 w/rflx HLA-B High: NEGATIVE

## 2023-04-13 MED ORDER — ABACAVIR SULFATE-LAMIVUDINE 600-300 MG PO TABS: 1.0000 | ORAL_TABLET | Freq: Every day | ORAL | 5 refills | Status: DC

## 2023-04-13 MED ORDER — DORAVIRINE 100 MG PO TABS: 100.0000 mg | ORAL_TABLET | Freq: Every day | ORAL | 5 refills | Status: DC

## 2023-04-13 NOTE — Telephone Encounter (Signed)
Please call Candace Jones to let her know that we have her labs back.   New treatment for HIV will be two pills taken together once daily  Pifeltro + Epzicom   This is a different drug class from the Dovato. If she has persistent rash symptoms I think we can be more certain that the rash is NOT due to a drug reaction.   Can we please schedule a follow up with pharmacy in 47m to follow up on new regimen and recheck VL?   Many thanks

## 2023-04-13 NOTE — Telephone Encounter (Signed)
I spoke to the patient and advised her of the new regimen and advised her to make sure picks up the Bactrim as well.  Patient scheduled with pharmacy in a month.  Patient verbalized understanding.  Latavius Capizzi Jonathon Resides, CMA

## 2023-04-13 NOTE — Addendum Note (Signed)
Addended by: Blanchard Kelch on: 04/13/2023 02:56 PM   Modules accepted: Orders

## 2023-04-14 NOTE — Telephone Encounter (Signed)
Received call from Candace Jones, she went to the pharmacy yesterday to pick up her medications and they were not ready. She went again today and they were still not ready.   Called [facility], they had to order Pifeltro and anticipate they will have it in stock tomorrow. Called Jyasia back and let her know.  Sandie Ano, RN

## 2023-05-02 ENCOUNTER — Encounter: Payer: Medicaid Other | Admitting: Nurse Practitioner

## 2023-05-02 ENCOUNTER — Encounter: Payer: Self-pay | Admitting: Neurology

## 2023-05-02 ENCOUNTER — Telehealth: Payer: Self-pay | Admitting: Nurse Practitioner

## 2023-05-02 ENCOUNTER — Ambulatory Visit: Payer: Medicaid Other | Admitting: Neurology

## 2023-05-02 NOTE — Telephone Encounter (Signed)
11.18.24 no show/no letter sent/medicaid

## 2023-05-03 NOTE — Telephone Encounter (Signed)
1st no show w/in 12 months, letter sent via [facility]

## 2023-05-04 NOTE — Telephone Encounter (Signed)
Noted  

## 2023-05-17 NOTE — Progress Notes (Unsigned)
HPI: Candace Jones is a 42 y.o. female who presents to the [facility] pharmacy clinic for HIV follow-up.  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 WITH PIFELTRO   CHOLECALCIFEROL (VITAMIN D3) 50 MCG (2000 UT) CAPSULE    Take 1 capsule (2,000 Units total) by mouth daily.   CLOBETASOL CREAM (TEMOVATE) 0.05 %    Apply 1 Application topically in the morning and at bedtime. Apply to affected areas twice a day for two weeks alternating with pimecrolimus. Avoid applying to face, groin, and axilla. Use as directed. Long-term use can cause thinning of the skin.   DORAVIRINE (PIFELTRO) 100 MG TABS TABLET    Take 1 tablet (100 mg total) by mouth daily. WITH EPZICOM   DUPILUMAB (DUPIXENT) 300 MG/2ML SOPN    Inject 300 mg into the skin every 14 (fourteen) days. Starting at day 15 for maintenance.   EPINEPHRINE 0.3 MG/0.3 ML IJ SOAJ INJECTION    Inject 0.3 mg into the muscle as needed for anaphylaxis.   FERROUS SULFATE 325 (65 FE) MG TABLET    Take 1 tablet (325 mg total) by mouth daily.   FLUTICASONE (FLONASE) 50 MCG/ACT NASAL SPRAY    Place 2 sprays into both nostrils daily.   HYDROXYZINE (ATARAX) 25 MG TABLET    Take 1 tablet (25 mg total) by mouth every 6 (six)  hours.   HYDROXYZINE (VISTARIL) 25 MG CAPSULE    Take 1 capsule (25 mg total) by mouth every 8 (eight) hours as needed.   OLOPATADINE HCL (PATADAY) 0.7 % SOLN    Use 1 drop in each eye daily   PIMECROLIMUS (ELIDEL) 1 % CREAM    Apply topically 2 (two) times daily. For 2 weeks, alternating every 2 weeks with clobetasol   PREDNISONE (DELTASONE) 20 MG TABLET    2 tabs po daily x 4 days   SULFAMETHOXAZOLE-TRIMETHOPRIM (BACTRIM DS) 800-160 MG TABLET    Take 1 tablet by mouth 3 (three) times a week.   TACROLIMUS (PROTOPIC) 0.1 % OINTMENT    Apply topically 2 (two) times daily. Apply to affected areas twice daily   TRIAMCINOLONE OINTMENT (KENALOG) 0.5 %    APPLY TOPICALLY TO THE AFFECTED AREA TWICE DAILY  Modified Medications   No medications on file  Discontinued Medications   No medications on file    Allergies: Allergies  Allergen Reactions   Tenofovir Other (See Comments)    Patient started on Biktarvy - acute renal failure shortly after with creatinine 5.2. Unclear if related to TAF but would approach with caution if re-trialed.     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   Urinary incontinence 09/02/2021    Social History: Social History   Socioeconomic History   Marital status: Single    Spouse name: Not on file   Number of children: Not on file   Years of education: Not on file   Highest education level: Not on file  Occupational History   Not on file  Tobacco Use   Smoking status: Never    Passive exposure: Never   Smokeless tobacco: Never  Vaping Use   Vaping status: Never Used  Substance and Sexual Activity   Alcohol use: Never   Drug use: Never   Sexual activity: Yes    Birth  control/protection: None    Comment: accepted condoms  Other Topics Concern   Not on file  Social History Narrative   Lives at home with daughter.  Working:  not working since April 2022 since surgery.  Education:  HS in [place].  Speaks [name].   Social Determinants of Health   Financial Resource Strain: Not on file  Food Insecurity: Not on file  Transportation Needs: Not on file  Physical Activity: Not on file  Stress: Not on file  Social Connections: Not on file    Labs: Lab Results  Component Value Date   HIV1RNAQUANT 14,100 (H) 04/05/2023   HIV1RNAQUANT <20 (H) 12/09/2022   HIV1RNAQUANT Not Detected 05/27/2022   CD4TABS 98 (L) 04/05/2023   CD4TABS 145 (L) 12/09/2022   CD4TABS 268 (L) 05/27/2022    RPR and STI Lab Results  Component Value Date   LABRPR NON-REACTIVE 09/02/2021   LABRPR Reactive (A) 09/30/2020    STI Results GC CT  09/02/2021  9:31 AM Negative  Negative   11/25/2020  9:04 AM Negative  Negative     Hepatitis B Lab Results  Component Value Date   HEPBSAG NON REACTIVE 10/08/2020   HEPBCAB NON REACTIVE 10/08/2020   Hepatitis C No results found for: "HEPCAB", "HCVRNAPCRQN" Hepatitis A Lab Results  Component Value Date   HAV Reactive (A) 10/08/2020   Lipids: Lab Results  Component Value Date   CHOL 123 01/07/2021   TRIG 85 01/07/2021   HDL 55 01/07/2021   CHOLHDL 2.2 01/07/2021   LDLCALC 51 01/07/2021    Current HIV Regimen: Pifeltro/Epzicom  Assessment: Candace Jones presents to clinic today with an in-person [name] interpreter for HIV follow-up. She recently transitioned from Dovato to Pifeltro/Epizcom per Judeth Cornfield due to rash development. Notably, her CD4 count dropped to 98 and viral load increased from undetectable (since 2022) to 14,100. She states she has been taking Pifletro alone for one month and ran out yesterday. States she did not take the Epzicom because she did not know what it was for. Reviewed that we have limited options  for treatment given concerns over prior acute renal failure after starting a tenofovir-based regimen. Discussed that her current combination does not come as a single tablet regimen and that she must take both together for effective viral suppression. She verbalized understanding of this. She became visibly upset about filling her Pifletro again today because she did not want to wait at the pharmacy for the refill and because I would not send more Pifeltro in. Reviewed that she has 6 months of medicine at [facility] right now and the importance of filling her medications ahead  of time so she will not have to wait.   She notes improvement in her rash since discontinuing Dovato. States the rash is now limited to her arms, legs, and torso. The rash is only itchy with "bumps" on her right arm. The other areas merely display scars from the previous rash. Clobetasol and triamcinolone only provide momentary and mild relief for her, but she still asks for larger tube of clobetasol cream so it will last longer. Judeth Cornfield approved this, so I sent in a prescription of a 60 g tube rather than 30 g. Judeth Cornfield recommended transitioning back to Dovato given limited evidence it caused this rash and ongoing concerns with non-adherence to Pifeltro/Epzicom. Jadis wanted to trial Epzicom with Pifeltro and refused to restart Dovato. Judeth Cornfield also encouraged Candace Jones to call her dermatology office for further rash management as she also has eczema; provided her with their address and phone number.  Requested we assess a viral load with genotype today given she has been taking Pifeltro monotherapy for one month. She initially refused any lab work but then agreed to collecting the amount needed for this genotype. After her appointment, the lab staff informed me that she had refused labs. Will need to check when she follows up with Judeth Cornfield next month.   Candace Jones states she filled her Bactrim in October but never started taking it because  she did not know what it was for. Re-explained importance of preventing opportunistic infections while her CD4 count is recovering; she verbalized understanding. Of note, she states she has felt dizzy more lately and has lost ~15 pounds since October.   Due for flu, COVID, and hepatitis B vaccines which were all deferred today.   Plan: - Continue Pifeltro; START Epzicom  - Resend clobetasol 60 gram tube  - Check HIV RNA with genotype (deferred until next visit) - Follow up with Judeth Cornfield on 06/21/2023  Margarite Gouge, PharmD, CPP, BCIDP, AAHIVP Clinical Pharmacist Practitioner Infectious Diseases Clinical Pharmacist [facility] 05/17/2023, 4:39 PM

## 2023-05-18 ENCOUNTER — Ambulatory Visit (INDEPENDENT_AMBULATORY_CARE_PROVIDER_SITE_OTHER): Payer: Medicaid Other | Admitting: Pharmacist

## 2023-05-18 ENCOUNTER — Other Ambulatory Visit: Payer: Self-pay

## 2023-05-18 VITALS — Wt 191.4 lb

## 2023-05-18 DIAGNOSIS — Z21 Asymptomatic human immunodeficiency virus [HIV] infection status: Secondary | ICD-10-CM | POA: Diagnosis not present

## 2023-05-18 DIAGNOSIS — L281 Prurigo nodularis: Secondary | ICD-10-CM | POA: Diagnosis not present

## 2023-05-18 MED ORDER — CLOBETASOL PROPIONATE 0.05 % EX CREA: 1.0000 | TOPICAL_CREAM | Freq: Two times a day (BID) | CUTANEOUS | 1 refills | Status: AC

## 2023-05-18 NOTE — Patient Instructions (Addendum)
Langston Reusing, DO [facility] - Phone: [phone]  [address] [place], [place] [address]

## 2023-05-30 ENCOUNTER — Telehealth: Payer: Self-pay

## 2023-05-30 NOTE — Telephone Encounter (Signed)
Detectable Viral Load Intervention (DVL)  Most recent VL:  HIV 1 RNA Quant  Date Value Ref Range Status  04/05/2023 14,100 (H) Copies/mL Final  12/09/2022 <20 (H) Copies/mL Final    Comment:    HIV-1 RNA Detected  05/27/2022 Not Detected Copies/mL Final    Last Clinic Visit:  06/21/23  Current ART regimen: Pifeltro/Epizcom   Appointment status: patient does not have future appointment scheduled   Medication last dispensed (per chart review):   Medication Adherence   What pharmacy do you use for your ART? [facility]  Do you pick up your medication at the pharmacy or is it mailed to you? pick up at pharmacy  How often do you miss a dose your ART?   Are you experiencing any side effects with your ART?   Are you having any trouble remembering what medication(s) you are supposed to take or how you are supposed to take them?   What helps you remember to take your medication(s)?    Barriers to Care   Lack of transportation to medical appointments?   2. Housing instability?   3. If you are currently employed, are you having difficulty taking time off of work for medical appointments?   4. Financial concerns (rent, utilities, etc.)   5. Lack of consistent access to food?   6. Trouble remembering and attending your appointments?   7. Are you experiencing any other barriers that make it hard for you to come to appointments or take medication regularly?    Interventions   Called patient to discuss medication adherence and possible barriers to care. Not able to assess.  InterpreterLeonor Liv [phone]

## 2023-06-09 ENCOUNTER — Other Ambulatory Visit: Payer: Self-pay | Admitting: Family Medicine

## 2023-06-09 DIAGNOSIS — H04203 Unspecified epiphora, bilateral lacrimal glands: Secondary | ICD-10-CM

## 2023-06-09 DIAGNOSIS — H579 Unspecified disorder of eye and adnexa: Secondary | ICD-10-CM

## 2023-06-09 NOTE — Telephone Encounter (Signed)
Patient needs appointment for future refills.   LOV: 01/10/2023

## 2023-06-21 ENCOUNTER — Ambulatory Visit: Payer: Medicaid Other | Admitting: Infectious Diseases

## 2023-11-10 ENCOUNTER — Other Ambulatory Visit: Payer: Self-pay

## 2023-11-10 ENCOUNTER — Encounter: Payer: Self-pay | Admitting: Infectious Diseases

## 2023-11-10 ENCOUNTER — Ambulatory Visit (INDEPENDENT_AMBULATORY_CARE_PROVIDER_SITE_OTHER): Admitting: Infectious Diseases

## 2023-11-10 VITALS — BP 128/85 | HR 81 | Temp 97.1°F | Wt 200.0 lb

## 2023-11-10 DIAGNOSIS — H538 Other visual disturbances: Secondary | ICD-10-CM | POA: Diagnosis not present

## 2023-11-10 DIAGNOSIS — L281 Prurigo nodularis: Secondary | ICD-10-CM | POA: Diagnosis not present

## 2023-11-10 DIAGNOSIS — B20 Human immunodeficiency virus [HIV] disease: Secondary | ICD-10-CM

## 2023-11-10 DIAGNOSIS — R21 Rash and other nonspecific skin eruption: Secondary | ICD-10-CM

## 2023-11-10 DIAGNOSIS — Z21 Asymptomatic human immunodeficiency virus [HIV] infection status: Secondary | ICD-10-CM | POA: Diagnosis present

## 2023-11-10 MED ORDER — TRIAMCINOLONE ACETONIDE 0.5 % EX CREA
1.0000 | TOPICAL_CREAM | Freq: Three times a day (TID) | CUTANEOUS | 11 refills | Status: DC
Start: 2023-11-10 — End: 2023-12-12

## 2023-11-10 MED ORDER — DOVATO 50-300 MG PO TABS: 1.0000 | ORAL_TABLET | Freq: Every day | ORAL | 11 refills | Status: AC

## 2023-11-10 MED ORDER — SULFAMETHOXAZOLE-TRIMETHOPRIM 800-160 MG PO TABS
1.0000 | ORAL_TABLET | ORAL | 2 refills | Status: AC
Start: 2023-11-11 — End: ?

## 2023-11-10 NOTE — Patient Instructions (Addendum)
 Please start taking your DOVATO  every day  Please start the Bactrim  (Trimethoprim -Sulfamethoxazole ) 1 tablet every Monday, Wednesday and Friday.   Will refill your triamcinolone  cream in a large jar to help with the itching.   [facility] [address] [place],  [place]  [address] Main: [phone]  I called to schedule an appointment and left an appointment to reach you and us  about scheduling an appointment to help follow up.   For the eye exam -  We have also left a message for the eye doctor and will help to coordinate a vision exam for you. I think you need glasses.   [facility]  Address: [address] [place], [place], [place] [address] Phone: [phone]

## 2023-11-10 NOTE — Progress Notes (Signed)
 Name: Candace Jones  DOB: May 24, 1981 MRN: [id number] PCP: Odette Benjamin, NP    Brief Narrative:  Candace Jones is a 43 y.o. 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     Subjective:   Chief Complaint  Patient presents with   Follow-up    Still has itchy rash, reports she's not taking any medications right now     Discussed the use of AI scribe software for clinical note transcription with the patient, who gave verbal consent to proceed.  History of Present Illness   Candace Jones is a 43 year old female who presents for medication management   She has a history of a severe brain infection and has been off her medication for a couple of months, raising concerns about her immune system's vulnerability.  She has a persistent rash ongoing since we last saw her affecting her back and other areas. Previous attempts to manage the rash with steroid creams have only treated the symptoms without resolving the underlying issue. Insurance coverage issues have previously limited her access to dermatological treatments.  She experiences vision changes, specifically blurry vision, which affects her ability to read small print, although she can still drive during the day. No weight loss or headaches.         11/10/2023    1:22 PM 04/05/2023    9:22 AM 01/10/2023    9:35 AM  Depression screen PHQ 2/9  Decreased Interest 0 0 0  Down, Depressed, Hopeless 0 0 0  PHQ - 2 Score 0 0 0  Altered sleeping 0    Tired, decreased energy 0    Change in appetite 0    Feeling bad or failure about yourself  0    Trouble concentrating 2    Moving slowly  or fidgety/restless 0    Suicidal thoughts 0    PHQ-9 Score 2    Difficult doing work/chores Somewhat difficult        11/10/2023    1:23 PM 05/24/2022    9:14 AM 01/07/2021   11:09 AM 11/25/2020    8:54 AM  GAD 7 : Generalized Anxiety Score  Nervous, Anxious, on Edge 0 0 0 1  Control/stop worrying 0 0 0 0  Worry too much - different things 0 0 0   Trouble relaxing 0 0 0 0  Restless 0 0 0 0  Easily annoyed or irritable 0 0 0   Afraid - awful might happen 0 0 0   Total GAD 7 Score 0 0 0   Anxiety Difficulty  Not difficult at all         Review of Systems  Constitutional:  Negative for fever and unexpected weight change.  Gastrointestinal:  Negative for diarrhea, nausea and rectal pain.  Genitourinary:  Negative for menstrual problem, pelvic pain and urgency.  Skin:  Positive for color change and rash.  Psychiatric/Behavioral:  Negative for sleep disturbance.       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 90 capsule 1   clobetasol  cream (TEMOVATE ) 0.05 % Apply 1 Application topically in the morning and at bedtime. Apply to affected areas twice a day for two weeks alternating with pimecrolimus . Avoid applying to face, groin, and axilla. Use as directed. Long-term use can cause thinning of the skin. 60 g 1   Dupilumab  (DUPIXENT ) 300 MG/2ML SOPN Inject 300 mg into the skin every 14 (fourteen) days. Starting at day 15 for maintenance. (Patient not taking: Reported on 04/05/2023) 4 mL 11   EPINEPHrine  0.3  mg/0.3 mL IJ SOAJ injection Inject 0.3 mg into the muscle as needed for anaphylaxis. 1 each 0   fluticasone  (FLONASE ) 50 MCG/ACT nasal spray SHAKE LIQUID AND USE 2 SPRAYS IN EACH NOSTRIL DAILY 16 g 0   hydrOXYzine  (ATARAX ) 25 MG tablet Take 1 tablet (25 mg total) by mouth every 6 (six) hours. (Patient not taking: Reported on 04/05/2023) 60 tablet 1   hydrOXYzine  (VISTARIL ) 25 MG capsule Take 1 capsule (25 mg total) by mouth every 8 (eight) hours as needed. (Patient not taking: Reported on 04/05/2023) 30 capsule 1   Olopatadine  HCl (PATADAY ) 0.7 % SOLN Use 1 drop in each eye daily (Patient not taking: Reported on 04/05/2023) 2.5 mL 0   pimecrolimus  (ELIDEL ) 1 % cream Apply topically 2 (two) times daily. For 2 weeks, alternating every 2 weeks with clobetasol  (Patient not taking: Reported on 04/05/2023) 60 g 2   tacrolimus  (PROTOPIC ) 0.1 % ointment Apply topically 2 (two) times daily. Apply to affected areas twice daily (Patient not taking: Reported on 04/05/2023) 100 g 3   abacavir -lamiVUDine  (EPZICOM ) 600-300 MG tablet Take 1 tablet by mouth daily. WITH PIFELTRO  30 tablet 5   doravirine  (PIFELTRO ) 100 MG TABS tablet Take 1 tablet (100 mg total) by mouth daily. WITH EPZICOM  30 tablet 5   ferrous sulfate  325 (65 FE) MG tablet Take 1 tablet (325 mg total) by mouth daily. 90 tablet 1   predniSONE  (DELTASONE ) 20 MG tablet 2 tabs po daily x 4 days (Patient not taking: Reported on 04/05/2023) 8 tablet 0   sulfamethoxazole -trimethoprim  (BACTRIM  DS) 800-160 MG tablet Take 1 tablet by mouth 3 (three) times a week. 30 tablet 5   triamcinolone  ointment (KENALOG ) 0.5 % APPLY TOPICALLY TO THE AFFECTED AREA TWICE DAILY 30 g 11   No facility-administered medications prior to visit.     Allergies  Allergen Reactions   Tenofovir Other (See Comments)    Patient started on Biktarvy  - acute renal failure shortly after with creatinine 5.2. Unclear if related to TAF but would approach with caution if re-trialed.      Social History   Tobacco Use   Smoking status: Never    Passive exposure: Never   Smokeless tobacco: Never  Vaping Use   Vaping status: Never Used  Substance Use Topics   Alcohol use: Never   Drug use: Never    Social History   Substance and Sexual Activity  Sexual Activity Yes   Birth control/protection: None     Objective:   Vitals:   11/10/23 1314  BP: 128/85  Pulse: 81  Temp: (!) 97.1  F (36.2 C)  TempSrc: Temporal  SpO2: 100%  Weight: 200 lb (90.7 kg)   Body mass index is 30.41 kg/m.  Physical Exam Constitutional:      Appearance: Normal appearance. She is not ill-appearing.  HENT:     Mouth/Throat:     Mouth: Mucous membranes are moist.     Pharynx: Oropharynx is clear.  Eyes:     General: No scleral icterus. Cardiovascular:     Rate and Rhythm: Normal rate and regular rhythm.  Pulmonary:     Effort: Pulmonary effort is normal.  Skin:    Findings: Rash (hyperpigmented scaled patches/papules noted all over body.s) present.  Neurological:     Mental Status: She is oriented to person, place, and time.  Psychiatric:        Mood and Affect: Mood normal.        Thought Content: Thought content normal.      Lab Results Lab Results  Component Value Date   WBC 3.7 (L) 01/10/2023   HGB 10.8 (L) 01/10/2023   HCT 34.7 (L) 01/10/2023   MCV 79.4 01/10/2023   PLT 147.0 (L) 01/10/2023    Lab Results  Component Value Date   CREATININE 0.61 01/10/2023   BUN 14 01/10/2023   NA 135 01/10/2023   K 3.8 01/10/2023   CL 105 01/10/2023   CO2 25 01/10/2023    Lab Results  Component Value Date   ALT 12 01/10/2023   AST 17 01/10/2023   ALKPHOS 67 01/10/2023   BILITOT 0.5 01/10/2023    Lab Results  Component Value Date   CHOL 123 01/07/2021   HDL 55 01/07/2021   LDLCALC 51 01/07/2021   TRIG 85 01/07/2021   CHOLHDL 2.2 01/07/2021   HIV 1 RNA Quant (Copies/mL)  Date Value  04/05/2023 14,100 (H)  12/09/2022 <20 (H)  05/27/2022 Not Detected    CD4 T Cell Abs (/uL)  Date Value  04/05/2023 98 (L)  12/09/2022 145 (L)  05/27/2022 268 (L)     Assessment & Plan:     HIV Infection -  Off Treatment since 2024, CD4 95 -  H/O CNS Toxoplasmosis -  I called her earlier this week to come in to be seen since we have not seen her in some time. As i was concerned, she has been off ART for many months now. Fortunately, she has not had any symptoms of relapsing toxoplasmosis. Will go ahead and prescribe prophylactic bactrim .  She understands now that her HIV medication has nothing to do with her pruritic rash.  She is OK to resume Dovato  once daily. We will need to check a genotype given last known information she was taking only pifeltro  once daily.  - Restart Dovato  once daily. - Prescribe antibiotic prophylaxis on Monday, Wednesday, and Friday to prevent brain infection during immune recovery. - Genotype today as well as CD4, CMP.   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 Previous steroid creams do help a little but insurance only covers certain creams. Will send in a jar of Triamcinolone  .5% with Eucerine lotion to use. She showed me a picture of retin A her daughter has used but I don't think that's going to help. She understands now that this rash has nothing to do with her ART and, in my opinion has worsened in the time she has been off her medications.  I personally called dermatology office and left a message in an effort to coordinate care for her in follow  up. Per Medicaid Formulary it does appear that Dupixent  is on formulary so hopefully that will be accessible to her if still recommended.  Dermatological consultation needed for alternative treatments. - HIV ART to get this back under better control.  - Her CD4 count will take a long time to recover I anticipate....  - No eosinophilia, GI or lung symptoms to suggest Strongyloides, Schistosomiasis - Refer to dermatologist for further evaluation and  management. - Prescribe triamcinolone  cream for symptomatic relief. - Emollients and barrier repair to reduce xerosis discussed, Dove Sensitive Skin Soap, Eucerin Cream Recommended.  - Avoid scratching - Personal message sent to Dr. Myrtie Atkinson and team at [facility].  - Resume ART in the event this has worsened since her HIV has become uncontrolled again  - Could consider gabapentin at nigh to help with neuropathic itching  Blurry vision - Blurry vision with difficulty reading small print, but adequate for daytime driving. Has some haloing from lights at night. Possible need for corrective lenses. I personally reached out to leave a message with [facility]. Will place formal referral to see if our coordinator can help given language barrier.  - Refer to [facility] for ophthalmological evaluation.       Return in about 1 month (around 12/11/2023).   Total Encounter Time: 53 minutes including 28 spent in direct face to face time and the rest spent in reviewing pertinent ID notes, labs and other medical encounters since October 2024, Dermatology specialist notes, and time spent to personally coordinate appointments with other offices.   Gibson Kurtz, MSN, NP-C [facility] for Infectious Disease [facility] Pager: [phone] Office: [phone]  11/10/23

## 2023-11-11 ENCOUNTER — Telehealth: Payer: Self-pay | Admitting: Dermatology

## 2023-11-11 NOTE — Telephone Encounter (Signed)
 Called pt to sch appt with Dr. Myrtie Atkinson, used interpreter services, no answer. Left vm for pt to call back to sch appt

## 2023-11-15 ENCOUNTER — Ambulatory Visit: Payer: Self-pay | Admitting: Infectious Diseases

## 2023-11-15 NOTE — Telephone Encounter (Signed)
-----   Message from [place] sent at 11/15/2023 10:15 AM EDT ----- Please try to reach Candace Jones with [name] [facility] to let her know:  Her immune system has declined again like I worried about when I saw her - her CD4 is only 58. She will need that Bactrim  prevention for many months so please continue taking the Bactrim  antibiotic with the Dovato .   The dermatology office has tried to reach her to have her back for her rash - she NEEDS to go to this appointment as I have nothing else to offer her for help with the itching. Please encourage her to call them back to set up an appointment at Phone: [phone]  It would be great if she had a case manager to help with coordinating some of these appointments.   Thank you

## 2023-11-15 NOTE — Progress Notes (Signed)
 Please try to reach Candace Jones with [name] to let her know:  Her immune system has declined again like I worried about when I saw her - her CD4 is only 58. She will need that Bactrim  prevention for many months so please continue taking the Bactrim  antibiotic with the Dovato .   The dermatology office has tried to reach her to have her back for her rash - she NEEDS to go to this appointment as I have nothing else to offer her for help with the itching. Please encourage her to call them back to set up an appointment at Phone: [phone]  It would be great if she had a case manager to help with coordinating some of these appointments.   Thank you

## 2023-11-15 NOTE — Telephone Encounter (Signed)
 Was able to reach to patient regarding results/ dermatology contact info. Patient did not have any questions at this time. Understands to continue Bactrim  while immune system builds up and continue Dovato  every day.  Provided patient with contact info for Dermatology. Will call them to setup appt.  Julien Odor, RMA

## 2023-11-23 LAB — HIV-1 INTEGRASE GENOTYPE

## 2023-11-23 LAB — T-HELPER CELLS (CD4) COUNT (NOT AT ARMC)
Absolute CD4: 58 {cells}/uL — ABNORMAL LOW (ref 490–1740)
CD4 T Helper %: 5 % — ABNORMAL LOW (ref 30–61)
Total lymphocyte count: 1144 {cells}/uL (ref 850–3900)

## 2023-11-23 LAB — COMPLETE METABOLIC PANEL WITHOUT GFR
AG Ratio: 0.7 (calc) — ABNORMAL LOW (ref 1.0–2.5)
ALT: 8 U/L (ref 6–29)
AST: 17 U/L (ref 10–30)
Albumin: 3.5 g/dL — ABNORMAL LOW (ref 3.6–5.1)
Alkaline phosphatase (APISO): 49 U/L (ref 31–125)
BUN: 19 mg/dL (ref 7–25)
CO2: 24 mmol/L (ref 20–32)
Calcium: 8.7 mg/dL (ref 8.6–10.2)
Chloride: 108 mmol/L (ref 98–110)
Creat: 0.71 mg/dL (ref 0.50–0.99)
Globulin: 4.9 g/dL — ABNORMAL HIGH (ref 1.9–3.7)
Glucose, Bld: 83 mg/dL (ref 65–99)
Potassium: 3.8 mmol/L (ref 3.5–5.3)
Sodium: 137 mmol/L (ref 135–146)
Total Bilirubin: 0.4 mg/dL (ref 0.2–1.2)
Total Protein: 8.4 g/dL — ABNORMAL HIGH (ref 6.1–8.1)

## 2023-11-23 LAB — HIV-1 GENOTYPE: HIV-1 Genotype: DETECTED — AB

## 2023-11-23 LAB — HIV RNA, RTPCR W/R GT (RTI, PI,INT)
HIV 1 RNA Quant: 28800 {copies}/mL — ABNORMAL HIGH
HIV-1 RNA Quant, Log: 4.46 {Log_copies}/mL — ABNORMAL HIGH

## 2023-12-12 ENCOUNTER — Ambulatory Visit (INDEPENDENT_AMBULATORY_CARE_PROVIDER_SITE_OTHER): Admitting: Infectious Diseases

## 2023-12-12 ENCOUNTER — Other Ambulatory Visit: Payer: Self-pay

## 2023-12-12 ENCOUNTER — Encounter: Payer: Self-pay | Admitting: Infectious Diseases

## 2023-12-12 DIAGNOSIS — B20 Human immunodeficiency virus [HIV] disease: Secondary | ICD-10-CM

## 2023-12-12 DIAGNOSIS — L281 Prurigo nodularis: Secondary | ICD-10-CM | POA: Diagnosis not present

## 2023-12-12 DIAGNOSIS — Z21 Asymptomatic human immunodeficiency virus [HIV] infection status: Secondary | ICD-10-CM

## 2023-12-12 MED ORDER — TRIAMCINOLONE ACETONIDE 0.5 % EX CREA
1.0000 | TOPICAL_CREAM | Freq: Three times a day (TID) | CUTANEOUS | 11 refills | Status: DC
Start: 2023-12-12 — End: 2023-12-12

## 2023-12-12 MED ORDER — TRIAMCINOLONE ACETONIDE 0.5 % EX CREA
1.0000 | TOPICAL_CREAM | Freq: Three times a day (TID) | CUTANEOUS | 11 refills | Status: AC
Start: 2023-12-12 — End: ?

## 2023-12-12 NOTE — Progress Notes (Signed)
 Name: Candace Jones  DOB: [birth year] MRN: [id number] PCP: Nedra Tinnie LAB[facility], NP    Brief Narrative:  Candace Jones is a 43 y.o. 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     Subjective:   No chief complaint on file.    Discussed the use of AI scribe software for clinical note transcription with the patient, who gave verbal consent to proceed.  History of Present Illness   Candace Jones is a 43 year old female with HIV who presents for a one month follow-up after resuming her HIV medication, Dovato .  She resumed her HIV medication, Dovato , one month ago. Her viral load was 28,800 copies after about 8 months + of being off her medication. Fortunately there were no significant mutations noted on her genotype, including integrase inhibitors. Her CD4 count has dropped to 58 which we discussed today.   She recently experienced lymphadenopathy around her throat which caused a sore throat and difficulty swallowing. She described a 'boul dan la goj' (lump in the throat) that persisted for two weeks. The symptoms have improved, although there is still some residual discomfort. Initially, she couldn't swallow any saliva, but now she feels better.  She is planning to move to [place]  to live with her sister, Candace Jones, on July 30th due to financial difficulties and not working. She expressed concerns about transferring her Medicaid and finding a local clinic in [place]  due to language barriers.         11/10/2023    1:22 PM 04/05/2023    9:22 AM 01/10/2023    9:35 AM  Depression screen PHQ 2/9  Decreased Interest 0 0 0  Down, Depressed, Hopeless 0 0  0  PHQ - 2 Score 0 0 0  Altered sleeping 0    Tired, decreased energy 0    Change in appetite 0    Feeling bad or failure about yourself  0    Trouble concentrating 2    Moving slowly or fidgety/restless 0    Suicidal thoughts 0    PHQ-9 Score 2    Difficult doing work/chores Somewhat difficult        11/10/2023    1:23 PM 05/24/2022    9:14 AM 01/07/2021   11:09 AM 11/25/2020    8:54 AM  GAD 7 : Generalized Anxiety Score  Nervous, Anxious, on Edge 0 0 0 1  Control/stop worrying 0 0 0 0  Worry too much - different things 0 0 0   Trouble relaxing 0 0 0 0  Restless 0 0 0 0  Easily annoyed or irritable 0 0 0   Afraid - awful might happen 0 0 0   Total GAD 7 Score 0 0 0   Anxiety Difficulty  Not difficult at all         Review of Systems  Constitutional:  Negative for fever and unexpected weight change.  Gastrointestinal:  Negative for diarrhea, nausea and rectal pain.  Genitourinary:  Negative for menstrual problem, pelvic pain and urgency.  Skin:  Positive for color change and rash.  Psychiatric/Behavioral:  Negative for sleep disturbance.  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 90 capsule 1   clobetasol  cream (TEMOVATE ) 0.05 % Apply 1 Application topically in the morning and at bedtime. Apply to affected areas twice a day for two weeks alternating with pimecrolimus . Avoid applying to face, groin, and axilla. Use as directed. Long-term use can cause thinning of the skin. 60 g 1    dolutegravir -lamiVUDine  (DOVATO ) 50-300 MG tablet Take 1 tablet by mouth daily. 30 tablet 11   Dupilumab  (DUPIXENT ) 300 MG/2ML SOPN Inject 300 mg into the skin every 14 (fourteen) days. Starting at day 15 for maintenance. (Patient not taking: Reported on 04/05/2023) 4 mL 11   EPINEPHrine  0.3 mg/0.3 mL IJ SOAJ injection Inject 0.3 mg into the muscle as needed for anaphylaxis. 1 each 0   fluticasone  (FLONASE ) 50 MCG/ACT nasal spray SHAKE LIQUID AND USE 2 SPRAYS IN EACH NOSTRIL DAILY 16 g 0   hydrOXYzine  (ATARAX ) 25 MG tablet Take 1 tablet (25 mg total) by mouth every 6 (six) hours. (Patient not taking: Reported on 04/05/2023) 60 tablet 1   hydrOXYzine  (VISTARIL ) 25 MG capsule Take 1 capsule (25 mg total) by mouth every 8 (eight) hours as needed. (Patient not taking: Reported on 04/05/2023) 30 capsule 1   Olopatadine  HCl (PATADAY ) 0.7 % SOLN Use 1 drop in each eye daily (Patient not taking: Reported on 04/05/2023) 2.5 mL 0   pimecrolimus  (ELIDEL ) 1 % cream Apply topically 2 (two) times daily. For 2 weeks, alternating every 2 weeks with clobetasol  (Patient not taking: Reported on 04/05/2023) 60 g 2   sulfamethoxazole -trimethoprim  (BACTRIM  DS) 800-160 MG tablet Take 1 tablet by mouth 3 (three) times a week. 30 tablet 2   tacrolimus  (PROTOPIC ) 0.1 % ointment Apply topically 2 (two) times daily. Apply to affected areas twice daily (Patient not taking: Reported on 04/05/2023) 100 g 3   triamcinolone  cream (KENALOG ) 0.5 % Apply 1 Application topically 3 (three) times daily. 454 g 11   No facility-administered medications prior to visit.     Allergies  Allergen Reactions   Tenofovir Other (See Comments)    Patient started on Biktarvy  - acute renal failure shortly after with creatinine 5.2. Unclear if related to TAF but would approach with caution if re-trialed.     Social History   Tobacco Use   Smoking status: Never    Passive exposure: Never   Smokeless tobacco: Never  Vaping Use   Vaping status:  Never Used  Substance Use Topics   Alcohol use: Never   Drug use: Never    Social History   Substance and Sexual Activity  Sexual Activity Yes   Birth control/protection: None     Objective:   There were no vitals filed for this visit.  There is no height or weight on file to calculate BMI.  Physical Exam Constitutional:      Appearance: Normal appearance. She is not ill-appearing.  HENT:     Mouth/Throat:     Mouth: Mucous membranes are moist.     Pharynx: Oropharynx  is clear.   Eyes:     General: No scleral icterus.   Cardiovascular:     Rate and Rhythm: Normal rate and regular rhythm.  Pulmonary:     Effort: Pulmonary effort is normal.   Skin:    Findings: Rash (hyperpigmented scaled patches/papules noted all over body.s) present.   Neurological:     Mental Status: She is oriented to person, place, and time.   Psychiatric:        Mood and Affect: Mood normal.        Thought Content: Thought content normal.     Lab Results Lab Results  Component Value Date   WBC 3.7 (L) 01/10/2023   HGB 10.8 (L) 01/10/2023   HCT 34.7 (L) 01/10/2023   MCV 79.4 01/10/2023   PLT 147.0 (L) 01/10/2023    Lab Results  Component Value Date   CREATININE 0.71 11/10/2023   BUN 19 11/10/2023   NA 137 11/10/2023   K 3.8 11/10/2023   CL 108 11/10/2023   CO2 24 11/10/2023    Lab Results  Component Value Date   ALT 8 11/10/2023   AST 17 11/10/2023   ALKPHOS 67 01/10/2023   BILITOT 0.4 11/10/2023    Lab Results  Component Value Date   CHOL 123 01/07/2021   HDL 55 01/07/2021   LDLCALC 51 01/07/2021   TRIG 85 01/07/2021   CHOLHDL 2.2 01/07/2021   HIV 1 RNA Quant  Date Value  11/10/2023 28,800 copies/mL (H)  04/05/2023 14,100 Copies/mL (H)  12/09/2022 <20 Copies/mL (H)   CD4 T Cell Abs (/uL)  Date Value  04/05/2023 98 (L)  12/09/2022 145 (L)  05/27/2022 268 (L)     Assessment & Plan:  Assessment and Plan    HIV infection - AIDS, CD4 58 -  H/O CNS  Toxoplasmosis -  HIV infection with recent high viral load and CD4 count drop to 58 after 8 months of stopping Dovato  (presumed it was contributing to her rash). No significant mutations on genotype, including integrase inhibitors. Emphasized medication adherence and immune system role in HIV management. Relocation to [place]  will complicate Medicaid and healthcare access with language barrier. She will need to continue bactrim  3x a week as well for prophylaxis  - Continue Dovato  once daily. - Continue bactrim  once daily.  - Provide medication samples for an additional month. - Arrange prescription refill before moving. - Assist in finding a local clinic in [place]  - infomration provided for the county's DHHS office today. Explained I cannot help it has to come from her inperson.  - Provide information on [place]  Medicaid application process.   Prurigo Nodularis -  I had arranged a follow up in November for consideration of Dupxient injections however now that she is moving will request them to cancel. Will refill her Triamcinolone  and again request a larger jar of it for her. Discussed that her poorly functioning immune system and viremia will certainly not help her condition. It was again highly encouraged she stay on her HIV treatment to ensure viral suppression and recovery of her immune system. Would prefer at least durable viral suppression and CD4 > 100 for consideration of dupixent .  - Refer to a dermatologist for itching, appointment scheduled for November. - Request the largest available container of prescribed cream.  Recording duration: 21 minutes      Meds ordered this encounter  Medications   DISCONTD: triamcinolone  cream (KENALOG ) 0.5 %    Sig: Apply 1 Application topically 3 (three) times  daily.    Dispense:  465 g    Refill:  11   triamcinolone  cream (KENALOG ) 0.5 %    Sig: Apply 1 Application topically 3 (three) times daily.    Dispense:  465 g    Refill:  11    Orders Placed This Encounter  Procedures   HIV-1 RNA quant-no reflex-bld   Ambulatory referral to Infectious Disease    Referral Priority:   Routine    Referral Type:   Consultation    Referral Reason:   Specialty Services Required    Requested Specialty:   Infectious Diseases    Number of Visits Requested:   1     No follow-ups on file. Referral coordinator notified to help with [facility] her sister is located. Address is correct in referral notes    Corean Fireman, MSN, NP-C [facility] Pager: [phone] Office: [phone]  12/12/23

## 2023-12-12 NOTE — Patient Instructions (Addendum)
   You will need to go to [facility] Department of Social Services to apply for [place]  Medicaid -  This is not something that we can help with unfortunately.   Address  [address] [place], [place] [address]  Phone: [phone] Fax: [phone]  Hours of Operation: M-F 8:00AM 4:30PM  Email: [email or web address]  [email or web address]

## 2023-12-15 ENCOUNTER — Other Ambulatory Visit: Payer: Self-pay | Admitting: Pharmacist

## 2023-12-15 DIAGNOSIS — Z21 Asymptomatic human immunodeficiency virus [HIV] infection status: Secondary | ICD-10-CM

## 2023-12-15 LAB — HIV-1 RNA QUANT-NO REFLEX-BLD
HIV 1 RNA Quant: NOT DETECTED {copies}/mL
HIV-1 RNA Quant, Log: NOT DETECTED {Log_copies}/mL

## 2023-12-15 MED ORDER — DOVATO 50-300 MG PO TABS: 1.0000 | ORAL_TABLET | Freq: Every day | ORAL | Status: AC

## 2023-12-15 NOTE — Progress Notes (Signed)
 Medication Samples have been provided to the patient.  Drug name: Dovato        Strength: 50/300 mg         Qty: 28  Tablets (2 bottles) LOT: [id number]   Exp.Date: 7/26  Samples requested by Gibson Kurtz, NP.  Dosing instructions: Take one tablet by mouth once daily  The patient has been instructed regarding the correct time, dose, and frequency of taking this medication, including desired effects and most common side effects.   Nicklas Barns, PharmD, CPP, BCIDP, AAHIVP Clinical Pharmacist Practitioner Infectious Diseases Clinical Pharmacist [facility]

## 2023-12-17 ENCOUNTER — Ambulatory Visit: Payer: Self-pay | Admitting: Infectious Diseases

## 2023-12-19 NOTE — Telephone Encounter (Signed)
 Called patient regarding result. No questions at this time. Requested one more appt before she moves on 7/30. Scheduled to see Dixon NP 6/28 as requested. Lorenda CHRISTELLA Code, RMA

## 2023-12-19 NOTE — Telephone Encounter (Signed)
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 She needs to continue the Dovato  everyday and the Bactrim  antibiotic until her immune  system gets stronger. 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   If she wants to make one more appointment before she moves in with her sister happy to help. If not, would see if she can fill her medication before she leaves so she has plenty and start the process  for [place]  [employer]icaid with the information I gave her last appt.  Thank you  ----- Message ----- From: Rebecka Hose Lab Results In Sent: 12/15/2023  12:12 PM EDT To: Corean LOISE Fireman, NP

## 2024-01-09 ENCOUNTER — Ambulatory Visit: Admitting: Infectious Diseases

## 2024-04-16 ENCOUNTER — Ambulatory Visit: Admitting: Physician Assistant
